# Patient Record
Sex: Male | Born: 1950 | Race: White | Hispanic: No | Marital: Married | Smoking: Never smoker
Health system: Southern US, Community
[De-identification: ages and names within clinical notes are randomized; demographics above are authoritative.]

## PROBLEM LIST (undated history)

## (undated) DIAGNOSIS — E7220 Disorder of urea cycle metabolism, unspecified: Secondary | ICD-10-CM

## (undated) DIAGNOSIS — L03116 Cellulitis of left lower limb: Secondary | ICD-10-CM

## (undated) DIAGNOSIS — I219 Acute myocardial infarction, unspecified: Secondary | ICD-10-CM

## (undated) DIAGNOSIS — E079 Disorder of thyroid, unspecified: Secondary | ICD-10-CM

## (undated) DIAGNOSIS — I209 Angina pectoris, unspecified: Secondary | ICD-10-CM

## (undated) DIAGNOSIS — K7460 Unspecified cirrhosis of liver: Secondary | ICD-10-CM

## (undated) DIAGNOSIS — G4733 Obstructive sleep apnea (adult) (pediatric): Secondary | ICD-10-CM

## (undated) DIAGNOSIS — R161 Splenomegaly, not elsewhere classified: Secondary | ICD-10-CM

## (undated) DIAGNOSIS — R351 Nocturia: Secondary | ICD-10-CM

## (undated) DIAGNOSIS — R011 Cardiac murmur, unspecified: Secondary | ICD-10-CM

## (undated) DIAGNOSIS — I2510 Atherosclerotic heart disease of native coronary artery without angina pectoris: Secondary | ICD-10-CM

## (undated) DIAGNOSIS — D696 Thrombocytopenia, unspecified: Secondary | ICD-10-CM

## (undated) DIAGNOSIS — N183 Chronic kidney disease, stage 3 unspecified: Secondary | ICD-10-CM

## (undated) DIAGNOSIS — M1990 Unspecified osteoarthritis, unspecified site: Secondary | ICD-10-CM

## (undated) DIAGNOSIS — M549 Dorsalgia, unspecified: Secondary | ICD-10-CM

## (undated) DIAGNOSIS — E871 Hypo-osmolality and hyponatremia: Secondary | ICD-10-CM

## (undated) DIAGNOSIS — I10 Essential (primary) hypertension: Secondary | ICD-10-CM

## (undated) DIAGNOSIS — E119 Type 2 diabetes mellitus without complications: Secondary | ICD-10-CM

## (undated) DIAGNOSIS — I509 Heart failure, unspecified: Secondary | ICD-10-CM

## (undated) DIAGNOSIS — E875 Hyperkalemia: Secondary | ICD-10-CM

## (undated) DIAGNOSIS — R799 Abnormal finding of blood chemistry, unspecified: Secondary | ICD-10-CM

## (undated) DIAGNOSIS — R7989 Other specified abnormal findings of blood chemistry: Secondary | ICD-10-CM

## (undated) DIAGNOSIS — K760 Fatty (change of) liver, not elsewhere classified: Secondary | ICD-10-CM

## (undated) HISTORY — DX: Unspecified cirrhosis of liver: K74.60

## (undated) HISTORY — DX: Splenomegaly, not elsewhere classified: R16.1

## (undated) HISTORY — DX: Type 2 diabetes mellitus without complications: E11.9

## (undated) HISTORY — PX: JOINT REPLACEMENT: SHX530

## (undated) HISTORY — DX: Fatty (change of) liver, not elsewhere classified: K76.0

## (undated) HISTORY — DX: Disorder of thyroid, unspecified: E07.9

## (undated) HISTORY — DX: Cellulitis of left lower limb: L03.116

## (undated) HISTORY — DX: Other specified abnormal findings of blood chemistry: R79.89

## (undated) HISTORY — DX: Obstructive sleep apnea (adult) (pediatric): G47.33

## (undated) HISTORY — DX: Nocturia: R35.1

## (undated) HISTORY — DX: Unspecified osteoarthritis, unspecified site: M19.90

## (undated) HISTORY — DX: Essential (primary) hypertension: I10

## (undated) HISTORY — DX: Disorder of urea cycle metabolism, unspecified: E72.20

## (undated) HISTORY — DX: Atherosclerotic heart disease of native coronary artery without angina pectoris: I25.10

## (undated) HISTORY — DX: Abnormal finding of blood chemistry, unspecified: R79.9

## (undated) HISTORY — DX: Angina pectoris, unspecified: I20.9

## (undated) HISTORY — DX: Thrombocytopenia, unspecified: D69.6

## (undated) HISTORY — DX: Morbid (severe) obesity due to excess calories: E66.01

## (undated) HISTORY — PX: CORONARY STENT PLACEMENT: SHX1402

## (undated) HISTORY — DX: Acute myocardial infarction, unspecified: I21.9

## (undated) HISTORY — DX: Heart failure, unspecified: I50.9

---
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
## 1999-04-07 ENCOUNTER — Ambulatory Visit (HOSPITAL_BASED_OUTPATIENT_CLINIC_OR_DEPARTMENT_OTHER): Admission: RE | Admit: 1999-04-07 | Discharge: 1999-04-07 | Payer: Self-pay | Admitting: Orthopedic Surgery

## 2003-04-10 ENCOUNTER — Encounter: Payer: Self-pay | Admitting: Pulmonary Disease

## 2003-05-15 ENCOUNTER — Encounter: Payer: Self-pay | Admitting: Pulmonary Disease

## 2005-03-15 ENCOUNTER — Ambulatory Visit (HOSPITAL_COMMUNITY): Admission: RE | Admit: 2005-03-15 | Discharge: 2005-03-15 | Payer: Self-pay | Admitting: General Practice

## 2007-11-26 ENCOUNTER — Ambulatory Visit: Payer: Self-pay | Admitting: Pulmonary Disease

## 2007-11-26 DIAGNOSIS — G4733 Obstructive sleep apnea (adult) (pediatric): Secondary | ICD-10-CM | POA: Insufficient documentation

## 2007-11-26 DIAGNOSIS — E119 Type 2 diabetes mellitus without complications: Secondary | ICD-10-CM

## 2007-11-26 DIAGNOSIS — I10 Essential (primary) hypertension: Secondary | ICD-10-CM | POA: Insufficient documentation

## 2008-11-24 ENCOUNTER — Ambulatory Visit: Payer: Self-pay | Admitting: Pulmonary Disease

## 2010-03-30 ENCOUNTER — Encounter: Admission: RE | Admit: 2010-03-30 | Discharge: 2010-03-30 | Payer: Self-pay | Admitting: Internal Medicine

## 2010-11-28 ENCOUNTER — Emergency Department (HOSPITAL_COMMUNITY)
Admission: EM | Admit: 2010-11-28 | Discharge: 2010-11-28 | Payer: Self-pay | Source: Home / Self Care | Admitting: Emergency Medicine

## 2011-02-07 LAB — GLUCOSE, CAPILLARY

## 2011-03-01 ENCOUNTER — Ambulatory Visit: Payer: Self-pay | Admitting: Dietician

## 2011-03-09 ENCOUNTER — Encounter: Payer: 59 | Attending: Internal Medicine | Admitting: Dietician

## 2011-03-09 DIAGNOSIS — E119 Type 2 diabetes mellitus without complications: Secondary | ICD-10-CM | POA: Insufficient documentation

## 2011-03-09 DIAGNOSIS — Z713 Dietary counseling and surveillance: Secondary | ICD-10-CM | POA: Insufficient documentation

## 2011-05-03 ENCOUNTER — Ambulatory Visit: Payer: 59 | Admitting: Dietician

## 2011-05-27 ENCOUNTER — Emergency Department (HOSPITAL_COMMUNITY): Payer: 59

## 2011-05-27 ENCOUNTER — Inpatient Hospital Stay (HOSPITAL_COMMUNITY): Payer: 59

## 2011-05-27 ENCOUNTER — Other Ambulatory Visit: Payer: Self-pay | Admitting: Internal Medicine

## 2011-05-27 ENCOUNTER — Inpatient Hospital Stay (HOSPITAL_COMMUNITY)
Admission: EM | Admit: 2011-05-27 | Discharge: 2011-06-01 | DRG: 682 | Disposition: A | Discharge status: Home Health | Payer: 59 | Attending: Internal Medicine | Admitting: Internal Medicine

## 2011-05-27 DIAGNOSIS — F329 Major depressive disorder, single episode, unspecified: Secondary | ICD-10-CM | POA: Diagnosis present

## 2011-05-27 DIAGNOSIS — K7290 Hepatic failure, unspecified without coma: Secondary | ICD-10-CM | POA: Diagnosis not present

## 2011-05-27 DIAGNOSIS — E119 Type 2 diabetes mellitus without complications: Secondary | ICD-10-CM | POA: Diagnosis present

## 2011-05-27 DIAGNOSIS — I10 Essential (primary) hypertension: Secondary | ICD-10-CM | POA: Diagnosis present

## 2011-05-27 DIAGNOSIS — G4733 Obstructive sleep apnea (adult) (pediatric): Secondary | ICD-10-CM | POA: Diagnosis present

## 2011-05-27 DIAGNOSIS — E039 Hypothyroidism, unspecified: Secondary | ICD-10-CM | POA: Diagnosis present

## 2011-05-27 DIAGNOSIS — B372 Candidiasis of skin and nail: Secondary | ICD-10-CM | POA: Diagnosis present

## 2011-05-27 DIAGNOSIS — I359 Nonrheumatic aortic valve disorder, unspecified: Secondary | ICD-10-CM | POA: Diagnosis present

## 2011-05-27 DIAGNOSIS — I509 Heart failure, unspecified: Secondary | ICD-10-CM | POA: Diagnosis present

## 2011-05-27 DIAGNOSIS — R0989 Other specified symptoms and signs involving the circulatory and respiratory systems: Secondary | ICD-10-CM | POA: Diagnosis not present

## 2011-05-27 DIAGNOSIS — Z6841 Body Mass Index (BMI) 40.0 and over, adult: Secondary | ICD-10-CM

## 2011-05-27 DIAGNOSIS — Z794 Long term (current) use of insulin: Secondary | ICD-10-CM

## 2011-05-27 DIAGNOSIS — N179 Acute kidney failure, unspecified: Principal | ICD-10-CM | POA: Diagnosis present

## 2011-05-27 DIAGNOSIS — D61818 Other pancytopenia: Secondary | ICD-10-CM | POA: Diagnosis present

## 2011-05-27 DIAGNOSIS — M1990 Unspecified osteoarthritis, unspecified site: Secondary | ICD-10-CM | POA: Diagnosis present

## 2011-05-27 DIAGNOSIS — R0609 Other forms of dyspnea: Secondary | ICD-10-CM | POA: Diagnosis not present

## 2011-05-27 DIAGNOSIS — K7460 Unspecified cirrhosis of liver: Secondary | ICD-10-CM | POA: Diagnosis present

## 2011-05-27 DIAGNOSIS — I5030 Unspecified diastolic (congestive) heart failure: Secondary | ICD-10-CM | POA: Diagnosis present

## 2011-05-27 DIAGNOSIS — K7689 Other specified diseases of liver: Secondary | ICD-10-CM | POA: Diagnosis present

## 2011-05-27 DIAGNOSIS — E785 Hyperlipidemia, unspecified: Secondary | ICD-10-CM | POA: Diagnosis present

## 2011-05-27 DIAGNOSIS — F3289 Other specified depressive episodes: Secondary | ICD-10-CM | POA: Diagnosis present

## 2011-05-27 DIAGNOSIS — K7682 Hepatic encephalopathy: Secondary | ICD-10-CM | POA: Diagnosis not present

## 2011-05-27 DIAGNOSIS — E875 Hyperkalemia: Secondary | ICD-10-CM | POA: Diagnosis present

## 2011-05-27 LAB — URINALYSIS, MICROSCOPIC ONLY
Hgb urine dipstick: NEGATIVE
Ketones, ur: NEGATIVE mg/dL
Leukocytes, UA: NEGATIVE
Protein, ur: NEGATIVE mg/dL
Specific Gravity, Urine: 1.011 (ref 1.005–1.030)
Urobilinogen, UA: 0.2 mg/dL (ref 0.0–1.0)
pH: 6.5 (ref 5.0–8.0)

## 2011-05-27 LAB — GLUCOSE, CAPILLARY: Glucose-Capillary: 192 mg/dL — ABNORMAL HIGH (ref 70–99)

## 2011-05-27 LAB — TYPE AND SCREEN: Antibody Screen: NEGATIVE

## 2011-05-27 LAB — CARDIAC PANEL(CRET KIN+CKTOT+MB+TROPI): Troponin I: <0.3 ng/mL (ref <0.30)

## 2011-05-27 LAB — CBC
HCT: 28.5 % — ABNORMAL LOW (ref 39.0–52.0)
Hemoglobin: 9.7 g/dL — ABNORMAL LOW (ref 13.0–17.0)
MCHC: 34 g/dL (ref 30.0–36.0)
MCV: 95 fL (ref 78.0–100.0)
WBC: 3.2 10*3/uL — ABNORMAL LOW (ref 4.0–10.5)

## 2011-05-27 LAB — COMPREHENSIVE METABOLIC PANEL
ALT: 25 U/L (ref 0–53)
AST: 44 U/L — ABNORMAL HIGH (ref 0–37)
Calcium: 10.1 mg/dL (ref 8.4–10.5)
Creatinine, Ser: 3.42 mg/dL — ABNORMAL HIGH (ref 0.50–1.35)
Glucose, Bld: 137 mg/dL — ABNORMAL HIGH (ref 70–99)
Sodium: 136 mEq/L (ref 135–145)
Total Protein: 6.7 g/dL (ref 6.0–8.3)

## 2011-05-27 LAB — DIFFERENTIAL
Basophils Relative: 1 % (ref 0–1)
Monocytes Absolute: 0.2 10*3/uL (ref 0.1–1.0)
Neutro Abs: 2.3 10*3/uL (ref 1.7–7.7)

## 2011-05-27 LAB — TROPONIN I: Troponin I: <0.3 ng/mL (ref <0.30)

## 2011-05-27 LAB — CREATININE, URINE, RANDOM: Creatinine, Urine: 45.27 mg/dL

## 2011-05-27 LAB — SODIUM, URINE, RANDOM: Sodium, Ur: 100 mEq/L

## 2011-05-28 ENCOUNTER — Inpatient Hospital Stay (HOSPITAL_COMMUNITY): Payer: 59

## 2011-05-28 DIAGNOSIS — R945 Abnormal results of liver function studies: Secondary | ICD-10-CM

## 2011-05-28 DIAGNOSIS — D696 Thrombocytopenia, unspecified: Secondary | ICD-10-CM

## 2011-05-28 DIAGNOSIS — J9600 Acute respiratory failure, unspecified whether with hypoxia or hypercapnia: Secondary | ICD-10-CM

## 2011-05-28 DIAGNOSIS — I359 Nonrheumatic aortic valve disorder, unspecified: Secondary | ICD-10-CM

## 2011-05-28 DIAGNOSIS — R4182 Altered mental status, unspecified: Secondary | ICD-10-CM

## 2011-05-28 LAB — CBC
HCT: 29.4 % — ABNORMAL LOW (ref 39.0–52.0)
HCT: 29.2 % — ABNORMAL LOW (ref 39.0–52.0)
Hemoglobin: 10.2 g/dL — ABNORMAL LOW (ref 13.0–17.0)
Hemoglobin: 10.1 g/dL — ABNORMAL LOW (ref 13.0–17.0)
MCH: 32.4 pg (ref 26.0–34.0)
MCHC: 34.6 g/dL (ref 30.0–36.0)
RBC: 3.11 MIL/uL — ABNORMAL LOW (ref 4.22–5.81)
RDW: 13.5 % (ref 11.5–15.5)
WBC: 4.3 10*3/uL (ref 4.0–10.5)

## 2011-05-28 LAB — COMPREHENSIVE METABOLIC PANEL
AST: 44 U/L — ABNORMAL HIGH (ref 0–37)
BUN: 92 mg/dL — ABNORMAL HIGH (ref 6–23)
CO2: 29 mEq/L (ref 19–32)
Calcium: 10.2 mg/dL (ref 8.4–10.5)
Chloride: 102 mEq/L (ref 96–112)
Creatinine, Ser: 2.62 mg/dL — ABNORMAL HIGH (ref 0.50–1.35)
GFR calc Af Amer: 30 mL/min — ABNORMAL LOW (ref >60)
GFR calc non Af Amer: 25 mL/min — ABNORMAL LOW (ref >60)
Total Bilirubin: 1.5 mg/dL — ABNORMAL HIGH (ref 0.3–1.2)

## 2011-05-28 LAB — BASIC METABOLIC PANEL
BUN: 102 mg/dL — ABNORMAL HIGH (ref 6–23)
CO2: 26 mEq/L (ref 19–32)
Chloride: 101 mEq/L (ref 96–112)
Creatinine, Ser: 3.36 mg/dL — ABNORMAL HIGH (ref 0.50–1.35)
GFR calc Af Amer: 23 mL/min — ABNORMAL LOW (ref >60)
Glucose, Bld: 215 mg/dL — ABNORMAL HIGH (ref 70–99)

## 2011-05-28 LAB — LACTATE DEHYDROGENASE: LDH: 407 U/L — ABNORMAL HIGH (ref 94–250)

## 2011-05-28 LAB — HEMOGLOBIN A1C: Mean Plasma Glucose: 126 mg/dL — ABNORMAL HIGH (ref <117)

## 2011-05-28 LAB — RENAL FUNCTION PANEL
BUN: 101 mg/dL — ABNORMAL HIGH (ref 6–23)
CO2: 27 mEq/L (ref 19–32)
Chloride: 100 mEq/L (ref 96–112)
GFR calc Af Amer: 26 mL/min — ABNORMAL LOW (ref >60)
Glucose, Bld: 189 mg/dL — ABNORMAL HIGH (ref 70–99)
Potassium: 5.5 mEq/L — ABNORMAL HIGH (ref 3.5–5.1)

## 2011-05-28 LAB — CARDIAC PANEL(CRET KIN+CKTOT+MB+TROPI)
CK, MB: 6.4 ng/mL (ref 0.3–4.0)
Total CK: 400 U/L — ABNORMAL HIGH (ref 7–232)
Troponin I: <0.3 ng/mL (ref <0.30)

## 2011-05-28 LAB — DIC (DISSEMINATED INTRAVASCULAR COAGULATION)PANEL
D-Dimer, Quant: 5.33 ug/mL-FEU — ABNORMAL HIGH (ref 0.00–0.48)
Fibrinogen: 238 mg/dL (ref 204–475)
Prothrombin Time: 19.1 seconds — ABNORMAL HIGH (ref 11.6–15.2)
aPTT: 43 seconds — ABNORMAL HIGH (ref 24–37)

## 2011-05-28 LAB — BLOOD GAS, ARTERIAL
Drawn by: 32131
O2 Content: 2.5 L/min
O2 Saturation: 98.5 %
pCO2 arterial: 35.6 mmHg (ref 35.0–45.0)
pO2, Arterial: 103 mmHg — ABNORMAL HIGH (ref 80.0–100.0)

## 2011-05-28 LAB — DIFFERENTIAL
Eosinophils Relative: 1 % (ref 0–5)
Lymphs Abs: 0.4 10*3/uL — ABNORMAL LOW (ref 0.7–4.0)
Monocytes Absolute: 0.4 10*3/uL (ref 0.1–1.0)
Monocytes Relative: 7 % (ref 3–12)
Neutro Abs: 5.2 10*3/uL (ref 1.7–7.7)
WBC Morphology: INCREASED

## 2011-05-28 LAB — FERRITIN: Ferritin: 80 ng/mL (ref 22–322)

## 2011-05-28 LAB — GLUCOSE, CAPILLARY
Glucose-Capillary: 160 mg/dL — ABNORMAL HIGH (ref 70–99)
Glucose-Capillary: 140 mg/dL — ABNORMAL HIGH (ref 70–99)
Glucose-Capillary: 158 mg/dL — ABNORMAL HIGH (ref 70–99)
Glucose-Capillary: 162 mg/dL — ABNORMAL HIGH (ref 70–99)

## 2011-05-28 LAB — AMMONIA: Ammonia: 83 umol/L — ABNORMAL HIGH (ref 11–60)

## 2011-05-28 LAB — C4 COMPLEMENT: Complement C4, Body Fluid: 24 mg/dL (ref 10–40)

## 2011-05-28 LAB — RETICULOCYTES: Retic Count, Absolute: 73.3 10*3/uL (ref 19.0–186.0)

## 2011-05-28 LAB — MAGNESIUM: Magnesium: 2.8 mg/dL — ABNORMAL HIGH (ref 1.5–2.5)

## 2011-05-28 LAB — IRON AND TIBC: Iron: 28 ug/dL — ABNORMAL LOW (ref 42–135)

## 2011-05-29 LAB — DIFFERENTIAL
Basophils Relative: 0 % (ref 0–1)
Eosinophils Relative: 2 % (ref 0–5)
Lymphs Abs: 0.6 10*3/uL — ABNORMAL LOW (ref 0.7–4.0)
Monocytes Absolute: 0.5 10*3/uL (ref 0.1–1.0)
Monocytes Relative: 9 % (ref 3–12)
Neutro Abs: 4 10*3/uL (ref 1.7–7.7)

## 2011-05-29 LAB — CBC
HCT: 27.7 % — ABNORMAL LOW (ref 39.0–52.0)
Hemoglobin: 9.7 g/dL — ABNORMAL LOW (ref 13.0–17.0)
MCH: 32.7 pg (ref 26.0–34.0)
MCHC: 35 g/dL (ref 30.0–36.0)
MCV: 93.3 fL (ref 78.0–100.0)
RDW: 13.7 % (ref 11.5–15.5)

## 2011-05-29 LAB — DIC (DISSEMINATED INTRAVASCULAR COAGULATION)PANEL
Fibrinogen: 377 mg/dL (ref 204–475)
Platelets: 95 10*3/uL — ABNORMAL LOW (ref 150–400)
Smear Review: NONE SEEN
aPTT: 34 seconds (ref 24–37)

## 2011-05-29 LAB — HIV ANTIBODY (ROUTINE TESTING W REFLEX): HIV: NONREACTIVE

## 2011-05-29 LAB — COMPREHENSIVE METABOLIC PANEL
Albumin: 3 g/dL — ABNORMAL LOW (ref 3.5–5.2)
Alkaline Phosphatase: 42 U/L (ref 39–117)
BUN: 81 mg/dL — ABNORMAL HIGH (ref 6–23)
CO2: 28 mEq/L (ref 19–32)
Chloride: 102 mEq/L (ref 96–112)
Creatinine, Ser: 1.95 mg/dL — ABNORMAL HIGH (ref 0.50–1.35)
GFR calc non Af Amer: 35 mL/min — ABNORMAL LOW (ref >60)
Potassium: 4 mEq/L (ref 3.5–5.1)
Total Bilirubin: 1.7 mg/dL — ABNORMAL HIGH (ref 0.3–1.2)

## 2011-05-29 LAB — HEPATITIS PANEL, ACUTE
HCV Ab: NEGATIVE
Hep A IgM: NEGATIVE

## 2011-05-29 LAB — RETICULOCYTES: RBC.: 2.97 MIL/uL — ABNORMAL LOW (ref 4.22–5.81)

## 2011-05-29 LAB — LACTATE DEHYDROGENASE: LDH: 478 U/L — ABNORMAL HIGH (ref 94–250)

## 2011-05-29 LAB — GLUCOSE, CAPILLARY: Glucose-Capillary: 176 mg/dL — ABNORMAL HIGH (ref 70–99)

## 2011-05-30 LAB — TECHNOLOGIST SMEAR REVIEW

## 2011-05-30 LAB — DIFFERENTIAL
Basophils Absolute: 0 10*3/uL (ref 0.0–0.1)
Basophils Relative: 1 % (ref 0–1)
Lymphocytes Relative: 17 % (ref 12–46)
Neutro Abs: 3.1 10*3/uL (ref 1.7–7.7)
Neutrophils Relative %: 70 % (ref 43–77)

## 2011-05-30 LAB — COMPREHENSIVE METABOLIC PANEL
ALT: 21 U/L (ref 0–53)
AST: 31 U/L (ref 0–37)
Albumin: 3 g/dL — ABNORMAL LOW (ref 3.5–5.2)
Calcium: 9.5 mg/dL (ref 8.4–10.5)
GFR calc Af Amer: 42 mL/min — ABNORMAL LOW (ref >60)
Sodium: 146 mEq/L — ABNORMAL HIGH (ref 135–145)
Total Protein: 6.7 g/dL (ref 6.0–8.3)

## 2011-05-30 LAB — RETICULOCYTES
RBC.: 3.04 MIL/uL — ABNORMAL LOW (ref 4.22–5.81)
Retic Count, Absolute: 66.9 10*3/uL (ref 19.0–186.0)
Retic Ct Pct: 2.2 % (ref 0.4–3.1)

## 2011-05-30 LAB — ANA: Anti Nuclear Antibody(ANA): NEGATIVE

## 2011-05-30 LAB — AMMONIA: Ammonia: 71 umol/L — ABNORMAL HIGH (ref 11–60)

## 2011-05-30 LAB — GLUCOSE, CAPILLARY
Glucose-Capillary: 150 mg/dL — ABNORMAL HIGH (ref 70–99)
Glucose-Capillary: 194 mg/dL — ABNORMAL HIGH (ref 70–99)

## 2011-05-30 LAB — CBC
Hemoglobin: 9.9 g/dL — ABNORMAL LOW (ref 13.0–17.0)
RBC: 3.04 MIL/uL — ABNORMAL LOW (ref 4.22–5.81)

## 2011-05-30 LAB — LACTATE DEHYDROGENASE: LDH: 339 U/L — ABNORMAL HIGH (ref 94–250)

## 2011-05-31 LAB — PROTEIN ELECTROPH W RFLX QUANT IMMUNOGLOBULINS
Alpha-2-Globulin: 9.2 % (ref 7.1–11.8)
M-Spike, %: NOT DETECTED g/dL
Total Protein ELP: 6.3 g/dL (ref 6.0–8.3)

## 2011-05-31 LAB — IMMUNOFIXATION ADD-ON

## 2011-05-31 LAB — UIFE/LIGHT CHAINS/TP QN, 24-HR UR
Beta, Urine: DETECTED — AB
Free Lambda Lt Chains,Ur: 0.1 mg/dL (ref 0.02–0.67)
Free Lt Chn Excr Rate: 122.4 mg/d
Gamma Globulin, Urine: DETECTED — AB
Time: 24 hours
Volume, Urine: 12000 mL

## 2011-05-31 LAB — RETICULOCYTES: Retic Ct Pct: 2.4 % (ref 0.4–3.1)

## 2011-05-31 LAB — GLUCOSE, CAPILLARY
Glucose-Capillary: 198 mg/dL — ABNORMAL HIGH (ref 70–99)
Glucose-Capillary: 165 mg/dL — ABNORMAL HIGH (ref 70–99)

## 2011-05-31 LAB — CBC
HCT: 33.1 % — ABNORMAL LOW (ref 39.0–52.0)
Hemoglobin: 11.2 g/dL — ABNORMAL LOW (ref 13.0–17.0)
MCH: 32 pg (ref 26.0–34.0)
MCV: 94.6 fL (ref 78.0–100.0)
RBC: 3.5 MIL/uL — ABNORMAL LOW (ref 4.22–5.81)

## 2011-05-31 LAB — COMPREHENSIVE METABOLIC PANEL
ALT: 22 U/L (ref 0–53)
AST: 33 U/L (ref 0–37)
CO2: 35 mEq/L — ABNORMAL HIGH (ref 19–32)
Calcium: 9.4 mg/dL (ref 8.4–10.5)
Chloride: 104 mEq/L (ref 96–112)
GFR calc non Af Amer: 50 mL/min — ABNORMAL LOW (ref >60)
Sodium: 146 mEq/L — ABNORMAL HIGH (ref 135–145)

## 2011-05-31 LAB — AMMONIA: Ammonia: 62 umol/L — ABNORMAL HIGH (ref 11–60)

## 2011-05-31 LAB — DIFFERENTIAL
Lymphocytes Relative: 18 % (ref 12–46)
Lymphs Abs: 1.1 10*3/uL (ref 0.7–4.0)
Monocytes Relative: 10 % (ref 3–12)
Neutro Abs: 3.9 10*3/uL (ref 1.7–7.7)
Neutrophils Relative %: 66 % (ref 43–77)

## 2011-06-01 LAB — DIFFERENTIAL
Basophils Absolute: 0 10*3/uL (ref 0.0–0.1)
Basophils Relative: 1 % (ref 0–1)
Eosinophils Absolute: 0.4 10*3/uL (ref 0.0–0.7)
Eosinophils Relative: 7 % — ABNORMAL HIGH (ref 0–5)
Monocytes Absolute: 0.6 10*3/uL (ref 0.1–1.0)

## 2011-06-01 LAB — LACTATE DEHYDROGENASE: LDH: 353 U/L — ABNORMAL HIGH (ref 94–250)

## 2011-06-01 LAB — CBC
MCHC: 34.2 g/dL (ref 30.0–36.0)
RDW: 13.3 % (ref 11.5–15.5)

## 2011-06-01 LAB — IGG, IGA, IGM: IgG (Immunoglobin G), Serum: 1650 mg/dL — ABNORMAL HIGH (ref 650–1600)

## 2011-06-01 LAB — COMPREHENSIVE METABOLIC PANEL
Albumin: 2.9 g/dL — ABNORMAL LOW (ref 3.5–5.2)
BUN: 26 mg/dL — ABNORMAL HIGH (ref 6–23)
CO2: 32 mEq/L (ref 19–32)
Calcium: 9.1 mg/dL (ref 8.4–10.5)
Chloride: 102 mEq/L (ref 96–112)
Creatinine, Ser: 1.14 mg/dL (ref 0.50–1.35)
GFR calc non Af Amer: >60 mL/min (ref >60)
Total Bilirubin: 1.3 mg/dL — ABNORMAL HIGH (ref 0.3–1.2)

## 2011-06-01 LAB — AMMONIA: Ammonia: 89 umol/L — ABNORMAL HIGH (ref 11–60)

## 2011-06-01 LAB — RETICULOCYTES: RBC.: 3.53 MIL/uL — ABNORMAL LOW (ref 4.22–5.81)

## 2011-06-03 NOTE — Discharge Summary (Signed)
[name], [name] NO.:  [id number]  MEDICAL RECORD NO.:  [id number]  LOCATION:  2013                         FACILITY:  [facility]  PHYSICIAN:  [name], M.D.DATE OF BIRTH:  March 22, 1951  DATE OF ADMISSION:  05/27/2011 DATE OF DISCHARGE:  06/01/2011                              DISCHARGE SUMMARY   PRIMARY CARE DOCTOR:  Collin Spikes, DO  DISCHARGE DIAGNOSES: 1. Acute metabolic encephalopathy probably secondary to cirrhosis. 2. 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 3. Acute kidney injury. 4. Morbid obesity. 5. Anasarca. 6. Pancytopenia. 7. Mild aortic stenosis. 8. Fungal skin infection, Candidiasis.  DISCHARGE MEDICATIONS: 1. Lactulose 30 mL q.12 h. 2. Nystatin topical t.i.d. 3. K-Dur 20 mEq p.o. b.i.d. 4. Lasix 80 mg b.i.d. 5. Levemir 25 units at bedtime. 6. Cyclobenzaprine 10 mg daily as needed. 7. ________ 10 mg daily. 8. Hydrocodone 5/325 mg 1 tablet every hour. 9. Synthroid 15 mcg daily. 10.Stop lisinopril. 11.Multivitamin one tablet daily. 12.NovoLog 12 units t.i.d. 13.Ambien 10 mg daily.  CONSULTANTS: 1. Dr. Sung [name], Pulmonary Critical Care. 2. Dr. Arline [name], hematologist. 3. Dr. Terrial [name], nephrologist.  PROCEDURES PERFORMED:  CT scan of the head that showed no acute intracranial abnormality.  CT scan of the abdomen and pelvis on May 28, 2011 showed bilateral pleural effusion, bilateral atelectasis, right greater than left, abdominal ascites, hepatic cirrhosis with splenomegaly indicating portal hypertension, distended gallbladder without bile duct dilation, no obstructive intrarenal stone.  Abdominal ultrasound is technically severed limited exam due to body habitus.  Chest x-ray showed cardiac enlargement, vascular congestion without overt edema, right-sided pleural effusion.  BRIEF ADMITTING HISTORY AND PHYSICAL:  This is a 60 year old gentleman who according his otherwise have been  progressively generalized malaise, shortness of breath over the last 3-week, the patient also was noted to have swelling of his leg which is now progressed up to the scrotum.  The patient went to see his primary care doctor, Dr. Bufford [name], did labs and found to be in acute renal failure.  The patient was asking to the emergency room for further evaluation.  The patient denies any fever or chills.  He denies any nausea or vomiting.  The patient denies any problems, have been feeling generally weak and increased short of breath.  The only other confounding was that he was started dexamethasone 4 months ago.  The patient appetite is decreased, although he was losing weight.  His wife reports she felt that he was gaining weight per taken around his mid section.  PHYSICAL EXAMINATION:  VITAL SIGNS:  Temperature 98, heart rate of 106, blood pressure 119/76, and she was satting 100% on 2 liters, breathing 20 times per minute in general. HEENT:  Normocephalic and atraumatic.  Anicteric but with mild pallor of conjunctiva.  Oropharynx is moist.  No erythema or exudate. NECK:  The patient's very sick neck cannot appreciate. LUNGS:  Diminished sounds with breath sounds throughout.  There is no crackles or rales. CARDIOVASCULAR:  Regular rate S1 and S2 with 2-3/6 systolic ejection murmur best heard in the axilla radiating to her bilateral carotids. ABDOMEN:  Soft, nontender, nondistended, normoactive bowel sounds. EXTREMITIES:  Edema 3 plus without anasarca,  enlarged scrotum, cannot see penis. LYMPHADENOPATHY:  Nonpalpable. NEURO:  Nonfocal. PSYCHIATRY:  Good insight and cognition.  Two-D echo that showed left cavity size was normal with an ejection fraction of 50-60% consistent with pseudo normal relaxation, filling pattern concomitant of normal relaxation with a grade 2 diastolic heart failure, aortic valve showed valve area 1.12 cm2 and a valve area by velocity 1.19 cm2, left atrium was  dilated, left great ventricle mildly dilated and no appreciated PA pressure.  BRIEF HOSPITAL COURSE: 1. Acute metabolic encephalopathy probably secondary to cirrhosis.     The patient initially had headache that related to me that he was     having flapping of the hands.  His ammonia level was checked which     was high.  He does have a little bit of cirrhosis.  This resolved     with lactulose. 2. Cirrhosis newly diagnosed.  GI was consulted.  They recommended a     CT scan of the abdomen and pelvis that showed splenomegaly and     cirrhosis.  Serologies for hepatitis and ANA are negative.  His BMI     was 43.  GI recommended to follow up as an outpatient.  And     probably get a biopsy there to rule out NASH. 3. Acute kidney injury probably secondary to the use of NSAIDs and     ACE.  Nephrology was consulted.  He was diuresed aggressively and     his renal function improved. 4. Morbid obesity.  Counseling was done. 5. Anasarca probably multifactorial secondary to diastolic heart     failure.  Use of indomethacin and lisinopril.  The patient diuresed      over 30 L.  His swelling came down significantly.  He will follow      up with GI as a probable cause contrin=buting to his anasarca     is liver cirrhosis.  He will also follow up with     Dr. Renato [name], followup on his diastolic heart failure. 6. Pancytopenia.  Hematology was consulted for the concern of TTP.     There was no schistocytes.  Hematology thought TTP issue was     unlikely.  He will follow up with them as an outpatient. 7. Mild aortic stenosis, currently stable.  No changes were made. 8. Candidiasis skin.  He will use nystatin powder for 7 days. 9. Diastolic heart failure probably contributing to his anasarca.  His     beta-blockers were stopped because of his heart rate of 60.  His     ACE was stopped because of his acute kidney injury.  He will follow     up with Dr. Renato [name], who was titrate and start ACE as needed.  He  will     also continue on Lasix 80 twice a day.  DISPOSITION:  The patient will follow up with Dr. Renato [name] in 2 weeks here. We will see how his blood pressures doing.  We will consider add an ACE at that time if his blood pressure can tolerate it.  But at this time his ACE has been on hold secondary to his borderline blood pressure.  We have also held his beta-blocker as his heart rate remained 60.  He also at this time will follow up on the labs pending.  The patient will also follow up with Dr. Perry Mount on August 14 to follow up on his labs and possible biopsy of the liver at that time.  Vitals on  day of discharge show temperature 98, pulse of 60, respirations 18, blood pressure 100/70, and he was satting 96% on room air.  Labs on day of discharge shows ammonia level is 89.  His LDH is 553. His sodium is 142, potassium 3.3, this was repleted, chloride of 102, bicarb of 32, glucose of 172, BUN of 26, bilirubin of 1.3, and albumin of 2.9.     [name], M.D.     AF/MEDQ  D:  06/01/2011  T:  06/01/2011  Job:  [id number]  cc:   Collin Spikes, DO Rachael Fee, MD  Electronically Signed by [name] M.D. on 06/03/2011 02:26:05 PM

## 2011-06-04 LAB — CULTURE, BLOOD (ROUTINE X 2): Culture: NO GROWTH

## 2011-06-06 LAB — MISCELLANEOUS TEST

## 2011-06-08 NOTE — Consult Note (Signed)
NAMEIZYAN, EZZELL NO.:  [id number]  MEDICAL RECORD NO.:  [id number]  LOCATION:  4734                         FACILITY:  [facility]  PHYSICIAN:  Terrial Rhodes, M.D.DATE OF BIRTH:  03/18/1951  DATE OF CONSULTATION:  05/27/2011 DATE OF DISCHARGE:                                CONSULTATION   CONSULTING PHYSICIAN:  Marcelino Duster A. Ashley Royalty, MD  REASON FOR CONSULTATION:  Hyperkalemic acute renal failure.  HISTORY OF PRESENT ILLNESS:  Mr. Romney is a 60 year old white male with multiple medical problems, most notable for poorly controlled diabetes, hypertension, obesity, obstructive sleep apnea, and degenerative joint disease who has had increasing shortness of breath, lower extremity edema, malaise, fatigue and weakness over the last month.  He has not been feeling well for full last 4 weeks, but his symptoms really started 2 months ago when he was started on indomethacin for hip pain, which was felt to be due to degenerative joint disease.  He had been taking ibuprofen or Vicoprofen (Vicodin plus ibuprofen) which has been controlling his pain, but when he switched to the indomethacin, he has been taking 50 mg twice daily without significant improvement, but he has noticed increasing lower extremity edema as well as scrotal edema as well as the other symptoms noted above.  He was seen by his primary care physician at [facility] today with these symptoms and labs were significant for a BUN of 104, creatinine of 3.13, potassium of 5.5, and a hemoglobin of 9.9.  He was then admitted to [facility] for further evaluation and management of his anasarca, acute renal failure, and anemia.  Of note, he has been taking indomethacin 50 mg twice daily for the last few months as well as lisinopril 10 mg a day.  He was recently started on Lasix due to lower extremity edema and his hemoglobin has not been checked, so he do not have baseline.  His creatinine was 0.73 on  February 15, 2011.  He denies any hematochezia, melena, or bright red blood per rectum.  No nausea, vomiting, or diarrhea; just mainly the shortness of breath, dyspnea on exertion, malaise, fatigue, and scrotal edema.  ALLERGIES:  He has allergies to CRESTOR and XENICAL.  PAST MEDICAL HISTORY: 1. Insulin-requiring diabetes mellitus for 20 years. 2. Hypertension for 10 years. 3. Morbid obesity. 4. Obstructive sleep apnea. 5. Gout. 6. Degenerate joint disease. 7. Depressive disorder. 8. Hypertension. 9. Osteoarthritis. 10.Obstructive sleep apnea on CPAP. 11.Hyperlipidemia.  Outpatient medications: 1. Zolpidem 10 mg at bedtime. 2. Benazepril 10 mg daily p.r.n. 3. Lisinopril 10 mg daily. 4. Furosemide 80 mg daily. 5. Potassium chloride 20 mEq daily. 6. Levothyroxine 15 mcg daily. 7. Levemir 34 units at night subcu. 8. Glipizide XL 10 mg each morning. 9. Indomethacin 50 mg twice daily. 10.NovoLog 12 units before meals 3 times a day. 11.Vicodin 1 every 4 hours. 12.Multivitamin 1 a day. 13.Glucosamine chondroitin 1000 mg 3 times a day. 14.Vitamin D 2000 units daily.  FAMILY HISTORY:  Noncontributory.  SOCIAL HISTORY:  Lives at home with his wife.  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  Denies tobacco, alcohol, or drug use.  REVIEW OF SYSTEMS:  GENERAL:  He has had anorexia, malaise, fatigue, weakness.  CARDIAC:  No chest pain, palpitations but has had orthopnea, PND, and dyspnea on exertion.  No tachycardia or palpitations. PULMONARY:  He has had dyspnea on exertion.  No hemoptysis, productive cough.  GI:  No nausea, vomiting, hematochezia, melena, or bright red blood per rectum.  GU:  No dysuria, pyuria, hematuria, urgency, frequency, retention but has had scrotal edema that has been increasing over the last month.  RHEUMATOLOGIC:  Has chronic hip pain and has had increasing lower extremity edema  over the last month.  DERMATOLOGIC:  No rashes, lumps or bumps.  HEMATOLOGIC:  No abnormal bleeding or bruising. All other systems negative.  PHYSICAL EXAMINATION:  GENERAL:  He is a well-developed, obese man lying in bed in no apparent distress. VITAL SIGNS:  Temperature 97.7, pulse 96, blood pressure 138/86, respiratory rate is 17, pulse ox is 94% on 2 liters. HEENT:  Head normocephalic, atraumatic.  He is pale.  No icterus. Oropharynx without lesions. NECK:  Supple.  No lymphadenopathy, but he did have a referred transmitted murmur.  No bruits. LUNGS:  Diminished breath sounds, but no dullness to percussion.  No crackles. CARDIAC:  Regular rate and rhythm with a 3/6 systolic ejection murmur heard best around the precordium radiating to his axilla as well as to his carotids bilaterally. ABDOMEN:  Normoactive bowel sounds, soft, nontender.  He is obese.  No guarding or rebound. EXTREMITIES:  He has anasarca up to his umbilicus. NEUROLOGIC:  He has asterixis.  Cranial nerves II through XII are grossly intact.  Motor was slightly diminished, but equal and symmetric.  LABORATORY DATA:  Sodium 136, potassium 5.9, chloride 98, CO2 25, BUN 104, creatinine 3.42, glucose 137, calcium was 10.1, albumin 3.2, total protein 6.7.  Normal liver function tests.  CPK was 447, MB 7.1, relative index 1.6, troponin-I was less than 0.3.  His white blood cell count was 3.2, hemoglobin 9.7, platelets 67.  Renal ultrasound is pending.  Chest x-ray showed an enlarged heart, right-sided pleural effusion, and bibasilar atelectasis.  ASSESSMENT AND PLAN: 1. Acute kidney injury.  The patient's creatinine has tripled, but is     nonoliguric.  Has increasing lower extremity edema.  It is unclear     if he has any proteinuria.  No urinalysis has been performed as of     yet.  Differential diagnosis includes papillary necrosis and     diabetic, taking large amounts of nonsteroidals, also on the      differential would be a nephrotic syndrome with ischemic ATN,     possibly from NSAID-induced membranous versus minimal changed     disease.  Also on differential for glomerulonephritis would be     obesity-related FSG or possibly ATN from decompensated congestive     heart failure in the setting of ACE inhibitors and nonsteroidals.     Given his pancytopenia also on differential would be myeloma.  We     will also check for signs of proteinuria and hematuria as they are     present, we may want to rule out other vasculitis such as     Wegener's, Goodpasture's, also endocarditis given his murmur which     may be new.  In the meantime, we will start a workup and await the     urinalysis results to further expand on laboratory tests.  Also,  awaiting his ultrasound to rule out obstruction. 2. Anasarca.  He has a right pleural effusion and murmur.  This could     possibly be congestive heart failure from aortic stenosis.  We will     order a 2-D echo, continue to rule him out for MI, agree with IV     Lasix and we will follow. 3. Hyperkalemia secondary to acute kidney injury.  We will treat with     insulin and D50, Lasix, Kayexalate and follow his potassium, and     hopefully his renal function will continue to improve and he will     not need dialysis. 4. Pancytopenia as above.  We will check an SPEP and UPEP and consider     bone marrow biopsy. 5. Hypertension.  We will stop the ACE inhibitor, nonsteroidals given     his acute kidney injury and follow. 6. Degenerative joint disease.  We will treat with narcotics and avoid     nonsteroidals and COX-2 inhibitors. 7. Hypothyroidism.  Continue with Synthroid replacement. 8. Diabetes mellitus per his primary service.  We will continue to follow.  Thank you for this consultation.          ______________________________ Terrial Rhodes, M.D.     JC/MEDQ  D:  05/27/2011  T:  05/28/2011  Job:  [id number]  Electronically Signed by  Terrial Rhodes M.D. on 06/08/2011 07:15:34 PM

## 2011-06-15 ENCOUNTER — Encounter: Payer: 59 | Attending: Internal Medicine | Admitting: Dietician

## 2011-06-15 DIAGNOSIS — E669 Obesity, unspecified: Secondary | ICD-10-CM | POA: Insufficient documentation

## 2011-06-15 DIAGNOSIS — Z713 Dietary counseling and surveillance: Secondary | ICD-10-CM | POA: Insufficient documentation

## 2011-06-15 NOTE — Patient Instructions (Signed)
-  Check blood glucose in the mornings.  If you have any of the signs and symptoms of low blood glucose, you need to get a glucose reading and document and treat.  90-100 mg: have snack of fruit and protein(meat, cheese)  80 or less, follow the plan to treat with 15 grams of glucose (1/2 cup juice or glucose tablets) Cheese 3-5 gm of fat per serving.  [employer] [place].  50% and 75% Lower fat  In the cheese.   Remember the cheese has sodium in it. - Fat:  Use the leaner meats and rarely each day go into the medium fat meats.  Use the [employer] booklet for a reference. Calories:1400-1500/day Fat at 38-42 grams per day.  When reading labels, look to keep at the 2-3 gm per serving. Meals might be at 10 gms and that would leave 12 gms for snacks. -Has One-Touch Mini meter.  Need to get MD prescription for the strips.  Provided 50 strips.  Lot [id number] and expiration of 08/18/2011. Continue to monitor sodium levels.  Try to aim for 2000 mg of Sodium per day.   B: 500 mg  Lunch:  500 mg Dinner: 500 mg.  This would leave 500 mg for snacks.  Plan to follow-up in 4 weeks.

## 2011-06-15 NOTE — Consult Note (Signed)
[name], [name] NO.:  [id number]  MEDICAL RECORD NO.:  [id number]  LOCATION:  2913                         FACILITY:  [facility]  PHYSICIAN:  Oley Balm. Sung Amabile, MD   DATE OF BIRTH:  [birth year]  DATE OF CONSULTATION:  05/28/2011 DATE OF DISCHARGE:                                CONSULTATION   REQUESTING PHYSICIAN:  Altha Harm, MD  REASON FOR CONSULTATION:  Respiratory failure.  HISTORY OF PRESENT ILLNESS:  [name] is a 60 year old gentleman admitted to the [facility] on May 27, 2011, with a 3-week history of general malaise and increasing edema.  He was found in his primary care physician's office to have markedly elevated BUN and creatinine. On the day admission, he was seen by Dr. Arrie Aran of Renal Medicine and it was felt that his renal failure was on the basis of nonsteroidal use in the setting of ACE inhibitor therapy.  In the afternoon of May 28, 2011, he was noted to have markedly altered mental status and was transferred to the intensive care unit.  Critical Care Medicine was asked to evaluate him with concern that he might require intubation due to his depressed level of consciousness.  At the time of my evaluation, he is unable to provide any meaningful history.  The rest of the history is obtained from medical records.  PAST MEDICAL HISTORY: 1. Obstructive sleep apnea. 2. Type 2 diabetes. 3. Hypothyroidism. 4. Morbid obesity with a BMI of greater than 80. 5. Hypertension. 6. Chronic back pain.  SOCIAL HISTORY:  There is no documentation of prior smoking history. Records indicate that there is no history of abuse of alcohol, drugs, or prescription medications.  FAMILY HISTORY:  Noncontributory.  REVIEW OF SYSTEMS:  Otherwise unavailable.  CURRENT MEDICATIONS:  His home medications and medications since hospitalization have been reviewed and are documented in the medical reconciliation form.  PHYSICAL  EXAMINATION:  VITAL SIGNS:  She is afebrile with normal vital signs.  Oxygen saturation is 94% on 2 L by nasal cannula. GENERAL:  He is markedly obese and quite lethargic, but arouses to sternal rub.  He is poorly oriented to person, place, and time. HEENT:  No acute abnormalities.  Cranial nerves are intact. NECK:  Supple without adenopathy or jugular venous distention noted. CHEST:  No adventitious sounds anteriorly.  He is unable to sit up for auscultation of his posterior chest. CARDIAC:  Regular rate and rhythm with a 2/6 systolic ejection murmur heard best at the left lower sternal border. ABDOMEN:  Morbidly obese, soft, nontender with no palpable masses or organomegaly. EXTREMITIES:  3-4+ pitting edema and he also has general body edema with anasarca.  Distal pulses are full and capillary refill is normal.  DATA:  Chest x-ray reveals cardiomegaly with vascular congestion and a small right pleural effusion.  CBC is notable for a platelet count in the 16,000 range.  DIC panel is notable for a D-dimer of 5.33 with normal fibrinogen level.  LDH is elevated at 407.  Chemistries are notable for a BUN of 92 and a creatinine of 2.6.  On admission, he was hyperkalemic, but this has now resolved  with therapy.  Liver function tests are essentially normal.  Ammonia level is mildly elevated at 83. Arterial blood gas after transfer to the intensive care unit and just prior to this consultation revealed a pH of 7.50, pCO2 of 35.6, pO2 of 103 on 2.5 L per nasal cannula.  IMPRESSION: 1. Hospitalization for acute renal failure with profound volume     overload - evaluation undertaken by Renal Service. 2. Morbid obesity with diabetes and obstructive sleep apnea. 3. Altered mental status of unclear etiology - his clinical picture     appears to be some form of a toxic/metabolic encephalopathy.     However, there is no definite process to explain his rather sudden     change in mental status on  the day of this consultation.  He has     undergone a full evaluation by Dr. Ashley Royalty. 4. Respiratory compromise - at the time of my initial evaluation, he     was markedly lethargic and it was reasonable to consider that he     might require intubation for airway protection.  Approximately 1     hour subsequent to my initial evaluation, his level of     consciousness had improved dramatically, although he remained     mildly confused.  At this time, I do not believe that he requires     any ventilatory support, either noninvasive or invasive.  If he     does require intubation at some time in the future, it should     certainly be undertaken with great caution.  One should anticipate     that he will have a very difficult airway.  PLAN AND RECOMMENDATIONS:  I commend his thoughtful evaluation to date. I have nothing further to add to that with regard to the evaluation of his renal failure and encephalopathy.  At the present time, he does not require intubation or other services specific to the Critical Care Medicine Service.  We will check on him again on the day following this consultation to ensure that he continues on a path of improvement.   Oley Balm Sung Amabile, MD     DBS/MEDQ  D:  05/28/2011  T:  05/29/2011  Job:  [id number]  Electronically Signed by Billy Fischer MD on 06/15/2011 03:44:51 AM

## 2011-06-19 NOTE — Consult Note (Signed)
NAMEARIK, HUSMANN NO.:  [id number]  MEDICAL RECORD NO.:  [id number]  LOCATION:  2913                         FACILITY:  [facility]  PHYSICIAN:  Samul Dada, M.D.DATE OF BIRTH:  06/26/1951  DATE OF CONSULTATION:  05/28/2011 DATE OF DISCHARGE:                                CONSULTATION   HISTORY:  [name] is a 60 year old white married male whom I am asked to see in consultation by Dr. Marthann Schiller for evaluation of the possibility of TTP.  [name] is currently in Step Down Unit 2913 after having been transferred here from the routine floor because of a deterioration in his condition.  History is obtained primarily from Dr. Ashley Royalty and an excellent consultation note by Dr. Terrial Rhodes, as well as the patient's wife since the patient is at this point somewhat confused and disoriented.  The history as we have it as this gentleman has multiple medical problems that will be defined below, but apparently has been having signs of failing health over the past 3-4 weeks.  This has been characterized by increasing edema, considerable weight gain estimated to be approximately 30-50 pounds in this obese gentleman, fatigue, weakness.  The patient was seen on the day of admission, which was May 27, 2011, by his primary care physician.  He was noted to be in renal failure with a BUN of 104, creatinine 3.13, and a potassium of 5.5.  Additionally, he was slightly anemic and thrombocytopenic. History is significant in that the patient has been on indomethacin apparently for the last couple of months.  Also lisinopril and was started on Lasix.  Apparently, the patient has not had any fever, any evidence of GI bleeding or GI upset.  As stated, we are asked to see this gentleman because of anemia, thrombocytopenia, renal failure, confusion, and most recently an elevated LDH, all of which raises the concerns about possible TTP in this gentleman.  PAST MEDICAL  HISTORY:  Notable for insulin-requiring diabetes mellitus for 20 years, hypertension for 10 years, morbid obesity, obstructive sleep apnea, gout, degenerative joint disease, depression, hypertension, osteoarthritis, and dyslipidemia.  He was allergic to CRESTOR and XENICAL.  Medicines at the time of admission were as follows: 1. Ambien 10 mg at bedtime. 2. Benazepril 10 mg p.r.n. 3. Lisinopril 10 mg daily. 4. Lasix 80 mg daily. 5. Potassium chloride 20 mEq daily. 6. Levothyroxine 15 mcg daily. 7. Levemir 34 units at night subcu. 8. Glipizide XL 10 mg in the morning. 9. Indomethacin 50 mg twice daily. 10.NovoLog insulin 10 units before meals three times a day. 11.Vicodin one every 4 hours. 12.Multivitamins one a day. 13.Glucosamine/chondroitin sulfate 1000 mg 3 times a day. 14.Vitamin D 2000 units daily.  FAMILY HISTORY:  Significant for diabetes.  SOCIAL HISTORY:  The patient lives at home with his wife of 18 years. Her name is [name].  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  Apparently, there is no history of tobacco, alcohol, or drug use.  REVIEW OF SYSTEMS:  Essentially as stated in the consultation note of Dr. Arrie Aran.  The patient has had some generalized weakness, anorexia for the  past several weeks, but there is no history of any chest pain or palpitations.  He has had some dyspnea on exertion and orthopnea.  There has been no hemoptysis or productive cough.  No apparent GI symptoms. There has been some weight gain.  No urinary symptomatology.  He has chronic hip pain.  He has had massive edema of his legs and abdominal distention, probably due to ascites.  There have been no skin lesions. No prior history of a hematology problems.  PHYSICAL EXAMINATION:  GENERAL:  [name] is in the Step-Down Unit 2913. VITAL SIGNS:  He is on oxygen by mask, currently CPAP with an O2 saturation of 98%.  Previously, he was on 2 liters per minute  with an O2 saturation of 100.  Blood pressure 134/85.  He is afebrile.  Pulse is 103 and regular, respirations 24 and unlabored.  His weight is recorded at 163.3 kg. HEENT:  There is no scleral icterus.  Pupillary and extraocular movements are normal.  Mouth and pharynx are benign.  He there is no peripheral adenopathy palpable.  The patient is morbidly obese. LUNGS:  Anteriorly had some rhonchi. CARDIAC:  Regular rhythm with systolic ejection murmur. ABDOMEN:  Massively obese.  I cannot define any organomegaly or masses. I think he has ascites, but I cannot be sure.  He does have purpura over his abdomen related to insulin injections. EXTREMITIES:  Tense edema with some stasis changes.  The patient is disoriented.  Apparently he is more alert than he had been several hours before.  He kept repeating the day when he was asked what year it was. He was able to converse with his wife.  He is moving all extremities. No obvious skin rashes other than purpura.  LABORATORY DATA:  On admission, white count was 3.2, hemoglobin 9.7, hematocrit 28.5, platelets were 67,000.  Differential was normal with 72% neutrophils, 18% lymphs, 7% monocytes.  ANC was 2.3.  Red cell indices were normal.  His pro-BNP was 5616 with normal being 0-125.  His total creatine kinase was 447.  CK-MB was 7.1 and the relative index was 1.6.  Chemistries were notable for a sodium of 136, potassium of 5.9, BUN of 104, creatinine 3.42, glucose 137 for an estimated GFR of 18 mL per minute.  Bilirubin is 1.0, SGOT is slightly elevated at 44, total protein 6.7, albumin 3.2 for globulins of 3.5.  Calcium was 10.1.  That was on May 27, 2011.  Urinalysis was negative for protein, blood, ketones, and bilirubin.  Urine protein-to-creatinine ratio was 0.18, which is pretty close to normal.  C3 and C4 were normal.  Repeat CBC just after midnight at 12:30, white count was 4.3, hemoglobin 10.2, hematocrit 29.4, and platelets were  66,000.  Iron studies were most consistent with anemia of chronic disease.  The ferritin was 80.  Retic count was 2.2% with an absolute of 73.3 which is in the normal range. Ammonia level was increased at 83 with normal being 11-60.  Repeat chemistries on May 28, 2011, day of consultation, BUN 92, creatinine 2.62, representing some improvement, albumin was 1.5, SGOT again slightly elevated at 44, albumin 3.3, calcium 10.2.  LDH came back 407. DIC panel yielded platelet count of 80,000 at 14:17 hours.  Pro-time was 19.1 with an INR of 1.57 and a PTT was 43.  Fibrinogen is 238 at the low limits of normal with normal being 204-475.  D-dimer was 5.33, which is increased.  Most importantly, no schistocytes were seen on  my inspection of the peripheral smear as well as the lab tech's inspection. Haptoglobin was 62 which is in the normal range of 16-200.  CBC at basically 5:00 p.m. today, white count was 6.1, hemoglobin 10.1, hematocrit 29.2, and platelets are 83,000.  Neutrophils are 85%.  We have a portable chest x-ray from of the 29th, which shows cardiac enlargement, pulmonary vascular congestion without overt pulmonary edema.  There is a right-sided pleural effusion, bibasilar atelectasis. A CT of the head without contrast and ultrasound of the abdomen are pending.  IMPRESSION AND PLAN:  This patient has an acute illness, primarily characterized by renal failure, hepatic dysfunction as well as anemia, thrombocytopenia.  The question being raised is whether this patient could have but thrombotic thrombocytopenic purpura on the basis of the available data and the patient's clinical presentation, I think that thrombotic thrombocytopenic purpura at this time is unlikely, primarily because of the absence of schistocytes, evidence of hemolysis by peripheral smear, and the fact that the both retic count and haptoglobin are in the normal range.  I think that the patient's abnormalities certainly  could be explained by other aspects of his illness, which do not actually fitted with a diagnosis thrombotic thrombocytopenic purpura.  Specifically, he has got massive fluid retention and marked anasarca involving his abdominal wall, which I neglected to mention on the physical exam.  This patient may have 30-50 pounds of extra fluid. It remains to be seen whether he has ascites as well.  Possible etiologies for his renal and hepatic dysfunction could be secondary to drug toxicities in combination with his other medical problems.  The patient also has a mild coagulopathy at this time, which could be due to liver dysfunction or possibly some low-grade disseminated intravascular coagulation.  Again, we did not any schistocytes on today's smear.  Clearly, this patient needs to continued be monitored for any changes that might give [place] a different clinical impression.  We can send off the ADAMTS13 enzyme which probably will not be available for several days again looking for further prove of thrombotic thrombocytopenic purpura. I would suggest an abdominal ultrasound ANA, HIV.  The patient I believe is going to have a CT of the brain without IV contrast.  At this time, it would appear most likely that he has a metabolic encephalopathy. Unfortunately, the patient will not fit in an MRI.  Protein studies on the serum and urine to look for any evidence of multiple myeloma are also pending at this time.     Samul Dada, M.D.     DSM/MEDQ  D:  05/28/2011  T:  05/29/2011  Job:  [id number]  cc:   Altha Harm, MD Samul Dada, M.D. Terrial Rhodes, M.D.  Electronically Signed by Kimberlee Nearing M.D. on 06/19/2011 10:09:05 PM

## 2011-06-21 ENCOUNTER — Encounter: Payer: Self-pay | Admitting: Dietician

## 2011-06-21 NOTE — Progress Notes (Signed)
Medical Nutrition Therapy:  Appt start time: 1500 end time:  1330.  MEDICATIONS Review of medications reveals multiple changes due to CHF, kidney and liver failure issues of the last 4-6 weeks.  Assessment:  Primary concerns today: Blood sugar control,fluid issues, congestive heart failure and edema along with protein needs and liver/ammonia issues.  24-hr dietary recall: B (6:30 AM)- 1 cup strawberries, 2 plums and an English muffin or 2 eggs, grits with cheese and a [place] sausage patty or Kashi cereal and milk; ; L (11:30-12:00 PM)-chicken salad, or pimento cheese, celery, sun chips, or a frozen entree that is lower in sodium or leftovers from the previous evening meal; D (5:30-6>00 PM)- shrimp creole or [place] spaghetti with salad or a frozen entree; Snk (9:00 PM)-Sun chips or fruit.   Generally describes nutrient intake as better.  This is due to an intake of less salt and smaller portions. Blood glucose Monitoring: Monitoring fasting.  Ranges 130's -140's fasting.  Recent physical activity: Continues to be limited.  Using a cane.  Progress Towards Goal(s):  Some progress.   Nutritional Diagnosis:  Gould-2.1 Inpaired nutrition utilization As related to glucose and sodium.  As evidenced by increased fasting blood glucose levels, recent episode of CHF.    Intervention:  Nutrition: Review of ways to limit carbohydrate intake, brief review of food labels.  Incorporated counseling regarding sodium sources and methods to limit sodium and salt in the diet.  Monitoring/Evaluation:  Dietary intake, exercise, blood glucose levels, and body weight and edema levels Follow-up in 4 weeks.  Will need to continue to support him in decreasing carbs and sodium in his diet.

## 2011-06-23 NOTE — H&P (Signed)
NAMESAIFAN, [name] NO.:  [id number]  MEDICAL RECORD NO.:  [id number]  LOCATION:  2913                         FACILITY:  [facility]  PHYSICIAN:  [name], MDDATE OF BIRTH:  12/03/1950  DATE OF ADMISSION:  05/27/2011 DATE OF DISCHARGE:                             HISTORY & PHYSICAL   CHIEF COMPLAINT:  Three weeks of progressive general malaise, shortness of breath, and general swelling of legs and scrotum.  HISTORY OF PRESENT ILLNESS:  [name] is a 60 year old gentleman, who according to him and his wife has been having progressive generalized malaise and shortness of breath over the last 3 weeks.  The patient also was noted to have swelling in his legs, which is now progressed up to his scrotum.  The patient went to see his primary care doctor today, Dr. Bufford [name], who did laboratory studies and found him to be in acute renal failure.  The patient was asked to come to the emergency room for further evaluation and management.  The patient denies any fevers or chills.  He denies any nausea, vomiting, or diarrhea.  The patient states that the only problem that he has been having is that he has been feeling generally weak and increased short of breath.  The only other confounding factors that the patient was started on indomethacin 4 months ago and stated that he noticed that he was having a little bit of usual feeling since 4 months ago.  The patient states that his appetite has decreased and he thought he was losing weight.  However, his wife reports that she felt that he was gaining weight, particularly around his mid section.  PAST MEDICAL HISTORY:  Significant for obstructive sleep apnea on CPAP 10 years, diabetes type 2, hypothyroidism, morbid obesity with a BMI greater than 80, hypertension, diabetes type 2, chronic back pain.  FAMILY HISTORY:  Significant for diabetes in parents.  SOCIAL HISTORY:  The patient lives with his wife, Ms [name], can be reached at [phone].  He denies any tobacco, alcohol, or drug use.  CURRENT MEDICATIONS:  Include the following, 1. Synthroid 50 mcg p.o. daily. 2. Glipizide XL 10 mg p.o. daily. 3. Potassium chloride 20 mEq p.o. daily. 4. Indomethacin 50 mg p.o. b.i.d. 5. Hydrocodone/APAP 7.5/325 mg p.o. q.6 h p.r.n. 6. Multivitamin 1 tablet p.o. daily. 7. Lasix 80 mg p.o. daily, which was recently decreased from 120 mg     p.o. daily. 8. NovoLog 12 units subcu t.i.d. with meals. 9. Levemir 34 units subcu at bedtime. 10.Flexeril 10 mg p.o. daily p.r.n. 11.Ambien 10 mg p.o. p.r.n. 12.Lisinopril 10 mg p.o. daily.  ALLERGIES:  No known drug allergies.  PRIMARY CARE PHYSICIAN:  Dr. Bufford [name].  LABORATORY DATA:  Studies in the emergency room showed the following. Chest x-ray shows vascular congestion without overt pulmonary edema and also shows cardiac enlargement.  There is a right-sided pleural effusion and bibasilar atelectasis.  Laboratory studies show the following. White blood cell count of 3.2, hemoglobin of 9.7, hematocrit of 28.5, platelet count of 67,000.  Sodium 136, potassium 5.9, chloride 98, bicarb 29, BUN 104, creatinine 3.42.  Please note, the patient's  baseline creatinine is 0.7 in December 2010.  Cardiac enzymes are negative for the first set.  REVIEW OF SYSTEMS:  All other systems negative except as noted in the HPI.  PHYSICAL EXAMINATION:  GENERAL:  The patient is sitting up in the bed eating dinner and applesauce.  He is verbose; however, he does appear to have increased effort for moving as he was in the bed. VITAL SIGNS:  His temperature is 98.2, heart rate 106, blood pressure 119/76, respiratory rate 20, O2 sats are 100% on 2 L. HEENT:  He is normocephalic, atraumatic.  Pupils are equally round and reactive to light.  Fundi benign.  Extraocular movements are intact. There is no icterus.  There is mild conjunctival pallor.  Oropharynx is moist.  No  exudate, erythema, or lesions noted. NECK:  The patient has a very thick neck, it is supple.  I cannot appreciate any lymphadenopathy.  I cannot appreciate any bruit.  There appears to be a murmur transmitted. LUNGS:  The patient has diminished breath sounds with air entry throughout.  There are no crackles.  No rales.  No wheezing noticed. There is no dullness to percussion and there is no wheeze, vocal fremitus. CARDIOVASCULAR:  He has got a normal S1 and S2.  There is a 2-3/6 systolic ejection murmur best heard at the base and radiating to the axilla as well as the carotids bilaterally. ABDOMEN:  The patient's abdomen is markedly obese.  He has normoactive bowel sounds.  It is soft, nontender.  No masses.  Cannot appreciate any hepatosplenomegaly.  The patient has no guarding or rebound. EXTREMITIES:  The patient has anasarca up to the umbilicus with markedly enlarged scrotum. LYMPHADENOPATHY:  I cannot appreciate any cervical, axillary, inguinal lymphadenopathy, although limited in the examination due to the patient's obesity. NEUROLOGICAL:  The patient appears to have no focal neurological deficits, but appears to have generalized weakness.  DTRs are 2+ in the bilateral upper and lower extremities. PSYCHIATRIC:  He is alert and oriented x3.  Good insight and cognition, good recent and remote recall.  ASSESSMENT/PLAN:  This is a gentleman who presents with, 1. Acute renal failure.  It is unclear as to the cause of this.  The     patient does have multiple reasons, which could be occurring     including NSAID use, possibly congestive heart failure, may have an     acute tubular necrosis, or there may be some underlying process     like multiple myeloma occurring.  I will go ahead and ask     Nephrology to see the patient.  The patient just have mildly     elevated potassium at about 5.9.  The patient has been given     Kayexalate in the emergency room.  We will repeat the  potassium     after he has had a bowel movement.  The EKG shows no changes,     associated with an elevated potassium, but does show some mild     lateral leads, ST depression. 2. Anasarca.  The patient has significant anasarca.  I suspect this is     multifactorial.  I am unsure as to cause of this.  The patient does     not have any evidence of liver dysfunction based on his LFTs to     suggest that he may be having problems with generating protein.  I     will get an UA on the patient to see if there  is any spillage of     protein to see the patient has a nephrotic syndrome.  We will go     ahead and start the patient on aggressive diuresis with Lasix 80 mg     IV b.i.d. and defer to the nephrologist on the need for any further     escalation of his Lasix. 3. Systolic ejection murmur.  This is somewhat concerning for the     patient.  I do not know whether or not this is a new murmur or old     murmur.  I will try to clarify that with his primary care     physician.  However, we are going to go ahead and get a 2-D     echocardiogram on the patient.  The patient shows no signs of     infection that was just endocarditis, however, if the patient     starts having any signs of fever or sepsis, we will go ahead and     get blood cultures and cover the patient empirically with this     thought in mind. 4. Diabetes type 2.  The patient will be taken off any long-acting     insulin and covered with sliding scale. 5. Hypertension.  We will go ahead and watch the patient's blood     pressure.  I will hold the ACE inhibitor at this time and treat the     patient with beta-blocker if necessary.  Currently, the patient is     being admitted to a telemetry bed.  We will go ahead and cycle his     enzymes to make sure he did not have a cardiac event leading to     this condition.  However, his chest x-ray does not presents     findings consistent with this. 6. In terms of hypothyroidism, I will  continue Synthroid replacement.     I will use Lovenox for deep venous thrombosis prophylaxis on this     patient and at this point yet no reason for GI prophylaxis.     Collin Harm, MD     MAM/MEDQ  D:  05/28/2011  T:  05/28/2011  Job:  [id number]  cc:   Collin Spikes, DO  Electronically Signed by Marthann Schiller MD on 06/23/2011 10:34:12 AM

## 2011-07-12 ENCOUNTER — Encounter: Payer: Self-pay | Admitting: Gastroenterology

## 2011-07-12 ENCOUNTER — Ambulatory Visit (INDEPENDENT_AMBULATORY_CARE_PROVIDER_SITE_OTHER): Payer: 59 | Admitting: Gastroenterology

## 2011-07-12 ENCOUNTER — Other Ambulatory Visit (INDEPENDENT_AMBULATORY_CARE_PROVIDER_SITE_OTHER): Payer: 59

## 2011-07-12 VITALS — BP 132/60 | HR 74 | Ht 69.0 in | Wt 300.0 lb

## 2011-07-12 DIAGNOSIS — K7460 Unspecified cirrhosis of liver: Secondary | ICD-10-CM

## 2011-07-12 DIAGNOSIS — Z1211 Encounter for screening for malignant neoplasm of colon: Secondary | ICD-10-CM

## 2011-07-12 LAB — COMPREHENSIVE METABOLIC PANEL
AST: 47 U/L — ABNORMAL HIGH (ref 0–37)
Albumin: 3.6 g/dL (ref 3.5–5.2)
BUN: 61 mg/dL — ABNORMAL HIGH (ref 6–23)
CO2: 30 mEq/L (ref 19–32)
Calcium: 9.7 mg/dL (ref 8.4–10.5)
Chloride: 94 mEq/L — ABNORMAL LOW (ref 96–112)
GFR: 48.34 mL/min — ABNORMAL LOW (ref >60.00)
Glucose, Bld: 237 mg/dL — ABNORMAL HIGH (ref 70–99)
Potassium: 4.1 mEq/L (ref 3.5–5.1)

## 2011-07-12 LAB — CBC WITH DIFFERENTIAL/PLATELET
Basophils Absolute: 0 K/uL (ref 0.0–0.1)
Basophils Relative: 1.4 % (ref 0.0–3.0)
Eosinophils Absolute: 0.1 K/uL (ref 0.0–0.7)
Eosinophils Relative: 2.1 % (ref 0.0–5.0)
HCT: 32.8 % — ABNORMAL LOW (ref 39.0–52.0)
Hemoglobin: 11.7 g/dL — ABNORMAL LOW (ref 13.0–17.0)
Lymphocytes Relative: 27.1 % (ref 12.0–46.0)
Lymphs Abs: 0.8 K/uL (ref 0.7–4.0)
MCHC: 35.6 g/dL (ref 30.0–36.0)
MCV: 91.8 fl (ref 78.0–100.0)
Monocytes Absolute: 0.3 K/uL (ref 0.1–1.0)
Monocytes Relative: 8.9 % (ref 3.0–12.0)
Neutro Abs: 1.9 K/uL (ref 1.4–7.7)
Neutrophils Relative %: 60.5 % (ref 43.0–77.0)
Platelets: 74 K/uL — ABNORMAL LOW (ref 150.0–400.0)
RBC: 3.58 Mil/uL — ABNORMAL LOW (ref 4.22–5.81)
RDW: 14.3 % (ref 11.5–14.6)
WBC: 3.1 K/uL — ABNORMAL LOW (ref 4.5–10.5)

## 2011-07-12 MED ORDER — PEG-KCL-NACL-NASULF-NA ASC-C 100 G PO SOLR: 1.0000 | ORAL | Status: DC

## 2011-07-12 NOTE — Patient Instructions (Signed)
You will have labs checked today in the basement lab.  Please head down after you check out with the front desk  (cbc, inr, cmet, ceruloplasm, AMA, Hepatitis A total Ig, Hepatitis B Surface Antibody, AFT). I may change your diuretic doses depending on lab results. You will be set up for an upper endoscopy for screening varices You will be set up for a colonoscopy for routine screening. At [facility] with propofol. It is important that you have a relatively low salt diet.  High salt diet can cause fluid to accumulate in your legs, abdomen and even around your lungs. You should try to avoid NSAID type over the counter pain medicines as best as possible. Tylenol is safe to take for 'routine' aches and pains, but never take more than 1/2 the dose suggested on the package instructions (never more than 2 grams per day). Avoid alcohol. ROV with Dr. Christella Hartigan in 5-6 weeks. A copy of this information will be made available to Dr. Bufford Spikes.

## 2011-07-12 NOTE — Progress Notes (Signed)
  Review of pertinent gastrointestinal problems: 1. Cirrhosis:  Likely from fatty liver, perhaps cardiac related Labs July 2012: ANA negative, HIV negative, iron studies normal, hepatitis B. surface antigen negative, hepatitis B core antibody negative, hepatitis C antibody negative, hepatitis A IgM negative Most recent imaging: CT scan June 2012 showed pleural effusion, ascites, cirrhosis and splenomegaly (non-IV contrast due to renal insufficiency)  HPI: Pleasant 60 yo man who is here with his wife this AM.  I last saw him at time of admission about 1 month ago.   Feels great except for right hip pains.  This is a chronic problem for him.  He asked about pain meds for it.  Was never jaundiced, never had hepatitis, never a big etoh drinker.   No liver problems in his family.  Has been on lasix for a long time, dose doubled 3-4 months ago.  He was started on spironalactone 6 weeks ago.    Has not followed up with renal MDs.    He had echo while in hosp, I cannot find that report.  He has lost 85 pounds in 7-8 months.  Most is water weight, some due to diet control.     Review of systems: Pertinent positive and negative review of systems were noted in the above HPI section.  All other review of systems was otherwise negative.   Past Medical History  Diagnosis Date  . Hypertension   . Obstructive sleep apnea   . Type II or unspecified type diabetes mellitus without mention of complication, not stated as uncontrolled   . Thyroid disease   . Cirrhosis, non-alcoholic   . Morbid obesity   . Pancytopenia   . Splenomegaly     Past Surgical History  Procedure Date  . Appendectomy      reports that he has never smoked. He has never used smokeless tobacco. He reports that he does not drink alcohol or use illicit drugs.  family history is not on file.    Current Medications, Allergies were all reviewed with the patient via [facility] electronic medical record  system.    Physical Exam: BP 132/60  Pulse 74  Ht 5\' 9"  (1.753 m)  Wt 300 lb (136.079 kg)  BMI 44.30 kg/m2 Constitutional: Morbidly obese, walks with a cane Psychiatric: alert and oriented x3 Eyes: extraocular movements intact Mouth: oral pharynx moist, no lesions Neck: supple no lymphadenopathy Cardiovascular: heart regular rate and rhythm Lungs: clear to auscultation bilaterally Abdomen: soft, nontender, nondistended, no obvious ascites, no peritoneal signs, normal bowel sounds Extremities: 1+ lower extremity edema bilaterally Skin: no lesions on visible extremities    Assessment and plan: 60 y.o. male with recently diagnosed cirrhosis  I suspect his cirrhosis is from underlying fatty liver disease however I'll send further blood tests to rule out other causes. He needs colonoscopy for routine screening, upper endoscopy to screen for varices. I may adjust his diuretics based on his lab tests since it looks like he still has some edema in his legs. Overall he has made some real lifestyle changes to help him lose weight and I commended him on that. He'll return to see me in 5-6 weeks, sooner if needed. We will have to immunize him for hepatitis A and B. in the near future as well.

## 2011-07-13 LAB — CERULOPLASMIN: Ceruloplasmin: 28 mg/dL (ref 20–60)

## 2011-07-14 LAB — MITOCHONDRIAL ANTIBODIES: Mitochondrial M2 Ab, IgG: 0.35 (ref <0.91)

## 2011-07-18 ENCOUNTER — Other Ambulatory Visit: Payer: Self-pay

## 2011-07-18 ENCOUNTER — Ambulatory Visit: Payer: 59 | Admitting: Pulmonary Disease

## 2011-07-18 DIAGNOSIS — K7460 Unspecified cirrhosis of liver: Secondary | ICD-10-CM

## 2011-07-21 ENCOUNTER — Telehealth: Payer: Self-pay | Admitting: Gastroenterology

## 2011-07-21 NOTE — Telephone Encounter (Signed)
Pt is aware that the labs need to be drawn by our lab,  He agreed and will be here later today

## 2011-07-25 ENCOUNTER — Other Ambulatory Visit (INDEPENDENT_AMBULATORY_CARE_PROVIDER_SITE_OTHER): Payer: 59

## 2011-07-25 DIAGNOSIS — K7460 Unspecified cirrhosis of liver: Secondary | ICD-10-CM

## 2011-07-25 LAB — BASIC METABOLIC PANEL WITH GFR
BUN: 49 mg/dL — ABNORMAL HIGH (ref 6–23)
CO2: 32 meq/L (ref 19–32)
Calcium: 9.5 mg/dL (ref 8.4–10.5)
Chloride: 95 meq/L — ABNORMAL LOW (ref 96–112)
Creatinine, Ser: 1.4 mg/dL (ref 0.4–1.5)
GFR: 54.76 mL/min — ABNORMAL LOW (ref >60.00)
Glucose, Bld: 156 mg/dL — ABNORMAL HIGH (ref 70–99)
Potassium: 5 meq/L (ref 3.5–5.1)
Sodium: 134 meq/L — ABNORMAL LOW (ref 135–145)

## 2011-08-08 ENCOUNTER — Other Ambulatory Visit (HOSPITAL_COMMUNITY): Payer: 59

## 2011-08-10 ENCOUNTER — Telehealth: Payer: Self-pay | Admitting: Gastroenterology

## 2011-08-10 NOTE — Telephone Encounter (Signed)
Pt was confused about the diet for the day of the procedure. He was advised to be NPO after midnight except the prep solution.  Pt agreed and thanked me for my help

## 2011-08-11 ENCOUNTER — Ambulatory Visit (HOSPITAL_COMMUNITY)
Admission: RE | Admit: 2011-08-11 | Discharge: 2011-08-11 | Disposition: A | Discharge status: Home / Self Care | Payer: 59 | Source: Ambulatory Visit | Attending: Gastroenterology | Admitting: Gastroenterology

## 2011-08-11 ENCOUNTER — Encounter: Payer: 59 | Admitting: Gastroenterology

## 2011-08-11 DIAGNOSIS — K2970 Gastritis, unspecified, without bleeding: Secondary | ICD-10-CM | POA: Insufficient documentation

## 2011-08-11 DIAGNOSIS — K7460 Unspecified cirrhosis of liver: Secondary | ICD-10-CM | POA: Insufficient documentation

## 2011-08-11 DIAGNOSIS — Z1211 Encounter for screening for malignant neoplasm of colon: Secondary | ICD-10-CM

## 2011-08-11 DIAGNOSIS — K2990 Gastroduodenitis, unspecified, without bleeding: Secondary | ICD-10-CM

## 2011-08-11 LAB — GLUCOSE, CAPILLARY: Glucose-Capillary: 156 mg/dL — ABNORMAL HIGH (ref 70–99)

## 2011-08-22 ENCOUNTER — Encounter: Payer: Self-pay | Admitting: Pulmonary Disease

## 2011-08-22 ENCOUNTER — Other Ambulatory Visit (HOSPITAL_COMMUNITY): Payer: Self-pay | Admitting: Internal Medicine

## 2011-08-22 ENCOUNTER — Encounter: Payer: Self-pay | Admitting: Gastroenterology

## 2011-08-22 ENCOUNTER — Ambulatory Visit (INDEPENDENT_AMBULATORY_CARE_PROVIDER_SITE_OTHER): Payer: 59 | Admitting: Gastroenterology

## 2011-08-22 VITALS — BP 118/78 | HR 90 | Ht 69.0 in | Wt 302.0 lb

## 2011-08-22 DIAGNOSIS — Z01810 Encounter for preprocedural cardiovascular examination: Secondary | ICD-10-CM

## 2011-08-22 DIAGNOSIS — K7460 Unspecified cirrhosis of liver: Secondary | ICD-10-CM | POA: Insufficient documentation

## 2011-08-22 DIAGNOSIS — Z23 Encounter for immunization: Secondary | ICD-10-CM

## 2011-08-22 MED ORDER — SPIRONOLACTONE 50 MG PO TABS: 50.0000 mg | ORAL_TABLET | Freq: Two times a day (BID) | ORAL | Status: DC

## 2011-08-22 NOTE — Progress Notes (Signed)
Review of pertinent gastrointestinal problems:  1. Cirrhosis: Likely from fatty liver, perhaps cardiac related  Labs July 2012: ANA negative, HIV negative, iron studies normal, hepatitis B. surface antigen negative, hepatitis B core antibody negative, hepatitis C antibody negative, hepatitis A IgM negative. Anti-mitochondrial antibody negative, hepatitis B surface antibody negative, hepatitis a total antibody negative, ceruloplasmin normal.  Most recent imaging: CT scan June 2012 showed pleural effusion, ascites, cirrhosis and splenomegaly (non-IV contrast due to renal insufficiency). .  Most recent EGD: September 2012 found mild nonspecific gastritis but no varices  Most recet AFP aug 2012 normal  2. Routine risk for colon cancer, colonoscopy September 2012 was normal. Next colonoscopy at 10 year interval      HPI: This is a   very pleasant 59 year old man whom I last saw at the time upper and lower endoscopy last week. See those results summarized above.  Planning on having a stress test in near future as workup for a hip replacement surgery with Dr. Magnus Ivan.  He is having increasing difficulty with edema in his legs. He is on Lasix 80 twice daily but relatively low dose Aldactone 25 mg twice daily.    Past Medical History:   Hypertension                                                 Obstructive sleep apnea                                      Type II or unspecified type diabetes mellitus *              Thyroid disease                                              Cirrhosis, non-alcoholic                                     Morbid obesity                                               Pancytopenia                                                 Splenomegaly                                                 Fatty liver                                                 Past Surgical  History:   APPENDECTOMY                                                 reports that he has never  smoked. He has never used smokeless tobacco. He reports that he does not drink alcohol or use illicit drugs.  family history includes Diabetes in his father and mother.  There is no history of Colon cancer.    Current medicines and allergies were reviewed in [facility]    Physical Exam: Ht 5\' 9"  (1.753 m) Constitutional: Morbidly obese Psychiatric: alert and oriented x3 Abdomen: soft, nontender, nondistended, no obvious ascites, no peritoneal signs, normal bowel sounds 1-2+ pitting edema in ankles    Assessment and plan: 60 y.o. male with cirrhosis  He is having increasing difficulty with edema and we will increase his Aldactone to 50 mg twice daily. He has borderline renal function to begin with and we will have to monitor this closely. He is trying to have a hip replacement surgery for severe hip pain. From a liver perspective he is also borderline acceptable for a surgery such as this, his platelets are low, his coags are a bit off, he does have edema as well. I did explain to him that his liver may decompensate perioperatively in which case we would be able to get involved and help out. He will return to see me in at least 2 months and sooner if needed, in the meantime we'll try to titrate his diuretics appropriately.

## 2011-08-22 NOTE — Patient Instructions (Addendum)
It is important that you have a relatively low salt diet.  High salt diet can cause fluid to accumulate in your legs, abdomen and even around your lungs. You should try to avoid NSAID type over the counter pain medicines as best as possible. Tylenol is safe to take for 'routine' aches and pains, but never take more than 1/2 the dose suggested on the package instructions (never more than 2 grams per day). Avoid alcohol. Hepatitis A and B immunization series to start today. Return to see Dr. Christella Hartigan in 2 months (cbc, cmet, inr the day prior)  Lbs on 10/24/11  Appointment after that. A copy of this information will be made available to Dr. Bufford Spikes. Will increase the dose of your aldactone to 50 (take one pill twice daily). BMET in 7-10 days.  Result needs to be sent to Dr. Christella Hartigan.

## 2011-08-23 ENCOUNTER — Ambulatory Visit (INDEPENDENT_AMBULATORY_CARE_PROVIDER_SITE_OTHER): Payer: 59 | Admitting: Pulmonary Disease

## 2011-08-23 ENCOUNTER — Encounter: Payer: Self-pay | Admitting: Pulmonary Disease

## 2011-08-23 VITALS — BP 132/72 | HR 100 | Temp 98.4°F | Ht 69.0 in | Wt 305.8 lb

## 2011-08-23 DIAGNOSIS — G4733 Obstructive sleep apnea (adult) (pediatric): Secondary | ICD-10-CM

## 2011-08-23 NOTE — Assessment & Plan Note (Signed)
The pt has been doing well with cpap, but is way overdue for a new machine and mask.  He has also had an episode of CHF, and continues to struggle with edema.  Would like to get him a new machine, and take the opportunity to re-optimize his pressure.  Will also recheck his ONO on optimal cpap, to make sure his current level of supplemental oxygen is adequate.  I have encouraged him to work aggressively on weight loss.

## 2011-08-23 NOTE — Progress Notes (Signed)
  Subjective:    Patient ID: [name], male    DOB: [birth year], 60 y.o.   MRN: [id number]  HPI The patient comes in today for followup of his severe obstructive sleep apnea.  He has not been seen since 2009, and his way over due for a new CPAP machine and mask.  He is been wearing CPAP compliantly, but feels the device is not working properly at this time.  He has recently had admission to the hospital for a bout of congestive heart failure, and continues to struggle with lower extremity edema.  He does not feel that he is sleeping as well as in the past, and certainly does not feel as rested.   Review of Systems  Constitutional: Negative for fever and unexpected weight change.  HENT: Negative for ear pain, nosebleeds, congestion, sore throat, rhinorrhea, sneezing, trouble swallowing, dental problem, postnasal drip and sinus pressure.   Eyes: Negative for redness and itching.  Respiratory: Negative for cough, chest tightness, shortness of breath and wheezing.   Cardiovascular: Negative for palpitations and leg swelling.  Gastrointestinal: Negative for nausea and vomiting.  Genitourinary: Negative for dysuria.  Musculoskeletal: Positive for joint swelling.  Skin: Negative for rash.  Neurological: Negative for headaches.  Hematological: Does not bruise/bleed easily.  Psychiatric/Behavioral: Negative for dysphoric mood. The patient is not nervous/anxious.        Objective:   Physical Exam Morbidly obese male in nad No skin breakdown or pressure necrosis from cpap mask.  No purulence or discharge noted. LE with 2+ edema, no cyanosis Alert, does not appear sleepy, moves all 4        Assessment & Plan:

## 2011-08-23 NOTE — Patient Instructions (Signed)
Will get you a new machine, and also re-optimize your pressure.  Once this is done, will check oxygen level overnight to make sure is adequate. Work on weight loss. followup with me in one year, but will call you with results of above.

## 2011-08-24 ENCOUNTER — Ambulatory Visit (HOSPITAL_BASED_OUTPATIENT_CLINIC_OR_DEPARTMENT_OTHER): Payer: 59 | Admitting: Radiology

## 2011-08-24 VITALS — Ht 69.0 in | Wt 300.0 lb

## 2011-08-24 DIAGNOSIS — Z01810 Encounter for preprocedural cardiovascular examination: Secondary | ICD-10-CM

## 2011-08-24 DIAGNOSIS — E109 Type 1 diabetes mellitus without complications: Secondary | ICD-10-CM

## 2011-08-24 DIAGNOSIS — R9431 Abnormal electrocardiogram [ECG] [EKG]: Secondary | ICD-10-CM

## 2011-08-24 MED ORDER — TECHNETIUM TC 99M TETROFOSMIN IV KIT
33.0000 | PACK | Freq: Once | INTRAVENOUS | Status: AC | PRN
  Administered 2011-08-24: 33 via INTRAVENOUS

## 2011-08-24 MED ORDER — REGADENOSON 0.4 MG/5ML IV SOLN
0.4000 mg | Freq: Once | INTRAVENOUS | Status: AC
  Administered 2011-08-24: 0.4 mg via INTRAVENOUS

## 2011-08-24 NOTE — Progress Notes (Signed)
[facility] SITE 3 NUCLEAR MED [address] [place] [address] [phone]  Cardiology Nuclear Med Study  [name] is a 60 y.o. male [id number] 08-15-1951   Nuclear Med Background Indication for Stress Test:  Evaluation for Ischemia and Surgical Clearance Pending (R) THR by Dr. Allie Bossier History: 05/28/11 Echo:EF=55-60%, Mild AS, MR and TR, Trivial PE Cardiac Risk Factors: Hypertension, IDDM Type 1, Lipids and Obesity  Symptoms:  Fatigue   Nuclear Pre-Procedure Caffeine/Decaff Intake:  None NPO After: 7:00am   Lungs:  Clear.  O2 SAT 98% on RA IV 0.9% NS with Angio Cath:  22g  IV Site: R Antecubital  IV Started by:  Bonnita Levan, RN  Chest Size (in):  52 Cup Size: n/a  Height: 5\' 9"  (1.753 m)  Weight:  300 lb (136.079 kg)  BMI:  Body mass index is 44.30 kg/(m^2). Tech Comments:  No medications taken today.  BS @ 530 AM-152    Nuclear Med Study 1 or 2 day study: 2 day  Stress Test Type:  Lexiscan  Reading MD: Cassell Clement, MD  Order Authorizing Provider:  Bufford Spikes, DO  Resting Radionuclide: Technetium 69m Tetrofosmin  Resting Radionuclide Dose: 33.0 mCi   Stress Radionuclide:  Technetium 18m Tetrofosmin  Stress Radionuclide Dose: 33.0 mCi           Stress Protocol Rest HR: 108 Stress HR: 114  Rest BP: 99/67 Stress BP: 106/57  Exercise Time (min): n/a METS: n/a   Predicted Max HR: 159 bpm % Max HR: 71.7 bpm Rate Pressure Product: 21308   Dose of Adenosine (mg):  n/a Dose of Lexiscan: 0.4 mg  Dose of Atropine (mg): n/a Dose of Dobutamine: n/a mcg/kg/min (at max HR)  Stress Test Technologist: Smiley Houseman, CMA-N  Nuclear Technologist:  Domenic Polite, CNMT     Rest Procedure:  Myocardial perfusion imaging was performed at rest 45 minutes following the intravenous administration of Technetium 74m Tetrofosmin.  Rest ECG: Nonspecific ST-T wave changes.  Stress Procedure:  The patient received IV Lexiscan 0.4 mg over 15-seconds.   Technetium 64m Tetrofosmin injected at 30-seconds.  There were no significant changes with Lexiscan, rare PVC's.  Quantitative spect images were obtained after a 45 minute delay.  Stress ECG: No significant change from baseline ECG  QPS Raw Data Images:  Normal; no motion artifact; normal heart/lung ratio. Stress Images:  There is decreased uptake in the apex and in the basilar inferior wall Rest Images:  Improved uptake in apex and  Normal uptake in inferior wall Subtraction (SDS):  These findings are consistent with possible ischemia. Transient Ischemic Dilatation (Normal <1.22):  1.22 Lung/Heart Ratio (Normal <0.45):  0.43  Quantitative Gated Spect Images QGS EDV:  188 ml QGS ESV:  119 ml QGS cine images:  Depressed LV systolic function with apical hypokinesis. QGS EF: 37%  Impression Exercise Capacity:  Lexiscan with no exercise. BP Response:  Normal blood pressure response. Clinical Symptoms:  No chest pain. ECG Impression:  No significant ST segment change suggestive of ischemia. Comparison with Prior Nuclear Study: No previous nuclear study performed  Overall Impression:  Abnormal stress nuclear study. Left ventricular function is depressed with apical wall motion abnormality.  Study is suggestive of multivessel disease with reversible ischemia in apex and in mid- and basilar inferior wall.    Cassell Clement

## 2011-08-25 ENCOUNTER — Ambulatory Visit (HOSPITAL_COMMUNITY): Payer: 59 | Attending: Internal Medicine | Admitting: Radiology

## 2011-08-25 DIAGNOSIS — R0989 Other specified symptoms and signs involving the circulatory and respiratory systems: Secondary | ICD-10-CM

## 2011-08-25 MED ORDER — TECHNETIUM TC 99M TETROFOSMIN IV KIT
33.0000 | PACK | Freq: Once | INTRAVENOUS | Status: AC | PRN
  Administered 2011-08-25: 33 via INTRAVENOUS

## 2011-08-26 ENCOUNTER — Telehealth (HOSPITAL_COMMUNITY): Payer: Self-pay | Admitting: Radiology

## 2011-08-28 ENCOUNTER — Emergency Department (HOSPITAL_COMMUNITY): Payer: 59

## 2011-08-28 ENCOUNTER — Inpatient Hospital Stay (HOSPITAL_COMMUNITY)
Admission: EM | Admit: 2011-08-28 | Discharge: 2011-08-31 | DRG: 947 | Disposition: A | Discharge status: Home / Self Care | Payer: 59 | Attending: Cardiovascular Disease | Admitting: Cardiovascular Disease

## 2011-08-28 DIAGNOSIS — I359 Nonrheumatic aortic valve disorder, unspecified: Secondary | ICD-10-CM | POA: Diagnosis present

## 2011-08-28 DIAGNOSIS — Z6841 Body Mass Index (BMI) 40.0 and over, adult: Secondary | ICD-10-CM

## 2011-08-28 DIAGNOSIS — E1129 Type 2 diabetes mellitus with other diabetic kidney complication: Secondary | ICD-10-CM | POA: Diagnosis present

## 2011-08-28 DIAGNOSIS — I214 Non-ST elevation (NSTEMI) myocardial infarction: Secondary | ICD-10-CM | POA: Diagnosis present

## 2011-08-28 DIAGNOSIS — I509 Heart failure, unspecified: Secondary | ICD-10-CM | POA: Diagnosis present

## 2011-08-28 DIAGNOSIS — M169 Osteoarthritis of hip, unspecified: Secondary | ICD-10-CM | POA: Diagnosis present

## 2011-08-28 DIAGNOSIS — Z794 Long term (current) use of insulin: Secondary | ICD-10-CM

## 2011-08-28 DIAGNOSIS — I5032 Chronic diastolic (congestive) heart failure: Secondary | ICD-10-CM | POA: Diagnosis present

## 2011-08-28 DIAGNOSIS — D61818 Other pancytopenia: Secondary | ICD-10-CM | POA: Diagnosis present

## 2011-08-28 DIAGNOSIS — T424X5A Adverse effect of benzodiazepines, initial encounter: Secondary | ICD-10-CM | POA: Diagnosis present

## 2011-08-28 DIAGNOSIS — I129 Hypertensive chronic kidney disease with stage 1 through stage 4 chronic kidney disease, or unspecified chronic kidney disease: Secondary | ICD-10-CM | POA: Diagnosis present

## 2011-08-28 DIAGNOSIS — E039 Hypothyroidism, unspecified: Secondary | ICD-10-CM | POA: Diagnosis present

## 2011-08-28 DIAGNOSIS — K7460 Unspecified cirrhosis of liver: Secondary | ICD-10-CM | POA: Diagnosis present

## 2011-08-28 DIAGNOSIS — N182 Chronic kidney disease, stage 2 (mild): Secondary | ICD-10-CM | POA: Diagnosis present

## 2011-08-28 DIAGNOSIS — T40605A Adverse effect of unspecified narcotics, initial encounter: Secondary | ICD-10-CM | POA: Diagnosis present

## 2011-08-28 DIAGNOSIS — M1610 Unilateral primary osteoarthritis, unspecified hip: Secondary | ICD-10-CM | POA: Diagnosis present

## 2011-08-28 DIAGNOSIS — G4733 Obstructive sleep apnea (adult) (pediatric): Secondary | ICD-10-CM | POA: Diagnosis present

## 2011-08-28 DIAGNOSIS — R4182 Altered mental status, unspecified: Principal | ICD-10-CM | POA: Diagnosis present

## 2011-08-28 DIAGNOSIS — E785 Hyperlipidemia, unspecified: Secondary | ICD-10-CM | POA: Diagnosis present

## 2011-08-28 DIAGNOSIS — I2510 Atherosclerotic heart disease of native coronary artery without angina pectoris: Secondary | ICD-10-CM | POA: Diagnosis present

## 2011-08-28 DIAGNOSIS — Z79899 Other long term (current) drug therapy: Secondary | ICD-10-CM

## 2011-08-28 LAB — RAPID URINE DRUG SCREEN, HOSP PERFORMED
Amphetamines: NOT DETECTED
Benzodiazepines: NOT DETECTED
Opiates: NOT DETECTED
Tetrahydrocannabinol: NOT DETECTED

## 2011-08-28 LAB — BLOOD GAS, ARTERIAL
Acid-Base Excess: 3.2 mmol/L — ABNORMAL HIGH (ref 0.0–2.0)
Drawn by: 22251
O2 Content: 2 L/min
O2 Saturation: 97.4 %
pCO2 arterial: 38.2 mmHg (ref 35.0–45.0)

## 2011-08-28 LAB — TROPONIN I: Troponin I: <0.3 ng/mL (ref <0.30)

## 2011-08-28 LAB — COMPREHENSIVE METABOLIC PANEL
ALT: 24 U/L (ref 0–53)
AST: 42 U/L — ABNORMAL HIGH (ref 0–37)
CO2: 27 mEq/L (ref 19–32)
Chloride: 97 mEq/L (ref 96–112)
GFR calc non Af Amer: 41 mL/min — ABNORMAL LOW (ref >60)
Sodium: 136 mEq/L (ref 135–145)
Total Bilirubin: 2.6 mg/dL — ABNORMAL HIGH (ref 0.3–1.2)

## 2011-08-28 LAB — LIPID PANEL
Cholesterol: 169 mg/dL (ref 0–200)
Total CHOL/HDL Ratio: 4.1 RATIO
Triglycerides: 103 mg/dL (ref <150)
VLDL: 21 mg/dL (ref 0–40)

## 2011-08-28 LAB — CARDIAC PANEL(CRET KIN+CKTOT+MB+TROPI)
CK, MB: 26.4 ng/mL (ref 0.3–4.0)
Relative Index: 3.1 — ABNORMAL HIGH (ref 0.0–2.5)
Total CK: 853 U/L — ABNORMAL HIGH (ref 7–232)
Troponin I: 5.48 ng/mL (ref <0.30)

## 2011-08-28 LAB — DIFFERENTIAL
Basophils Relative: 1 % (ref 0–1)
Eosinophils Absolute: 0 10*3/uL (ref 0.0–0.7)
Eosinophils Relative: 1 % (ref 0–5)
Lymphs Abs: 0.8 10*3/uL (ref 0.7–4.0)
Monocytes Absolute: 0.5 10*3/uL (ref 0.1–1.0)
Neutrophils Relative %: 62 % (ref 43–77)

## 2011-08-28 LAB — APTT: aPTT: 39 seconds — ABNORMAL HIGH (ref 24–37)

## 2011-08-28 LAB — CBC
HCT: 31.2 % — ABNORMAL LOW (ref 39.0–52.0)
MCHC: 37.2 g/dL — ABNORMAL HIGH (ref 30.0–36.0)
MCV: 86.9 fL (ref 78.0–100.0)
Platelets: 83 10*3/uL — ABNORMAL LOW (ref 150–400)
RDW: 14 % (ref 11.5–15.5)
WBC: 3.5 10*3/uL — ABNORMAL LOW (ref 4.0–10.5)

## 2011-08-28 LAB — HEPATIC FUNCTION PANEL
AST: 45 U/L — ABNORMAL HIGH (ref 0–37)
Albumin: 3.3 g/dL — ABNORMAL LOW (ref 3.5–5.2)
Alkaline Phosphatase: 70 U/L (ref 39–117)
Total Protein: 7.2 g/dL (ref 6.0–8.3)

## 2011-08-28 LAB — URINALYSIS, ROUTINE W REFLEX MICROSCOPIC
Bilirubin Urine: NEGATIVE
Glucose, UA: NEGATIVE mg/dL
Ketones, ur: NEGATIVE mg/dL
Protein, ur: NEGATIVE mg/dL

## 2011-08-28 LAB — CK TOTAL AND CKMB (NOT AT ARMC)
CK, MB: 8.8 ng/mL (ref 0.3–4.0)
Total CK: 703 U/L — ABNORMAL HIGH (ref 7–232)

## 2011-08-28 LAB — AMMONIA: Ammonia: 36 umol/L (ref 11–60)

## 2011-08-28 LAB — URINE MICROSCOPIC-ADD ON

## 2011-08-29 ENCOUNTER — Telehealth: Payer: Self-pay | Admitting: Gastroenterology

## 2011-08-29 DIAGNOSIS — I214 Non-ST elevation (NSTEMI) myocardial infarction: Secondary | ICD-10-CM

## 2011-08-29 DIAGNOSIS — I059 Rheumatic mitral valve disease, unspecified: Secondary | ICD-10-CM

## 2011-08-29 LAB — COMPREHENSIVE METABOLIC PANEL
ALT: 23 U/L (ref 0–53)
Calcium: 8.9 mg/dL (ref 8.4–10.5)
GFR calc Af Amer: 84 mL/min — ABNORMAL LOW (ref >90)
Glucose, Bld: 152 mg/dL — ABNORMAL HIGH (ref 70–99)
Sodium: 136 mEq/L (ref 135–145)
Total Protein: 6.1 g/dL (ref 6.0–8.3)

## 2011-08-29 LAB — CARDIAC PANEL(CRET KIN+CKTOT+MB+TROPI): CK, MB: 24.1 ng/mL (ref 0.3–4.0)

## 2011-08-29 LAB — CBC
HCT: 29.3 % — ABNORMAL LOW (ref 39.0–52.0)
Hemoglobin: 10.7 g/dL — ABNORMAL LOW (ref 13.0–17.0)
MCH: 32 pg (ref 26.0–34.0)
MCHC: 36.5 g/dL — ABNORMAL HIGH (ref 30.0–36.0)
MCV: 87.7 fL (ref 78.0–100.0)

## 2011-08-29 LAB — GLUCOSE, CAPILLARY
Glucose-Capillary: 126 mg/dL — ABNORMAL HIGH (ref 70–99)
Glucose-Capillary: 127 mg/dL — ABNORMAL HIGH (ref 70–99)
Glucose-Capillary: 113 mg/dL — ABNORMAL HIGH (ref 70–99)
Glucose-Capillary: 187 mg/dL — ABNORMAL HIGH (ref 70–99)
Glucose-Capillary: 252 mg/dL — ABNORMAL HIGH (ref 70–99)

## 2011-08-29 LAB — URINE CULTURE: Culture: NO GROWTH

## 2011-08-29 LAB — VITAMIN D 25 HYDROXY (VIT D DEFICIENCY, FRACTURES): Vit D, 25-Hydroxy: 28 ng/mL — ABNORMAL LOW (ref 30–89)

## 2011-08-29 LAB — TSH: TSH: 1.956 u[IU]/mL (ref 0.350–4.500)

## 2011-08-29 NOTE — Telephone Encounter (Signed)
Pt was advised to discuss with the attending physician.

## 2011-08-30 ENCOUNTER — Encounter (HOSPITAL_COMMUNITY): Payer: 59

## 2011-08-30 DIAGNOSIS — I2510 Atherosclerotic heart disease of native coronary artery without angina pectoris: Secondary | ICD-10-CM

## 2011-08-30 LAB — CBC
MCH: 32.1 pg (ref 26.0–34.0)
MCHC: 36.2 g/dL — ABNORMAL HIGH (ref 30.0–36.0)
MCV: 88.8 fL (ref 78.0–100.0)
Platelets: 73 10*3/uL — ABNORMAL LOW (ref 150–400)
RBC: 3.3 MIL/uL — ABNORMAL LOW (ref 4.22–5.81)
RDW: 14 % (ref 11.5–15.5)

## 2011-08-30 LAB — BASIC METABOLIC PANEL
BUN: 22 mg/dL (ref 6–23)
CO2: 27 mEq/L (ref 19–32)
Chloride: 102 mEq/L (ref 96–112)
GFR calc non Af Amer: 87 mL/min — ABNORMAL LOW (ref >90)
Glucose, Bld: 124 mg/dL — ABNORMAL HIGH (ref 70–99)
Potassium: 3.5 mEq/L (ref 3.5–5.1)
Sodium: 137 mEq/L (ref 135–145)

## 2011-08-30 LAB — GLUCOSE, CAPILLARY
Glucose-Capillary: 128 mg/dL — ABNORMAL HIGH (ref 70–99)
Glucose-Capillary: 130 mg/dL — ABNORMAL HIGH (ref 70–99)
Glucose-Capillary: 210 mg/dL — ABNORMAL HIGH (ref 70–99)
Glucose-Capillary: 276 mg/dL — ABNORMAL HIGH (ref 70–99)

## 2011-08-31 LAB — CBC
Hemoglobin: 11 g/dL — ABNORMAL LOW (ref 13.0–17.0)
MCHC: 36.4 g/dL — ABNORMAL HIGH (ref 30.0–36.0)
RBC: 3.38 MIL/uL — ABNORMAL LOW (ref 4.22–5.81)
RDW: 14.4 % (ref 11.5–15.5)

## 2011-08-31 LAB — BASIC METABOLIC PANEL
BUN: 17 mg/dL (ref 6–23)
Chloride: 105 mEq/L (ref 96–112)
Creatinine, Ser: 0.97 mg/dL (ref 0.50–1.35)
GFR calc Af Amer: >90 mL/min (ref >90)

## 2011-08-31 NOTE — Cardiovascular Report (Signed)
NAMEMAMIE, DIIORIO NO.:  [id number]  MEDICAL RECORD NO.:  [id number]  LOCATION:  2505                         FACILITY:  [facility]  PHYSICIAN:  Veverly Fells. Excell Seltzer, MD  DATE OF BIRTH:  1951-05-22  DATE OF PROCEDURE:  08/30/2011 DATE OF DISCHARGE:                           CARDIAC CATHETERIZATION   PROCEDURES: 1. Left heart catheterization. 2. Selective coronary angiography. 3. Left ventricular angiography.  SURGEON:  Veverly Fells. Excell Seltzer, MD  Hilarie Fredrickson INDICATIONS:  Mr. Doggett is a 60 year old diabetic gentleman who presented with altered mental status.  He ruled in for a non-ST- elevation infarction with significant increase in his cardiac markers. He had just had a high-risk Myoview scan in our office the week before and was waiting for his palpation consultation.  We elected to proceed with cardiac cath in this patient in the setting of longstanding diabetes, a high-risk nuclear study, and non-ST-elevation infarction. Of note, the patient has had no chest pain.  PROCEDURE TECHNIQUE:  Risks and indications of procedure were reviewed with the patient and informed consent was obtained.  The right wrist was prepped, draped, and anesthetized with 1% lidocaine.  Using modified Seldinger technique, a 5-French sheath was placed in the right radial artery, 5000 units of fractionated heparin was administered intravenously, and 3 mg of verapamil was given through the radial sheath.  Standard Judkins catheters were used for coronary angiography. I had difficulty imaging the LAD.  The circumflex was selectively cannulated with standard catheters.  A multipurpose catheter was used. Ultimately, I had to use a EBU 3.5 cm guide catheter.  This demonstrated the LAD anatomy well.  The multipurpose catheter was used for left ventriculography.  A pullback across the aortic valve was done.  There were no immediate complications.  PROCEDURAL FINDINGS:  The aortic pressure is 89/64  with a mean of 76. Left ventricular pressure is 111/22.  There is a 22-mm peak-to-peak gradient across the aortic valve.  Left ventriculography shows normal LV systolic function.  The left ventricular ejection fraction is estimated at 55%.  Coronary angiography:  The left main is calcified.  The left main is very short and it appears widely patent.  LAD:  The LAD is calcified.  There is a proximal 80-90% stenosis leading into an aneurysmal segment involving the origin of the first diagonal branch.  The first diagonal is a moderate caliber vessel with no significant stenosis.  There is heavy calcification throughout the entire proximal LAD.  The mid and distal LAD have no high-grade stenoses visualized.  Left circumflex:  There is an intermediate branch that divides into twin vessels.  There is no significant stenosis of the intermediate seen. The left circumflex is also heavily calcified.  It has 50-70% mid vessel stenosis.  There is a small first OM and a large left posterolateral branch without significant stenosis.  Right coronary artery:  The right coronary artery origin has heavy calcification.  The vessel has a 90% proximal stenosis followed by an 80% mid stenosis just after the first RV marginal.  There is a large acute marginal present.  The vessel gives off a PDA branch that has no significant stenosis.  FINAL ASSESSMENT:  1. Severe stenoses of the proximal left anterior descending and     proximal and mid right coronary artery. 2. Moderate stenosis of the left circumflex. 3. Normal left ventricular systolic function. 4. Mild aortic stenosis.  RECOMMENDATIONS:  This is a complex situation in an obese gentleman with longstanding diabetes, hepatic cirrhosis secondary to nonalcoholic steatohepatitis, and thrombocytopenia with platelet count of 73,000.  He does have multivessel coronary disease with a recent nuclear study demonstrating multi-territory ischemia as well as  positive cardiac enzymes.  Recommend a Cardiac Surgery consult for consideration of coronary bypass surgery.  The patient is obviously not an ideal candidate for any revascularization procedures because of his thrombocytopenia and multiple comorbidities.  Further plan is pending cardiac surgery consultation.     Veverly Fells. Excell Seltzer, MD     MDC/MEDQ  D:  08/30/2011  T:  08/30/2011  Job:  [id number]  Electronically Signed by Tonny Bollman MD on 08/31/2011 10:35:20 PM

## 2011-08-31 NOTE — Discharge Summary (Addendum)
  [name], [name] NO.:  [id number]  MEDICAL RECORD NO.:  [id number]  LOCATION:  2505                         FACILITY:  [facility]  PHYSICIAN:  Veverly Fells. Excell Seltzer, MD  DATE OF BIRTH:  Apr 05, 1951  DATE OF ADMISSION:  08/28/2011 DATE OF DISCHARGE:  08/31/2011                              DISCHARGE SUMMARY   Please note after discussion the patient's discharge medications have changed.  See below. 1. Aspirin 81 mg daily. 2. Plavix 75 mg daily. 3. Lasix 80 mg daily. 4. Oxycodone 5 mg q.6 hours p.r.n. moderate to severe pain. 5. Cyclobenzaprine 10 mg daily as needed. 6. Levemir 25 units at bedtime with instructions to only take half     dose the night before his catheterization. 7. Levothyroxine 50 mcg daily. 8. Lidoderm 5 % patch, 1 patch transdermally every 12 hours. 9. Lactulose 30 mL by mouth t.i.d. p.r.n. 10.Multivitamin 1 tablet daily. 11.Nystatin topical 3 times daily. 12.Spironolactone 50 mg b.i.d. 13.Zolpidem 10 mg at bedtime p.r.n.  Initially the patient was going to be started on metoprolol but this will be held in light of continuing his other medications per Dr. Excell Seltzer.     Ronie Spies, P.A.C.   ______________________________ Veverly Fells. Excell Seltzer, MD    DD/MEDQ  D:  08/31/2011  T:  08/31/2011  Job:  [id number]  cc:   Veverly Fells. Excell Seltzer, MD  Electronically Signed by Tonny Bollman MD on 08/31/2011 10:35:39 PM Electronically Signed by Ronie Spies  on 09/06/2011 03:12:01 PM

## 2011-08-31 NOTE — Discharge Summary (Addendum)
NAMEFRAZIER, BALFOUR NO.:  [id number]  MEDICAL RECORD NO.:  [id number]  LOCATION:  2505                         FACILITY:  [facility]  PHYSICIAN:  Veverly Fells. Excell Seltzer, MD  DATE OF BIRTH:  03-28-1951  DATE OF ADMISSION:  08/28/2011 DATE OF DISCHARGE:  08/31/2011                              DISCHARGE SUMMARY   DISCHARGE DIAGNOSES: 1. Coronary artery disease with Non-ST-segment elevation myocardial     infarction this admission, multivessel coronary artery disease by     catheterization, August 30, 2011 with planned PCI next week to the     right coronary artery and left anterior descending with a very     complex left anterior descending lesion.     a.     Initiated on aspirin and Plavix. 2. 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 3. Sleep apnea, on CPAP. 4. Hypothyroidism. 5. Morbid obesity with BMI of 42. 6. Osteoarthritis of the right hip with severe hip pain. 7. Dyslipidemia. 8. Chronic renal insufficiency. 9. Hypertension. 10.Pancytopenia. 11.Type 2 diabetic mellitus.  HOSPITAL COURSE:  Mr. Steil is a 60 year old gentleman with history of type 2 diabetes, hypothyroidism, chronic advanced cirrhosis with history of encephalopathy, who presented to the ER with complaints of altered mental status and weakness.  He had general malaise.  Currently, he had a nuclear stress test on the office which was reportedly abnormal and he was set up to see [place] cardiologist in the office.  However, he presented to the ER with altered mental status.  In the meantime, which was felt likely combination of polypharmacy due to narcotics and benzodiazepines that he uses at home and that he also received to calm his agitation down.  CT of the head showed no acute intracranial abnormalities.  Cardiac enzymes were cycled, which demonstrated the patient ruled in for an NSTEMI and was seen by Cardiology.  At  that point in time, he was felt back to baseline.  2-D echocardiogram was obtained demonstrating EF of 50%-55% with no wall motion abnormalities. Dr. Excell Seltzer recommended cardiac catheterization in light of his abnormal nuclear study and NSTEMI.  This was done August 30, 2011, showing severe LAD and RCA stenosis with moderate left circumflex stenosis.  He also had mildly AS and normal LV function.  Dr. Excell Seltzer recommended CT Surgery consultation, who recommended that CABG be held off given his multiple comorbidities.  They recommended stented PCI.  Dr. Excell Seltzer has reviewed the films and plans to proceed with this.  He has complex situation given his multiple comorbidities.  He has not felt to be a CABG candidate and Dr. Excell Seltzer discussed this with him personally.  The patient felt to need hip replacement and this is probably likely is very poor.  Dr. Excell Seltzer like the patient to return next week for two-vessel PCI with an LAD lesion that is quite complex.  Dr. Excell Seltzer started Plavix here in the hospital with recommendations try to minimize to 30 days post PCI in the setting of thrombocytopenia.  The patient and his wife are both aware of the increased risk associated with PCI due to lesion complexity,  bleeding risk, and sources of thrombocytopenia.  Dr. Excell Seltzer seen and examined the patient today and feels he is stable for discharge.  DISCHARGE LABS:  Sodium 137, potassium 3.9, chloride 105, CO2 28, glucose 134, BUN 16, creatinine 0.97.  STUDIES: 1. Cardiac catheterization, August 30, 2011, see full report for     details. 2. CT of the head, August 28, 2011, negative. 3. Chest x-ray, August 28, 2011, showed stable low lung volumes,     cardiomegaly, and central pulmonary vascular congestion without     acute findings. 4. 2-D echocardiogram, please see full report for details.  DISCHARGE MEDICATIONS: 1. Aspirin 81 mg daily. 2. Metoprolol tartrate 25 mg half tablet b.i.d. 3. Plavix 75  mg daily. 4. NovoLog 12 units this is on hold for now.  He is instructed to     follow up with his PCP as his blood sugars were variable here in     the hospital. 5. Oxycodone 5 mg q.6 hours p.r.n., moderate-to-severe pain.  Per     discussion with Dr. Excell Seltzer, we will substitute oxycodone for     Percocet in light of the liver disease and Tylenol interaction. 6. Cyclobenzaprine 10 mg daily as needed. 7. Levemir 25 units daily at bedtime with instructions to only take     half dose the night before his catheterization. 8. Levothyroxine 50 mcg daily. 9. Lidoderm 5% one patch transdermally every 12 hours. 10.Multivitamin one tablet daily. 11.Nystatin topical one application topically t.i.d. 12.Zolpidem 10 mg at bedtime p.r.n.  DISPOSITION:  Mr. Pry will be discharged in stable condition to home. He is not to lift anything over 5 pounds for one week.  He is to follow low-sodium heart-healthy diabetic diet and keep the cath site clean and dry, and call us for any problems.  He will follow up with short stay at 6:30 a.m. on Wednesday, September 07, 2011 for repeat PCI.  DURATION OF DISCHARGE ENCOUNTER:  Greater than 30 minutes including physician and PA time.     Ronie Spies, P.A.C.   ______________________________ Veverly Fells. Excell Seltzer, MD    DD/MEDQ  D:  08/31/2011  T:  08/31/2011  Job:  [id number]  Electronically Signed by Tonny Bollman MD on 08/31/2011 10:35:27 PM Electronically Signed by Ronie Spies  on 09/06/2011 03:11:57 PM

## 2011-09-01 NOTE — Consult Note (Signed)
NAMESHAI, MCKENZIE NO.:  [id number]  MEDICAL RECORD NO.:  [id number]  LOCATION:  2505                         FACILITY:  [facility]  PHYSICIAN:  Kerin Perna, M.D.  DATE OF BIRTH:  [birth year]  DATE OF CONSULTATION:  08/30/2011 DATE OF DISCHARGE:                                CONSULTATION   PHYSICIAN REQUESTING CONSULTATION:  Veverly Fells. Excell Seltzer, MD  PRIMARY GASTROENTEROLOGIST:  Rachael Fee, MD  REASON FOR CONSULTATION:  Severe multivessel coronary artery disease with abnormal stress test.  CHIEF COMPLAINT:  Disorientation.  HISTORY OF PRESENT ILLNESS:  I was asked to evaluate this 60 year old obese, Caucasian, diabetic, nonsmoker for possible multivessel coronary artery bypass grafting after being recently diagnosed by cardiac cath a day, is having significant multivessel coronary artery disease.  The patient was scheduled to undergo right hip surgery and a Cardiology clearance was requested by Dr. Magnus Ivan.  The stress test showed abnormality in the inferior and apical regions.  He was scheduled for a 2-D echo, which showed normal LV function with mild aortic sclerosis. The patient was schedule to undergo cardiac cath, but he was admitted to the hospital with altered mental status on August 28, 2011.  It was felt that he was encephalopathic from his chronic advanced age liver failure, diabetes, and possible polypharmacy for pain control of his hip.  On admission, his cardiac enzymes were positive with a troponin of 6 and a CPK-MB of 24.  His mental status cleared and he underwent cardiac catheterization today by Dr. Excell Seltzer.  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  The left main has no significant disease.  The circumflex had mild disease.  A ventriculogram was not performed, but the  left ventricular end-diastolic pressure was measured at 23 mmHg.  Because of his coronary artery disease, positive stress test and diabetes, a thoracic surgical evaluation was requested for possible bypass grafting.  PAST MEDICAL HISTORY: 1. 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  The patient has been hospitalized twice now with     encephalopathy since last summer.  He has had documented ammonia     levels greater than 80 nanogram/mL.  He is on maintenance     lactulose.  His albumin is low at 2.2 and his bilirubin baseline is     also 2.4.  His baseline INR is 1.7, and a CT scan of the abdomen     performed last summer and was hospitalized for cirrhosis and     encephalopathy demonstrated a contracted nodular liver.  A liver     biopsy was considered apparently, but not performed.  His spleen is     also significantly large, almost as large as liver.  There was no     documented history of varices, but the patient probably has portal     hypertension.  His liver enzymes are only mildly elevated.  His     platelet count, however, is low at 70,000 and his  DIC panel shows     positive D-dimer chronically. 2. Sleep apnea, on CPAP. 3. Hypothyroidism. 4. Morbid obesity with a BMI of 42. 5. Osteoarthritis of the right hip with severe hip pain. 6. Dyslipidemia. 7. Chronic kidney insufficiency and the patient developed acute renal     failure last summer when he was taking nonsteroidals and his     creatinine went up to 3 and his BUN to 110, which is now improved. 8. Hypertension. 9. Pancytopenia.  HOME MEDICATIONS: 1. NovoLog insulin. 2. Multivitamin. 3. Levemir. 4. Insulin 25 units nightly. 5. Flexeril. 6. Lactulose. 7. Spironolactone 50 b.i.d. 8. Lasix 80 mg b.i.d. 9. Synthroid 50 mcg daily. 10.Oxycodone p.r.n. pain.  SOCIAL HISTORY:  The patient is involved with music ministry.  He does not drink or smoke.  He lives with his wife.  FAMILY  HISTORY:  Positive for diabetes and negative for premature coronary artery disease, MI, stroke, or coronary artery surgery.  REVIEW OF SYSTEMS:  CONSTITUTIONAL:  Significant for very low performance level due to his obesity and hip replacement and multiple medical problems.  He denies any recent fever or weight loss.  He denies any previous major surgical procedures.  He is right-hand dominant.  He has had no thoracic trauma or lower extremity trauma.  He has had no history of stroke and the head CT scan for his altered mental status showed no significant abnormality.  PHYSICAL EXAMINATION:  VITAL SIGNS:  Temperature 97.9, blood pressure 97/60, pulse 88 sinus, saturation on CPAP 95% on room air.  He is 135 kg and 6 feet tall. GENERAL APPEARANCE:  That of a obese Caucasian male in his hospital telemetry room following cardiac cath, wearing a CPAP and in no acute distress.  He is responsive and breathing comfortably, lying supine 30- degree head of the bed elevation. HEENT:  Normocephalic.  Pupils are equal.  Dentition is good. NECK:  Without JVD, mass, or bruit. LYMPHATICS:  Show no palpable adenopathy of the neck or supraclavicular fossa.  Thorax is without deformity.  Breath sounds are somewhat remote, but without wheeze. CARDIAC:  Regular rhythm without S3 gallop but he does have a 2/6 systolic ejection murmur consistent with his diagnosis of aortic sclerosis by echo. ABDOMEN:  Obese, probable ascites.  No pulsatile mass appreciated.  No focal tenderness. EXTREMITIES:  Reveal edema up to the midcalf with extensive stasis changes and dermatitis. NEUROLOGIC:  Nonfocal.  LABORATORY DATA:  I reviewed his coronary arteriograms from cath, his 2- D echo, his previous CT scan abdomen, and his multiple previous blood tests.  IMPRESSION AND RECOMMENDATIONS:  The patient is a 60 year old diabetic with significant two-vessel disease, would be probably best treated with surgery; however,  with his severe comorbid problems including his end- stage liver disease with thrombocytopenia and his obesity and sleep apnea and the venous stasis changes of his lower extremities.  I doubt that he would benefit from surgery or survive surgery.  I will speak with his cardiologist, Dr. Excell Seltzer regarding possible percutaneous intervention of the right coronary artery and possibly the left anterior descending artery.  This was discussed directly with the patient and his wife and they understand the situation in the fact that a surgical procedure lasting 5-6 hours with probable use of cardiopulmonary bypass would significantly worsened his liver status, his coagulopathy, and he would be at high risk for major morbidity, mortality.     Kerin Perna, M.D.     PV/MEDQ  D:  08/30/2011  T:  08/31/2011  Job:  [id number]  cc:   Veverly Fells. Excell Seltzer, MD Vanita Panda. Magnus Ivan, M.D. Rachael Fee, MD  Electronically Signed by Kerin Perna M.D. on 09/01/2011 09:31:22 AM

## 2011-09-02 ENCOUNTER — Encounter: Payer: Self-pay | Admitting: *Deleted

## 2011-09-03 LAB — CULTURE, BLOOD (ROUTINE X 2): Culture  Setup Time: 201209302132

## 2011-09-05 ENCOUNTER — Telehealth: Payer: Self-pay | Admitting: Cardiovascular Disease

## 2011-09-05 NOTE — Telephone Encounter (Signed)
I called the main cath lab and pt is scheduled for PCI on 09/07/11 at 8:30am Pt is aware of appt and time to be at short stay 6-6:30 per cath lab. His labs were last drawn on 08/31/11 in the hospital. He will take 1/2 Levemir dose the night before.  He understands npo after midnight and will take am meds except pt may hold his 80mg  am lasix prior to procedure. Mylo Red RN

## 2011-09-05 NOTE — Telephone Encounter (Signed)
Pt was calling because he was to get 2stents placed and he has not heard anything about the time he needs to be there so he was calling to find out what time he needed to be there and please give patient a call today to let him know where he stands with this so he can make plans if needed

## 2011-09-07 ENCOUNTER — Ambulatory Visit (HOSPITAL_COMMUNITY)
Admission: RE | Admit: 2011-09-07 | Discharge: 2011-09-08 | Disposition: A | Discharge status: Home / Self Care | Payer: 59 | Source: Ambulatory Visit | Attending: Cardiovascular Disease | Admitting: Cardiovascular Disease

## 2011-09-07 DIAGNOSIS — I2510 Atherosclerotic heart disease of native coronary artery without angina pectoris: Secondary | ICD-10-CM | POA: Insufficient documentation

## 2011-09-07 DIAGNOSIS — G4733 Obstructive sleep apnea (adult) (pediatric): Secondary | ICD-10-CM | POA: Insufficient documentation

## 2011-09-07 DIAGNOSIS — N183 Chronic kidney disease, stage 3 unspecified: Secondary | ICD-10-CM | POA: Insufficient documentation

## 2011-09-07 DIAGNOSIS — I214 Non-ST elevation (NSTEMI) myocardial infarction: Secondary | ICD-10-CM | POA: Insufficient documentation

## 2011-09-07 DIAGNOSIS — I129 Hypertensive chronic kidney disease with stage 1 through stage 4 chronic kidney disease, or unspecified chronic kidney disease: Secondary | ICD-10-CM | POA: Insufficient documentation

## 2011-09-07 DIAGNOSIS — E119 Type 2 diabetes mellitus without complications: Secondary | ICD-10-CM | POA: Insufficient documentation

## 2011-09-07 DIAGNOSIS — D696 Thrombocytopenia, unspecified: Secondary | ICD-10-CM | POA: Insufficient documentation

## 2011-09-07 DIAGNOSIS — K7460 Unspecified cirrhosis of liver: Secondary | ICD-10-CM | POA: Insufficient documentation

## 2011-09-07 LAB — BASIC METABOLIC PANEL
BUN: 48 mg/dL — ABNORMAL HIGH (ref 6–23)
CO2: 28 mEq/L (ref 19–32)
Calcium: 9.6 mg/dL (ref 8.4–10.5)
Creatinine, Ser: 1.37 mg/dL — ABNORMAL HIGH (ref 0.50–1.35)
Glucose, Bld: 149 mg/dL — ABNORMAL HIGH (ref 70–99)

## 2011-09-07 LAB — GLUCOSE, CAPILLARY
Glucose-Capillary: 128 mg/dL — ABNORMAL HIGH (ref 70–99)
Glucose-Capillary: 290 mg/dL — ABNORMAL HIGH (ref 70–99)

## 2011-09-07 LAB — CBC
MCH: 32.8 pg (ref 26.0–34.0)
MCV: 88.8 fL (ref 78.0–100.0)
Platelets: 111 10*3/uL — ABNORMAL LOW (ref 150–400)
RBC: 3.66 MIL/uL — ABNORMAL LOW (ref 4.22–5.81)

## 2011-09-07 LAB — POCT ACTIVATED CLOTTING TIME: Activated Clotting Time: 496 seconds

## 2011-09-08 ENCOUNTER — Telehealth: Payer: Self-pay | Admitting: Gastroenterology

## 2011-09-08 HISTORY — PX: CARDIAC CATHETERIZATION: SHX172

## 2011-09-08 LAB — GLUCOSE, CAPILLARY: Glucose-Capillary: 129 mg/dL — ABNORMAL HIGH (ref 70–99)

## 2011-09-08 LAB — BASIC METABOLIC PANEL
BUN: 40 mg/dL — ABNORMAL HIGH (ref 6–23)
Chloride: 99 mEq/L (ref 96–112)
GFR calc Af Amer: 75 mL/min — ABNORMAL LOW (ref >90)
Potassium: 4.2 mEq/L (ref 3.5–5.1)
Sodium: 136 mEq/L (ref 135–145)

## 2011-09-08 LAB — CBC
HCT: 26.4 % — ABNORMAL LOW (ref 39.0–52.0)
Hemoglobin: 9.4 g/dL — ABNORMAL LOW (ref 13.0–17.0)
RDW: 14.6 % (ref 11.5–15.5)
WBC: 4.5 10*3/uL (ref 4.0–10.5)

## 2011-09-08 NOTE — Telephone Encounter (Signed)
Appt made and letter mailed with date and time

## 2011-09-08 NOTE — H&P (Signed)
NAMEJIMI, [name] NO.:  [id number]  MEDICAL RECORD NO.:  [id number]  LOCATION:  4728                         FACILITY:  [facility]  PHYSICIAN:  [name], MDDATE OF BIRTH:  December 28, 1950  DATE OF ADMISSION:  08/28/2011 DATE OF DISCHARGE:                             HISTORY & PHYSICAL   PRIMARY CARE PHYSICIAN:  Collin Spikes, DO  CHIEF COMPLAINT:  Altered mental status and right hip pain.  HISTORY OF PRESENT ILLNESS:  [name] is a 60 year old gentle who was brought to the emergency department by his wife secondary to altered mental status.  The patient's family reports that 1 day prior to admission he was feeling weak, generalized malaise and mildly depressed after knowing that he his hip replacement is going to be delayed secondary to further workup on his heart before surgery.  On the day of admission in the morning, the patient's wife noted that the patient was not acting as his usual at the moment that he lives to work and when she returned from the patient to be obtunded and really unable to communicate properly.  The patient was brought into the emergency department.  In the emergency department, he was found to be oriented intermittently just to person.  Initial workup did not demonstrate any acute abnormalities and [facility] was called to admit the patient for further evaluation and treatment.  Of note, the patient has had stress tests done approximately a week prior to admission which was reportedly abnormal, he is planning to go to see [facility] for further evaluation/recommendations.  The patient also had a history of known alcoholic cirrhosis followed by [name] and according to family members has been compliant with his medications.  It is important to mention that the patient was in charge of taking his medications and that he had been prescribed narcotics to try to help controlling the pain of his right hip that has  been bothering him a lot.  ALLERGIES:  The patient is allergic to CRESTOR and also ORLISTAT.  PAST MEDICAL HISTORY:  Significant for diabetes type 2, currently insulin dependent, obstructive sleep apnea on CPAP, hypothyroidism, morbidly obesity, right hip pain secondary to osteoarthritis, grade 2 diastolic dysfunction, dyslipidemia, chronic kidney disease stage II, and hypertension.  MEDICATIONS:  Family is no 100% sure the patient is currently obtunded to provide full information about the medication that he is on open records. 1. He is supposed to be using Lidoderm 5% transdermally one patch     every 12 hours. 2. NovoLog sliding scale with meals. 3. Multivitamins 1 tablet by mouth daily. 4. Levemir 25 units daily at bedtime. 5. Flexeril 10 mg 1 tablet daily as needed. 6. Lactulose 30 mL 3 times a day as needed. 7. Spirolactone 50 mg 1 tablet twice daily. 8. Lasix 80 mg 1 tablet twice daily. 9. Zolpidem 10 mg 1 tablet daily at bedtime. 10.Levothyroxine 50 mcg 1 tablet daily. 11.Oxycodone 5 mg 1 capsule twice daily as needed for pain.  SOCIAL HISTORY:  The patient lives with his wife.  No tobacco, no alcohol, no drugs.  FAMILY HISTORY:  Significant for diabetes and also obesity.  REVIEW  OF SYSTEMS:  Unable to be performed since the patient was currently sleeping, lethargic, unable to participate in the medical interview, but per emergency department records is negative except as otherwise noted in the HPI.  PHYSICAL EXAMINATION:  VITAL SIGNS:  Demonstrated a temperature of 98.4, heart rate 113, respiratory rate 18, blood pressure 114/74, and oxygen saturation 99% on 2 liters. GENERAL:  The patient was lying in bed, currently in no acute distress. There are history per nurses in the floor when the patient was transferred from the ED that the patient was combative and agitated. HEENT:  Normocephalic.  No trauma.  Eyes, PERRLA.  No ectasia.  No nystagmus.  Moist mucous  membranes.  No erythema or exudates inside the mouth. NECK:  Supple.  No thyromegaly. RESPIRATORY SYSTEM:  Good air movement bilaterally.  No wheezing.  No crackles. HEART:  Positive systolic murmur, S1 and S2 mild tachycardia. ABDOMEN:  No tenderness.  Positive bowel sounds.  Soft, obese. EXTREMITIES:  1+ edema bilaterally up to mid calf and also with extensive dermatitis. SKIN:  No rash.  No petechiae. NEUROLOGIC:  The patient was obtunded, but able to answer yes or no to couple of questions.  There was no particular neurologic focal deficit.  LABORATORY DATA:  Demonstrated CK of 703, CK-MB 8.8.  Troponin less than 0.30.  Liver function with a bilirubin of 2.4, alkaline phosphatase 70, AST 45, ALT 21, albumin 3.3, procalcitonin 0.10.  ProBNP 405.2.  UDS negative urine urinalysis with no acute abnormalities.  Lipase was 17, alcohol less than 11.  BMET with a sodium of 136, potassium 4.0, chloride 97, bicarb 27, glucose 179, BUN 42, creatinine 1.7, lactic acid 2.8.  CBC with a white blood cells of 3.5, hemoglobin 11.6, platelets 83.  Ammonia level was 36, PTT 39.  PT 18.1 with an INR of 1.47.  A CT of the head demonstrated no acute intracranial abnormalities and a chest x-ray demonstrated stable low lung volumes, cardiomegaly, and central pulmonary vascular congestion without any acute findings.  ASSESSMENT AND PLAN: 1. Altered mental status in a patient with known alcoholic cirrhosis,     most likely a combination of polypharmacy due to the narcotics and     benzodiazepines that he used home and also received in the     hospital, in the emergency department in order to come his     agitation down.  Another consideration will be metabolic     encephalopathy due to his cirrhosis.  We are going to admit the     patient to telemetry bed.  We are going to check a TSH, we will go     ahead and check a vitamin B12 level.  We will check a RPR.  If the     patient failed to improve after  observe again over the next 24-36     hours.  We will get an MRI of the brain.  We are going to start the     patient on rifaximin IV and we will follow neuro checks q.6 h. 2. If the patient's abnormal stress test as an outpatient, we will     check cardiac enzymes.  The patient admitted on telemetry.  Based     on findings, we will get Cardiology to see him while he is in the     hospital.. 3. Diabetes.  We are going to have CBGs q.4 h. while he is n.p.o. and     use a sliding scale sensitive.  4. Plan is to balance diet, back to a modified carb and adjust his     insulin requirements.  We will check a hemoglobin A1c. 5. Dyslipidemia.  We will check a fasting lipid profile.  Depending     results we will start the patient on treatment if needed. 6. The patient's cirrhosis right now obtunded to take any of his     medications by mouth.  We will hold on spironolactone and Lasix.     We will start the patient on IV rifaximin once the patient is able     to take p.o.'s.  We are going to restart his medications. 7. Chronic kidney disease stage II, most likely secondary to     hypertension and diabetes, stable.  We are going to follow his     serum creatinine. 8. Hypothyroidism.  We will check a TSH and will continue Synthroid     initially through his veins while n.p.o. with further adjustment by     mouth as needed.  With further adjustment of his dose by mouth as     needed. 9. Agitation.  At this point, the patient is resting comfortable, but     if needed we are going to use Haldol and also restraints.  Family     member has been updated of the situation and they are in agreement     with plan.  The patient is a full code.  TIME SPENT ON THIS ADMISSION:  50 minutes.     Collin Randy, MD     CEM/MEDQ  D:  08/28/2011  T:  08/28/2011  Job:  [id number]  cc:   Collin Spikes, DO  Electronically Signed by Vassie Loll MD on 09/08/2011 08:05:13 AM

## 2011-09-08 NOTE — Telephone Encounter (Signed)
[name], he needs rov with me in next 3-4 weeks to get him ready for hip surgery, make sure cirrhosis meds are optimal.  thanks

## 2011-09-15 ENCOUNTER — Other Ambulatory Visit: Payer: Self-pay | Admitting: Orthopaedic Surgery

## 2011-09-15 ENCOUNTER — Telehealth: Payer: Self-pay | Admitting: Cardiovascular Disease

## 2011-09-15 DIAGNOSIS — M25551 Pain in right hip: Secondary | ICD-10-CM

## 2011-09-15 NOTE — Telephone Encounter (Signed)
[name] orthopedic physician, (Dr Magnus Ivan),  is requesting that [name] have a steroid injection at [facility].  [facility] is requesting that [name] stop his Plavix for 5 days prior to the injection.  [name] states that Dr Magnus Ivan didn't think stopping the Plavix was necessary but [facility] is insisting.  [name] is requesting Dr Earmon Phoenix opinion.  The injection has not been scheduled yet.   [name] would like Dr Excell Seltzer to call him regarding this matter.

## 2011-09-15 NOTE — Telephone Encounter (Signed)
Pt calling re going off med before injection

## 2011-09-16 ENCOUNTER — Ambulatory Visit
Admission: RE | Admit: 2011-09-16 | Discharge: 2011-09-16 | Disposition: A | Discharge status: Home / Self Care | Payer: 59 | Source: Ambulatory Visit | Attending: Orthopaedic Surgery | Admitting: Orthopaedic Surgery

## 2011-09-16 DIAGNOSIS — M25551 Pain in right hip: Secondary | ICD-10-CM

## 2011-09-16 MED ORDER — METHYLPREDNISOLONE ACETATE 40 MG/ML INJ SUSP (RADIOLOG
120.0000 mg | Freq: Once | INTRAMUSCULAR | Status: AC
  Administered 2011-09-16: 120 mg via INTRA_ARTICULAR

## 2011-09-16 MED ORDER — IOHEXOL 180 MG/ML  SOLN
1.0000 mL | Freq: Once | INTRAMUSCULAR | Status: AC | PRN
  Administered 2011-09-16: 1 mL via INTRA_ARTICULAR

## 2011-09-16 NOTE — Telephone Encounter (Signed)
He CANNOT stop plavix for 30 days from his procedure. The earliest he can stop plavix is 10/08/2011.

## 2011-09-16 NOTE — Consult Note (Signed)
NAMELEONARDO, MAKRIS NO.:  [id number]  MEDICAL RECORD NO.:  [id number]  LOCATION:  4728                         FACILITY:  [facility]  PHYSICIAN:  Armanda Magic, M.D.     DATE OF BIRTH:  09/12/1951  DATE OF CONSULTATION:  08/28/2011 DATE OF DISCHARGE:                                CONSULTATION   REFERRING PHYSICIAN:  Bufford Spikes, DO, [facility].  PRIMARY CARDIOLOGIST:  [place].  REASON FOR CONSULTATION:  Abnormal cardiac enzymes and abnormal exercise treadmill and abnormal nuclear stress test.  HISTORY OF PRESENT ILLNESS:  This is a 60 year old male with a history of type 2 diabetes mellitus, obstructive sleep apnea on CPAP, hypothyroidism, marked obesity, chronic right hip pain secondary to arthritis and diastolic dysfunction as well as chronic kidney disease stage II, who presented to the emergency room with complaints of altered mental status.  He apparently had been weak with generalized malaise days in the past couple of days and in the ER was obtunded and unable to communicate.  Apparently, his nuclear stress test was done at [facility] and was reportedly abnormal and he was set up to see a  cardiologist this week prior to his surgery for preoperative clearance for hip surgery.  The patient is somewhat obtunded, so it is difficult to get an accurate history from him.  His wife is present.  PAST MEDICAL HISTORY: 1. Type 2 diabetes mellitus. 2. Obstructive sleep apnea, on CPAP. 3. Hypothyroidism. 4. Morbid obesity. 5. Right hip pain secondary to osteoarthritis. 6. Grade 2 diastolic dysfunction. 7. Dyslipidemia. 8. Chronic kidney disease, stage II. 9. Hypertension. 10.Recent admit for acute metabolic encephalopathy secondary to NASH     with cirrhosis and cirrhosis secondary to NASH. 11.Pancytopenia.  ALLERGIES:  CRESTOR and XENICAL.  MEDICATIONS: 1. NovoLog insulin sliding scale. 2. Multivitamin. 3. Levemir insulin 25 units  nightly. 4. Flexeril 10 mg p.r.n. 5. Lactulose 30 mL t.i.d. p.r.n. 6. Spironolactone 50 mg b.i.d. 7. Lasix 80 mg b.i.d. 8. Zolpidem 10 mg nightly. 9. Levothyroxine 50 mcg daily. 10.Oxycodone 5 mg b.i.d. p.r.n.  SOCIAL HISTORY:  He lives with his wife.  He denies any tobacco or alcohol use.  No IV drug use.  FAMILY HISTORY:  Significant for diabetes.  REVIEW OF SYSTEMS:  Really unable to obtain because of the patient's mental status changes.  PHYSICAL EXAMINATION:  VITAL SIGNS:  Blood pressure is 114/74.  Heart rate 113.  He is afebrile.  O2 saturations 99% on 2 liters. GENERAL:  A well-developed, obese white male, somewhat obtunded. HEENT:  Benign. NECK:  Supple without lymphadenopathy.  Carotid upstrokes are +2 bilaterally.  No bruits. LUNGS:  Clear to auscultation anteriorly. HEART:  Regular rate and rhythm with 2/6 systolic murmur at the left lower sternal border. ABDOMEN:  Soft, nontender, and nondistended. EXTREMITIES:  +2 edema bilaterally.  LABS:  Sodium 136, potassium 4, chloride 99, BUN 42, creatinine 1.72, glucose 179, and CO2 of 27.  White cell count 3.5, hemoglobin 11.6, hematocrit 31.2, and platelet count 83.  Sed rate 73.  Total bili 2.6, alkaline phosphatase 73, AST 42, ALT 24, albumin 3.5, calcium 9.6, phosphorus 3, magnesium 2.1.  Procalcitonin 0.1.  BNP 405.  INR 1.47. Ammonia 35, lactate 73.  CPKs are 703 and 837, MB 8.8 and 23.2, relative index 1.3 and 2.8.  Troponin is less than 0.3 and 2.81.  ABG on room air showed pH 7.46, pCO2 of 38, O2 of 81, and bicarb 28.  Head CT was negative for bleed.  Chest x-ray showed low lung volumes with cardiomegaly and central pulmonary vascular congestion.  Two-D echocardiogram done in June 2012 showed normal LV function, EF 55-60% with mild LVH, grade 2 diastolic dysfunction, mild AS, mild MR, reduced right ventricular function, and trivial PE.  EKG shows sinus tachycardia with nonspecific ST changes.  ASSESSMENT: 1.  Mental status change with obtundation.  He had a similar     presentation recently, felt secondary to metabolic encephalopathy     from his cirrhosis for nonalcoholic steatohepatitis and renal     failure. 2. Elevated CPK-MB with normal relative index and 1/2 troponins     elevated questionable significance, possibly rhabdomyolysis in the     setting of underlying renal insufficiency.  He has been lying     around in bed per his wife for the past couple of days.  He did     recently though have an abnormal nuclear stress test done and     workup through the [facility] cardiology is pending for preoperative     clearance. 3. Elevated sed rate, questionable significance. 4. Cirrhosis secondary to nonalcoholic steatohepatitis. 5. Thrombocytopenia.  PLAN:  Recommend neuro consult if mental status does not improve. Recheck 2-D echocardiogram to assess for regional wall motion abnormalities.  No IV heparin at this time given the patient's low platelet count.  He has not had any chest pain or shortness of breath per his wife and there are no acute ischemic changes on his EKG. Further workup of abnormal stress test and abnormal enzymes per [facility].     Armanda Magic, M.D.     TT/MEDQ  D:  08/29/2011  T:  08/29/2011  Job:  [id number]  cc:   [facility]  Electronically Signed by Armanda Magic M.D. on 09/16/2011 08:22:51 PM

## 2011-09-16 NOTE — Telephone Encounter (Signed)
I spoke with the pt and made him aware of this information.  I will fax this phone note to [phone] [facility].

## 2011-09-18 NOTE — Cardiovascular Report (Signed)
[name], [name] NO.:  [id number]  MEDICAL RECORD NO.:  [id number]  LOCATION:  2504                         FACILITY:  [facility]  PHYSICIAN:  Veverly Fells. Excell Seltzer, MD  DATE OF BIRTH:  October 16, 1951  DATE OF PROCEDURE:  09/07/2011 DATE OF DISCHARGE:                           CARDIAC CATHETERIZATION   PROCEDURE: 1. PTCA and stenting of the right coronary artery. 2. PTCA and stenting of the LAD. 3. Perclose of the right femoral artery.  PROCEDURAL INDICATION:  [name] is a 60 year old gentleman with extensive coronary disease.  He presented last week with non-ST elevation infarction.  He has multivessel disease and was turned down for cardiac surgery because of severe comorbidities.  The patient has advanced hepatic cirrhosis, thrombocytopenia, marked limitation in activity, and longstanding diabetes.  After review of his films and discussion about potential plans for hip surgery, we elected to proceed with 2 vessel percutaneous intervention using bare metal stent platforms.  The patient has debilitating right hip pain and needs to have hip replacement surgery which has been planned and now delayed because of his cardiac problems.  Risks and indications of the procedure were reviewed with the patient in detail.  Informed consent was obtained.  The right groin was prepped and draped and anesthetized with 1% lidocaine.  Using modified Seldinger technique, a 6-French sheath was placed in the right femoral artery via a front wall puncture.  A JR-4 guide catheter was used for the right coronary artery.  The patient had been adequately preloaded on aspirin and Plavix.  Angiomax was used for anticoagulation.  A JR4 guide catheter was inserted.  Once a therapeutic ACT was achieved, a Cougar guidewire was advanced into the distal RCA.  The vessel was pre-dilated with a 2.25 x 15 mm Emerge balloon to 10 atmospheres maximum pressure. Multiple views were obtained of the right  coronary artery.  There was segmental disease, but the critical lesion was in the proximal vessel. This was stented with a 2.75 x 18 mm Multi-Link Vision stent which was taken to 12 atmospheres and appeared well expanded.  The stent was then post dilated with a 3.0 x 15 mm Ballplay Quantum apex balloon.  This was dilated to 16 atmospheres.  There was an excellent angiographic result with no significant residual stenosis.  Attention was then turned to the LAD.  Initially, an XBLAD 3.5 guide was utilized.  This was selective in the circumflex, and it was difficult to image the LAD.  I tried an XBLAD 4.0, but this was even more selective in the circumflex.  I went back to an XBLAD 3.5 cm guide catheter, and I was able to wire the lesion in the proximal LAD with the same Cougar guidewire.  There was an aneurysmal segment just beyond the area of severe stenosis, and then there was marked tortuosity.  This was moderately difficult to wire, but ultimately was successful, the lesion site was pre-dilated with a 2.25 x 15 mm Emerge balloon to 12atmospheres.  The balloon was used to assess lesion length.  The lesion was very focal, and I ultimately treated it with a 3.0 x 12 mm Multi- Link Vision bare  metal stent.  The stent was deployed at 14 atmospheres and appeared fairly well expanded.  The stent balloon ruptured during stent deployment, but this was without clinical consequence.  The stent was then post dilated with a 3.25 x 8 mm Ketchum Quantum apex which was taken to 16 atmospheres on 2 inflations.  There was an excellent angiographic result.  The stent appeared well expanded with 0% residual stenosis. There were no immediate complications, and the patient tolerated the procedure well.  The guide catheter was removed and a Perclose device was used for femoral hemostasis.  The device appeared to deploy successfully but there continued to be pulsatile flow and manual pressure is now being used for  hemostasis.  There is no current evidence of hematoma, and the patient is hemodynamically stable.  ASSESSMENT:  Successful two-vessel percutaneous coronary intervention using a bare metal stent platforms in the right coronary artery and the left anterior descending artery.  RECOMMENDATIONS:  The patient should receive aspirin and Plavix for a minimum of 30 days.  He would then be allowed a period of Plavix washout and hopefully can proceed with hip surgery following that time in approximately 5 to 6 weeks.     Veverly Fells. Excell Seltzer, MD     MDC/MEDQ  D:  09/07/2011  T:  09/07/2011  Job:  [id number]  cc:   Vanita Panda. Magnus Ivan, M.D. Kerin Perna, M.D. Bufford Spikes, DO Rachael Fee, MD  Electronically Signed by Tonny Bollman MD on 09/18/2011 01:15:33 AM

## 2011-09-18 NOTE — Discharge Summary (Signed)
NAMEDEMAREA, LOREY NO.:  [id number]  MEDICAL RECORD NO.:  [id number]  LOCATION:  2504                         FACILITY:  [facility]  PHYSICIAN:  Veverly Fells. Excell Seltzer, MD  DATE OF BIRTH:  [birth year]  DATE OF ADMISSION:  09/07/2011 DATE OF DISCHARGE:                              DISCHARGE SUMMARY   PROCEDURES: 1. Cardiac catheterization. 2. Percutaneous transluminal coronary angioplasty and 3.0 x 12 mm bare-     metal vision stent to the left anterior descending. 3. 2.75 x 18 mm Vision bare-metal stent to the right coronary artery.  FINAL DISCHARGE DIAGNOSIS:  Coronary artery disease with recent non ST- segment elevation myocardial infarction.  SECONDARY DIAGNOSES: 1. Diabetes. 2. 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 3. Obstructive sleep apnea on CPAP. 4. Hypothyroidism. 5. Morbid obesity. 6. Osteoarthritis. 7. Dyslipidemia. 8. Chronic kidney disease, stage III. 9. Hypertension. 10.History of pancytopenia, hemoglobin 9.4, hematocrit 26.4, WBC 4.5,     and platelets 84 at discharge. 11.Allergy or intolerance to Crestor and Orlistat. 12.Preserved left ventricular function with an ejection fraction of     50% to 55% and mild mitral regurgitation as well as aortic     sclerosis without stenosis by echocardiogram on August 29, 2011. 13.hypothyroidism.  TIME AT DISCHARGE:  36 minutes.  HOSPITAL COURSE:  Mr. Bochicchio is a 60 year old male with a recent diagnosis of coronary artery disease.  He was hospitalized with a non ST-segment elevation MI from August 27, 2009, to August 31, 2011.  Staged intervention was felt the best option and he came to the hospital for this on September 07, 2011.  Mr. Pardini had the bare-metal stents described above to the RCA and LAD reducing each stenosis to 0.  He tolerated the procedure well.  He was continued on his home CPAP overnight.  On September 08, 2011, Dr. Excell Seltzer  evaluated Mr. Gervasi.  He was ambulating without chest pain or shortness of breath.  His groin is without ecchymosis or hematoma.  Discussion was had with the patient regarding his need for an upcoming hip replacement.  Dr. Excell Seltzer recommends continuing aspirin and Plavix for 30 days.  After that, he is cleared to hold the Plavix for 5 days prior to surgery, but is to continue on aspirin at 81 mg a day if at all possible.  He is to follow up with Dr. Magnus Ivan and schedule the surgery at Dr. Eliberto Ivory convenient.  Dr. Christella Hartigan was contacted and made aware of the plan.  If his BMET is without significant changes, Mr. Suess is considered stable for discharge in improved condition.  DISCHARGE INSTRUCTIONS:  His activity level is to be increased gradually.  Heart-healthy diabetic diet.  He is to call our office for problems with the cath site.  He is to follow up with Dr. Excell Seltzer on September 26, 2011, at 10 a.m.  He is to follow up with Dr. Renato Gails as needed.  He is to see Dr. Magnus Ivan in 2-3 weeks.  He is to see Dr. Christella Hartigan as needed.  DISCHARGE MEDICATIONS: 1. Oxycodone 5 mg q.6 h. p.r.n. 2. Nystatin topical cream t.i.d. 3. Lidocaine  patch q.12 h. 4. Ambien 10 mg at bedtime p.r.n. 5. Flexeril 10 mg daily p.r.n. 6. Levemir insulin 25 units at bedtime. 7. Lasix 80 mg a day. 8. Spironolactone 50 mg b.i.d. 9. Multivitamin daily. 10.Aspirin 81 mg a day. 11.Plavix 75 mg a day. 12.Levothyroxine 50 mcg daily. 13.Sublingual nitroglycerin p.r.n.     Theodore Demark, PA-C   ______________________________ Veverly Fells. Excell Seltzer, MD    RB/MEDQ  D:  09/08/2011  T:  09/08/2011  Job:  [id number]  cc:   Bufford Spikes, DO Rachael Fee, MD Abigail Miyamoto, M.D.  Electronically Signed by Theodore Demark PA-C on 09/15/2011 06:26:18 AM Electronically Signed by Tonny Bollman MD on 09/18/2011 01:15:36 AM

## 2011-09-26 ENCOUNTER — Ambulatory Visit (INDEPENDENT_AMBULATORY_CARE_PROVIDER_SITE_OTHER): Payer: 59 | Admitting: Cardiovascular Disease

## 2011-09-26 ENCOUNTER — Encounter: Payer: Self-pay | Admitting: Cardiovascular Disease

## 2011-09-26 VITALS — BP 110/62 | HR 79 | Resp 20 | Ht 69.0 in | Wt 290.1 lb

## 2011-09-26 DIAGNOSIS — I10 Essential (primary) hypertension: Secondary | ICD-10-CM

## 2011-09-26 DIAGNOSIS — E785 Hyperlipidemia, unspecified: Secondary | ICD-10-CM

## 2011-09-26 DIAGNOSIS — I2510 Atherosclerotic heart disease of native coronary artery without angina pectoris: Secondary | ICD-10-CM | POA: Insufficient documentation

## 2011-09-26 LAB — CBC WITH DIFFERENTIAL/PLATELET
Basophils Relative: 0.3 % (ref 0.0–3.0)
Eosinophils Absolute: 0.1 10*3/uL (ref 0.0–0.7)
Eosinophils Relative: 1.7 % (ref 0.0–5.0)
HCT: 33.2 % — ABNORMAL LOW (ref 39.0–52.0)
Hemoglobin: 11.4 g/dL — ABNORMAL LOW (ref 13.0–17.0)
Lymphs Abs: 0.8 10*3/uL (ref 0.7–4.0)
MCHC: 34.4 g/dL (ref 30.0–36.0)
MCV: 97.3 fl (ref 78.0–100.0)
Monocytes Absolute: 0.5 10*3/uL (ref 0.1–1.0)
Neutro Abs: 3.6 10*3/uL (ref 1.4–7.7)
Neutrophils Relative %: 71.5 % (ref 43.0–77.0)
RBC: 3.42 Mil/uL — ABNORMAL LOW (ref 4.22–5.81)
WBC: 5.1 10*3/uL (ref 4.5–10.5)

## 2011-09-26 LAB — BASIC METABOLIC PANEL
CO2: 30 mEq/L (ref 19–32)
Chloride: 96 mEq/L (ref 96–112)
Creatinine, Ser: 1.2 mg/dL (ref 0.4–1.5)
Potassium: 4.4 mEq/L (ref 3.5–5.1)

## 2011-09-26 NOTE — Assessment & Plan Note (Signed)
Well-controlled. The patient is tolerating a low dose beta blocker.

## 2011-09-26 NOTE — Progress Notes (Signed)
HPI:  [name] presents for followup evaluation. The patient is a 60 year old gentleman with hepatic cirrhosis, chronic edema, morbid obesity, chronic hip pain, and recent non-ST elevation myocardial infarction. The patient was hospitalized for altered mental status thought to be related to analgesic medications in the setting of cirrhosis. He had a significant non-ST elevation infarction associated with this. He did not have any chest pain or tightness. However, the preceding week he had undergone a Myoview stress scan that showed high risk features of multi-territory ischemia. He ultimately underwent cardiac catheterization demonstrating severe 2 vessel coronary artery disease of the LAD and right coronary artery. His left ventricular ejection fraction was preserved. A surgical consultation was done for consideration of coronary bypass surgery but he was considered to be risk prohibitive. After much discussion, we elected to bring him in for 2 vessel percutaneous coronary intervention. Successful PCI of the right coronary artery and LAD were performed utilizing bare-metal stent platforms. Bare metal stents were chosen so that the patient can have right hip surgery in the near future. He has severe disability related to his hip pain.  The patient has had a single episode of chest discomfort since discharge from hospital. He took a sublingual nitroglycerin with relief. He denies any other cardiac related problems. The episode lasted a matter of minutes. He denies orthopnea, PND, or exertional dyspnea. He has had lower extremity edema which is been a chronic problem. He had a great deal of relief from a steroid injection in the right hip but his pain has now returned and continues to be severe.  Outpatient Encounter Prescriptions as of 09/26/2011  Medication Sig Dispense Refill  . cyclobenzaprine (FLEXERIL) 10 MG tablet Take 10 mg by mouth 3 (three) times daily as needed.        . furosemide (LASIX) 80 MG  tablet One by mouth twice a day       . HYDROcodone-acetaminophen (NORCO) 7.5-325 MG per tablet Prn at bedtime       . insulin aspart (NOVOLOG) 100 UNIT/ML injection Inject 12 Units into the skin 3 (three) times daily before meals.        . insulin detemir (LEVEMIR) 100 UNIT/ML injection Inject 25 Units into the skin at bedtime.        Marland Kitchen lactulose (CHRONULAC) 10 GM/15ML solution Take 20 g by mouth 2 (two) times daily. Dosage unknown and takes as prescribed for ammonia levels.      Marland Kitchen levothyroxine (SYNTHROID, LEVOTHROID) 50 MCG tablet Take 50 mcg by mouth daily.        Marland Kitchen spironolactone (ALDACTONE) 50 MG tablet Take 50 mg by mouth 2 (two) times daily.        Marland Kitchen zolpidem (AMBIEN CR) 6.25 MG CR tablet Take 6.25 mg by mouth at bedtime as needed.          Allergies  Allergen Reactions  . Statins     Past Medical History  Diagnosis Date  . Hypertension   . Obstructive sleep apnea   . Type II or unspecified type diabetes mellitus without mention of complication, not stated as uncontrolled   . Thyroid disease   . Cirrhosis, non-alcoholic   . Morbid obesity   . Pancytopenia   . Splenomegaly   . Fatty liver     ROS: Negative except as per HPI  There were no vitals taken for this visit.  PHYSICAL EXAM: Pt is alert and oriented, morbidly obese male in NAD HEENT: normal Neck: JVP - normal, carotids 2+=  without bruits Lungs: CTA bilaterally CV: RRR with a grade 2/6 harsh systolic murmur at the right upper sternal border Abd: soft, NT, Positive BS Ext: 1+ pretibial and pedal edema bilaterally, distal pulses intact and equal Skin: warm/dry no rash  EKG:  Normal sinus rhythm, possible inferior infarct age undetermined, anteroseptal infarct age undetermined, heart rate 79 beats per minute, no significant ST or T wave changes.  ASSESSMENT AND PLAN:

## 2011-09-26 NOTE — Patient Instructions (Signed)
Your physician recommends that you have lab work today: BMP and CBC  Your physician recommends that you schedule a follow-up appointment in: 3 MONTHS  Your physician recommends that you continue on your current medications as directed. Please refer to the Current Medication list given to you today.  Your last dose of Plavix will be October 07, 2011.

## 2011-09-26 NOTE — Assessment & Plan Note (Signed)
The patient has had recent non-ST elevation infarction. He was treated with multivessel stenting utilizing bare-metal stents. He remains on aspirin and Plavix. His stop date for Plavix is November 9 (this will complete a 30 day course). He could have surgery after that. The patient is at moderate risk of surgery in the setting of his obesity and coronary artery disease on a background of liver cirrhosis. However, his risk is been mitigated as much as possible with treatment of his coronary artery disease. He will see Dr. Christella Hartigan before surgery to optimize his medications for cirrhosis. He remains on both furosemide and Aldactone. I will check blood work today to include a CBC and metabolic panel.

## 2011-09-26 NOTE — Assessment & Plan Note (Signed)
Last LDL was 107. He is not on a statin drug because of liver disease.

## 2011-09-27 ENCOUNTER — Telehealth: Payer: Self-pay | Admitting: Cardiovascular Disease

## 2011-09-27 NOTE — Telephone Encounter (Signed)
Pt is calling about test results please call

## 2011-09-27 NOTE — Telephone Encounter (Signed)
I attempted to reach the pt two times but his phone line is busy.

## 2011-09-28 ENCOUNTER — Telehealth: Payer: Self-pay | Admitting: *Deleted

## 2011-09-28 NOTE — Telephone Encounter (Signed)
Advised of lab results 

## 2011-09-28 NOTE — Telephone Encounter (Signed)
Message copied by Burnell Blanks on Wed Sep 28, 2011 10:52 AM ------      Message from: Tonny Bollman      Created: Mon Sep 26, 2011  5:28 PM       Labs are all improved from baseline. No med changes recommended

## 2011-09-28 NOTE — Progress Notes (Signed)
Advised of lab results 

## 2011-09-28 NOTE — Telephone Encounter (Signed)
Called patient and advised of labs.

## 2011-09-28 NOTE — Telephone Encounter (Signed)
Pt called back about test results and he said his phone line is messed up please call at this number

## 2011-10-04 ENCOUNTER — Ambulatory Visit: Payer: 59 | Admitting: Gastroenterology

## 2011-10-05 ENCOUNTER — Other Ambulatory Visit (HOSPITAL_COMMUNITY): Payer: Self-pay | Admitting: Orthopaedic Surgery

## 2011-10-05 ENCOUNTER — Encounter (HOSPITAL_COMMUNITY): Payer: Self-pay | Admitting: Pharmacy Technician

## 2011-10-06 ENCOUNTER — Other Ambulatory Visit (HOSPITAL_COMMUNITY): Payer: Self-pay | Admitting: Orthopaedic Surgery

## 2011-10-07 ENCOUNTER — Ambulatory Visit (HOSPITAL_COMMUNITY)
Admission: RE | Admit: 2011-10-07 | Discharge: 2011-10-07 | Disposition: A | Discharge status: Home / Self Care | Payer: 59 | Source: Ambulatory Visit | Attending: Orthopaedic Surgery | Admitting: Orthopaedic Surgery

## 2011-10-07 ENCOUNTER — Encounter (HOSPITAL_COMMUNITY): Payer: 59

## 2011-10-07 ENCOUNTER — Encounter (HOSPITAL_COMMUNITY): Payer: Self-pay

## 2011-10-07 DIAGNOSIS — Z01812 Encounter for preprocedural laboratory examination: Secondary | ICD-10-CM | POA: Insufficient documentation

## 2011-10-07 DIAGNOSIS — Z01818 Encounter for other preprocedural examination: Secondary | ICD-10-CM | POA: Insufficient documentation

## 2011-10-07 LAB — COMPREHENSIVE METABOLIC PANEL
BUN: 38 mg/dL — ABNORMAL HIGH (ref 6–23)
CO2: 33 mEq/L — ABNORMAL HIGH (ref 19–32)
Calcium: 10.2 mg/dL (ref 8.4–10.5)
Creatinine, Ser: 1.37 mg/dL — ABNORMAL HIGH (ref 0.50–1.35)
GFR calc Af Amer: 63 mL/min — ABNORMAL LOW (ref >90)
Glucose, Bld: 199 mg/dL — ABNORMAL HIGH (ref 70–99)
Sodium: 133 mEq/L — ABNORMAL LOW (ref 135–145)
Total Protein: 7.4 g/dL (ref 6.0–8.3)

## 2011-10-07 LAB — APTT: aPTT: 40 seconds — ABNORMAL HIGH (ref 24–37)

## 2011-10-07 LAB — URINALYSIS, ROUTINE W REFLEX MICROSCOPIC
Bilirubin Urine: NEGATIVE
Hgb urine dipstick: NEGATIVE
Protein, ur: NEGATIVE mg/dL
Urobilinogen, UA: 0.2 mg/dL (ref 0.0–1.0)

## 2011-10-07 LAB — CBC
MCH: 32.6 pg (ref 26.0–34.0)
Platelets: 99 10*3/uL — ABNORMAL LOW (ref 150–400)
RBC: 3.71 MIL/uL — ABNORMAL LOW (ref 4.22–5.81)
WBC: 4.9 10*3/uL (ref 4.0–10.5)

## 2011-10-07 LAB — PROTIME-INR
INR: 1.37 (ref 0.00–1.49)
Prothrombin Time: 17.1 seconds — ABNORMAL HIGH (ref 11.6–15.2)

## 2011-10-07 LAB — SURGICAL PCR SCREEN: MRSA, PCR: NEGATIVE

## 2011-10-07 NOTE — Patient Instructions (Signed)
19 [name]  10/07/2011   Your procedure is scheduled on:10/14/11  Report to [facility] at 8:30 AM.  Call this number if you have problems the morning of surgery: [phone]   Remember:   Do not eat food:After Midnight.  Do not drink clear liquids: After Midnight.  Take these medicines the morning of surgery with A SIP OF WATER: SYNTHROID / TOPROL XL / OXYCODONE - TAKE 1/2 DOSE OF INSULIN THE NIGHT BEFORE SURGERY   Do not wear jewelry, make-up or nail polish.  Do not wear lotions, powders, or perfumes. You may wear deodorant.  Do not shave 48 hours prior to surgery.  Do not bring valuables to the hospital.  Contacts, dentures or bridgework may not be worn into surgery.  Leave suitcase in the car. After surgery it may be brought to your room.  For patients admitted to the hospital, checkout time is 11:00 AM the day of discharge.   Patients discharged the day of surgery will not be allowed to drive home.  Name and phone number of your driver  Special Instructions: CHG Shower Use Special Wash: 1/2 bottle night before surgery and 1/2 bottle morning of surgery.   Please read over the following fact sheets that you were given: MRSA Information

## 2011-10-12 ENCOUNTER — Ambulatory Visit (INDEPENDENT_AMBULATORY_CARE_PROVIDER_SITE_OTHER): Payer: 59 | Admitting: Gastroenterology

## 2011-10-12 ENCOUNTER — Encounter: Payer: Self-pay | Admitting: Gastroenterology

## 2011-10-12 DIAGNOSIS — K7460 Unspecified cirrhosis of liver: Secondary | ICD-10-CM

## 2011-10-12 MED ORDER — SPIRONOLACTONE 50 MG PO TABS: 100.0000 mg | ORAL_TABLET | Freq: Two times a day (BID) | ORAL | Status: DC

## 2011-10-12 NOTE — Progress Notes (Signed)
Review of pertinent gastrointestinal problems:  1. Cirrhosis: Likely from fatty liver, perhaps cardiac related  Labs July 2012: ANA negative, HIV negative, iron studies normal, hepatitis B. surface antigen negative, hepatitis B core antibody negative, hepatitis C antibody negative, hepatitis A IgM negative. Anti-mitochondrial antibody negative, hepatitis B surface antibody negative, hepatitis a total antibody negative, ceruloplasmin normal.  Most recent imaging: CT scan June 2012 showed pleural effusion, ascites, cirrhosis and splenomegaly (non-IV contrast due to renal insufficiency). .  Most recent EGD: September 2012 found mild nonspecific gastritis but no varices  Most recet AFP aug 2012 normal 2. Routine risk for colon cancer, colonoscopy September 2012 was normal. Next colonoscopy at 10 year interval   HPI: This is a  very pleasant [name] whom I last saw about 2 months ago. He is here with his daughter today. In the interim he had an acute myocardial infarction and underwent stenting with non-drug-eluting stents.  Planning for have Hip surgery in 3 days.  He stopped his plavix 3-4 days ago in anticipation. The past few days have been very painful from hip perspective. He's been breathing well.    For the past week he's been on lasix 80 bid and aldactone 50 bid.  Past Medical History  Diagnosis Date  . Hypertension   . Type II or unspecified type diabetes mellitus without mention of complication, not stated as uncontrolled   . Thyroid disease   . Cirrhosis, non-alcoholic   . Morbid obesity   . Pancytopenia   . Fatty liver   . Coronary artery disease   . Myocardial infarction     "HEART INCIDENT " OCT 2012  . Angina     RARE USE OF NTG  . CHF (congestive heart failure)     JUNE 2012  . Arthritis   . Gout 35 YRS AGO  . Cellulitis of left leg   . Cirrhosis of liver     NONALCOHOLIC  . Nocturia   . Elevated BUN   . Elevated serum creatinine   . Serum ammonia increased    . Splenomegaly   . Platelets decreased   . Thyroid disorder     PT DOES NOT KNOW IF LOW OR HIGH  . Obstructive sleep apnea     USES O2 WITH C - PAP SETTING FOR C-PA IS 16    Past Surgical History  Procedure Date  . Appendectomy   . Coronary stent placement     2 CARDIAC STENTS OCT 08/2011    Current Outpatient Prescriptions  Medication Sig Dispense Refill  . aspirin 81 MG tablet Take 81 mg by mouth every morning.       . furosemide (LASIX) 80 MG tablet Take 80 mg by mouth 2 (two) times daily.       . insulin detemir (LEVEMIR) 100 UNIT/ML injection Inject 25 Units into the skin at bedtime.       Marland Kitchen lactulose (CHRONULAC) 10 GM/15ML solution Take 20 g by mouth 3 (three) times daily as needed. CONSTIPATION      . levothyroxine (SYNTHROID, LEVOTHROID) 50 MCG tablet Take 50 mcg by mouth every morning.       . lidocaine (LIDODERM) 5 % Place 1 patch onto the skin daily as needed. Remove & Discard patch within 12 hours or as directed by MD..PAIN      . metoprolol succinate (TOPROL-XL) 25 MG 24 hr tablet 12.5 mg every morning.       . Multiple Vitamin (MULTIVITAMIN) tablet Take 1 tablet by  mouth daily.        . nitroGLYCERIN (NITROSTAT) 0.4 MG SL tablet Place 0.4 mg under the tongue every 5 (five) minutes as needed. CHEST PAIN      . nystatin (MYCOSTATIN) powder Apply topically daily as needed. ITCH       . oxycodone (OXY-IR) 5 MG capsule Take 5 mg by mouth every 4 (four) hours as needed. PAIN       . spironolactone (ALDACTONE) 50 MG tablet Take 50 mg by mouth 2 (two) times daily.         Allergies as of 10/12/2011 - Review Complete 10/12/2011  Allergen Reaction Noted  . Statins Other (See Comments) 07/12/2011    Family History  Problem Relation Age of Onset  . Colon cancer Neg Hx   . Diabetes Mother   . Diabetes Father     History   Social History  . Marital Status: Married    Spouse Name: N/A    Number of Children: 1  . Years of Education: N/A   Occupational History  .  Not on file.   Social History Main Topics  . Smoking status: Never Smoker   . Smokeless tobacco: Never Used  . Alcohol Use: No  . Drug Use: No  . Sexually Active: Not on file   Other Topics Concern  . Not on file   Social History Narrative  . No narrative on file      Physical Exam: BP 98/58  Pulse 82  Ht 5\' 9"  (1.753 m) Constitutional: morbidly obese Psychiatric: alert and oriented x3 Abdomen: soft, nontender, nondistended, no obvious ascites, no peritoneal signs, normal bowel sounds 1-2+ pitting edema in his ankles    Assessment and plan: 60 y.o. male with cirrhosis  He has a bit more than usual fluid in his ankles and so I am going to increase his spironolactone to 100 mg twice daily. I have made that change in his medication list and I have given him a new prescription as well. He is going to undergo hip surgery in 3 days and I think there is a good chance that he will have issues from his liver around the time of his surgery.

## 2011-10-12 NOTE — Patient Instructions (Signed)
Increase your aldactone to 100mg  twice daily Keep the lasix at 80mg  twice daily. Return to see Dr. Christella Hartigan a few weeks after your surgery, please call to make that appointment. If you have trouble with your liver around the time of your hip surgery your orthopedic team should call [facility] for help.

## 2011-10-13 MED ORDER — CEFAZOLIN SODIUM-DEXTROSE 2-3 GM-% IV SOLR
2.0000 g | INTRAVENOUS | Status: AC
  Administered 2011-10-14: 2 g via INTRAVENOUS
  Filled 2011-10-13: qty 50

## 2011-10-13 NOTE — H&P (Signed)
[name] is an 60 y.o. male.   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  He has failed rest, walking with assistive devices and steroid injections.  He has multiple medical problems and severe cardiac issues.  He has been cleared for surgery by Dr. Tonny Bollman his Cardiologist and understands fully the significant risks involved.  However his right hip pain is so severe and his quality of life so poor, he wishes to proceed with surgery inspight of the risks.  Past Medical History  Diagnosis Date  . Hypertension   . Type II or unspecified type diabetes mellitus without mention of complication, not stated as uncontrolled   . Thyroid disease   . Cirrhosis, non-alcoholic   . Morbid obesity   . Pancytopenia   . Fatty liver   . Coronary artery disease   . Myocardial infarction     "HEART INCIDENT " OCT 2012  . Angina     RARE USE OF NTG  . CHF (congestive heart failure)     JUNE 2012  . Arthritis   . Gout 35 YRS AGO  . Cellulitis of left leg   . Cirrhosis of liver     NONALCOHOLIC  . Nocturia   . Elevated BUN   . Elevated serum creatinine   . Serum ammonia increased   . Splenomegaly   . Platelets decreased   . Thyroid disorder     PT DOES NOT KNOW IF LOW OR HIGH  . Obstructive sleep apnea     USES O2 WITH C - PAP SETTING FOR C-PA IS 16    Past Surgical History  Procedure Date  . Appendectomy   . Coronary stent placement     2 CARDIAC STENTS OCT 08/2011    Family History  Problem Relation Age of Onset  . Colon cancer Neg Hx   . Diabetes Mother   . Diabetes Father    Social History:  reports that he has never smoked. He has never used smokeless tobacco. He reports that he does not drink alcohol or use illicit drugs.  Allergies:  Allergies  Allergen Reactions  . Statins Other (See Comments)    PT DOES NOT REMEMBER    Medications Prior to Admission  Medication Dose Route Frequency Provider Last Rate  Last Dose  . ceFAZolin (ANCEF) IVPB 2 g/50 mL premix  2 g Intravenous On Call to OR Kathryne Hitch       Medications Prior to Admission  Medication Sig Dispense Refill  . aspirin 81 MG tablet Take 81 mg by mouth every morning.       . furosemide (LASIX) 80 MG tablet Take 80 mg by mouth 2 (two) times daily.       . insulin detemir (LEVEMIR) 100 UNIT/ML injection Inject 25 Units into the skin at bedtime.       Marland Kitchen lactulose (CHRONULAC) 10 GM/15ML solution Take 20 g by mouth 3 (three) times daily as needed. CONSTIPATION      . levothyroxine (SYNTHROID, LEVOTHROID) 50 MCG tablet Take 50 mcg by mouth every morning.       . lidocaine (LIDODERM) 5 % Place 1 patch onto the skin daily as needed. Remove & Discard patch within 12 hours or as directed by MD..PAIN      . metoprolol succinate (TOPROL-XL) 25 MG 24 hr tablet 12.5 mg every morning.       . Multiple Vitamin (MULTIVITAMIN) tablet  Take 1 tablet by mouth daily.        . nitroGLYCERIN (NITROSTAT) 0.4 MG SL tablet Place 0.4 mg under the tongue every 5 (five) minutes as needed. CHEST PAIN      . nystatin (MYCOSTATIN) powder Apply topically daily as needed. ITCH       . oxycodone (OXY-IR) 5 MG capsule Take 5 mg by mouth every 4 (four) hours as needed. PAIN         No results found for this or any previous visit (from the past 48 hour(s)). No results found.  Review of Systems  Constitutional: Negative.   HENT: Negative.   Eyes: Negative.   Respiratory: Negative.   Cardiovascular: Negative.   Gastrointestinal: Negative.   Genitourinary: Negative.   Musculoskeletal: Positive for joint pain.  Skin: Negative.   Endo/Heme/Allergies: Negative.   Psychiatric/Behavioral: Negative.     There were no vitals taken for this visit. Physical Exam  Constitutional: He is oriented to person, place, and time. He appears well-developed and well-nourished.  HENT:  Head: Normocephalic and atraumatic.  Eyes: Pupils are equal, round, and reactive to  light.  Neck: Normal range of motion. Neck supple.  Cardiovascular: Normal rate, regular rhythm, normal heart sounds and intact distal pulses.   GI: Soft. Bowel sounds are normal.  Musculoskeletal:       Right hip: He exhibits decreased range of motion, tenderness and crepitus.  Neurological: He is alert and oriented to person, place, and time.  Skin: Skin is warm.  Psychiatric: He has a normal mood and affect.     Assessment/Plan End-stage arthritis of the right hip 1) to the OR for a right direct anterior total hip replacement.  BLACKMAN,CHRISTOPHER Y 10/13/2011, 8:03 PM

## 2011-10-14 ENCOUNTER — Inpatient Hospital Stay (HOSPITAL_COMMUNITY): Payer: 59

## 2011-10-14 ENCOUNTER — Inpatient Hospital Stay (HOSPITAL_COMMUNITY)
Admission: RE | Admit: 2011-10-14 | Discharge: 2011-10-21 | DRG: 470 | Disposition: A | Discharge status: Skilled Nursing Facility | Payer: 59 | Source: Ambulatory Visit | Attending: Orthopaedic Surgery | Admitting: Orthopaedic Surgery

## 2011-10-14 ENCOUNTER — Inpatient Hospital Stay (HOSPITAL_COMMUNITY): Payer: 59 | Admitting: Anesthesiology

## 2011-10-14 ENCOUNTER — Encounter (HOSPITAL_COMMUNITY): Payer: Self-pay | Admitting: Anesthesiology

## 2011-10-14 ENCOUNTER — Encounter (HOSPITAL_COMMUNITY)
Admission: RE | Disposition: A | Discharge status: Skilled Nursing Facility | Payer: Self-pay | Source: Ambulatory Visit | Attending: Orthopaedic Surgery

## 2011-10-14 ENCOUNTER — Encounter (HOSPITAL_COMMUNITY): Payer: Self-pay | Admitting: *Deleted

## 2011-10-14 DIAGNOSIS — M1610 Unilateral primary osteoarthritis, unspecified hip: Principal | ICD-10-CM | POA: Diagnosis present

## 2011-10-14 DIAGNOSIS — Z9861 Coronary angioplasty status: Secondary | ICD-10-CM

## 2011-10-14 DIAGNOSIS — N289 Disorder of kidney and ureter, unspecified: Secondary | ICD-10-CM | POA: Diagnosis present

## 2011-10-14 DIAGNOSIS — I129 Hypertensive chronic kidney disease with stage 1 through stage 4 chronic kidney disease, or unspecified chronic kidney disease: Secondary | ICD-10-CM | POA: Diagnosis present

## 2011-10-14 DIAGNOSIS — N189 Chronic kidney disease, unspecified: Secondary | ICD-10-CM | POA: Diagnosis present

## 2011-10-14 DIAGNOSIS — I10 Essential (primary) hypertension: Secondary | ICD-10-CM | POA: Diagnosis present

## 2011-10-14 DIAGNOSIS — K7460 Unspecified cirrhosis of liver: Secondary | ICD-10-CM | POA: Diagnosis present

## 2011-10-14 DIAGNOSIS — M169 Osteoarthritis of hip, unspecified: Principal | ICD-10-CM | POA: Diagnosis present

## 2011-10-14 DIAGNOSIS — D649 Anemia, unspecified: Secondary | ICD-10-CM | POA: Diagnosis present

## 2011-10-14 DIAGNOSIS — G4733 Obstructive sleep apnea (adult) (pediatric): Secondary | ICD-10-CM | POA: Diagnosis present

## 2011-10-14 DIAGNOSIS — E119 Type 2 diabetes mellitus without complications: Secondary | ICD-10-CM | POA: Diagnosis present

## 2011-10-14 DIAGNOSIS — I2510 Atherosclerotic heart disease of native coronary artery without angina pectoris: Secondary | ICD-10-CM | POA: Diagnosis present

## 2011-10-14 DIAGNOSIS — K760 Fatty (change of) liver, not elsewhere classified: Secondary | ICD-10-CM | POA: Diagnosis present

## 2011-10-14 HISTORY — PX: TOTAL HIP ARTHROPLASTY: SHX124

## 2011-10-14 LAB — GLUCOSE, CAPILLARY: Glucose-Capillary: 109 mg/dL — ABNORMAL HIGH (ref 70–99)

## 2011-10-14 LAB — PREPARE RBC (CROSSMATCH)

## 2011-10-14 LAB — POCT I-STAT 7, (LYTES, BLD GAS, ICA,H+H)
Acid-Base Excess: 7 mmol/L — ABNORMAL HIGH (ref 0.0–2.0)
Bicarbonate: 30.2 mEq/L — ABNORMAL HIGH (ref 20.0–24.0)
HCT: 27 % — ABNORMAL LOW (ref 39.0–52.0)
O2 Saturation: 100 %
Patient temperature: 36.5
pCO2 arterial: 35.3 mmHg (ref 35.0–45.0)
pO2, Arterial: 319 mmHg — ABNORMAL HIGH (ref 80.0–100.0)

## 2011-10-14 LAB — ABO/RH: ABO/RH(D): A POS

## 2011-10-14 SURGERY — ARTHROPLASTY, HIP, TOTAL, ANTERIOR APPROACH
Anesthesia: General | Site: Hip | Laterality: Right | Wound class: Clean

## 2011-10-14 SURGERY — ARTHROPLASTY, HIP, TOTAL,POSTERIOR APPROACH
Anesthesia: General | Laterality: Right

## 2011-10-14 MED ORDER — METOCLOPRAMIDE HCL 5 MG/ML IJ SOLN: 5.0000 mg | Freq: Three times a day (TID) | INTRAMUSCULAR | Status: DC | PRN

## 2011-10-14 MED ORDER — BISACODYL 5 MG PO TBEC
10.0000 mg | DELAYED_RELEASE_TABLET | Freq: Every day | ORAL | Status: DC | PRN
  Filled 2011-10-14: qty 1

## 2011-10-14 MED ORDER — ACETAMINOPHEN 650 MG RE SUPP: 650.0000 mg | Freq: Four times a day (QID) | RECTAL | Status: DC | PRN

## 2011-10-14 MED ORDER — METHOCARBAMOL 500 MG PO TABS
500.0000 mg | ORAL_TABLET | Freq: Four times a day (QID) | ORAL | Status: DC | PRN
  Administered 2011-10-16 – 2011-10-21 (×13): 500 mg via ORAL
  Filled 2011-10-14 (×13): qty 1

## 2011-10-14 MED ORDER — MORPHINE SULFATE 2 MG/ML IJ SOLN
1.0000 mg | INTRAMUSCULAR | Status: DC | PRN
Start: 2011-10-14 — End: 2011-10-21
  Administered 2011-10-16 – 2011-10-18 (×8): 1 mg via INTRAVENOUS
  Filled 2011-10-14 (×8): qty 1

## 2011-10-14 MED ORDER — DIPHENHYDRAMINE HCL 12.5 MG/5ML PO ELIX: 12.5000 mg | ORAL_SOLUTION | ORAL | Status: DC | PRN

## 2011-10-14 MED ORDER — SPIRONOLACTONE 100 MG PO TABS
100.0000 mg | ORAL_TABLET | Freq: Two times a day (BID) | ORAL | Status: DC
  Administered 2011-10-14 – 2011-10-16 (×4): 100 mg via ORAL
  Filled 2011-10-14 (×7): qty 1

## 2011-10-14 MED ORDER — MAGNESIUM HYDROXIDE 400 MG/5ML PO SUSP: 30.0000 mL | Freq: Two times a day (BID) | ORAL | Status: DC | PRN

## 2011-10-14 MED ORDER — HYDROCODONE-ACETAMINOPHEN 5-325 MG PO TABS
1.0000 | ORAL_TABLET | ORAL | Status: DC | PRN
  Administered 2011-10-15: 1 via ORAL
  Filled 2011-10-14 (×2): qty 1

## 2011-10-14 MED ORDER — MORPHINE SULFATE (PF) 1 MG/ML IV SOLN
INTRAVENOUS | Status: DC
  Administered 2011-10-14: 14:00:00 via INTRAVENOUS
  Administered 2011-10-14: 14 mg via INTRAVENOUS
  Administered 2011-10-15: 5 mg via INTRAVENOUS
  Administered 2011-10-15: 7 mg via INTRAVENOUS
  Administered 2011-10-15: 5.91 mg via INTRAVENOUS
  Filled 2011-10-14 (×2): qty 25

## 2011-10-14 MED ORDER — PROPOFOL 10 MG/ML IV EMUL
INTRAVENOUS | Status: DC | PRN
  Administered 2011-10-14: 100 mg via INTRAVENOUS

## 2011-10-14 MED ORDER — ONDANSETRON HCL 4 MG PO TABS: 4.0000 mg | ORAL_TABLET | Freq: Four times a day (QID) | ORAL | Status: DC | PRN

## 2011-10-14 MED ORDER — ONE-DAILY MULTI VITAMINS PO TABS
1.0000 | ORAL_TABLET | Freq: Every day | ORAL | Status: DC
  Filled 2011-10-14 (×2): qty 1

## 2011-10-14 MED ORDER — DOCUSATE SODIUM 100 MG PO CAPS
100.0000 mg | ORAL_CAPSULE | Freq: Two times a day (BID) | ORAL | Status: DC
  Administered 2011-10-14 – 2011-10-15 (×2): 100 mg via ORAL
  Filled 2011-10-14 (×5): qty 1

## 2011-10-14 MED ORDER — METOPROLOL SUCCINATE 12.5 MG HALF TABLET: 12.5000 mg | ORAL_TABLET | Freq: Every day | ORAL | Status: DC

## 2011-10-14 MED ORDER — SUCCINYLCHOLINE CHLORIDE 20 MG/ML IJ SOLN
INTRAMUSCULAR | Status: DC | PRN
  Administered 2011-10-14: 160 mg via INTRAVENOUS

## 2011-10-14 MED ORDER — CHLORHEXIDINE GLUCONATE 4 % EX LIQD: 60.0000 mL | Freq: Once | CUTANEOUS | Status: DC

## 2011-10-14 MED ORDER — ONDANSETRON HCL 4 MG/2ML IJ SOLN: 4.0000 mg | Freq: Four times a day (QID) | INTRAMUSCULAR | Status: DC | PRN

## 2011-10-14 MED ORDER — ASPIRIN 81 MG PO CHEW
81.0000 mg | CHEWABLE_TABLET | Freq: Every day | ORAL | Status: DC
  Administered 2011-10-15 – 2011-10-21 (×7): 81 mg via ORAL
  Filled 2011-10-14 (×7): qty 1

## 2011-10-14 MED ORDER — FUROSEMIDE 10 MG/ML IJ SOLN
80.0000 mg | Freq: Two times a day (BID) | INTRAMUSCULAR | Status: DC
  Administered 2011-10-14 – 2011-10-15 (×2): 80 mg via INTRAVENOUS
  Filled 2011-10-14 (×3): qty 8

## 2011-10-14 MED ORDER — ALUM & MAG HYDROXIDE-SIMETH 200-200-20 MG/5ML PO SUSP: 30.0000 mL | ORAL | Status: DC | PRN

## 2011-10-14 MED ORDER — MIDAZOLAM HCL 5 MG/5ML IJ SOLN
INTRAMUSCULAR | Status: DC | PRN
  Administered 2011-10-14: 2 mg via INTRAVENOUS

## 2011-10-14 MED ORDER — METOPROLOL SUCCINATE 12.5 MG HALF TABLET
12.5000 mg | ORAL_TABLET | Freq: Every day | ORAL | Status: DC
  Administered 2011-10-15 – 2011-10-21 (×7): 12.5 mg via ORAL
  Filled 2011-10-14 (×7): qty 1

## 2011-10-14 MED ORDER — HYDROMORPHONE HCL PF 1 MG/ML IJ SOLN
INTRAMUSCULAR | Status: DC | PRN
  Administered 2011-10-14 (×2): 0.5 mg via INTRAVENOUS
  Administered 2011-10-14: 1 mg via INTRAVENOUS

## 2011-10-14 MED ORDER — FENTANYL CITRATE 0.05 MG/ML IJ SOLN
INTRAMUSCULAR | Status: DC | PRN
  Administered 2011-10-14 (×5): 50 ug via INTRAVENOUS

## 2011-10-14 MED ORDER — LACTATED RINGERS IV SOLN: INTRAVENOUS | Status: DC

## 2011-10-14 MED ORDER — ONDANSETRON HCL 4 MG/2ML IJ SOLN
INTRAMUSCULAR | Status: DC | PRN
  Administered 2011-10-14: 4 mg via INTRAVENOUS

## 2011-10-14 MED ORDER — PHENYLEPHRINE HCL 10 MG/ML IJ SOLN
10.0000 mg | INTRAVENOUS | Status: DC | PRN
  Administered 2011-10-14: 40 ug/min via INTRAVENOUS

## 2011-10-14 MED ORDER — FUROSEMIDE 80 MG PO TABS
80.0000 mg | ORAL_TABLET | Freq: Two times a day (BID) | ORAL | Status: DC
  Filled 2011-10-14: qty 1

## 2011-10-14 MED ORDER — FUROSEMIDE 10 MG/ML IJ SOLN
INTRAMUSCULAR | Status: DC | PRN
  Administered 2011-10-14 (×2): 20 mg via INTRAMUSCULAR

## 2011-10-14 MED ORDER — BISACODYL 10 MG RE SUPP: 10.0000 mg | Freq: Every day | RECTAL | Status: DC | PRN

## 2011-10-14 MED ORDER — TEMAZEPAM 15 MG PO CAPS
15.0000 mg | ORAL_CAPSULE | Freq: Every evening | ORAL | Status: DC | PRN
  Administered 2011-10-15: 15 mg via ORAL
  Filled 2011-10-14: qty 1

## 2011-10-14 MED ORDER — ACETAMINOPHEN 325 MG PO TABS: 650.0000 mg | ORAL_TABLET | Freq: Four times a day (QID) | ORAL | Status: DC | PRN

## 2011-10-14 MED ORDER — RIVAROXABAN 10 MG PO TABS: 10.0000 mg | ORAL_TABLET | ORAL | Status: DC

## 2011-10-14 MED ORDER — SODIUM CHLORIDE 0.9 % IR SOLN
Status: DC | PRN
  Administered 2011-10-14: 1000 mL

## 2011-10-14 MED ORDER — FENTANYL CITRATE 0.05 MG/ML IJ SOLN
50.0000 ug | INTRAMUSCULAR | Status: DC | PRN
  Administered 2011-10-14: 100 ug via INTRAVENOUS

## 2011-10-14 MED ORDER — FERROUS SULFATE 325 (65 FE) MG PO TABS
325.0000 mg | ORAL_TABLET | Freq: Three times a day (TID) | ORAL | Status: DC
  Administered 2011-10-14 – 2011-10-21 (×21): 325 mg via ORAL
  Filled 2011-10-14 (×22): qty 1

## 2011-10-14 MED ORDER — STERILE WATER FOR IRRIGATION IR SOLN
Status: DC | PRN
  Administered 2011-10-14: 1500 mL

## 2011-10-14 MED ORDER — DIPHENHYDRAMINE HCL 50 MG/ML IJ SOLN: 12.5000 mg | Freq: Four times a day (QID) | INTRAMUSCULAR | Status: DC | PRN

## 2011-10-14 MED ORDER — FENTANYL CITRATE 0.05 MG/ML IJ SOLN
25.0000 ug | INTRAMUSCULAR | Status: DC | PRN
  Administered 2011-10-14 (×3): 25 ug via INTRAVENOUS

## 2011-10-14 MED ORDER — METHOCARBAMOL 100 MG/ML IJ SOLN
500.0000 mg | Freq: Four times a day (QID) | INTRAVENOUS | Status: DC | PRN
  Administered 2011-10-14 – 2011-10-17 (×7): 500 mg via INTRAVENOUS
  Filled 2011-10-14 (×7): qty 5

## 2011-10-14 MED ORDER — POLYETHYLENE GLYCOL 3350 17 G PO PACK
17.0000 g | PACK | Freq: Every day | ORAL | Status: DC | PRN
  Filled 2011-10-14: qty 1

## 2011-10-14 MED ORDER — MENTHOL 3 MG MT LOZG: 1.0000 | LOZENGE | OROMUCOSAL | Status: DC | PRN

## 2011-10-14 MED ORDER — SODIUM CHLORIDE 0.9 % IJ SOLN: 9.0000 mL | INTRAMUSCULAR | Status: DC | PRN

## 2011-10-14 MED ORDER — FLEET ENEMA 7-19 GM/118ML RE ENEM: 1.0000 | ENEMA | Freq: Every day | RECTAL | Status: DC | PRN

## 2011-10-14 MED ORDER — PHENOL 1.4 % MT LIQD: 1.0000 | OROMUCOSAL | Status: DC | PRN

## 2011-10-14 MED ORDER — LEVOTHYROXINE SODIUM 50 MCG PO TABS
50.0000 ug | ORAL_TABLET | ORAL | Status: DC
  Administered 2011-10-15 – 2011-10-21 (×7): 50 ug via ORAL
  Filled 2011-10-14 (×8): qty 1

## 2011-10-14 MED ORDER — CEFAZOLIN SODIUM-DEXTROSE 2-3 GM-% IV SOLR
2.0000 g | Freq: Four times a day (QID) | INTRAVENOUS | Status: AC
  Administered 2011-10-14 – 2011-10-15 (×3): 2 g via INTRAVENOUS
  Filled 2011-10-14 (×4): qty 50

## 2011-10-14 MED ORDER — RIVAROXABAN 10 MG PO TABS
10.0000 mg | ORAL_TABLET | ORAL | Status: DC
Start: 2011-10-15 — End: 2011-10-16
  Administered 2011-10-16: 10 mg via ORAL
  Filled 2011-10-14 (×3): qty 1

## 2011-10-14 MED ORDER — OXYCODONE HCL 5 MG PO TABS
5.0000 mg | ORAL_TABLET | ORAL | Status: DC | PRN
  Administered 2011-10-16: 10 mg via ORAL
  Filled 2011-10-14: qty 2

## 2011-10-14 MED ORDER — ETOMIDATE 2 MG/ML IV SOLN
INTRAVENOUS | Status: DC | PRN
  Administered 2011-10-14: 10 mg via INTRAVENOUS

## 2011-10-14 MED ORDER — DIPHENHYDRAMINE HCL 12.5 MG/5ML PO ELIX
12.5000 mg | ORAL_SOLUTION | Freq: Four times a day (QID) | ORAL | Status: DC | PRN
  Filled 2011-10-14: qty 5

## 2011-10-14 MED ORDER — ROCURONIUM BROMIDE 100 MG/10ML IV SOLN
INTRAVENOUS | Status: DC | PRN
  Administered 2011-10-14: 40 mg via INTRAVENOUS
  Administered 2011-10-14: 10 mg via INTRAVENOUS

## 2011-10-14 MED ORDER — INSULIN DETEMIR 100 UNIT/ML ~~LOC~~ SOLN
25.0000 [IU] | Freq: Every day | SUBCUTANEOUS | Status: DC
  Administered 2011-10-14 – 2011-10-20 (×7): 25 [IU] via SUBCUTANEOUS
  Filled 2011-10-14: qty 3

## 2011-10-14 MED ORDER — LIDOCAINE HCL (CARDIAC) 20 MG/ML IV SOLN
INTRAVENOUS | Status: DC | PRN
  Administered 2011-10-14: 30 mg via INTRAVENOUS

## 2011-10-14 MED ORDER — SODIUM CHLORIDE 0.9 % IV SOLN
INTRAVENOUS | Status: DC
  Administered 2011-10-15: 08:00:00 via INTRAVENOUS

## 2011-10-14 MED ORDER — NALOXONE HCL 0.4 MG/ML IJ SOLN: 0.4000 mg | INTRAMUSCULAR | Status: DC | PRN

## 2011-10-14 MED ORDER — LACTATED RINGERS IV SOLN
INTRAVENOUS | Status: DC
  Administered 2011-10-14: 12:00:00 via INTRAVENOUS
  Administered 2011-10-14: 1000 mL via INTRAVENOUS

## 2011-10-14 MED ORDER — PROMETHAZINE HCL 25 MG/ML IJ SOLN: 6.2500 mg | INTRAMUSCULAR | Status: DC | PRN

## 2011-10-14 MED ORDER — METOCLOPRAMIDE HCL 10 MG PO TABS: 5.0000 mg | ORAL_TABLET | Freq: Three times a day (TID) | ORAL | Status: DC | PRN

## 2011-10-14 MED ORDER — THERA M PLUS PO TABS
1.0000 | ORAL_TABLET | Freq: Every day | ORAL | Status: DC
  Administered 2011-10-14 – 2011-10-17 (×4): 1 via ORAL
  Administered 2011-10-18 – 2011-10-19 (×2): via ORAL
  Administered 2011-10-20: 1 via ORAL
  Administered 2011-10-21: 11:00:00 via ORAL
  Filled 2011-10-14 (×8): qty 1

## 2011-10-14 SURGICAL SUPPLY — 35 items
BAG ZIPLOCK 12X15 (MISCELLANEOUS) ×4 IMPLANT
BLADE SAW SGTL 18X1.27X75 (BLADE) ×2 IMPLANT
CELLS DAT CNTRL 66122 CELL SVR (MISCELLANEOUS) ×1 IMPLANT
CLOTH BEACON ORANGE TIMEOUT ST (SAFETY) ×2 IMPLANT
DRAPE C-ARM 42X72 X-RAY (DRAPES) ×2 IMPLANT
DRAPE ORTHO SPLIT 77X108 STRL (DRAPES) ×1
DRAPE STERI IOBAN 125X83 (DRAPES) ×2 IMPLANT
DRAPE SURG ORHT 6 SPLT 77X108 (DRAPES) ×1 IMPLANT
DRAPE U-SHAPE 47X51 STRL (DRAPES) ×6 IMPLANT
DRSG MEPILEX BORDER 4X8 (GAUZE/BANDAGES/DRESSINGS) ×2 IMPLANT
ELECT BLADE TIP CTD 4 INCH (ELECTRODE) ×2 IMPLANT
ELECT REM PT RETURN 9FT ADLT (ELECTROSURGICAL) ×2
ELECTRODE REM PT RTRN 9FT ADLT (ELECTROSURGICAL) ×1 IMPLANT
EVACUATOR 1/8 PVC DRAIN (DRAIN) IMPLANT
FACESHIELD LNG OPTICON STERILE (SAFETY) ×8 IMPLANT
GAUZE XEROFORM 1X8 LF (GAUZE/BANDAGES/DRESSINGS) ×2 IMPLANT
GLOVE BIO SURGEON STRL SZ7 (GLOVE) ×2 IMPLANT
GLOVE BIO SURGEON STRL SZ7.5 (GLOVE) ×2 IMPLANT
GLOVE BIOGEL PI IND STRL 7.5 (GLOVE) IMPLANT
GLOVE BIOGEL PI IND STRL 8 (GLOVE) ×1 IMPLANT
GLOVE BIOGEL PI INDICATOR 7.5 (GLOVE)
GLOVE BIOGEL PI INDICATOR 8 (GLOVE) ×1
GOWN STRL REIN XL XLG (GOWN DISPOSABLE) ×2 IMPLANT
KIT BASIN OR (CUSTOM PROCEDURE TRAY) ×2 IMPLANT
PACK TOTAL JOINT (CUSTOM PROCEDURE TRAY) ×2 IMPLANT
PADDING CAST COTTON 6X4 STRL (CAST SUPPLIES) ×2 IMPLANT
RTRCTR WOUND ALEXIS 18CM MED (MISCELLANEOUS) ×2
STAPLER SKIN PROX WIDE 3.9 (STAPLE) IMPLANT
SUT ETHIBOND NAB CT1 #1 30IN (SUTURE) ×4 IMPLANT
SUT MNCRL AB 4-0 PS2 18 (SUTURE) ×2 IMPLANT
SUT VIC AB 1 CT1 36 (SUTURE) ×4 IMPLANT
SUT VIC AB 2-0 CT1 27 (SUTURE) ×3
SUT VIC AB 2-0 CT1 TAPERPNT 27 (SUTURE) ×3 IMPLANT
TOWEL OR 17X26 10 PK STRL BLUE (TOWEL DISPOSABLE) ×4 IMPLANT
TRAY FOLEY CATH 14FRSI W/METER (CATHETERS) ×2 IMPLANT

## 2011-10-14 NOTE — Preoperative (Signed)
Beta Blockers   Reason not to administer Beta Blockers:pt took metoprolol 10/14/11

## 2011-10-14 NOTE — Anesthesia Postprocedure Evaluation (Signed)
Anesthesia Post Note  Patient: [name]  Procedure(s) Performed:  TOTAL HIP ARTHROPLASTY ANTERIOR APPROACH  Anesthesia type: General  Patient location: PACU  Post pain: Pain level controlled  Post assessment: Post-op Vital signs reviewed  Last Vitals:  Filed Vitals:   10/14/11 1400  BP: 115/63  Pulse:   Temp: 36.2 C  Resp:     Post vital signs: Reviewed  Level of consciousness: sedated  Complications: No apparent anesthesia complications

## 2011-10-14 NOTE — Transfer of Care (Signed)
Immediate Anesthesia Transfer of Care Note  Patient: [name]  Procedure(s) Performed:  TOTAL HIP ARTHROPLASTY ANTERIOR APPROACH  Patient Location: PACU  Anesthesia Type: General  Level of Consciousness: awake, alert  and patient cooperative  Airway & Oxygen Therapy: Patient Spontanous Breathing and Patient connected to face mask oxygen  Post-op Assessment: Report given to PACU RN  Post vital signs: Reviewed and stable  Complications: No apparent anesthesia complications

## 2011-10-14 NOTE — Anesthesia Preprocedure Evaluation (Signed)
Anesthesia Evaluation  Patient identified by MRN, date of birth, ID band Patient awake    Reviewed: Allergy & Precautions, H&P , NPO status , Patient's Chart, lab work & pertinent test results, reviewed documented beta blocker date and time   History of Anesthesia Complications Negative for: history of anesthetic complications  Airway Mallampati: III TM Distance: >3 FB     Dental  (+) Teeth Intact and Dental Advisory Given   Pulmonary shortness of breath, with exertion and Long-Term Oxygen Therapy, sleep apnea, Continuous Positive Airway Pressure Ventilation and Oxygen sleep apnea ,  CPAP 16 clear to auscultation        Cardiovascular Exercise Tolerance: Poor hypertension, Pt. on medications and Pt. on home beta blockers + angina + CAD, + Past MI, +CHF and + DOE Regular Normal+ Systolic murmurs Bare metal stent X2 on 08/08/11; denies further episodes of chest pain, pressure following stents. Plavix discontinued 10/07/11. Grade 3/6 Systolic murmur.   Neuro/Psych Negative Neurological ROS  Negative Psych ROS   GI/Hepatic (+) Cirrhosis -    substance abuse   , Nonalcoholic cirrhosis of liver   Endo/Other  Diabetes mellitus-, Well Controlled, Type 2, Insulin DependentHypothyroidism Morbid obesity  Renal/GU   Genitourinary negative   Musculoskeletal negative musculoskeletal ROS (+)   Abdominal Normal abdominal exam  (+) obese,   Peds negative pediatric ROS (+)  Hematology negative hematology ROS (+)   Anesthesia Other Findings   Reproductive/Obstetrics negative OB ROS                           Anesthesia Physical Anesthesia Plan  ASA: III  Anesthesia Plan: General   Post-op Pain Management:    Induction: Intravenous  Airway Management Planned: Oral ETT  Additional Equipment: Arterial line  Intra-op Plan:   Post-operative Plan: Extubation in OR  Informed Consent: I have reviewed  the patients History and Physical, chart, labs and discussed the procedure including the risks, benefits and alternatives for the proposed anesthesia with the patient or authorized representative who has indicated his/her understanding and acceptance.   Dental advisory given  Plan Discussed with: CRNA  Anesthesia Plan Comments:         Anesthesia Quick Evaluation

## 2011-10-14 NOTE — Anesthesia Procedure Notes (Addendum)
Date/Time: 10/14/2011 10:43 AM Performed by: WRINKLE, DANA Pre-anesthesia Checklist: Patient identified and Patient being monitored Patient Re-evaluated:Patient Re-evaluated prior to inductionOxygen Delivery Method: Circle System Utilized Preoxygenation: Pre-oxygenation with 100% oxygen Intubation Type: IV induction Ventilation: Mask ventilation without difficulty Grade View: Grade I Tube type: Oral Number of attempts: 1 Airway Equipment and Method: video-laryngoscopy Placement Confirmation: positive ETCO2,  ETT inserted through vocal cords under direct vision and breath sounds checked- equal and bilateral Secured at: 22 cm Dental Injury: Teeth and Oropharynx as per pre-operative assessment

## 2011-10-14 NOTE — H&P (Signed)
  Patient is here today for a right total hip replacement.  He has severe medical problems and understands that this elective surgery can cause death.  He excepts this risk due to his severe pain and gives consent to proceed.  He knows that, if surgery is successful, we will put him in the ICU following surgery.  There has been no change in his H&P other than worsening peripheral edema.  See other notes in dictated H&P.

## 2011-10-14 NOTE — Brief Op Note (Signed)
10/14/2011  1:16 PM  PATIENT:  [name]  60 y.o. male  PRE-OPERATIVE DIAGNOSIS:  Right Hip Severe Osteoarthritis  POST-OPERATIVE DIAGNOSIS:  Right Hip Severe Osteoarthritis  PROCEDURE:  Procedure(s): TOTAL HIP ARTHROPLASTY ANTERIOR APPROACH  SURGEON:  Surgeon(s): Kathryne Hitch  PHYSICIAN ASSISTANT:   ASSISTANTS: none   ANESTHESIA:   general  EBL:  Total I/O In: 1350 [I.V.:1000; Blood:350] Out: 1575 [Urine:575; Blood:1000]  BLOOD ADMINISTERED:500 CC PRBC  DRAINS: none   LOCAL MEDICATIONS USED:  NONE  SPECIMEN:  No Specimen  DISPOSITION OF SPECIMEN:  N/A  COUNTS:  YES  TOURNIQUET:  * No tourniquets in log *  DICTATION: .Other Dictation: Dictation Number [id number]  PLAN OF CARE: Admit to inpatient   PATIENT DISPOSITION:  PACU - guarded condition.   Delay start of Pharmacological VTE agent (>24hrs) due to surgical blood loss or risk of bleeding:  {YES/NO/NOT APPLICABLE:20182

## 2011-10-14 NOTE — Op Note (Signed)
NAMECEDERICK, BROADNAX NO.:  [id number]  MEDICAL RECORD NO.:  [id number]  LOCATION:  [facility]                         FACILITY:  [facility]  PHYSICIAN:  Vanita Panda. Magnus Ivan, M.D.DATE OF BIRTH:  Apr 24, 1951  DATE OF PROCEDURE:  10/14/2011 DATE OF DISCHARGE:                              OPERATIVE REPORT   PREOPERATIVE DIAGNOSES: 1. Severe end-stage arthritis. 2. Degenerative joint disease, right hip.  POSTOPERATIVE DIAGNOSES: 1. Severe end-stage arthritis. 2. Degenerative joint disease, right hip.  PROCEDURE:  Right total hip arthroplasty through direct anterior approach.  IMPLANTS:  DePuy P[place] Sector acetabular component size 52, size 12 Corail femoral stem with standard offset, size 36+ 1.5 metal hip ball. Also, neutral +4, 36 polyethylene liner.  SURGEON:  Vanita Panda. Magnus Ivan, M.D.  ANESTHESIA:  General.  ANTIBIOTICS:  2 g IV Ancef.  BLOOD LOSS:  1000 mL.  FLUIDS:  2 units packed red blood cells.  COMPLICATIONS:  None.  DISPOSITION:  To PACU in stable, but guarded condition.  INDICATIONS:  [name] is a 60 year old gentleman with multiple medical problems including severe heart disease and diabetes.  He is in such a fragile state that his quality of life is so poor.  He wishes to proceed with a right total hip arthroplasty due to debilitating arthritis.  We have talked with him in length about this, that the main risk of the surgery will be death for someone in such a fragile state.  He has been seen by Dr. Tonny Bollman, his cardiologist, who has tried his best to optimize Mr. Silliman health with even providing stents.  He is followed about from this and has stopped his Plavix, but there is not much else that we can do other than not do surgery, which he is definitely opposed to due to severe pain.  Again, he understands and his wife understands in detail the risk of this including acute blood loss, anemia, and death.  He also understands that  we will be sending him to the intensive care unit following surgery for a slow recovery.  PROCEDURE DESCRIPTION:  After informed consent was obtained, the appropriate right foot was marked.  He was brought into the operating room, and General anesthesia was obtained.  A Foley catheter was then placed and then traction boots were placed on both his feet.  He was placed on the Hana fracture table.  A perineal post was placed as well and both legs were placed in in-line skeletal traction with no traction applied.  His right hip was then prepped and draped with DuraPrep and sterile drapes.  A time-out was called to identify the correct patient, correct right hip.  We then made an incision 1 cm distal and 3 cm posterior to the anterior superior iliac spine.  The dissection was difficult given his morbid obesity and we did preoperatively tape his belly out of the way before making our incision.  I dissected down to the tensor fascia lata.  This was divided longitudinally.  I then proceeded with a direct anterior approach to the hip with retractors placed over the lateral neck and medial neck.  I cauterized the vessels that were bleeding, the  lateral femoral circumflex vessels did ooze quite a bit.  I made a femoral neck cut proximal to the lesser trochanter and then used a Corkscrew guide and removed the femoral head in its entirety.  I then cleaned the acetabulum of debris.  I placed a Bent Hohmann anteromedially and a Cobra retractor laterally.  I then began reaming from size 45 up to 51 with the last reamers placed under direct fluoroscopic guidance.  I then placed a real size 52 acetabular component with Gription .  We knocked this into place under direct fluoroscopy and visualization and I was pleased with my anteversion as well as the inclination.  We then placed a hole eliminator guide followed by the real neutral +4, 36 polyethylene liner.  Next, attention was turned to the femur.  We  placed a temporary hook under the vastus lateralis as resting position.  We externally rotated the hip to 90 degrees, extended and abducted the hip.  I released the lateral capsule and as much as piriformis as I could.  We used the box cutting guide and then began broaching from a size 8 broach up to a size 12.  I did experience some lateral break in the calcar, but I did not feel this was significant and did not propagate.  I then placed a real size 12 femoral component and a real 36 +1.5 metal head after trialing.  We reduced this in the acetabulum and it was stable with rotation and minimal shuck.  It did not appear to be loose at all.  We then copiously irrigated the tissues and closed the joint capsule with #1 Ethibond suture followed by running #1 Vicryl  in the tensor fascia lata.  The subcutaneous tissue was closed with interrupted 2-0 Vicryl followed by interrupted staples on the skin.  The patient was then awakened, extubated, taken off the Hana table into the recovery room in stable condition.  We still consider him somewhat guarded since we had sent him to the ICU.     Vanita Panda. Magnus Ivan, M.D.     CYB/MEDQ  D:  10/14/2011  T:  10/14/2011  Job:  [id number]  cc:   Rachael Fee, MD [address] [place], [place] [address]  Veverly Fells. Excell Seltzer, MD [address] [place], [place] [address]  [facility]

## 2011-10-14 NOTE — Consult Note (Signed)
[facility] Consult Note  [name] ZOX:[id number]:[id number]   Patient's outpatient PCP is REED,TIFFANY L., DO Consult requested in the Hospital by Kathryne Hitch, On 10/14/2011  Reason for consult:  Evaluation and Management recommendations for medical comorbidities in the immediate postop setting.    Past Medical History  Diagnosis Date  . Hypertension   . Type II or unspecified type diabetes mellitus without mention of complication, not stated as uncontrolled   . Thyroid disease   . Cirrhosis, non-alcoholic   . Morbid obesity   . Pancytopenia   . Fatty liver   . Coronary artery disease   . Myocardial infarction     "HEART INCIDENT " OCT 2012  . Angina     RARE USE OF NTG  . CHF (congestive heart failure)     JUNE 2012  . Arthritis   . Gout 35 YRS AGO  . Cellulitis of left leg   . Cirrhosis of liver     NONALCOHOLIC  . Nocturia   . Elevated BUN   . Elevated serum creatinine   . Serum ammonia increased   . Splenomegaly   . Platelets decreased   . Thyroid disorder     PT DOES NOT KNOW IF LOW OR HIGH  . Obstructive sleep apnea     USES O2 WITH C - PAP SETTING FOR C-PA IS 16     Past Surgical History  Procedure Date  . Appendectomy   . Coronary stent placement     2 CARDIAC STENTS OCT 08/2011  . Total knee arthroplasty 10/14/2011    Past Surgical History  Procedure Date  . Appendectomy   . Coronary stent placement     2 CARDIAC STENTS OCT 08/2011  . Total knee arthroplasty - Ant. Approach - Right 10/14/2011    HPI:-  [name] [id number]:[id number] is a 60 y.o. male, with a PMH as listed above most notably significant for HTN, Type 2 DM and CAD s/p stenting (with 2 active stents in place) who was admitted for scheduled right hip replacement (ant. Approach) on 10/14/2011.  The patient had been suffering from severe end-stage degenerative joint pain involving the right hip.  He had been cleared by his cardiologist Dr. Excell Seltzer for  surgery and the patient elected to proceed with surgery despite the significant risks involved.  The patient tolerated the surgery very well.   He is now post op in the ICU for close monitoring given his co-morbidities but he has been awake, alert and his pain is controlled.  He has a history of CHF and NAFLD with cirrhosis.  A medical consultation was requested for further evaluation and monitoring of the patient while he is in his immediate postop state.    Review of Systems   A comprehensive review of systems was negative except for: Respiratory: positive for cough Cardiovascular: positive for lower extremity edema Musculoskeletal: positive for pain from incision area Endocrine: positive for hyperglycemia   Social History History  Substance Use Topics  . Smoking status: Never Smoker   . Smokeless tobacco: Never Used  . Alcohol Use: No     Family History Family History  Problem Relation Age of Onset  . Colon cancer Neg Hx   . Diabetes Mother   . Diabetes Father      Prior to Admission medications   Medication Sig Start Date End Date Taking? Authorizing Provider  aspirin 81 MG tablet Take 81 mg by mouth every morning.    Yes Historical Provider,  MD  furosemide (LASIX) 80 MG tablet Take 80 mg by mouth 2 (two) times daily.  06/02/11  Yes Historical Provider, MD  insulin detemir (LEVEMIR) 100 UNIT/ML injection Inject 25 Units into the skin at bedtime.    Yes Historical Provider, MD  lactulose (CHRONULAC) 10 GM/15ML solution Take 20 g by mouth 3 (three) times daily as needed. CONSTIPATION   Yes Historical Provider, MD  levothyroxine (SYNTHROID, LEVOTHROID) 50 MCG tablet Take 50 mcg by mouth every morning.    Yes Historical Provider, MD  lidocaine (LIDODERM) 5 % Place 1 patch onto the skin daily as needed. Remove & Discard patch within 12 hours or as directed by MD..PAIN   Yes Historical Provider, MD  metoprolol succinate (TOPROL-XL) 25 MG 24 hr tablet 12.5 mg every morning.    Yes  Historical Provider, MD  Multiple Vitamin (MULTIVITAMIN) tablet Take 1 tablet by mouth daily.     Yes Historical Provider, MD  nitroGLYCERIN (NITROSTAT) 0.4 MG SL tablet Place 0.4 mg under the tongue every 5 (five) minutes as needed. CHEST PAIN   Yes Historical Provider, MD  nystatin (MYCOSTATIN) powder Apply topically daily as needed. ITCH    Yes Historical Provider, MD  oxycodone (OXY-IR) 5 MG capsule Take 5 mg by mouth every 4 (four) hours as needed. PAIN    Yes Historical Provider, MD  spironolactone (ALDACTONE) 50 MG tablet Take 2 tablets (100 mg total) by mouth 2 (two) times daily. 10/12/11  Yes Rob Bunting, MD    Allergies  Allergen Reactions  . Statins Other (See Comments)    PT DOES NOT REMEMBER    CURRENT MEDS   aspirin 81 mg Daily  ceFAZolin (ANCEF) IV 2 g On Call to OR  ceFAZolin (ANCEF) IV 2 g Q6H  docusate sodium 100 mg BID  ferrous sulfate 325 mg TID PC  furosemide 80 mg BID WC  insulin detemir 25 Units QHS  levothyroxine 50 mcg QAM  metoprolol succinate 12.5 mg Daily  morphine  Q4H  multivitamin 1 tablet Daily  rivaroxaban 10 mg Q24H  spironolactone 100 mg BID  DISCONTD: chlorhexidine 60 mL Once  DISCONTD: chlorhexidine 60 mL Once  DISCONTD: metoprolol succinate 12.5 mg Daily  DISCONTD: rivaroxaban 10 mg Q24H      Physical Exam  Intake/Output Summary (Last 24 hours) at 10/14/11 1619 Last data filed at 10/14/11 1430  Gross per 24 hour  Intake   1600 ml  Output   1800 ml  Net   -200 ml   Blood pressure 115/63, pulse 88, temperature 97.1 F (36.2 C), temperature source Tympanic, resp. rate 14, SpO2 100.00%.  General appearance: alert, cooperative, appears stated age, no distress and pale Head: Normocephalic, without obvious abnormality, atraumatic Eyes: negative, conjunctivae/corneas clear. PERRL, EOM's intact. Fundi benign. Nose: Nares normal. Septum midline. Mucosa normal. No drainage or sinus tenderness., no discharge Throat: lips, mucosa, and  tongue normal; teeth and gums normal and dry tongue Neck: no adenopathy, no carotid bruit, no JVD, supple, symmetrical, trachea midline and thyroid not enlarged, symmetric, no tenderness/mass/nodules Lungs: BBS with rare ant crackles heard, no wheezes heard, Full BS Chest wall: no tenderness Heart: regular rate and rhythm, S1, S2 normal, no murmur, click, rub or gallop Abdomen: obese, soft, hypoactive BS, nd, no masses palpated, no HSM Extremities: edema 2+ edema BLEs pitting Skin: pale and clammy Neurologic: Alert and oriented X 3, normal strength and tone. Normal symmetric reflexes. Normal coordination Data Review CBC w Diff: Lab Results  Component Value Date  WBC 4.9 10/07/2011   HGB 9.2* 10/14/2011   HCT 27.0* 10/14/2011   PLT 99* 10/07/2011   LYMPHOPCT 16.7 09/26/2011   MONOPCT 9.8 09/26/2011   EOSPCT 1.7 09/26/2011   BASOPCT 0.3 09/26/2011    CMP: Lab Results  Component Value Date   NA 136 10/14/2011   K 3.7 10/14/2011   CL 92* 10/07/2011   CO2 33* 10/07/2011   BUN 38* 10/07/2011   CREATININE 1.37* 10/07/2011   PROT 7.4 10/07/2011   ALBUMIN 3.4* 10/07/2011   BILITOT 1.8* 10/07/2011   ALKPHOS 80 10/07/2011   AST 36 10/07/2011   ALT 25 10/07/2011    Coagulation:  Lab Results  Component Value Date   INR 1.49 10/14/2011    Cardiac markers: Lab Results  Component Value Date   CKMB 24.1* 08/29/2011   TROPONINI 6.85* 08/29/2011   My personal review of EKG: Rhythm NSR, no Acute ST changes   Impression /Comments and Recommendations  1. Post Op Day #0 s/p right hip replacement - post op management per orthopedics 2. Hypertension 3. CAD s/p 2 stents 4. CHF with 2+ edema bilateral lower extremities 5. Type 2 Diabetes Mellitus 6. NAFLD with cirrhosis 7. OSA 8. Hypothyroidism 9. History of Gout 10. CRI 11. Anemia - multifactorial   The hospitalist service will continue to follow the patient closely for medical management of his complex medical problems.  We are going to  monitor his blood glucose closely and monitor Ins and Outs closely for assessment of his fluid balance.   Will change his lasix to IV and diurese pt. Check a portable CXR in AM. Check A1c.  Resume home meds for hypothyroidism, and hypertension.  Will monitor serial CBC tests.  Pt should be allowed to use a CPAP tonight as set up by Resp Therapy.  Will repeat electrolytes in the morning.  Please see orders.  We are monitoring blood pressure closely.     Thank you for the consult, we will follow the patient with you in the Hospital.   Cleora Fleet, MD, CDE, FAAFP [facility] [place], [place]

## 2011-10-15 ENCOUNTER — Inpatient Hospital Stay (HOSPITAL_COMMUNITY): Payer: 59

## 2011-10-15 LAB — CBC
Hemoglobin: 10.2 g/dL — ABNORMAL LOW (ref 13.0–17.0)
MCV: 89.4 fL (ref 78.0–100.0)
Platelets: 155 10*3/uL (ref 150–400)
RBC: 3.19 MIL/uL — ABNORMAL LOW (ref 4.22–5.81)
RBC: 2.93 MIL/uL — ABNORMAL LOW (ref 4.22–5.81)
WBC: 6.7 10*3/uL (ref 4.0–10.5)

## 2011-10-15 LAB — GLUCOSE, CAPILLARY
Glucose-Capillary: 210 mg/dL — ABNORMAL HIGH (ref 70–99)
Glucose-Capillary: 299 mg/dL — ABNORMAL HIGH (ref 70–99)

## 2011-10-15 LAB — COMPREHENSIVE METABOLIC PANEL
ALT: 16 U/L (ref 0–53)
Albumin: 2.2 g/dL — ABNORMAL LOW (ref 3.5–5.2)
Alkaline Phosphatase: 58 U/L (ref 39–117)
BUN: 40 mg/dL — ABNORMAL HIGH (ref 6–23)
CO2: 28 mEq/L (ref 19–32)
Chloride: 92 mEq/L — ABNORMAL LOW (ref 96–112)
Creatinine, Ser: 1.27 mg/dL (ref 0.50–1.35)
GFR calc Af Amer: 74 mL/min — ABNORMAL LOW (ref >90)
GFR calc Af Amer: 69 mL/min — ABNORMAL LOW (ref >90)
GFR calc non Af Amer: 64 mL/min — ABNORMAL LOW (ref >90)
GFR calc non Af Amer: 59 mL/min — ABNORMAL LOW (ref >90)
Glucose, Bld: 245 mg/dL — ABNORMAL HIGH (ref 70–99)
Potassium: 4.7 mEq/L (ref 3.5–5.1)
Sodium: 131 mEq/L — ABNORMAL LOW (ref 135–145)
Total Bilirubin: 1.7 mg/dL — ABNORMAL HIGH (ref 0.3–1.2)

## 2011-10-15 LAB — PROTIME-INR
INR: 1.76 — ABNORMAL HIGH (ref 0.00–1.49)
Prothrombin Time: 20.8 seconds — ABNORMAL HIGH (ref 11.6–15.2)

## 2011-10-15 MED ORDER — CEFAZOLIN SODIUM-DEXTROSE 2-3 GM-% IV SOLR: 2.0000 g | Freq: Once | INTRAVENOUS | Status: DC

## 2011-10-15 MED ORDER — FUROSEMIDE 80 MG PO TABS
80.0000 mg | ORAL_TABLET | Freq: Two times a day (BID) | ORAL | Status: DC
  Administered 2011-10-15 – 2011-10-16 (×2): 80 mg via ORAL
  Filled 2011-10-15 (×5): qty 1

## 2011-10-15 MED ORDER — CEFAZOLIN SODIUM-DEXTROSE 2-3 GM-% IV SOLR
2.0000 g | Freq: Once | INTRAVENOUS | Status: AC
  Administered 2011-10-15: 2 g via INTRAVENOUS
  Filled 2011-10-15: qty 50

## 2011-10-15 MED ORDER — INSULIN ASPART 100 UNIT/ML ~~LOC~~ SOLN
0.0000 [IU] | Freq: Three times a day (TID) | SUBCUTANEOUS | Status: DC
  Administered 2011-10-15: 5 [IU] via SUBCUTANEOUS
  Administered 2011-10-15: 7 [IU] via SUBCUTANEOUS
  Administered 2011-10-16 – 2011-10-17 (×6): 3 [IU] via SUBCUTANEOUS
  Administered 2011-10-18: 2 [IU] via SUBCUTANEOUS
  Administered 2011-10-18 (×2): 3 [IU] via SUBCUTANEOUS
  Administered 2011-10-19 (×2): 7 [IU] via SUBCUTANEOUS
  Administered 2011-10-19 – 2011-10-20 (×2): 3 [IU] via SUBCUTANEOUS
  Administered 2011-10-20: 7 [IU] via SUBCUTANEOUS
  Administered 2011-10-20: 2 [IU] via SUBCUTANEOUS
  Administered 2011-10-21: 5 [IU] via SUBCUTANEOUS
  Administered 2011-10-21: 3 [IU] via SUBCUTANEOUS
  Filled 2011-10-15: qty 3

## 2011-10-15 NOTE — Progress Notes (Addendum)
Physical Therapy Evaluation Patient Details Name: [name] MRN: [id number] DOB: 1951/10/02 Today's Date: 10/15/2011 Time: 4098-1191 Charge: Vanita Ingles, TA  Problem List:  Patient Active Problem List  Diagnoses  . AODM  . OBSTRUCTIVE SLEEP APNEA  . Essential hypertension, benign  . Cirrhosis  . Coronary atherosclerosis of native coronary artery  . Hyperlipidemia  . Morbid obesity  . Arthritis pain of hip  . CRI (chronic renal insufficiency)  . NAFLD (nonalcoholic fatty liver disease)  . Anemia    Past Medical History:  Past Medical History  Diagnosis Date  . Hypertension   . Type II or unspecified type diabetes mellitus without mention of complication, not stated as uncontrolled   . Thyroid disease   . Cirrhosis, non-alcoholic   . Morbid obesity   . Pancytopenia   . Fatty liver   . Coronary artery disease   . Myocardial infarction     "HEART INCIDENT " OCT 2012  . Angina     RARE USE OF NTG  . CHF (congestive heart failure)     JUNE 2012  . Arthritis   . Gout 35 YRS AGO  . Cellulitis of left leg   . Cirrhosis of liver     NONALCOHOLIC  . Nocturia   . Elevated BUN   . Elevated serum creatinine   . Serum ammonia increased   . Splenomegaly   . Platelets decreased   . Thyroid disorder     PT DOES NOT KNOW IF LOW OR HIGH  . Obstructive sleep apnea     USES O2 WITH C - PAP SETTING FOR C-PA IS 16   Past Surgical History:  Past Surgical History  Procedure Date  . Appendectomy   . Coronary stent placement     2 CARDIAC STENTS OCT 08/2011  . Total knee arthroplasty 10/14/2011    PT Assessment/Plan/Recommendation PT Assessment Clinical Impression Statement: Pt s/p R THR with anterior approach.  Pt would benefit from acute PT services in order to improve LE strength and ROM in order to increase independence with transfers and ambulation to prepare for D/C to SNF. PT Recommendation/Assessment: Patient will need skilled PT in the acute care venue PT Problem List:  Decreased strength;Decreased range of motion;Decreased mobility;Obesity;Decreased activity tolerance;Decreased safety awareness;Decreased knowledge of use of DME;Pain PT Therapy Diagnosis : Difficulty walking;Acute pain PT Plan PT Frequency: 7X/week PT Treatment/Interventions: DME instruction;Gait training;Functional mobility training;Therapeutic exercise;Patient/family education PT Recommendation Follow Up Recommendations: Skilled nursing facility Equipment Recommended: Defer to next venue PT Goals  Acute Rehab PT Goals PT Goal Formulation: With patient Time For Goal Achievement: 7 days Pt will go Supine/Side to Sit: with supervision PT Goal: Supine/Side to Sit - Progress: Progressing toward goal Pt will Transfer Sit to Stand/Stand to Sit: with supervision PT Transfer Goal: Sit to Stand/Stand to Sit - Progress: Progressing toward goal Pt will Ambulate: 51 - 150 feet;with supervision;with rolling walker PT Goal: Ambulate - Progress: Other (comment) Pt will Perform Home Exercise Program: with supervision, verbal cues required/provided PT Goal: Perform Home Exercise Program - Progress: Other (comment)  PT Evaluation Precautions/Restrictions  Precautions Precaution Comments: No hip precautions, arterial line Restrictions RLE Weight Bearing: Weight bearing as tolerated Prior Functioning  Home Living Lives With: Spouse Additional Comments: Pt plans for D/C to SNF. Prior Function Level of Independence: Requires assistive device for independence Comments: Pt depends on rollator for ambulation and ADLs. Cognition Cognition Arousal/Alertness: Lethargic Overall Cognitive Status: Appears within functional limits for tasks assessed Orientation Level: Oriented X4 Cognition -  Other Comments: Pt seems lethargic and keeps eyes closes with verbal cues to keep open with mobility.  Possibly due to pain meds/PCA pump Sensation/Coordination   Extremity Assessment RLE Assessment RLE Assessment:  Exceptions to [facility] Main RLE Strength RLE Overall Strength Comments: grossly 2+/5 throughout with functional observation LLE Assessment LLE Assessment: Exceptions to [facility] LLE Strength LLE Overall Strength Comments: grossly at least 3/5 throughout with functional observation Mobility (including Balance) Bed Mobility Bed Mobility: Yes Supine to Sit: 1: +2 Total assist;Patient percentage (comment);With rails;HOB elevated (Comment degrees) Supine to Sit Details (indicate cue type and reason): HOB elevated 55*, assist required for bilateral LEs and trunk, pt=40% Sitting - Scoot to Edge of Bed: 2: Max assist Sitting - Scoot to [place] of Bed Details (indicate cue type and reason): max assist to bring hips to EOB with bed pad Transfers Transfers: Yes Sit to Stand: 1: +2 Total assist;From bed;From elevated surface Sit to Stand Details (indicate cue type and reason): bed elevated, pt=85%, pt requested and adamant about using rollator today, keeps hands on rollator with sit-stand Stand to Sit: To chair/3-in-1;1: +2 Total assist;With armrests Stand to Sit Details: pt=85%, assist for controlling descent, verbal cues to reach back for armrests Stand Pivot Transfers: 1: +2 Total assist Stand Pivot Transfer Details (indicate cue type and reason): pt=90%, increased time required, used rollator, pt has trouble lifting bilateral LEs and instead would slide feet to perform transfers, increased verbal cues for technique, discussed importance of using RW instead of rollator for safety and ability to use more UE WBing with RW  Balance Balance Assessed: Yes Static Standing Balance Static Standing - Balance Support: Bilateral upper extremity supported Static Standing - Level of Assistance: 4: Min assist Static Standing - Comment/# of Minutes: min/guard standing for 3 minutes prior to transferring to chair and testing weight on R LE (+2 for safety) Exercise    End of Session PT - End of Session Equipment Utilized During  Treatment: Gait belt Activity Tolerance: Patient tolerated treatment well Patient left: in chair;with call bell in reach Nurse Communication: Mobility status for transfers  [facility] E 10/15/2011, 2:07 PM Pager: [phone]

## 2011-10-15 NOTE — Progress Notes (Signed)
Physical Therapy Treatment Patient Details Name: [name] MRN: [id number] DOB: 10-30-1951 Today's Date: 10/15/2011 Time: 4098-1191 Charge: TE PT Assessment/Plan  PT - Assessment/Plan Comments on Treatment Session: Pt performed exercises in recliner.  Spouse came end of session and aware of plan of care and D/C plans. PT Plan: Discharge plan remains appropriate;Frequency remains appropriate PT Frequency: 7X/week Follow Up Recommendations: Skilled nursing facility Equipment Recommended: Defer to next venue PT Goals  Acute Rehab PT Goals PT Goal Formulation: With patient Time For Goal Achievement: 7 days Pt will go Supine/Side to Sit: with supervision PT Goal: Supine/Side to Sit - Progress: Progressing toward goal Pt will Transfer Sit to Stand/Stand to Sit: with supervision PT Transfer Goal: Sit to Stand/Stand to Sit - Progress: Progressing toward goal Pt will Ambulate: 51 - 150 feet;with supervision;with rolling walker PT Goal: Ambulate - Progress: Other (comment) Pt will Perform Home Exercise Program: with supervision, verbal cues required/provided PT Goal: Perform Home Exercise Program - Progress: Progressing toward goal  PT Treatment Precautions/Restrictions  Precautions Precaution Comments: no hip precautions (arterial line removed prior to 2nd visit) Restrictions Weight Bearing Restrictions: Yes RLE Weight Bearing: Weight bearing as tolerated  Exercise  Total Joint Exercises Ankle Circles/Pumps: AROM;Both;20 reps;Seated Quad Sets: AROM;Strengthening;Both;20 reps;Seated (with legs reclined) Gluteal Sets: AROM;Strengthening;Both;Seated (20 reps) Short Arc Quad: AROM;Strengthening;Right;Seated (20 reps, with legs reclined) Heel Slides: AAROM;Strengthening;20 reps;Seated;Right (with legs reclined) Hip ABduction/ADduction: AAROM;Strengthening;Right;20 reps;Seated (with legs reclined) End of Session PT - End of Session Equipment Utilized During Treatment: Gait  belt Activity Tolerance: Patient tolerated treatment well Patient left: in chair;with call bell in reach;with family/visitor present Nurse Communication: Mobility status for transfers General Behavior During Session: [facility] for tasks performed Cognition: [facility] for tasks performed  Dulse Rutan,KATHrine E 10/15/2011, 4:29 PM Pager: [phone]

## 2011-10-15 NOTE — Progress Notes (Signed)
Interval history- 60 y.o. male, with a PMH as listed above most notably significant for HTN, Type 2 DM and CAD s/p stenting (with 2 active stents in place October 2012, was on Plavix for short period of time and then changed to aspirin monotherapy one week prior to surgery per Dr. Excell Seltzer) who was admitted for scheduled right hip replacement (ant. Approach) on 10/14/2011 . The patient had been suffering from severe end-stage degenerative joint pain involving the right hip. He had been cleared by his cardiologist Dr. Excell Seltzer for surgery and the patient elected to proceed with surgery despite the significant risks involved. The patient tolerated the surgery very well. He is now post op in the ICU for close monitoring given his co-morbidities but he has been awake, alert and his pain is controlled.  He has a history of CHF-was admitted in July 2012 for decompensated CHF and Has NAFLD with cirrhosis of the liver followed by Dr. Rob Bunting of the lumbar gastroenterology. Dr. Christella Hartigan in fact saw the patient 2 days ago and recommended that the patient be diuresed aggressively prior to surgery    [facility] STEP-DOWN PROGRESS NOTE  S/p 2 units PRBC's yesterday-has had a lot of somatic complaints overnight from nursing report.  Wanted his foley catheter left in, has also had some issues with pain and has been using his PCA liberally. States that he has not had a bowel movement and states that he usually takes OxyContin at home. States that he is also very happy with how he is being treated at present time. States Dr. Lajoyce Corners to as well as Dr. Magnus Ivan did come to see him this morning.  Denies specific chest pain shortness of breath nausea vomiting no other issues at present time   Patient Details:    [name] is an 60 y.o. male.  Lines, Airways, Drains: Arterial Line 10/14/11 Left Radial (Active)  Site Assessment Clean;Dry;Intact 10/14/2011  8:00 PM  Line Status Pulsatile blood flow 10/14/2011   8:00 PM  Art Line Waveform Appropriate 10/14/2011  8:00 PM  Art Line Interventions Zeroed and calibrated;Connections checked and tightened;Flushed per protocol 10/14/2011  8:00 PM  Color/Movement/Sensation Capillary refill less than 3 sec 10/14/2011  8:00 PM  Dressing Type Transparent;Occlusive 10/14/2011  8:00 PM  Dressing Status Clean;Dry;Intact 10/14/2011  8:00 PM     Urethral Catheter Latex 14 Fr. (Active)  Site Assessment Clean;Intact 10/14/2011  8:00 PM  Collection Container Standard drainage bag 10/14/2011  8:00 PM  Securement Method Securing device (Describe) 10/14/2011  8:00 PM  Urinary Catheter Interventions Unclamped 10/14/2011  4:00 PM  Indication for Insertion or Continuance of Catheter Urinary output monitoring 10/14/2011  8:00 PM  Output (mL) 200 mL 10/15/2011  6:00 AM    Anti-infectives:  Anti-infectives     Start     Dose/Rate Route Frequency Ordered Stop   10/16/11 1100   ceFAZolin (ANCEF) IVPB 2 g/50 mL premix  Status:  Discontinued        2 g 100 mL/hr over 30 Minutes Intravenous  Once 10/15/11 0044 10/15/11 0422   10/15/11 1100   ceFAZolin (ANCEF) IVPB 2 g/50 mL premix        2 g 100 mL/hr over 30 Minutes Intravenous  Once 10/15/11 0424     10/15/11 0430   ceFAZolin (ANCEF) IVPB 2 g/50 mL premix  Status:  Discontinued        2 g 100 mL/hr over 30 Minutes Intravenous  Once 10/15/11 0422 10/15/11 0424  10/14/11 1700   ceFAZolin (ANCEF) IVPB 2 g/50 mL premix        2 g 100 mL/hr over 30 Minutes Intravenous Every 6 hours 10/14/11 1544 10/15/11 0439   10/14/11 0600   ceFAZolin (ANCEF) IVPB 2 g/50 mL premix        2 g 100 mL/hr over 30 Minutes Intravenous On call to O.R. 10/13/11 1933 10/14/11 1050          Microbiology: Results for orders placed in visit on 10/07/11  SURGICAL PCR SCREEN     Status: Normal   Collection Time   10/07/11  3:20 PM      Component Value Range Status Comment   MRSA, PCR NEGATIVE  NEGATIVE  Final    Staphylococcus aureus  NEGATIVE  NEGATIVE  Final     ABG    Component Value Date/Time   PHART 7.539* 10/14/2011 1216   PCO2ART 35.3 10/14/2011 1216   PO2ART 319.0* 10/14/2011 1216   HCO3 30.2* 10/14/2011 1216   TCO2 31 10/14/2011 1216   O2SAT 100.0 10/14/2011 1216     Best Practice/Protocols:  VTE Prophylaxis: Direct Thrombin Inhibitor Xigris  Events:   Studies: Dg Chest 2 View  10/07/2011  *RADIOLOGY REPORT*  Clinical Data: Preoperative evaluation.  CHEST - 2 VIEW  Comparison: 08/28/2011.  Findings: There is moderate enlargement cardiac silhouette.  Lungs are free of infiltrates.  No pleural effusions are seen.  Bones appear average for age.  There is minimal degenerative spondylosis.  IMPRESSION: No acute or active cardiopulmonary or pleural abnormalities are seen.  Original Report Authenticated By: Crawford Givens, M.D.   Dg Hip Complete Right  10/14/2011  *RADIOLOGY REPORT*  Clinical Data: Severe osteoarthritis of the right hip.  RIGHT HIP - COMPLETE 2+ VIEW  Comparison: Radiographs dated 11/28/2010  Findings: AP C-arm images demonstrate the patient has undergone a right total hip prosthesis insertion.  The prosthesis appears in excellent position in the AP projection.  No fractures.  IMPRESSION: Satisfactory appearance of the right hip after right total hip prosthesis insertion.  Original Report Authenticated By: Gwynn Burly, M.D.   Dg Pelvis Portable  10/14/2011  *RADIOLOGY REPORT*  Clinical Data: Severe osteoarthritis of the right hip.  Right total hip replacement.  PORTABLE PELVIS  Comparison: CT scan dated 05/28/2011  Findings: Right hip prosthesis appears in good position in the AP projection.  No fracture.  Normal appearing left hip.  IMPRESSION: Satisfactory appearance of the right hip after total hip replacement.  Original Report Authenticated By: Gwynn Burly, M.D.   Dg Chest Port 1 View  10/15/2011  *RADIOLOGY REPORT*  Clinical Data: Congestive heart failure.  Coronary artery disease.  Postoperative for hip replacement.  PORTABLE CHEST - 1 VIEW  Comparison: 10/07/2011  Findings: Low lung volumes are present, causing crowding of the pulmonary vasculature.  Cardiac and mediastinal contours appear unremarkable.  The lungs appear clear.  No pleural effusion is observed.  IMPRESSION:  1.  No specific radiographic abnormality is observed.  Original Report Authenticated By: Dellia Cloud, M.D.   Dg Hip Portable 1 View Right  10/14/2011  *RADIOLOGY REPORT*  Clinical Data: Osteoarthritis of the right hip.  PORTABLE RIGHT HIP - 1 VIEW  Comparison: Radiographs dated 10/14/2011  Findings: Lateral portable view of the right hip demonstrates the acetabular and femoral components of the total hip prosthesis to be in excellent position.  IMPRESSION: Satisfactory appearance in the lateral view of the right hip after total hip replacement.  Original Report  Authenticated By: Gwynn Burly, M.D.   Dg Fluoro Guide Ndl Plc/bx  09/16/2011  *RADIOLOGY REPORT*  Clinical Data: Severe right hip osteoarthritis and right hip pain. Recent cardiac catheterization.  The patient is unable to tolerate discontinuation of Plavix for the procedure.  HIP INJECTION UNDER FLUOROSCOPY  Procedure: After a thorough discussion of risks and benefits of the procedure including bleeding, infection, injuries to adjacent structures and extra-articular injection, written and oral informed consent was obtained.  Verbal consent was obtained by Dr. Carlota Raspberry. Time out form completed (when appropriate). We discussed the increased risk of bleeding and hemarthrosis associated with therapeutic levels of Plavix at the time of injection.  The patient and his wife accepted the risk and wanted to proceed.  We have all agreed to risk is low and the probability of a therapeutic outcome was high.  The patient was placed supine on the fluoroscopy table. Preliminary localization of the right hip was performed.  The skin was prepped and draped in  the usual sterile fashion.  Local anesthesia was provided with 1% Lidocaine without Epinephrine. Under fluoroscopic guidance, a 22 gauge 3 1/2 inch spinal needle was advanced into the hip joint.  Subsequently injection of 3 mL omnipaque 180 contrast agent confirmed intra-articular placement. No vascular uptake. The image was captured showing intra-articular contrast and a solution of 120 mg Depo-Medrol and 1% Lidocaine without Epinephrine for a volume of 3 ml along with 2 ml of 0.50% Sensorcaine was injected into the hip.  The needles were removed and a sterile dressing applied.  The patient tolerated a procedure well and was discharged.  Fluoroscopy Time: 31 seconds  IMPRESSION:  Technically successful right hip injection of steroid and anesthetic.  Original Report Authenticated By: Andreas Newport, M.D.   Dg C-arm 61-120 Min-no Report  10/14/2011  CLINICAL DATA: severe osteoarthritis right hip   C-ARM 61-120 MINUTES  Fluoroscopy was utilized by the requesting physician.  No radiographic  interpretation.      Consults: Treatment Team:  Cleora Fleet, MD Wl2 Dimas Alexandria, MD   Subjective:    Overnight Issues: Doing well no further issues states that he is in moderate pain but has no other real complaints. Details me long and extensive medical history and his involvement with both cardiology and gastroenterology. No stool as yet. Has not ambulated for therapy yet  Objective:  Vital signs for last 24 hours: Temp:  [97.1 F (36.2 C)-98.9 F (37.2 C)] 98.6 F (37 C) (11/17 0400) Pulse Rate:  [80-101] 97  (11/17 0600) Resp:  [10-21] 21  (11/17 0600) BP: (94-130)/(30-76) 124/60 mmHg (11/16 2145) SpO2:  [93 %-100 %] 96 % (11/17 0600) Arterial Line BP: (130)/(62) 130/62 mmHg (11/16 1315) Weight:  [128.9 kg (284 lb 2.8 oz)-138 kg (304 lb 3.8 oz)] 284 lb 2.8 oz (128.9 kg) (11/17 0400)  Hemodynamic parameters for last 24 hours:    Intake/Output from previous day: 11/16 0701 -  11/17 0700 In: 3662.9 [P.O.:720; I.V.:2107.9; Blood:625; IV Piggyback:210] Out: 3625 [Urine:2625; Blood:1000]  Intake/Output this shift:    Vent settings for last 24 hours:    Physical Exam:  General: alert and no respiratory distress Neuro: alert, oriented and nonfocal exam Resp: clear to auscultation bilaterally and normal percussion bilaterally CVS: regular rate and rhythm, S1, S2 normal, no murmur, click, rub or gallop and SLIGHTLY TACHYCARDIC GI: soft, nontender, BS WNL, no r/g Extremities: no edema, no erythema, pulses WNL and edema 2+  Assessment/Plan:   NEURO  No acute  issues mentating well.   Plan:   PULM  Atelectasis/collapse (absent)   Plan: At risk for possible ARDS versus post operative issues with regards to transfusion. Will encourage BiPAP use as patient does use chronic  BiPAP at home  and has significant obstructive sleep apnea. Dr. Corky Sing is his home a neurologist-Will monitor his respiratory status closely one more day in the ICU. Would recommend also that the patient have a chest x-ray in the morning to determine volume status given the fact that he received 2 units of blood. I will arrange the same.   CARDIO  Sinus Tachycardia   Plan: think this is likely secondary to respiratory versus pain issues. He could also some decompensation from acute blood loss anemia and has been transfused for the same. We'll monitor hematocrit tomorrow and we will keep him on laboratory and step down unit today. Further disposition as per orthopedics   RENAL  Metabolic Alkalosis (moderate) Actue Renal Failure (due to nephrotoxic agents likleluy multi factorial given acute blood loss anemia and also possibly given Lasix.)   Plan: Will give IV Lasix to 40 mg by mouth twice a day-still has some element of grade 2 pitting edema but this is likely secondary to multiple causes inclusive of nutritional and may be secondary to his alcoholic liver cirrhosis as well. He will need his  spironolactone in addition to his Lasix to help offload him the ascites.   GI  Hepatic Dysfunction (chronic)   Plan: Patient has non-alcoholic fatty liver disease with a meld score of 16. He is followed by Dr. Rob Bunting of gastroenterology. Agree with possible use of Zaroxolyn his case however there is a propensity for him all toe anticoagulating versus bleeding. Would monitor him closely with daily INRs and would also get a possible direct thrombin inhibitor level if possible. We will have pharmacy closely monitor his liver status and we will get coagulation studies as well as complete metabolic panel every day while he is here. At present time he is doing well--would continue empiric beta blocker for the time being to prevent variceal bleeding   He is currently on Dilantin 100 by mouth twice a day and Lasix IV IV twice a day which will be converted to Lasix 40 twice a day. Given the fact that he has renal insufficiency with a BUN of 40   ID  Patient is to use his incentive spirometry religiously every 2 hours. Prevent atelectasis from   Plan:   HEME  Coagulopathy (liver failure)   Plan: See above discussion under GI   ENDO Diabetes Mellitus (Type 2)   Plan: Blood sugars have been elevated to the 120-2-10 range. He is not on sliding scale and is only getting currently that her insulin 25 units we'll place him on moderate sliding scale and allow him to eat her orders of orthopedics.   Global Issues   Patient looks very stable from hemodynamic standpoint although slightly tachycardic at present time. Patient will require one more day and step down unit likely from a medical standpoint and I will defer orthopedic decisions and pain management to Dr. Magnus Ivan and Dr. Lajoyce Corners    LOS: 1 day   Additional comments:I reviewed the patient's new clinical lab test results.  and I reviewed the patient's other test results.   Critical Care Total Time*: 45 Minutes  Ashly Yepez,JAI 10/15/2011  *Care during  the described time interval was provided by me and/or other providers on the critical care team.  I  have reviewed this patient's available data, including medical history, events of note, physical examination and test results as part of my evaluation.

## 2011-10-15 NOTE — Progress Notes (Signed)
Subjective: 1 Day Post-Op Procedure(s) (LRB): TOTAL HIP ARTHROPLASTY ANTERIOR APPROACH (Right) Patient is alert this am, wants to move.    Objective: Vital signs in last 24 hours: Temp:  [97.1 F (36.2 C)-98.9 F (37.2 C)] 98.6 F (37 C) (11/17 0400) Pulse Rate:  [80-101] 97  (11/17 0600) Resp:  [10-21] 21  (11/17 0600) BP: (94-130)/(30-76) 124/60 mmHg (11/16 2145) SpO2:  [93 %-100 %] 96 % (11/17 0600) Arterial Line BP: (130)/(62) 130/62 mmHg (11/16 1315) Weight:  [128.9 kg (284 lb 2.8 oz)-138 kg (304 lb 3.8 oz)] 284 lb 2.8 oz (128.9 kg) (11/17 0400)  Intake/Output from previous day: 11/16 0701 - 11/17 0700 In: 3662.9 [P.O.:720; I.V.:2107.9; Blood:625; IV Piggyback:210] Out: 3625 [Urine:2625; Blood:1000] Intake/Output this shift:     Basename 10/15/11 0400 10/14/11 1216  HGB 10.2* 9.2*    Basename 10/15/11 0400 10/14/11 1216  WBC 5.7 --  RBC 3.19* --  HCT 28.5* 27.0*  PLT 133* --    Basename 10/15/11 0400 10/14/11 1216  NA 131* 136  K 4.7 3.7  CL 95* --  CO2 28 --  BUN 40* --  CREATININE 1.20 --  GLUCOSE 245* --  CALCIUM 8.8 --    Basename 10/14/11 0900  LABPT --  INR 1.49    Neurologically intact  Assessment/Plan: 1 Day Post-Op Procedure(s) (LRB): TOTAL HIP ARTHROPLASTY ANTERIOR APPROACH (Right) Up with therapy Patient wants to leave foley in Hx of urinary retention Transfer to floor when stable medically  Riaz Onorato V 10/15/2011, 7:01 AM

## 2011-10-16 ENCOUNTER — Inpatient Hospital Stay (HOSPITAL_COMMUNITY): Payer: 59

## 2011-10-16 LAB — CBC
HCT: 24.9 % — ABNORMAL LOW (ref 39.0–52.0)
Hemoglobin: 9.1 g/dL — ABNORMAL LOW (ref 13.0–17.0)
Hemoglobin: 9.2 g/dL — ABNORMAL LOW (ref 13.0–17.0)
MCH: 32.6 pg (ref 26.0–34.0)
MCHC: 36.5 g/dL — ABNORMAL HIGH (ref 30.0–36.0)
MCHC: 36.7 g/dL — ABNORMAL HIGH (ref 30.0–36.0)
Platelets: 143 10*3/uL — ABNORMAL LOW (ref 150–400)
RBC: 2.81 MIL/uL — ABNORMAL LOW (ref 4.22–5.81)

## 2011-10-16 LAB — GLUCOSE, CAPILLARY
Glucose-Capillary: 236 mg/dL — ABNORMAL HIGH (ref 70–99)
Glucose-Capillary: 224 mg/dL — ABNORMAL HIGH (ref 70–99)
Glucose-Capillary: 300 mg/dL — ABNORMAL HIGH (ref 70–99)

## 2011-10-16 MED ORDER — CLOPIDOGREL BISULFATE 75 MG PO TABS
150.0000 mg | ORAL_TABLET | Freq: Two times a day (BID) | ORAL | Status: DC
  Filled 2011-10-16: qty 2

## 2011-10-16 MED ORDER — SPIRONOLACTONE 100 MG PO TABS
300.0000 mg | ORAL_TABLET | Freq: Once | ORAL | Status: AC
  Administered 2011-10-16: 300 mg via ORAL
  Filled 2011-10-16: qty 3

## 2011-10-16 MED ORDER — SPIRONOLACTONE 100 MG PO TABS
400.0000 mg | ORAL_TABLET | Freq: Every day | ORAL | Status: DC
  Administered 2011-10-17 – 2011-10-21 (×5): 400 mg via ORAL
  Filled 2011-10-16 (×5): qty 4

## 2011-10-16 MED ORDER — SPIRONOLACTONE 100 MG PO TABS
200.0000 mg | ORAL_TABLET | Freq: Two times a day (BID) | ORAL | Status: DC
  Filled 2011-10-16: qty 2

## 2011-10-16 MED ORDER — OXYCODONE HCL 5 MG PO TABS
10.0000 mg | ORAL_TABLET | ORAL | Status: DC | PRN
  Administered 2011-10-16 – 2011-10-21 (×21): 10 mg via ORAL
  Filled 2011-10-16 (×12): qty 2
  Filled 2011-10-16: qty 1
  Filled 2011-10-16 (×9): qty 2

## 2011-10-16 MED ORDER — FUROSEMIDE 80 MG PO TABS
80.0000 mg | ORAL_TABLET | Freq: Three times a day (TID) | ORAL | Status: DC
  Administered 2011-10-16 – 2011-10-21 (×15): 80 mg via ORAL
  Filled 2011-10-16 (×17): qty 1

## 2011-10-16 MED ORDER — NYSTATIN 100000 UNIT/GM EX CREA
TOPICAL_CREAM | Freq: Two times a day (BID) | CUTANEOUS | Status: DC
  Administered 2011-10-16 – 2011-10-18 (×3): via TOPICAL
  Filled 2011-10-16: qty 15

## 2011-10-16 MED ORDER — LACTULOSE 10 GM/15ML PO SOLN
20.0000 g | Freq: Two times a day (BID) | ORAL | Status: DC
  Administered 2011-10-16 (×2): 20 g via ORAL
  Filled 2011-10-16 (×4): qty 30

## 2011-10-16 MED ORDER — DABIGATRAN ETEXILATE MESYLATE 150 MG PO CAPS
150.0000 mg | ORAL_CAPSULE | Freq: Two times a day (BID) | ORAL | Status: DC
  Administered 2011-10-17 – 2011-10-21 (×9): 150 mg via ORAL
  Filled 2011-10-16 (×11): qty 1

## 2011-10-16 NOTE — Progress Notes (Signed)
Subjective: 2 Days Post-Op Procedure(s) (LRB): TOTAL HIP ARTHROPLASTY ANTERIOR APPROACH (Right) No complaints, comfortable    Objective: Vital signs in last 24 hours: Temp:  [97 F (36.1 C)-98.4 F (36.9 C)] 97 F (36.1 C) (11/18 0400) Pulse Rate:  [78-102] 94  (11/18 0810) Resp:  [12-20] 14  (11/18 0810) BP: (94-126)/(49-73) 108/49 mmHg (11/18 0810) SpO2:  [95 %-100 %] 100 % (11/18 0810) Weight:  [128.9 kg (284 lb 2.8 oz)] 284 lb 2.8 oz (128.9 kg) (11/18 0400)  Intake/Output from previous day: 11/17 0701 - 11/18 0700 In: 2195 [P.O.:200; I.V.:975; IV Piggyback:50] Out: 1460 [Urine:1460] Intake/Output this shift: Total I/O In: 20 [I.V.:20] Out: 325 [Urine:325]   Basename 10/16/11 0330 10/15/11 1255 10/15/11 0400 10/14/11 1216  HGB 9.1* 9.5* 10.2* 9.2*    Basename 10/16/11 0330 10/15/11 1255  WBC 6.1 6.7  RBC 2.79* 2.93*  HCT 24.9* 26.2*  PLT 100* 155    Basename 10/15/11 1255 10/15/11 0400  NA 128* 131*  K 5.1 4.7  CL 92* 95*  CO2 26 28  BUN 40* 40*  CREATININE 1.27 1.20  GLUCOSE 303* 245*  CALCIUM 8.6 8.8    Basename 10/15/11 1255 10/14/11 0900  LABPT -- --  INR 1.76* 1.49    Neurologically intact  Assessment/Plan: 2 Days Post-Op Procedure(s) (LRB): TOTAL HIP ARTHROPLASTY ANTERIOR APPROACH (Right) Up with therapy D/c to floor in AM  DUDA,MARCUS V 10/16/2011, 11:30 AM

## 2011-10-16 NOTE — Progress Notes (Signed)
Physical Therapy Treatment Patient Details Name: [name] MRN: [id number] DOB: November 21, 1951 Today's Date: 10/16/2011 [id number] 1Te  PT Assessment/Plan  PT - Assessment/Plan Comments on Treatment Session: Patient just back to bed therefore refused all but in bed exercises.   PT Plan: Discharge plan remains appropriate PT Frequency: 7X/week Follow Up Recommendations: Skilled nursing facility Equipment Recommended: Defer to next venue PT Goals  Acute Rehab PT Goals PT Goal: Perform Home Exercise Program - Progress: Progressing toward goal  PT Treatment Precautions/Restrictions  Precautions Precaution Comments: no hip precautions (arterial line removed prior to 2nd visit) Restrictions Weight Bearing Restrictions: Yes RLE Weight Bearing: Weight bearing as tolerated LLE Weight Bearing: Partial weight bearing Mobility (including Balance)   Exercise  Total Joint Exercises Ankle Circles/Pumps: AROM;Both;10 reps;Supine Quad Sets: AROM;Right;10 reps;Supine Towel Squeeze: AROM;Both;10 reps;Supine Short Arc Quad: AROM;Right;10 reps;Supine Heel Slides: AAROM;Right;10 reps;Supine Hip ABduction/ADduction: AAROM;Right;10 reps;Supine Straight Leg Raises: AAROM;Right;5 reps;Supine End of Session PT - End of Session Equipment Utilized During Treatment: Gait belt Activity Tolerance: Patient tolerated treatment well Patient left: in bed General Behavior During Session: [facility] for tasks performed Cognition: [facility] for tasks performed  [facility] 10/16/2011, 4:19 PM

## 2011-10-16 NOTE — Consult Note (Signed)
Reason for Consult: Fluid management in the setting of cirrhosis Referring Physician: [facility].  Primary GI - Wendall Papa, M.D.  [name] HPI: This is a 60 year old gentleman with multiple medication problems who was initially diagnosed with NASH cirrhosis approximately one month ago.  Since that time he was placed on diuretic and he reports a significant diuresis.  He reports losing 50 lbs during his hospitalization recently, but I am not able to confirm this assertion.  He was admitted to the hospital for an elective hip replacement.  Two days before his surgery Dr. Christella Hartigan evaluted the patient and agreed with aggressive diuresis.  He cannot recall his home regimen, but currently he is on lasix 80 mg TID and spironolactone 400 mg QD.  There is still a significant amount of fluid retention, but he is having good urine output.  His creatinine is hold steady at this time, but his BUN is mildly elevated.  At this time, I GI consultation is requested to assist in the fluid management.  Past Medical History  Diagnosis Date  . Hypertension   . Type II or unspecified type diabetes mellitus without mention of complication, not stated as uncontrolled   . Thyroid disease   . Cirrhosis, non-alcoholic   . Morbid obesity   . Pancytopenia   . Fatty liver   . Coronary artery disease   . Myocardial infarction     "HEART INCIDENT " OCT 2012  . Angina     RARE USE OF NTG  . CHF (congestive heart failure)     JUNE 2012  . Arthritis   . Gout 35 YRS AGO  . Cellulitis of left leg   . Cirrhosis of liver     NONALCOHOLIC  . Nocturia   . Elevated BUN   . Elevated serum creatinine   . Serum ammonia increased   . Splenomegaly   . Platelets decreased   . Thyroid disorder     PT DOES NOT KNOW IF LOW OR HIGH  . Obstructive sleep apnea     USES O2 WITH C - PAP SETTING FOR C-PA IS 16    Past Surgical History  Procedure Date  . Appendectomy   . Coronary stent placement     2 CARDIAC STENTS  OCT 08/2011  . Total knee arthroplasty 10/14/2011    Family History  Problem Relation Age of Onset  . Colon cancer Neg Hx   . Diabetes Mother   . Diabetes Father     Social History:  reports that he has never smoked. He has never used smokeless tobacco. He reports that he does not drink alcohol or use illicit drugs.  Allergies:  Allergies  Allergen Reactions  . Statins Other (See Comments)    PT DOES NOT REMEMBER    Medications:  Scheduled:   . aspirin  81 mg Oral Daily  . clopidogrel  150 mg Oral BID  . ferrous sulfate  325 mg Oral TID PC  . furosemide  80 mg Oral TID  . insulin aspart  0-9 Units Subcutaneous TID WC  . insulin detemir  25 Units Subcutaneous QHS  . lactulose  20 g Oral BID  . levothyroxine  50 mcg Oral QAM  . metoprolol succinate  12.5 mg Oral Daily  . multivitamins ther. w/minerals  1 tablet Oral Daily  . nystatin cream   Topical BID  . spironolactone  300 mg Oral Once  . spironolactone  400 mg Oral Daily  . DISCONTD:  docusate sodium  100 mg Oral BID  . DISCONTD: furosemide  80 mg Oral BID  . DISCONTD: rivaroxaban  10 mg Oral Q24H  . DISCONTD: spironolactone  100 mg Oral BID  . DISCONTD: spironolactone  200 mg Oral BID   Continuous:   Results for orders placed during the hospital encounter of 10/14/11 (from the past 24 hour(s))  GLUCOSE, CAPILLARY     Status: Abnormal   Collection Time   10/15/11  4:08 PM      Component Value Range   Glucose-Capillary 333 (*) 70 - 99 (mg/dL)   Comment 1 Documented in Chart     Comment 2 Notify RN    GLUCOSE, CAPILLARY     Status: Abnormal   Collection Time   10/15/11 10:09 PM      Component Value Range   Glucose-Capillary 207 (*) 70 - 99 (mg/dL)   Comment 1 Documented in Chart     Comment 2 Notify RN    CBC     Status: Abnormal   Collection Time   10/16/11  3:30 AM      Component Value Range   WBC 6.1  4.0 - 10.5 (K/uL)   RBC 2.79 (*) 4.22 - 5.81 (MIL/uL)   Hemoglobin 9.1 (*) 13.0 - 17.0 (g/dL)   HCT  16.1 (*) 09.6 - 52.0 (%)   MCV 89.2  78.0 - 100.0 (fL)   MCH 32.6  26.0 - 34.0 (pg)   MCHC 36.5 (*) 30.0 - 36.0 (g/dL)   RDW 04.5  40.9 - 81.1 (%)   Platelets 100 (*) 150 - 400 (K/uL)  GLUCOSE, CAPILLARY     Status: Abnormal   Collection Time   10/16/11  7:50 AM      Component Value Range   Glucose-Capillary 204 (*) 70 - 99 (mg/dL)   Comment 1 Documented in Chart     Comment 2 Notify RN    CBC     Status: Abnormal   Collection Time   10/16/11 12:08 PM      Component Value Range   WBC 8.6  4.0 - 10.5 (K/uL)   RBC 2.81 (*) 4.22 - 5.81 (MIL/uL)   Hemoglobin 9.2 (*) 13.0 - 17.0 (g/dL)   HCT 91.4 (*) 78.2 - 52.0 (%)   MCV 89.3  78.0 - 100.0 (fL)   MCH 32.7  26.0 - 34.0 (pg)   MCHC 36.7 (*) 30.0 - 36.0 (g/dL)   RDW 95.6  21.3 - 08.6 (%)   Platelets 143 (*) 150 - 400 (K/uL)  GLUCOSE, CAPILLARY     Status: Abnormal   Collection Time   10/16/11 12:12 PM      Component Value Range   Glucose-Capillary 236 (*) 70 - 99 (mg/dL)   Comment 1 Documented in Chart     Comment 2 Notify RN       Dg Chest 2 View  10/16/2011  *RADIOLOGY REPORT*  Clinical Data: Shortness of breath post hip surgery  CHEST - 2 VIEW  Comparison: 10/15/2011  Findings: Heart size upper limits normal.  Lungs are clear.  No effusion.  Regional bones unremarkable.  IMPRESSION:  Borderline cardiomegaly.  No acute disease.  Original Report Authenticated By: Osa Craver, M.D.   Dg Chest Port 1 View  10/15/2011  *RADIOLOGY REPORT*  Clinical Data: Congestive heart failure.  Coronary artery disease. Postoperative for hip replacement.  PORTABLE CHEST - 1 VIEW  Comparison: 10/07/2011  Findings: Low lung volumes are present, causing crowding of the  pulmonary vasculature.  Cardiac and mediastinal contours appear unremarkable.  The lungs appear clear.  No pleural effusion is observed.  IMPRESSION:  1.  No specific radiographic abnormality is observed.  Original Report Authenticated By: Dellia Cloud, M.D.    ROS:   As stated above in the HPI otherwise negative.  Blood pressure 108/63, pulse 101, temperature 99.3 F (37.4 C), temperature source Oral, resp. rate 15, height 5\' 9"  (1.753 m), weight 128.9 kg (284 lb 2.8 oz), SpO2 97.00%.    PE: Gen: NAD, Alert and Oriented HEENT:  [place]/AT, EOMI Neck: Supple, no LAD Lungs: CTA Bilaterally CV: RRR without M/G/R ABM: Soft, NTND, +BS, obese Ext: 4+ pitting edema  Assessment/Plan: 1) NASH cirrhosis. 2) S/p hip replacement. 3) Fluid overload.   I agree with the current regimen of diuretics.  He is also on a low sodium diet, however, his I/O reveal that he is positive with his fluid balance.  He is getting a significant amount via IV.  Plan: 1) Continue with the current diuretic management. 2) Minimize the IV fluids. 3) Follow creatinine and electrolytes.  Margaretann Abate D 10/16/2011, 3:32 PM

## 2011-10-16 NOTE — Progress Notes (Signed)
Physical Therapy Treatment Patient Details Name: [name] MRN: [id number] DOB: Jul 31, 1951 Today's Date: 10/16/2011 [id number] 1Te, 2Ta PT Assessment/Plan  PT - Assessment/Plan Comments on Treatment Session: Patient slow to progress with continued limited activity tolerance, decreased repitoire of mobilitiy methods and will benefit from SNF at d/c. PT Plan: Discharge plan remains appropriate PT Frequency: 7X/week Follow Up Recommendations: Skilled nursing facility Equipment Recommended: Defer to next venue PT Goals  Acute Rehab PT Goals PT Goal: Supine/Side to Sit - Progress: Progressing toward goal PT Transfer Goal: Sit to Stand/Stand to Sit - Progress: Progressing toward goal PT Goal: Ambulate - Progress: Progressing toward goal PT Goal: Perform Home Exercise Program - Progress: Progressing toward goal  PT Treatment Precautions/Restrictions  Precautions Precaution Comments: no hip precautions (arterial line removed prior to 2nd visit) Restrictions Weight Bearing Restrictions: Yes RLE Weight Bearing: Weight bearing as tolerated LLE Weight Bearing: Partial weight bearing Mobility (including Balance) Bed Mobility Supine to Sit: 1: +2 Total assist;HOB elevated (Comment degrees);With rails Supine to Sit Details (indicate cue type and reason): pt= 60%, assist for upper body mainly Sitting - Scoot to Edge of Bed: 5: Supervision Sitting - Scoot to Edge of Bed Details (indicate cue type and reason): complained of increased pain when attempting to help with pad under pt.   Transfers Sit to Stand: 1: +2 Total assist;From bed;From elevated surface;With upper extremity assist Sit to Stand Details (indicate cue type and reason): pt=80%, pulls up on rolling walker and asked for help to stabilize walker Stand to Sit: 1: +2 Total assist;To chair/3-in-1;With upper extremity assist Stand to Sit Details: pt= 90%, cues and assist for right leg out, increased time and cues for scooting back in  chair Stand Pivot Transfers: 1: +2 Total assist Stand Pivot Transfer Details (indicate cue type and reason): with regular rolling walker and assist to progress right LE, increased time and pain with right weight shift for left LE progression with pt= 75-80% Ambulation/Gait Ambulation/Gait:  (stand and turn with walker only due to pt c/o lightheaded)    Exercise  Total Joint Exercises Ankle Circles/Pumps: AROM;Both;10 reps;Supine Quad Sets: AROM;Right;10 reps;Supine Short Arc Quad: AROM;Right;10 reps;Supine Heel Slides: AAROM;Right;10 reps;Supine Hip ABduction/ADduction: AAROM;Right;10 reps;Supine End of Session PT - End of Session Equipment Utilized During Treatment: Gait belt Activity Tolerance: Patient limited by pain;Patient limited by fatigue Patient left: in chair;with call bell in reach (lift pad under patient) General Behavior During Session: [facility] for tasks performed Cognition: [facility] for tasks performed  [facility] 10/16/2011, 3:38 PM

## 2011-10-16 NOTE — Progress Notes (Addendum)
Interval history- 60 y.o. male, with a PMH as listed above most notably significant for HTN, Type 2 DM and CAD s/p stenting (with 2 active stents in place October 2012, was on Plavix for short period of time and then changed to aspirin monotherapy one week prior to surgery per Dr. Excell Seltzer) who was admitted for scheduled right hip replacement (ant. Approach) on 10/14/2011  . The patient had been suffering from severe end-stage degenerative joint pain involving the right hip. He had been cleared by his cardiologist Dr. Excell Seltzer for surgery and the patient elected to proceed with surgery despite the significant risks involved. The patient tolerated the surgery very well. He is now post op in the ICU for close monitoring given his co-morbidities but he has been awake, alert and his pain is controlled.  He has a history of CHF-was admitted in July 2012 for decompensated CHF and Has NAFLD with cirrhosis of the liver followed by Dr. Rob Bunting of the lumbar gastroenterology. Dr. Christella Hartigan in fact saw the patient 2 days ago and recommended that the patient be diuresed aggressively prior to surgery Patient has passed uneventful SDU course, is in mild pain, and had PCA morphine d/c'd-he has many good questions about his fluid status, and I explained to him and his wife in great detail that aggressive diureses probably is in order, but we would want to be careful with Electrolyte balance and other issues, as he does have some AKI 2/2 to the diuretic regimen he is on.      Assessment/Plan:    NEURO  No acute issues mentating well.    Plan:   PULM  Atelectasis/collapse (absent)    Plan: At risk for possible ARDS versus post operative issues with regards to transfusion. Will encourage BiPAP use as patient does use chronic BiPAP at home and has significant obstructive sleep apnea. Dr. Corky Sing is his home a Pulm doc-Will monitor his respiratory status closely one more day in the ICU.  Would recommend also that the  patient have a chest x-ray in the morning to determine volume status given the fact that he received 2 units of blood. He is to use his Spirometer as much as possible and will continue his nightly BI-pap.  CARDIO  Sinus Tachycardia    Plan: think this is likely secondary to respiratory versus pain issues. He could also some decompensation from acute blood loss anemia and has been transfused for the same. We'll monitor hematocrit tomorrow and we will keep him on step down unit today. Further disposition as per orthopedics.  He will likely benefit from a SNF and Dr. Magnus Ivan has seen him briefly this am Has a history of being on Plavix, which was discontinued by Dr. Excell Seltzer in the recent past-Pateitn requests Cardiology to see him and we will oblige   RENAL  Metabolic Alkalosis (moderate)  Actue Renal Failure (due to nephrotoxic agents likleluy multi factorial given acute blood loss anemia and also possibly given Lasix.)    Plan: Will give IV Lasix to 40 mg by mouth twice a day-still has some element of grade 2 pitting edema but this is likely secondary to multiple causes inclusive of nutritional and may be secondary to his alcoholic liver cirrhosis as well. He will need his spironolactone in addition to his Lasix to help offload him the ascites.   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 He is followed by Dr. Rob Bunting of gastroenterology . Agree with possible use of Zaroxolyn his case however there is a propensity foranticoagulating versus  bleeding.  Would monitor him closely with daily INRs and would also get a possible direct thrombin inhibitor level if possible.  We will have pharmacy closely monitor his liver status and we will get coagulation studies as well as complete metabolic panel every day while he is here-I have formally asked Pharamcy to help determine which anticoagulant is best short term to prevent DVT, as Xarelto appears Renally/Heaptically cleared, and his INR yesterday was 1.7    At present time he is doing well--would continue empiric beta blocker for the time being to prevent variceal bleeding  He is currently on Aldactone 100 by mouth twice a day and Lasix IV IV twice a day which will be converted to Lasix 40 twice a day. Given the fact that he has renal insufficiency with a BUN of 40  ID  Patient is to use his incentive spirometry religiously every 2 hours. Prevent atelectasis from    Plan:   HEME  Coagulopathy (liver failure)    Plan: See above discussion under GI-His hemoglobin rangesd between 9-10 Will review labs am  ENDO  Diabetes Mellitus (Type 2)    Plan: Blood sugars have been elevated to the 120-2-10 range. He is not on sliding scale and is only getting currently that her insulin 25 units we'll place him on moderate sliding scale and allow him to eat per orders of orthopedics.   Global Issues  Patient looks very stable from hemodynamic standpoint although slightly tachycardic at present time.  This is likely 2/2 to pain and I think he would be a great candidate for an Orthopedic floor to be reviewed upon        [facility] STEP-DOWN PROGRESS NOTE   Patient Details:    [name] is an 60 y.o. male.  Lines, Airways, Drains: Arterial Line 10/14/11 Left Radial (Active)  Site Assessment Clean;Intact;Dry 10/15/2011  4:00 PM  Line Status Pulsatile blood flow 10/15/2011  4:00 PM  Art Line Waveform Appropriate 10/15/2011  4:00 PM  Art Line Interventions Leveled;Zeroed and calibrated  10/15/2011  8:00 AM  Color/Movement/Sensation Capillary refill less than 3 sec 10/15/2011  4:00 PM  Dressing Type Transparent;Occlusive 10/15/2011  4:00 PM  Dressing Status Clean;Dry;Intact 10/15/2011  4:00 PM  Interventions Removed 10/15/2011  4:00 PM     Urethral Catheter Latex 14 Fr. (Active)  Site Assessment Clean;Intact 10/15/2011  8:00 PM  Collection Container Standard drainage bag 10/15/2011  8:00 PM  Securement Method Securing device (Describe) 10/15/2011  8:00 PM  Urinary Catheter Interventions Unclamped 10/15/2011  8:00 PM  Indication for Insertion or Continuance of Catheter Urinary output monitoring 10/15/2011  8:00 PM  Input (mL) 160 mL 10/15/2011  6:00 PM  Output (mL) 200 mL 10/15/2011 12:00 PM    Anti-infectives:  Anti-infectives     Start     Dose/Rate Route Frequency Ordered Stop   10/16/11 1100   ceFAZolin (ANCEF) IVPB 2 g/50 mL premix  Status:  Discontinued  2 g 100 mL/hr over 30 Minutes Intravenous  Once 10/15/11 0044 10/15/11 0422   10/15/11 1100   ceFAZolin (ANCEF) IVPB 2 g/50 mL premix        2 g 100 mL/hr over 30 Minutes Intravenous  Once 10/15/11 0424 10/15/11 1104   10/15/11 0430   ceFAZolin (ANCEF) IVPB 2 g/50 mL premix  Status:  Discontinued        2 g 100 mL/hr over 30 Minutes Intravenous  Once 10/15/11 0422 10/15/11 0424   10/14/11 1700   ceFAZolin (ANCEF) IVPB 2 g/50 mL premix        2 g 100 mL/hr over 30 Minutes Intravenous Every 6 hours 10/14/11 1544 10/15/11 0439   10/14/11 0600   ceFAZolin (ANCEF) IVPB 2 g/50 mL premix        2 g 100 mL/hr over 30 Minutes Intravenous On call to O.R. 10/13/11 1933 10/14/11 1050          Microbiology: Results for orders placed in visit on 10/07/11  SURGICAL PCR SCREEN     Status: Normal   Collection Time   10/07/11  3:20 PM      Component Value Range Status Comment   MRSA, PCR NEGATIVE  NEGATIVE  Final    Staphylococcus aureus NEGATIVE  NEGATIVE  Final     ABG    Component Value Date/Time    PHART 7.539* 10/14/2011 1216   PCO2ART 35.3 10/14/2011 1216   PO2ART 319.0* 10/14/2011 1216   HCO3 30.2* 10/14/2011 1216   TCO2 31 10/14/2011 1216   O2SAT 100.0 10/14/2011 1216     Best Practice/Protocols:  VTE Prophylaxis: Direct Thrombin Inhibitor ARDS  Events:   Studies: Dg Chest 2 View  10/07/2011  *RADIOLOGY REPORT*  Clinical Data: Preoperative evaluation.  CHEST - 2 VIEW  Comparison: 08/28/2011.  Findings: There is moderate enlargement cardiac silhouette.  Lungs are free of infiltrates.  No pleural effusions are seen.  Bones appear average for age.  There is minimal degenerative spondylosis.  IMPRESSION: No acute or active cardiopulmonary or pleural abnormalities are seen.  Original Report Authenticated By: Crawford Givens, M.D.   Dg Hip Complete Right  10/14/2011  *RADIOLOGY REPORT*  Clinical Data: Severe osteoarthritis of the right hip.  RIGHT HIP - COMPLETE 2+ VIEW  Comparison: Radiographs dated 11/28/2010  Findings: AP C-arm images demonstrate the patient has undergone a right total hip prosthesis insertion.  The prosthesis appears in excellent position in the AP projection.  No fractures.  IMPRESSION: Satisfactory appearance of the right hip after right total hip prosthesis insertion.  Original Report Authenticated By: Gwynn Burly, M.D.   Dg Pelvis Portable  10/14/2011  *RADIOLOGY REPORT*  Clinical Data: Severe osteoarthritis of the right hip.  Right total hip replacement.  PORTABLE PELVIS  Comparison: CT scan dated 05/28/2011  Findings: Right hip prosthesis appears in good position in the AP projection.  No fracture.  Normal appearing left hip.  IMPRESSION: Satisfactory appearance of the right hip after total hip replacement.  Original Report Authenticated By: Gwynn Burly, M.D.   Dg Chest Port 1 View  10/15/2011  *RADIOLOGY REPORT*  Clinical Data: Congestive heart failure.  Coronary artery disease. Postoperative for hip replacement.  PORTABLE CHEST - 1 VIEW  Comparison:  10/07/2011  Findings: Low lung volumes are present, causing crowding of the pulmonary vasculature.  Cardiac and mediastinal contours appear unremarkable.  The lungs appear clear.  No pleural effusion is observed.  IMPRESSION:  1.  No specific radiographic abnormality  is observed.  Original Report Authenticated By: Dellia Cloud, M.D.   Dg Hip Portable 1 View Right  10/14/2011  *RADIOLOGY REPORT*  Clinical Data: Osteoarthritis of the right hip.  PORTABLE RIGHT HIP - 1 VIEW  Comparison: Radiographs dated 10/14/2011  Findings: Lateral portable view of the right hip demonstrates the acetabular and femoral components of the total hip prosthesis to be in excellent position.  IMPRESSION: Satisfactory appearance in the lateral view of the right hip after total hip replacement.  Original Report Authenticated By: Gwynn Burly, M.D.   Dg Fluoro Guide Ndl Plc/bx  09/16/2011  *RADIOLOGY REPORT*  Clinical Data: Severe right hip osteoarthritis and right hip pain. Recent cardiac catheterization.  The patient is unable to tolerate discontinuation of Plavix for the procedure.  HIP INJECTION UNDER FLUOROSCOPY  Procedure: After a thorough discussion of risks and benefits of the procedure including bleeding, infection, injuries to adjacent structures and extra-articular injection, written and oral informed consent was obtained.  Verbal consent was obtained by Dr. Carlota Raspberry. Time out form completed (when appropriate). We discussed the increased risk of bleeding and hemarthrosis associated with therapeutic levels of Plavix at the time of injection.  The patient and his wife accepted the risk and wanted to proceed.  We have all agreed to risk is low and the probability of a therapeutic outcome was high.  The patient was placed supine on the fluoroscopy table. Preliminary localization of the right hip was performed.  The skin was prepped and draped in the usual sterile fashion.  Local anesthesia was provided with 1% Lidocaine  without Epinephrine. Under fluoroscopic guidance, a 22 gauge 3 1/2 inch spinal needle was advanced into the hip joint.  Subsequently injection of 3 mL omnipaque 180 contrast agent confirmed intra-articular placement. No vascular uptake. The image was captured showing intra-articular contrast and a solution of 120 mg Depo-Medrol and 1% Lidocaine without Epinephrine for a volume of 3 ml along with 2 ml of 0.50% Sensorcaine was injected into the hip.  The needles were removed and a sterile dressing applied.  The patient tolerated a procedure well and was discharged.  Fluoroscopy Time: 31 seconds  IMPRESSION:  Technically successful right hip injection of steroid and anesthetic.  Original Report Authenticated By: Andreas Newport, M.D.   Dg C-arm 61-120 Min-no Report  10/14/2011  CLINICAL DATA: severe osteoarthritis right hip   C-ARM 61-120 MINUTES  Fluoroscopy was utilized by the requesting physician.  No radiographic  interpretation.      Consults: Treatment Team:  Cleora Fleet, MD Wl2 Dimas Alexandria, MD   Subjective:    Overnight Issues:   Objective:  Vital signs for last 24 hours: Temp:  [97 F (36.1 C)-98.4 F (36.9 C)] 97 F (36.1 C) (11/18 0400) Pulse Rate:  [78-107] 94  (11/18 0810) Resp:  [12-20] 14  (11/18 0810) BP: (94-126)/(49-73) 108/49 mmHg (11/18 0810) SpO2:  [95 %-100 %] 100 % (11/18 0810) Weight:  [128.9 kg (284 lb 2.8 oz)] 284 lb 2.8 oz (128.9 kg) (11/18 0400)  Hemodynamic parameters for last 24 hours:    Intake/Output from previous day: 11/17 0701 - 11/18 0700 In: 2185 [P.O.:200; I.V.:965; IV Piggyback:50] Out: 1460 [Urine:1460]  Intake/Output this shift:        Physical Exam:  General: alert and no respiratory distress  Neuro: alert, oriented and nonfocal exam  Resp: clear to auscultation bilaterally and normal percussion bilaterally  CVS: regular rate and rhythm, S1, S2 normal, no murmur, click, rub or gallop  and SLIGHTLY TACHYCARDIC-Tele is  non-suggestive GI: soft, nontender, BS WNL, no r/g  Extremities: no edema, no erythema, pulses WNL and edema 3    LOS: 2 days   Additional comments:I have discussed and reviewed with family members patient's -wife [name] and I discussed the patient's Orthopedic surgeon with Dr. Cheri Kearns of Ortho.  Critical Care Total Time*: 45 Minutes  Seren Chaloux,JAI 10/16/2011  *Care during the described time interval was provided by me and/or other providers on the critical care team.  I have reviewed this patient's available data, including medical history, events of note, physical examination and test results as part of my evaluation.  I HAVE ALSO DISCUSSED THIS PLAN OF CARE EXHAUSTIVELY WITH PATIENT AND SURROGATE DECISION MAKERS   Unclear if he needs Ancef still?  Would defer to Orthopedic surgeon Stable to transfer to tele from my perspective-Ortho to make final call as attending

## 2011-10-16 NOTE — Progress Notes (Signed)
Pharmacy consult recommendations:  1. VTE prophylaxis s/p THA. - concern for hepatic impairment with Xarelto - recommended: Pradaxa 150mg  PO BID, Fondaparinux 2.5mg  SQ once daily, or Enoxaparin 30mg  SQ Q12 hrs - these options are renally cleared, but SCr of 1.3 is ok for renal dosing. - Pradaxa is the only oral option.  It is not FDA approved, but is recommended by the 2012 ACCP guidelines.  It is renally cleared and will need to be adjusted if the CrCl <30   2. Diuresis - spironolactone /furosemide ratio of 100mg /40mg  is the starting dose, with max of 400mg /160mg  - once daily dosing of spironolactone is best and was changed per physician order  Lynann Beaver PharmD  Pager [phone] 10/16/2011 10:55 AM

## 2011-10-17 ENCOUNTER — Encounter (HOSPITAL_COMMUNITY): Payer: Self-pay | Admitting: Orthopaedic Surgery

## 2011-10-17 DIAGNOSIS — K7689 Other specified diseases of liver: Secondary | ICD-10-CM

## 2011-10-17 DIAGNOSIS — R188 Other ascites: Secondary | ICD-10-CM

## 2011-10-17 DIAGNOSIS — K7460 Unspecified cirrhosis of liver: Secondary | ICD-10-CM

## 2011-10-17 LAB — COMPREHENSIVE METABOLIC PANEL
Alkaline Phosphatase: 51 U/L (ref 39–117)
BUN: 49 mg/dL — ABNORMAL HIGH (ref 6–23)
CO2: 30 mEq/L (ref 19–32)
GFR calc Af Amer: 73 mL/min — ABNORMAL LOW (ref >90)
GFR calc non Af Amer: 63 mL/min — ABNORMAL LOW (ref >90)
Glucose, Bld: 251 mg/dL — ABNORMAL HIGH (ref 70–99)
Potassium: 4.5 mEq/L (ref 3.5–5.1)
Total Protein: 4.9 g/dL — ABNORMAL LOW (ref 6.0–8.3)

## 2011-10-17 LAB — GLUCOSE, CAPILLARY
Glucose-Capillary: 211 mg/dL — ABNORMAL HIGH (ref 70–99)
Glucose-Capillary: 316 mg/dL — ABNORMAL HIGH (ref 70–99)

## 2011-10-17 MED ORDER — LACTULOSE 10 GM/15ML PO SOLN
30.0000 g | Freq: Three times a day (TID) | ORAL | Status: DC
  Administered 2011-10-17 (×3): 30 g via ORAL
  Filled 2011-10-17 (×8): qty 45

## 2011-10-17 NOTE — Progress Notes (Signed)
CM consult done. See CM notes in shadow chart.  Mellody Masri Wyche RN BSN CCM [phone] 10/17/2011    

## 2011-10-17 NOTE — Progress Notes (Signed)
[facility] progress note  Interval history- 60 y.o. [name], with a PMH as listed above most notably significant for HTN, Type 2 DM and CAD s/p stenting (with 2 active stents in place October 2012, was on Plavix for short period of time and then changed to aspirin monotherapy one week prior to surgery per Dr. Excell Seltzer) who was admitted for scheduled right hip replacement (ant. Approach) on 10/14/2011  . The patient had been suffering from severe end-stage degenerative joint pain involving the right hip. He had been cleared by his cardiologist Dr. Excell Seltzer for surgery and the patient elected to proceed with surgery despite the significant risks involved-his plavix was d/c'd by Dr. Daisy Floro is now on ASa and Pradaxa (not Xarelto)   The patient tolerated the surgery very well. He is now post op in the ICU for close monitoring given his co-morbidities but he has been awake, alert and his pain is controlled.  He has a history of CHF-was admitted in July 2012 for decompensated CHF and.  Has NAFLD with cirrhosis of the liver followed by Dr. Rob Bunting of the lumbar gastroenterology. Dr. Christella Hartigan in fact saw the patient 2 days prior to surgery and recommended that the patient be diuresed aggressively prior to surgery.  Patient has passed uneventful SDU course, is in mild pain, and had PCA morphine d/c'd  He is currently Hemodynamically stable and Dr. Larae Grooms and Dr. Excell Seltzer are aware,at his request, of his presence here   Assessment/Plan:    NEURO  No acute issues mentating well.    Plan:   PULM  Atelectasis/collapse (absent)    Plan: continue incentive spirometer-Dr. Corky Sing is his home a Pulm doc- chest x-ray this morning=no pulm congestion He is to use his Spirometer as much as possible and will continue his nightly BI-pap.   CARDIO  Sinus Tachycardia    Plan:  pain issues.volume depletion Has CAD-Patient requests Cardiology to see him and we will oblige-changed Xarelto yesterday to  Pradaxa bid-will need this for DVT proph per Orthopedic guidelines for DVt prevention (3-6 weeks?)  RENAL  Metabolic Alkalosis (moderate)  Actue Renal Failure (due to nephrotoxic agents likleluy multi factorial given acute blood loss anemia and also possibly given Lasix.)    Plan: Appreciate Dr. Haywood Pao assistance managing his fluid balance-CMEt from this am pending He has lost 7 liters of fluid over the past 3 days, and his electrolytes reflect AKI, with some hyponatremia and the beginnings of Hyperkalemia   GI  Hepatic Dysfunction (chronic)    Plan: Patient has non-alcoholic fatty liver disease with a MELD score of 16. He is followed by Dr. Rob Bunting of gastroenterology-he will continue Spironolactone 400 q am, with lasix 80 tid Electrolytes in am Constipated-increase Lactulose to TID, add Sennakot  ID  Patient is to use his incentive spirometry religiously every 2 hours. Prevent atelectasis from      HEME  Coagulopathy (liver failure)    Plan: See above discussion under GI-His hemoglobin rangesd between 9-10   ENDO  Diabetes Mellitus (Type 2)    Plan: Blood sugars have been elevated to the 120-2-10 range. He is not on sliding scale and is only getting currently that her insulin 25 units we'll place him on moderate sliding scale and allow him to eat per orders of orthopedics.  A1c 6.1  Global Issues  Patient looks very stable from hemodynamic standpoint although slightly tachycardic at present time.    This is likely 2/2 to pain and I think he would  be a great candidate for an Orthopedic floor to be reviewed upon     Subjective: Well-no stool since surgery.  No specific c/o.  Seems happy with the amount of urine he has put out.  No cp/n/v/sob/diarrhoea/blurred vision/double vision.Still has some element of pain in the LLE on ambulation-was OOB with PT yesterday.  Pain seems moderately well ctrll'd  Treatment Team:  Cleora Fleet, MD Wl2 Claris Pong Elroy, MD Theda Belfast Objective: Vital signs in last 24 hours: Temp:  [97.6 F (36.4 C)-100.1 F (37.8 C)] 98.9 F (37.2 C) (11/19 0800) Pulse Rate:  [80-104] 80  (11/19 0357) Resp:  [14-16] 16  (11/19 0357) BP: (108-131)/(54-65) 112/57 mmHg (11/19 0357) SpO2:  [97 %-100 %] 97 % (11/19 0357) Weight:  [128.8 kg (283 lb 15.2 oz)] 283 lb 15.2 oz (128.8 kg) (11/19 0000) Weight change: -0.1 kg (-3.5 oz)  Intake/Output Summary (Last 24 hours) at 10/17/11 0819 Last data filed at 10/17/11 0500  Gross per 24 hour  Intake    800 ml  Output   3275 ml  Net  -2475 ml   Physical Exam:  General: alert and no respiratory distress  Neuro: alert, oriented and nonfocal exam  Resp: clear to auscultation bilaterally and normal percussion bilaterally  CVS: regular rate and rhythm, S1, S2 normal, no murmur, click, rub or gallop and mildy tachycardic GI: soft, nontender, BS WNL, no r/g  Extremities: no edema, no erythema, pulses WNL and edema 3 Wound appears clean and well healed.  Lab Results:  Basename 10/15/11 1255 10/15/11 0400  NA 128* 131*  K 5.1 4.7  CL 92* 95*  CO2 26 28  GLUCOSE 303* 245*  BUN 40* 40*  CREATININE 1.27 1.20  CALCIUM 8.6 8.8  MG -- 1.8  PHOS -- --    Basename 10/15/11 1255 10/15/11 0400  AST 54* 30  ALT 16 16  ALKPHOS 46 58  BILITOT 1.7* 2.5*  PROT 5.5* 5.5*  ALBUMIN 2.2* 2.4*   No results found for this basename: LIPASE:2,AMYLASE:2 in the last 72 hours  Basename 10/16/11 1208 10/16/11 0330  WBC 8.6 6.1  NEUTROABS -- --  HGB 9.2* 9.1*  HCT 25.1* 24.9*  MCV 89.3 89.2  PLT 143* 100*   No results found for this basename: CKTOTAL:3,CKMB:3,CKMBINDEX:3,TROPONINI:3 in the last 72 hours No results found for this basename: POCBNP:3 in the last 72 hours No results found for this basename: DDIMER:2 in the last 72 hours  Basename 10/15/11 0400  HGBA1C 6.1*   No results found for this basename: CHOL:2,HDL:2,LDLCALC:2,TRIG:2,CHOLHDL:2,LDLDIRECT:2 in the last 72 hours No  results found for this basename: TSH,T4TOTAL,FREET3,T3FREE,THYROIDAB in the last 72 hours No results found for this basename: VITAMINB12:2,FOLATE:2,FERRITIN:2,TIBC:2,IRON:2,RETICCTPCT:2 in the last 72 hours Micro Results: Recent Results (from the past 240 hour(s))  SURGICAL PCR SCREEN     Status: Normal   Collection Time   10/07/11  3:20 PM      Component Value Range Status Comment   MRSA, PCR NEGATIVE  NEGATIVE  Final    Staphylococcus aureus NEGATIVE  NEGATIVE  Final     All of the Patients imaging studies have been reviewed-Please refer to prior notes  Medications: I have reviewed the patient's current medications. Scheduled Meds:   . aspirin  81 mg Oral Daily  . dabigatran  150 mg Oral Q12H  . ferrous sulfate  325 mg Oral TID PC  . furosemide  80 mg Oral TID  . insulin aspart  0-9 Units Subcutaneous TID  WC  . insulin detemir  25 Units Subcutaneous QHS  . lactulose  20 g Oral BID  . levothyroxine  50 mcg Oral QAM  . metoprolol succinate  12.5 mg Oral Daily  . multivitamins ther. w/minerals  1 tablet Oral Daily  . nystatin cream   Topical BID  . spironolactone  300 mg Oral Once  . spironolactone  400 mg Oral Daily  . DISCONTD: clopidogrel  150 mg Oral BID  . DISCONTD: docusate sodium  100 mg Oral BID  . DISCONTD: furosemide  80 mg Oral BID  . DISCONTD: rivaroxaban  10 mg Oral Q24H  . DISCONTD: spironolactone  100 mg Oral BID  . DISCONTD: spironolactone  200 mg Oral BID   Continuous Infusions:  PRN Meds:.alum & mag hydroxide-simeth, bisacodyl, bisacodyl, diphenhydrAMINE, magnesium hydroxide, menthol-cetylpyridinium, methocarbamol(ROBAXIN) IV, methocarbamol, metoCLOPramide, morphine, ondansetron (ZOFRAN) IV, ondansetron, oxyCODONE, phenol, temazepam, DISCONTD: HYDROcodone-acetaminophen, DISCONTD: oxyCODONE, DISCONTD: polyethylene glycol, DISCONTD: sodium phosphate Assessment/Plan: Patient Active Problem List  Diagnoses  . AODM  . OBSTRUCTIVE SLEEP APNEA  . Essential  hypertension, benign  . Cirrhosis  . Coronary atherosclerosis of native coronary artery  . Hyperlipidemia  . Morbid obesity  . Arthritis pain of hip  . CRI (chronic renal insufficiency)  . NAFLD (nonalcoholic fatty liver disease)  . Anemia   PLAN-TRANSFER TO TELE asked Dr. Larae Grooms to kindly manage his other chronic Hepatic issues-at patient's request asked Dr. Excell Seltzer to manage his cardiac issues-at patient's request  If he stabilizes over the next 24-48 hours, he likely will be medically clear to go to SNF-I am available if you have any specific medical questions today, and will review his briefly tomorrow if needed, but think I can sign off today, as he will be seen by 1- 2 other internal medicine physicians today as well.  Thank you very much for the consult and please call me if needed.  Pleas Koch, MD [facility] [phone]    LOS: 3 days   [facility] 10/17/2011, 8:19 AM

## 2011-10-17 NOTE — Progress Notes (Signed)
Physical Therapy Treatment Patient Details Name: [name] MRN: [id number] DOB: July 12, 1951 Today's Date: 10/17/2011 1355-1415 1Gt  PT Assessment/Plan  PT - Assessment/Plan Comments on Treatment Session: Patient progressing with tolerance to movement, still with increased time needed for all mobility. PT Plan: Discharge plan remains appropriate PT Frequency: 7X/week Follow Up Recommendations: Skilled nursing facility Equipment Recommended: Defer to next venue PT Goals  Acute Rehab PT Goals PT Goal: Supine/Side to Sit - Progress: Progressing toward goal PT Transfer Goal: Sit to Stand/Stand to Sit - Progress: Progressing toward goal PT Goal: Ambulate - Progress: Progressing toward goal  PT Treatment Precautions/Restrictions  Precautions Precaution Comments: none Restrictions Weight Bearing Restrictions: Yes RLE Weight Bearing: Weight bearing as tolerated LLE Weight Bearing: Partial weight bearing Mobility (including Balance) Bed Mobility Sit to Supine - Right: 3: Mod assist Sit to Supine - Right Details (indicate cue type and reason): assist for both LE's, and for positioining in bed Transfers Stand Pivot Transfers: With armrests Stand Pivot Transfer Details (indicate cue type and reason): from bariatric 3:1 Ambulation/Gait Ambulation/Gait Assistance: 4: Min assist Ambulation/Gait Assistance Details (indicate cue type and reason): to side step to [facility] and back up to bed Ambulation Distance (Feet): 8 Feet Assistive device: Rolling walker  Static Standing Balance Static Standing - Balance Support: Bilateral upper extremity supported Static Standing - Level of Assistance: 5: Stand by assistance Static Standing - Comment/# of Minutes: 1 minute while PT positioning bed in room Exercise    End of Session PT - End of Session Equipment Utilized During Treatment: Gait belt Activity Tolerance: Patient tolerated treatment well Patient left: in bed General Behavior During  Session: [facility] for tasks performed Cognition: [facility] for tasks performed  [facility] 10/17/2011, 3:54 PM

## 2011-10-17 NOTE — Progress Notes (Signed)
Inpatient Diabetes Program Recommendations  AACE/ADA: New Consensus Statement on Inpatient Glycemic Control (2009)  Target Ranges:  Prepandial:   less than 140 mg/dL      Peak postprandial:   less than 180 mg/dL (1-2 hours)      Critically ill patients:  140 - 180 mg/dL   Reason for Visit:  CBGs 11/18: 204/ 236/ 234/ 300 mg/dl PO intake 72% of meals Fasting sugars and postprandial sugars elevated since admission.  Inpatient Diabetes Program Recommendations Insulin - Basal: Please increase Levemir to 30 units QHS. Correction (SSI): Please add HS scale coverage to current SSI regimen. Insulin - Meal Coverage: Please add Novolog 4 units tid with meals. Diet: Please change diet to Carbohydrate Modified Medium to restrict carbohydrates.  Note: Good control at home as evidenced by A1C of 6.1%

## 2011-10-17 NOTE — Progress Notes (Signed)
I have personally reviewed the above note and agree with the plan of care.Although  His weight has not changed, he has positive outputs .Renal function unchanged. Consider adding Zaroxalyn 2.5 mg po qd to potentiate effect of Lasix. He has inspiratory rales on my exam. No asterixis.  Will be doing to ? [facility]. after discharge. Lina Sar, MD

## 2011-10-17 NOTE — Progress Notes (Signed)
Occupational Therapy Evaluation Patient Details Name: [name] MRN: [id number] DOB: Jun 18, 1951 Today's Date: 10/17/2011 0945 1028 EV2 [place]  Problem List:  Patient Active Problem List  Diagnoses  . AODM  . OBSTRUCTIVE SLEEP APNEA  . Essential hypertension, benign  . Cirrhosis  . Coronary atherosclerosis of native coronary artery  . Hyperlipidemia  . Morbid obesity  . Arthritis pain of hip  . CRI (chronic renal insufficiency)  . NAFLD (nonalcoholic fatty liver disease)  . Anemia    Past Medical History:  Past Medical History  Diagnosis Date  . Hypertension   . Type II or unspecified type diabetes mellitus without mention of complication, not stated as uncontrolled   . Thyroid disease   . Cirrhosis, non-alcoholic   . Morbid obesity   . Pancytopenia   . Fatty liver   . Coronary artery disease   . Myocardial infarction     "HEART INCIDENT " OCT 2012  . Angina     RARE USE OF NTG  . CHF (congestive heart failure)     JUNE 2012  . Arthritis   . Gout 35 YRS AGO  . Cellulitis of left leg   . Cirrhosis of liver     NONALCOHOLIC  . Nocturia   . Elevated BUN   . Elevated serum creatinine   . Serum ammonia increased   . Splenomegaly   . Platelets decreased   . Thyroid disorder     PT DOES NOT KNOW IF LOW OR HIGH  . Obstructive sleep apnea     USES O2 WITH C - PAP SETTING FOR C-PA IS 16   Past Surgical History:  Past Surgical History  Procedure Date  . Appendectomy   . Coronary stent placement     2 CARDIAC STENTS OCT 08/2011  . Total knee arthroplasty 10/14/2011    OT Assessment/Plan/Recommendation OT Assessment Clinical Impression Statement: pt would benefit from skilled OT secondary to deficits listed below with supervision to min A goals OT Recommendation/Assessment: Patient will need skilled OT in the acute care venue OT Problem List: Decreased strength;Decreased activity tolerance;Cardiopulmonary status limiting activity OT Therapy Diagnosis :  Generalized weakness OT Plan OT Frequency: Min 1X/week OT Treatment/Interventions: Self-care/ADL training;Therapeutic exercise;Patient/family education;Therapeutic activities OT Recommendation Follow Up Recommendations: Skilled nursing facility Equipment Recommended: Defer to next venue Individuals Consulted Consulted and Agree with Results and Recommendations: Patient OT Goals Acute Rehab OT Goals OT Goal Formulation: With patient Time For Goal Achievement: 7 days ADL Goals Pt Will Perform Grooming: with supervision;Standing at sink ADL Goal: Grooming - Progress: Progressing toward goals Pt Will Transfer to Toilet: with min assist;Ambulation;Extra wide 3-in-1 ADL Goal: Toilet Transfer - Progress: Progressing toward goals Pt Will Perform Toileting - Clothing Manipulation: with supervision;Standing ADL Goal: Toileting - Clothing Manipulation - Progress: Progressing toward goals Pt Will Perform Toileting - Hygiene: with supervision;Standing at 3-in-1/toilet  OT Evaluation Precautions/Restrictions  Precautions Precaution Comments: none Restrictions Weight Bearing Restrictions: Yes RLE Weight Bearing: Weight bearing as tolerated  Prior Functioning Home Living Home Adaptive Equipment: Bedside commode/3-in-1 Prior Function Level of Independence: Independent with basic ADLs;Other (comment) (with ae) ADL ADL Grooming: Simulated;Set up Where Assessed - Grooming: Sitting, chair;Supported Upper Body Bathing: Simulated;Set up Where Assessed - Upper Body Bathing: Sitting, chair;Supported Lower Body Bathing: Simulated;+2 Total assistance;Other (comment);Comment for patient % (75% bathing and 85% for sit to stand) Where Assessed - Lower Body Bathing: Sit to stand from chair Upper Body Dressing: Simulated;Set up Where Assessed - Upper Body Dressing: Sitting, chair;Supported Lower  Body Dressing: Simulated;+2 Total assistance;Comment for patient % (50% dressing with AE; 85% sit to  stand) Where Assessed - Lower Body Dressing: Sit to stand from chair Toilet Transfer: Simulated;+2 Total assistance;Comment for patient % (85%) Toilet Transfer Method: Stand pivot Toilet Transfer Equipment: Other (comment) (recliner) Toileting - Clothing Manipulation: Simulated;Comment for patient %;+2 Total assistance (75% and 85% sit to stand) Where Assessed - Toileting Clothing Manipulation: Sit to stand from 3-in-1 or toilet Toileting - Hygiene: +2 Total assistance;Comment for patient % (85%) Equipment Used: Rolling walker ADL Comments: cotx with PT.  Pt has all AE and has been using it.  He has weakness in RLE and needs assist with it right now.  Fatiques easily Vision/Perception  Vision - History Baseline Vision: No visual deficits Patient Visual Report: No change from baseline Cognition Cognition Overall Cognitive Status: Appears within functional limits for tasks assessed Sensation/Coordination   Extremity Assessment RUE Assessment RUE Assessment: Within Functional Limits LUE Assessment LUE Assessment: Within Functional Limits Mobility  Bed Mobility Supine to Sit: 1: +2 Total assist;Other (comment) (pt 80) Supine to Sit Details (indicate cue type and reason): pt = 8- %, assist for upper body  Sitting - Scoot to Edge of Bed: 3: Mod assist Sitting - Scoot to Edge of Bed Details (indicate cue type and reason): using pad under patient Transfers Sit to Stand: 1: +2 Total assist;Patient percentage (comment) (85) Sit to Stand Details (indicate cue type and reason): pt=85%, increased height of bed and cues for pushing from bed (right hand at footboard) Stand to Sit: 3: Mod assist;With upper extremity assist;To chair/3-in-1 Stand to Sit Details: increased time and cues for technique Exercises Total Joint Exercises Ankle Circles/Pumps: AROM;Both;10 reps;Supine End of Session OT - End of Session Equipment Utilized During Treatment: Gait belt Activity Tolerance: Patient limited  by fatigue Patient left: with call bell in reach;in chair General Behavior During Session: [facility] for tasks performed Cognition: [facility] for tasks performed   Melodie Ashworth  [phone]  10/17/2011, 12:13 PM

## 2011-10-17 NOTE — Progress Notes (Signed)
  [facility] Progress Note  Subjective:  Patient is #3 status post right hip replacement, he is doing well. His output has been good the past 2 days, weight is stable since admit. His diuretics were increased a couple of days prior to admission and he has been being maintained at those dosages with Lasix 80 mg 3 times daily and spironolactone 400 mg daily. He is very concerned about continuing the diuresis while he is here in the hospital. Objective: Vital signs in last 24 hours: Temp:  [97.6 F (36.4 C)-100.1 F (37.8 C)] 98.9 F (37.2 C) (11/19 0800) Pulse Rate:  [80-104] 95  (11/19 0945) Resp:  [14-21] 21  (11/19 0800) BP: (100-131)/(57-65) 100/57 mmHg (11/19 0945) SpO2:  [97 %-100 %] 98 % (11/19 0945) Weight:  [128.8 kg (283 lb 15.2 oz)] 283 lb 15.2 oz (128.8 kg) (11/19 0000)   General:   Alert,  Well-developed,  white male obese up in a chair. Heart:  Regular rate and rhythm; no murmurs Abdomen:  Soft, obese, no pitting edema in the flanks. No appreciable fluid wave Normal bowel sounds, without guarding, and without rebound.   Extremities:  2+ edema to the knees laterally, he does not have any pitting edema in the upper extremities. Neurologic:  Alert and  oriented x4;  grossly normal neurologically. No asterixis Psych:  Alert and cooperative. Normal mood and affect.  Intake/Output from previous day: He is -2430 cc past 24 hours 11/18 0701 - 11/19 0700 In: 1170 [P.O.:960; I.V.:210] Out: 3600 [Urine:3600] Intake/Output this shift: Total I/O In: 360 [P.O.:360] Out: -   Lab Results:  Basename 10/16/11 1208 10/16/11 0330 10/15/11 1255  WBC 8.6 6.1 6.7  HGB 9.2* 9.1* 9.5*  HCT 25.1* 24.9* 26.2*  PLT 143* 100* 155   BMET  Basename 10/17/11 0940 10/15/11 1255 10/15/11 0400  NA 128* 128* 131*  K 4.5 5.1 4.7  CL 92* 92* 95*  CO2 30 26 28   GLUCOSE 251* 303* 245*  BUN 49* 40* 40*  CREATININE 1.21 1.27 1.20  CALCIUM 8.2* 8.6 8.8   LFT  Basename 10/17/11  0940  PROT 4.9*  ALBUMIN 1.9*  AST 33  ALT 13  ALKPHOS 51  BILITOT 1.1  BILIDIR --  IBILI --   PT/INR  Basename 10/15/11 1255  LABPROT 20.8*  INR 1.76*        Studies/Results: Dg Chest 2 View  10/16/2011  *RADIOLOGY REPORT*  Clinical Data: Shortness of breath post hip surgery  CHEST - 2 VIEW  Comparison: 10/15/2011  Findings: Heart size upper limits normal.  Lungs are clear.  No effusion.  Regional bones unremarkable.  IMPRESSION:  Borderline cardiomegaly.  No acute disease.  Original Report Authenticated By: Osa Craver, M.D.     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 Will continue Lasix 80 mg 3 times daily and spironolactone 400 mg by mouth daily, and monitor I/O and daily weights. At 2 g sodium restriction to current diet. No obvious encephalopathy continue  on Chronulac. Active Problems:  AODM  OBSTRUCTIVE SLEEP APNEA  Essential hypertension, benign  Cirrhosis  Coronary atherosclerosis of native coronary artery  Morbid obesity  Arthritis pain of hip  CRI (chronic renal insufficiency)  NAFLD (nonalcoholic fatty liver disease)  Anemia     LOS: 3 days   [name]  10/17/2011, 11:52 AM

## 2011-10-17 NOTE — Progress Notes (Signed)
Subjective: 3 Days Post-Op Procedure(s) (LRB): TOTAL HIP ARTHROPLASTY ANTERIOR APPROACH (Right) Patient reports pain as moderate.    Objective: Vital signs in last 24 hours: Temp:  [97.6 F (36.4 C)-100.1 F (37.8 C)] 97.6 F (36.4 C) (11/19 0400) Pulse Rate:  [80-104] 80  (11/19 0357) Resp:  [14-16] 16  (11/19 0357) BP: (108-131)/(49-65) 112/57 mmHg (11/19 0357) SpO2:  [97 %-100 %] 97 % (11/19 0357) Weight:  [128.8 kg (283 lb 15.2 oz)] 283 lb 15.2 oz (128.8 kg) (11/19 0000)  Intake/Output from previous day: 11/18 0701 - 11/19 0700 In: 1130 [P.O.:960; I.V.:170] Out: 2900 [Urine:2900] Intake/Output this shift: Total I/O In: 60 [I.V.:60] Out: 1000 [Urine:1000]   Basename 10/16/11 1208 10/16/11 0330 10/15/11 1255 10/15/11 0400 10/14/11 1216  HGB 9.2* 9.1* 9.5* 10.2* 9.2*    Basename 10/16/11 1208 10/16/11 0330  WBC 8.6 6.1  RBC 2.81* 2.79*  HCT 25.1* 24.9*  PLT 143* 100*    Basename 10/15/11 1255 10/15/11 0400  NA 128* 131*  K 5.1 4.7  CL 92* 95*  CO2 26 28  BUN 40* 40*  CREATININE 1.27 1.20  GLUCOSE 303* 245*  CALCIUM 8.6 8.8    Basename 10/15/11 1255 10/14/11 0900  LABPT -- --  INR 1.76* 1.49    Neurovascular intact Incision: scant drainage No cellulitis present Compartment soft  Assessment/Plan: 3 Days Post-Op Procedure(s) (LRB): TOTAL HIP ARTHROPLASTY ANTERIOR APPROACH (Right) Discharge to SNF eventually.  Will let hospitalist assist with when to transfer to ortho floor.  Appreciate their assistance with this medically complex patient.  Will need social work consult for eventual SNF placement.  Abdullahi Vallone Y 10/17/2011, 6:44 AM

## 2011-10-17 NOTE — Progress Notes (Signed)
Physical Therapy Treatment Patient Details Name: [name] MRN: [id number] DOB: [birth year] Today's Date: 10/17/2011 4098-1191 1Gt, 1Ta  PT Assessment/Plan  PT - Assessment/Plan Comments on Treatment Session: Patient seen for co-treatment with OT.  Requires increased time for all mobility, but performing mostly with +1 assistance.  Patient still somewhat dizzy during treatment, however able to work through and continue with ambulation. PT Plan: Discharge plan remains appropriate PT Frequency: 7X/week Follow Up Recommendations: Skilled nursing facility Equipment Recommended: Defer to next venue PT Goals  Acute Rehab PT Goals PT Goal: Supine/Side to Sit - Progress: Progressing toward goal PT Transfer Goal: Sit to Stand/Stand to Sit - Progress: Progressing toward goal PT Goal: Ambulate - Progress: Progressing toward goal PT Goal: Perform Home Exercise Program - Progress: Progressing toward goal  PT Treatment Precautions/Restrictions  Precautions Precaution Comments: none Restrictions Weight Bearing Restrictions: Yes RLE Weight Bearing: Weight bearing as tolerated LLE Weight Bearing: Partial weight bearing Mobility (including Balance) Bed Mobility Supine to Sit: 1: +2 Total assist Supine to Sit Details (indicate cue type and reason): pt = 8- %, assist for upper body  Sitting - Scoot to Edge of Bed: 3: Mod assist Sitting - Scoot to Edge of Bed Details (indicate cue type and reason): using pad under patient Transfers Sit to Stand: 1: +2 Total assist;From bed;With upper extremity assist Sit to Stand Details (indicate cue type and reason): pt=85%, increased height of bed and cues for pushing from bed (right hand at footboard) Stand to Sit: 3: Mod assist;With upper extremity assist;To chair/3-in-1 Stand to Sit Details: increased time and cues for technique Ambulation/Gait Ambulation/Gait Assistance: 3: Mod assist Ambulation/Gait Assistance Details (indicate cue type and reason):  assist to progress right LE 50% of the time and cues for sequence, technique, left step length; increased time for all mobility Ambulation Distance (Feet): 12 Feet Assistive device: Rolling walker (bariatric walker) Gait Pattern: Shuffle;Decreased step length - left    Exercise  Total Joint Exercises Ankle Circles/Pumps: AROM;Both;10 reps;Supine End of Session PT - End of Session Equipment Utilized During Treatment: Gait belt Activity Tolerance: Patient tolerated treatment well Patient left: in chair;with call bell in reach Nurse Communication: Mobility status for transfers General Behavior During Session: [facility] for tasks performed Cognition: [facility] for tasks performed  [facility] 10/17/2011, 11:45 AM

## 2011-10-18 DIAGNOSIS — R188 Other ascites: Secondary | ICD-10-CM

## 2011-10-18 DIAGNOSIS — K7460 Unspecified cirrhosis of liver: Secondary | ICD-10-CM

## 2011-10-18 DIAGNOSIS — K7689 Other specified diseases of liver: Secondary | ICD-10-CM

## 2011-10-18 LAB — TYPE AND SCREEN
ABO/RH(D): A POS
Antibody Screen: NEGATIVE
Unit division: 0

## 2011-10-18 LAB — GLUCOSE, CAPILLARY: Glucose-Capillary: 268 mg/dL — ABNORMAL HIGH (ref 70–99)

## 2011-10-18 MED ORDER — LACTULOSE 10 GM/15ML PO SOLN
20.0000 g | Freq: Three times a day (TID) | ORAL | Status: DC
  Administered 2011-10-18 – 2011-10-21 (×8): 20 g via ORAL
  Filled 2011-10-18 (×12): qty 30

## 2011-10-18 MED ORDER — NYSTATIN 100000 UNIT/GM EX POWD
Freq: Two times a day (BID) | CUTANEOUS | Status: DC
  Administered 2011-10-18 – 2011-10-20 (×5): via TOPICAL
  Filled 2011-10-18: qty 15

## 2011-10-18 MED ORDER — CYCLOBENZAPRINE HCL 10 MG PO TABS
10.0000 mg | ORAL_TABLET | Freq: Three times a day (TID) | ORAL | Status: DC | PRN
  Administered 2011-10-18 – 2011-10-20 (×3): 10 mg via ORAL
  Filled 2011-10-18 (×3): qty 1

## 2011-10-18 NOTE — Progress Notes (Signed)
CSW spoke with pt and completed psychosocial assessment (pls see shadow chart). Pt is agreeable to SNF for short term rehab. Pt would like to go to [facility]. Pt has support from his wife Eunice Blase. CSW will do an FL- 2 and place in the pts chart today. CSW will continue to offer support.  Patrice Paradise, LCSWA 10/18/2011 8:59 AM [phone]

## 2011-10-18 NOTE — Progress Notes (Signed)
Physical Therapy Treatment Patient Details Name: [name] MRN: [id number] DOB: 12/09/1950 Today's Date: 10/18/2011 15:40 - 16:20 1gt  1 te  1ta PT Assessment/Plan  PT - Assessment/Plan Comments on Treatment Session: pt states"it get easier" and he is eager to D/C to [facility] PT Plan: Discharge plan remains appropriate PT Frequency: 7X/week Follow Up Recommendations: Skilled nursing facility Equipment Recommended: Defer to next venue PT Goals  Acute Rehab PT Goals PT Goal Formulation: With patient Pt will go Supine/Side to Sit: with supervision PT Goal: Supine/Side to Sit - Progress: Progressing toward goal Pt will Transfer Sit to Stand/Stand to Sit: with supervision PT Transfer Goal: Sit to Stand/Stand to Sit - Progress: Progressing toward goal Pt will Ambulate: 51 - 150 feet;with supervision;with least restrictive assistive device PT Goal: Ambulate - Progress: Progressing toward goal Pt will Perform Home Exercise Program: with supervision, verbal cues required/provided PT Goal: Perform Home Exercise Program - Progress: Progressing toward goal  PT Treatment Precautions/Restrictions  Precautions Precautions: Anterior Hip Precaution Booklet Issued: No Precaution Comments: none Required Braces or Orthoses: No Restrictions Weight Bearing Restrictions: No RLE Weight Bearing: Weight bearing as tolerated LLE Weight Bearing: Weight bearing as tolerated Mobility (including Balance) Bed Mobility Bed Mobility: Yes Supine to Sit: 1: +2 Total assist (total assist + 2 pt 50% increased time and HOB increased 55 ) Sitting - Scoot to Edge of Bed: 1: +2 Total assist (difficulty scooting and increased time) Sit to Supine - Right: 1: +2 Total assist Sit to Supine - Right Details (indicate cue type and reason): total assist + 2 increased time and pt 50%.  Max assist to support B LE up into bed Transfers Transfers: Yes Sit to Stand: 1: +2 Total assist;From chair/3-in-1 Sit to Stand  Details (indicate cue type and reason): total assist + 2 pt 75% elevated chair 25% VC's on hand placement Stand to Sit: 1: +2 Total assist;To bed Stand to Sit Details: + 2 assist for safety and increased to to complete task Ambulation/Gait Ambulation/Gait: Yes Ambulation/Gait Assistance: 1: +2 Total assist Ambulation/Gait Assistance Details (indicate cue type and reason): Total assist + 2 pt 75% with increased time to weight shift and advance eith LE...Marland KitchenMarland Kitchen59% VC's on turn completeion and direction Ambulation Distance (Feet): 3 Feet Assistive device: Rolling walker Gait Pattern: Step-to pattern Gait velocity: pt tends to keep R LE ext rotated throughout all act Stairs: No Wheelchair Mobility Wheelchair Mobility: No    Exercise  Total Joint Exercises Ankle Circles/Pumps: AROM;Both;10 reps Quad Sets: AROM;Both;10 reps Gluteal Sets: AROM;Both;10 reps Short Arc Quad: AROM;Right;10 reps Heel Slides: AAROM;Right;5 reps Straight Leg Raises: AAROM;Right;5 reps End of Session PT - End of Session Equipment Utilized During Treatment: Gait belt Activity Tolerance: Patient tolerated treatment well Patient left: in bed;with call bell in reach General Behavior During Session: [facility] for tasks performed Cognition: [facility] for tasks performed  Felecia Shelling PTA [facility]  Acute  Rehab Pager     [phone]

## 2011-10-18 NOTE — Progress Notes (Signed)
Inpatient Diabetes Program Recommendations  AACE/ADA: New Consensus Statement on Inpatient Glycemic Control (2009)  Target Ranges:  Prepandial:   less than 140 mg/dL      Peak postprandial:   less than 180 mg/dL (1-2 hours)      Critically ill patients:  140 - 180 mg/dL   Reason for Visit:  CBGs 11/19: 211/ 256/ 223/ 316 mg/dl  Inpatient Diabetes Program Recommendations Insulin - Basal: Please increase [name] to 30 units QHS. Correction (SSI): Please add HS scale coverage to current SSI regimen. Insulin - Meal Coverage: Please add Novolog 4 units tid with meals. Diet: Please change diet to Carbohydrate Modified Medium to restrict carbohydrates.  Note:

## 2011-10-18 NOTE — Progress Notes (Signed)
Subjective: 4 Days Post-Op Procedure(s) (LRB): TOTAL HIP ARTHROPLASTY ANTERIOR APPROACH (Right) No acute changes overnight.  Transferred to floor from ICU. Very slow mobility with PT.   Objective: Vital signs in last 24 hours: Temp:  [97.4 F (36.3 C)-98.9 F (37.2 C)] 97.4 F (36.3 C) (11/20 0545) Pulse Rate:  [81-99] 83  (11/20 0545) Resp:  [15-21] 20  (11/20 0545) BP: (96-116)/(54-65) 116/65 mmHg (11/20 0545) SpO2:  [94 %-99 %] 94 % (11/20 0545) Weight:  [121.02 kg (266 lb 12.8 oz)] 266 lb 12.8 oz (121.02 kg) (11/20 0545)  Intake/Output from previous day: 11/19 0701 - 11/20 0700 In: 840 [P.O.:840] Out: 4850 [Urine:4850] Intake/Output this shift: Total I/O In: 120 [P.O.:120] Out: 2800 [Urine:2800]   Basename 10/16/11 1208 10/16/11 0330 10/15/11 1255  HGB 9.2* 9.1* 9.5*    Basename 10/16/11 1208 10/16/11 0330  WBC 8.6 6.1  RBC 2.81* 2.79*  HCT 25.1* 24.9*  PLT 143* 100*    Basename 10/17/11 0940 10/15/11 1255  NA 128* 128*  K 4.5 5.1  CL 92* 92*  CO2 30 26  BUN 49* 40*  CREATININE 1.21 1.27  GLUCOSE 251* 303*  CALCIUM 8.2* 8.6    Basename 10/15/11 1255  LABPT --  INR 1.76*    Exam: Incision clean/dry/intact. Leg lengths equal.  Assessment/Plan: 4 Days Post-Op Procedure(s) (LRB): TOTAL HIP ARTHROPLASTY ANTERIOR APPROACH (Right) Discharge to SNF this week as medical status continues to improve.  BLACKMAN,CHRISTOPHER Y 10/18/2011, 7:00 AM

## 2011-10-18 NOTE — Progress Notes (Signed)
Physical Therapy Treatment Patient Details Name: TOR TSUDA MRN: [id number] DOB: 1951/03/16 Today's Date: 10/18/2011 13;45 - 14;30 2 gt 1 ta PT Assessment/Plan  Pt awaiting placement at SNF for Rehab PT Goals  Acute Rehab PT Goals PT Goal Formulation: With patient Pt will go Supine/Side to Sit: with supervision PT Goal: Supine/Side to Sit - Progress: Progressing toward goal Pt will Transfer Sit to Stand/Stand to Sit: with supervision PT Transfer Goal: Sit to Stand/Stand to Sit - Progress: Progressing toward goal Pt will Ambulate: 51 - 150 feet;with least restrictive assistive device;with supervision PT Goal: Ambulate - Progress: Progressing toward goal Pt will Perform Home Exercise Program: with supervision, verbal cues required/provided PT Goal: Perform Home Exercise Program - Progress: Progressing toward goal  PT Treatment Precautions/Restrictions  Precautions Precautions: Anterior Hip Precaution Booklet Issued: No Precaution Comments: none Required Braces or Orthoses: No Restrictions Weight Bearing Restrictions: No RLE Weight Bearing: Weight bearing as tolerated Mobility (including Balance) Bed Mobility Bed Mobility: Yes Supine to Sit: 1: +2 Total assist (total assist + 2 pt 50% increased time and HOB increased 55 ) Sitting - Scoot to Edge of Bed: 1: +2 Total assist (difficulty scooting and increased time) Transfers Transfers: Yes Sit to Stand: 1: +2 Total assist (total assit +2 elevated bed pt 50%) Stand to Sit: 1: +2 Total assist (total assist + 2 50% VC's hand placement and increased time) Ambulation/Gait Ambulation/Gait: Yes Ambulation/Gait Assistance: 1: +2 Total assist (total assist + @ pt 75% increased time X 2 plus 50% VC's to ) Ambulation/Gait Assistance Details (indicate cue type and reason): 50% VC's to increase R knee flexion and heel strike Ambulation Distance (Feet): 55 Feet Assistive device: Rolling walker Gait Pattern: Step-to pattern Stairs:  No Wheelchair Mobility Wheelchair Mobility: No    Exercise    End of Session PT - End of Session Equipment Utilized During Treatment: Gait belt Activity Tolerance: Patient tolerated treatment well (required increased time and freq rest breaks) Patient left: in chair;with call bell in reach General Behavior During Session: [facility] for tasks performed Cognition: [facility] for tasks performed  Felecia Shelling PTA [facility]  Acute  Rehab Pager     [phone]

## 2011-10-18 NOTE — Progress Notes (Signed)
Subjective No complaint, 1episode of diarrhea last night, pt transferred to med/surg floor  Objective: Vital signs in last 24 hours: Temp:  [97.8 F (36.6 C)-98.9 F (37.2 C)] 98.9 F (37.2 C) (11/19 2000) Pulse Rate:  [81-99] 81  (11/19 2130) Resp:  [15-21] 15  (11/19 2130) BP: (96-105)/(54-58) 102/58 mmHg (11/19 2000) SpO2:  [96 %-99 %] 98 % (11/19 2130) Last BM Date: 10/17/11 General:   Alert,  Well-developed, well-nourished, pleasant and cooperative in NAD Head:  Normocephalic and atraumatic. Eyes:  Sclera clear, no icterus.   Conjunctiva pink. Mouth:  No deformity or lesions, dentition normal. Neck:  Supple; no masses or thyromegaly. Heart:  Regular rate and rhythm; no murmurs, clicks, rubs,  or gallops. Abdomen: soft, non tender, positive bowl sounds, no fluid wave,  Msk:  Symmetrical without gross deformities. Normal posture. Pulses:  Normal pulses noted. Extremities:  2+ pitting edema. Neurologic:  Alert and  oriented x4;  grossly normal neurologically., no asterixis Skin:  Intact without significant lesions or rashes. Cervical Nodes:  No significant cervical adenopathy. Psych:  Alert and cooperative. Normal mood and affect.  Intake/Output from previous day: 11/19 0701 - 11/20 0700 In: 840 [P.O.:840] Out: 3850 [Urine:3850] Intake/Output this shift: Total I/O In: 120 [P.O.:120] Out: 1800 [Urine:1800]  Lab Results:  [facility] 10/16/11 1208 10/16/11 0330 10/15/11 1255  WBC 8.6 6.1 6.7  HGB 9.2* 9.1* 9.5*  HCT 25.1* 24.9* 26.2*  PLT 143* 100* 155   BMET  Basename 10/17/11 0940 10/15/11 1255  NA 128* 128*  K 4.5 5.1  CL 92* 92*  CO2 30 26  GLUCOSE 251* 303*  BUN 49* 40*  CREATININE 1.21 1.27  CALCIUM 8.2* 8.6   LFT  Basename 10/17/11 0940  PROT 4.9*  ALBUMIN 1.9*  AST 33  ALT 13  ALKPHOS 51  BILITOT 1.1  BILIDIR --  IBILI --   PT/INR  Basename 10/15/11 1255  LABPROT 20.8*  INR 1.76*   Hepatitis Panel No results found for this basename:  HEPBSAG,HCVAB,HEPAIGM,HEPBIGM in the last 72 hours    Studies/Results: Dg Chest 2 View  10/16/2011  *RADIOLOGY REPORT*  Clinical Data: Shortness of breath post hip surgery  CHEST - 2 VIEW  Comparison: 10/15/2011  Findings: Heart size upper limits normal.  Lungs are clear.  No effusion.  Regional bones unremarkable.  IMPRESSION:  Borderline cardiomegaly.  No acute disease.  Original Report Authenticated By: Osa Craver, M.D.    Assessment: Active Problems:  AODM  OBSTRUCTIVE SLEEP APNEA  Essential hypertension, benign  Cirrhosis  Coronary atherosclerosis of native coronary artery  Morbid obesity  Arthritis pain of hip  CRI (chronic renal insufficiency)  NAFLD (nonalcoholic fatty liver disease)  Anemia   Effective diuresis , positive output 3000cc's, but  Remains to have dependent edema. Awaiting B-met this am,   Plan: Pt requests reducing his lactulose dose to 30cc po tid Continue salt restricted ,carb modified diet Cont Aldactone, Lasix,  Bmet in am   LOS: 4 days   Lina Sar  10/18/2011, 6:21 AM

## 2011-10-19 LAB — GLUCOSE, CAPILLARY
Glucose-Capillary: 311 mg/dL — ABNORMAL HIGH (ref 70–99)
Glucose-Capillary: 203 mg/dL — ABNORMAL HIGH (ref 70–99)
Glucose-Capillary: 261 mg/dL — ABNORMAL HIGH (ref 70–99)

## 2011-10-19 MED ORDER — METHOCARBAMOL 500 MG PO TABS: 500.0000 mg | ORAL_TABLET | Freq: Four times a day (QID) | ORAL | Status: AC | PRN

## 2011-10-19 NOTE — Progress Notes (Signed)
Subjective: 5 Days Post-Op Procedure(s) (LRB): TOTAL HIP ARTHROPLASTY ANTERIOR APPROACH (Right) No acute changes.  Doing well overall. Working with therapy.  Objective: Vital signs in last 24 hours: Temp:  [97.9 F (36.6 C)-99 F (37.2 C)] 98.2 F (36.8 C) (11/21 0600) Pulse Rate:  [81-98] 84  (11/21 0600) Resp:  [18-20] 18  (11/21 0600) BP: (100-107)/(62-72) 107/72 mmHg (11/21 0600) SpO2:  [93 %-98 %] 95 % (11/21 0600)  Intake/Output from previous day: 11/20 0701 - 11/21 0700 In: 720 [P.O.:720] Out: 2750 [Urine:2750] Intake/Output this shift: Total I/O In: 480 [P.O.:480] Out: 1250 [Urine:1250]   Basename 10/16/11 1208  HGB 9.2*    Basename 10/16/11 1208  WBC 8.6  RBC 2.81*  HCT 25.1*  PLT 143*    Basename 10/17/11 0940  NA 128*  K 4.5  CL 92*  CO2 30  BUN 49*  CREATININE 1.21  GLUCOSE 251*  CALCIUM 8.2*   No results found for this basename: LABPT:2,INR:2 in the last 72 hours  Neurovascular intact Sensation intact distally Intact pulses distally Incision: scant drainage  Assessment/Plan: 5 Days Post-Op Procedure(s) (LRB): TOTAL HIP ARTHROPLASTY ANTERIOR APPROACH (Right) Discharge to SNF when bed available.  [name] 10/19/2011, 6:50 AM

## 2011-10-19 NOTE — Progress Notes (Signed)
Subjective Feeling fine, not enough to eat, wants to  Be transferred to rehab today  Objective: Vital signs in last 24 hours: Temp:  [97.9 F (36.6 C)-99 F (37.2 C)] 98.2 F (36.8 C) (11/21 0600) Pulse Rate:  [81-98] 84  (11/21 0600) Resp:  [18-20] 18  (11/21 0600) BP: (100-107)/(62-72) 107/72 mmHg (11/21 0600) SpO2:  [93 %-98 %] 95 % (11/21 0600) Last BM Date: 10/18/11 General:   Alert,  Well-developed, well-nourished, pleasant and cooperative in NAD Head:  Normocephalic and atraumatic. Eyes:  Sclera clear, no icterus.   Conjunctiva pink. Mouth:  No deformity or lesions, dentition normal. Neck:  Supple; no masses or thyromegaly. Lungs: few exp. wheezes Heart:  Regular rate and rhythm; no murmurs, clicks, rubs,  or gallops. Abdomen:  soft, nontender, nl B.S's, decreased distention  Msk:  Symmetrical without gross deformities. Normal posture. Pulses:  Normal pulses noted. Extremities: 2+  Edema.bilaterally, improved Neurologic:  Alert and  oriented x4;  grossly normal neurologically. Skin:  Intact without significant lesions or rashes. Cervical Nodes:  No significant cervical adenopathy. Psych:  Alert and cooperative. Normal mood and affect.  Intake/Output from previous day: 11/20 0701 - 11/21 0700 In: 720 [P.O.:720] Out: 2750 [Urine:2750] Intake/Output this shift: Total I/O In: 480 [P.O.:480] Out: 1250 [Urine:1250]  Lab Results:  [facility] 10/16/11 1208  WBC 8.6  HGB 9.2*  HCT 25.1*  PLT 143*   BMET  Basename 10/17/11 0940  NA 128*  K 4.5  CL 92*  CO2 30  GLUCOSE 251*  BUN 49*  CREATININE 1.21  CALCIUM 8.2*   LFT  Basename 10/17/11 0940  PROT 4.9*  ALBUMIN 1.9*  AST 33  ALT 13  ALKPHOS 51  BILITOT 1.1  BILIDIR --  IBILI --   PT/INR No results found for this basename: LABPROT:2,INR:2 in the last 72 hours Hepatitis Panel No results found for this basename: HEPBSAG,HCVAB,HEPAIGM,HEPBIGM in the last 72 hours   Studies/Results: No results  found.  Assessment: Active Problems:  AODM  OBSTRUCTIVE SLEEP APNEA  Essential hypertension, benign  Cirrhosis  Coronary atherosclerosis of native coronary artery  Morbid obesity  Arthritis pain of hip  CRI (chronic renal insufficiency)  NAFLD (nonalcoholic fatty liver disease)  Anemia   Weight down from 304lbs to 266 lbs yesterday, NASH/cirrhosis, stable  Plan: OK to transfer to Rehab today, Continue the same diuretic regimen B-met  At Rehab, otherwise , if at home, on Monday 10/25/2011 Monitor weights   LOS: 5 days   Lina Sar  10/19/2011, 6:44 AM

## 2011-10-19 NOTE — Discharge Summary (Signed)
Physician Discharge Summary  Patient ID: [name] MRN: [id number] DOB/AGE: [birth year] 60 y.o.  Admit date: 10/14/2011 Discharge date: 10/19/2011  Admission Diagnoses: Right hip osteoarthritis  Discharge Diagnoses:  Active Problems:  AODM  OBSTRUCTIVE SLEEP APNEA  Essential hypertension, benign  Cirrhosis  Coronary atherosclerosis of native coronary artery  Morbid obesity  Arthritis pain of hip  CRI (chronic renal insufficiency)  NAFLD (nonalcoholic fatty liver disease)  Anemia   Past Medical History  Diagnosis Date  . Hypertension   . Type II or unspecified type diabetes mellitus without mention of complication, not stated as uncontrolled   . Thyroid disease   . Cirrhosis, non-alcoholic   . Morbid obesity   . Pancytopenia   . Fatty liver   . Coronary artery disease   . Myocardial infarction     "HEART INCIDENT " OCT 2012  . Angina     RARE USE OF NTG  . CHF (congestive heart failure)     JUNE 2012  . Arthritis   . Gout 35 YRS AGO  . Cellulitis of left leg   . Cirrhosis of liver     NONALCOHOLIC  . Nocturia   . Elevated BUN   . Elevated serum creatinine   . Serum ammonia increased   . Splenomegaly   . Platelets decreased   . Thyroid disorder     PT DOES NOT KNOW IF LOW OR HIGH  . Obstructive sleep apnea     USES O2 WITH C - PAP SETTING FOR C-PA IS 16    Surgeries: Procedure(s): TOTAL HIP ARTHROPLASTY ANTERIOR APPROACH on 10/14/2011   Consultants (if any): Treatment Team:  Hart Carwin, MD  Discharged Condition: Improved  Hospital Course: [name] is an 60 y.o. male who was admitted 10/14/2011 with a diagnosis of right hip OA/DJD and went to the operating room on 10/14/2011 and underwent the above named procedures.  Marland Kitchen  He was given sequential compression devices, early ambulation, and chemoprophylaxis for DVT prophylaxis.  They benefited maximally from their hospital stay and there were no complications.    Recent vital signs:  Filed  Vitals:   10/19/11 0600  BP: 107/72  Pulse: 84  Temp: 98.2 F (36.8 C)  Resp: 18    Recent laboratory studies:  Lab Results  Component Value Date   HGB 9.2* 10/16/2011   HGB 9.1* 10/16/2011   HGB 9.5* 10/15/2011   Lab Results  Component Value Date   WBC 8.6 10/16/2011   PLT 143* 10/16/2011   Lab Results  Component Value Date   INR 1.76* 10/15/2011   Lab Results  Component Value Date   NA 128* 10/17/2011   K 4.5 10/17/2011   CL 92* 10/17/2011   CO2 30 10/17/2011   BUN 49* 10/17/2011   CREATININE 1.21 10/17/2011   GLUCOSE 251* 10/17/2011    Discharge Medications:  Will continue all same home meds as befor Current Discharge Medication List    START taking these medications   Details  methocarbamol (ROBAXIN) 500 MG tablet Take 1 tablet (500 mg total) by mouth every 6 (six) hours as needed. Qty: 30 tablet, Refills: 0      CONTINUE these medications which have NOT CHANGED   Details  aspirin 81 MG tablet Take 81 mg by mouth every morning.     furosemide (LASIX) 80 MG tablet Take 80 mg by mouth 2 (two) times daily.     insulin detemir (LEVEMIR) 100 UNIT/ML injection Inject 25 Units into the  skin at bedtime.     lactulose (CHRONULAC) 10 GM/15ML solution Take 20 g by mouth 3 (three) times daily as needed. CONSTIPATION    levothyroxine (SYNTHROID, LEVOTHROID) 50 MCG tablet Take 50 mcg by mouth every morning.     lidocaine (LIDODERM) 5 % Place 1 patch onto the skin daily as needed. Remove & Discard patch within 12 hours or as directed by MD..PAIN    metoprolol succinate (TOPROL-XL) 25 MG 24 hr tablet 12.5 mg every morning.     Multiple Vitamin (MULTIVITAMIN) tablet Take 1 tablet by mouth daily.      nitroGLYCERIN (NITROSTAT) 0.4 MG SL tablet Place 0.4 mg under the tongue every 5 (five) minutes as needed. CHEST PAIN    nystatin (MYCOSTATIN) powder Apply topically daily as needed. ITCH     oxycodone (OXY-IR) 5 MG capsule Take 5 mg by mouth every 4 (four) hours as  needed. PAIN       STOP taking these medications     spironolactone (ALDACTONE) 50 MG tablet            Disposition: D/C to SNF  Discharge Orders    Future Appointments: Provider: Department: Dept Phone: Center:   12/27/2011 9:00 AM Micheline Chapman, MD [facility] [phone] [facility]   08/22/2012 1:30 PM Barbaraann Share, MD [facility] [phone] None     Instructions: At SNF, will get up with therapy with weight-bearing as tolerated on his right hip.  He can get his incision wet in the shower.  A dry dressing should be applied daily.  He will need a BMET next Monday.  A follow-up will be at  [facility] in 2 weeks for his hip.    SignedKathryne Hitch 10/19/2011, 6:56 AM

## 2011-10-19 NOTE — Progress Notes (Signed)
Physical Therapy Treatment Patient Details Name: [name] MRN: [id number] DOB: 1951/10/31 Today's Date: 10/19/2011 15:45 - 16:40 2 ta  2 gt PT Assessment/Plan  PT - Assessment/Plan Comments on Treatment Session: pt awaiting SNF placement PT Plan: Discharge plan remains appropriate PT Frequency: 7X/week Follow Up Recommendations: Skilled nursing facility Equipment Recommended: Defer to next venue PT Goals  Acute Rehab PT Goals PT Goal Formulation: With patient Pt will go Supine/Side to Sit: with supervision PT Goal: Supine/Side to Sit - Progress: Progressing toward goal Pt will Transfer Sit to Stand/Stand to Sit: with supervision PT Transfer Goal: Sit to Stand/Stand to Sit - Progress: Progressing toward goal Pt will Ambulate: 51 - 150 feet;with supervision;with least restrictive assistive device PT Goal: Ambulate - Progress: Progressing toward goal Pt will Perform Home Exercise Program: with supervision, verbal cues required/provided PT Goal: Perform Home Exercise Program - Progress: Progressing toward goal  PT Treatment Precautions/Restrictions  Precautions Precautions: Other (comment) (Direct Anterior...Marland KitchenMarland KitchenNO PERCAUTIONS) Precaution Booklet Issued: No Precaution Comments: none Required Braces or Orthoses: No Restrictions Weight Bearing Restrictions: No RLE Weight Bearing: Weight bearing as tolerated LLE Weight Bearing: Weight bearing as tolerated Mobility (including Balance) Bed Mobility Bed Mobility: Yes Supine to Sit: 1: +1 Total assist (total assist pt 50%) Supine to Sit Details (indicate cue type and reason): HOB increased 50' and increased time with use of rails and trapeze Sitting - Scoot to Edge of Bed: 1: +1 Total assist (Total assist pt 50%) Sitting - Scoot to Edge of Bed Details (indicate cue type and reason): increased time Sit to Supine - Right: 1: +1 Total assist;1: +2 Total assist Sit to Supine - Right Details (indicate cue type and reason): Total assist  + 2 pt 30% with total assist to support B LE up into bed Transfers Transfers: Yes Sit to Stand: 2: Max assist;From bed Sit to Stand Details (indicate cue type and reason): off very elevated bed and increased time Stand to Sit: 2: Max assist;To bed Stand to Sit Details: 25% VC's on hand placement and increased time Ambulation/Gait Ambulation/Gait: Yes Ambulation/Gait Assistance: 4: Min assist Ambulation/Gait Assistance Details (indicate cue type and reason): increased time approx 33 min to amb 55 feet Ambulation Distance (Feet): 55 Feet Assistive device: Rolling walker (Beriatric RW) Gait Pattern: Step-to pattern Stairs: No Wheelchair Mobility Wheelchair Mobility: No    Exercise    End of Session PT - End of Session Equipment Utilized During Treatment: Gait belt Activity Tolerance: Patient tolerated treatment well Patient left: in bed;with call bell in reach Nurse Communication: Mobility status for transfers General Behavior During Session: [facility] for tasks performed Cognition: [facility] for tasks performed  Felecia Shelling PTA [facility]  Acute  Rehab Pager     [phone]

## 2011-10-19 NOTE — Progress Notes (Signed)
Physical Therapy Treatment Patient Details Name: [name] MRN: [id number] DOB: Feb 12, 1951 Today's Date: 10/19/2011 10:20 - 11:30 2 gt  2ta  1te PT Assessment/Plan  PT - Assessment/Plan Comments on Treatment Session: "It's getting better" Pt awaiting SNF placement @ [facility] PT Plan: Discharge plan remains appropriate PT Frequency: 7X/week Follow Up Recommendations: Skilled nursing facility Equipment Recommended: Defer to next venue PT Goals  Acute Rehab PT Goals PT Goal Formulation: With patient Pt will go Supine/Side to Sit: with supervision PT Goal: Supine/Side to Sit - Progress: Progressing toward goal Pt will Transfer Sit to Stand/Stand to Sit: with supervision PT Transfer Goal: Sit to Stand/Stand to Sit - Progress: Progressing toward goal Pt will Ambulate: 51 - 150 feet;with supervision;with least restrictive assistive device PT Goal: Ambulate - Progress: Progressing toward goal Pt will Perform Home Exercise Program: with supervision, verbal cues required/provided PT Goal: Perform Home Exercise Program - Progress: Progressing toward goal  PT Treatment Precautions/Restrictions  Precautions Precautions: Anterior Hip Precaution Booklet Issued: No Precaution Comments: none Required Braces or Orthoses: No Restrictions Weight Bearing Restrictions: No RLE Weight Bearing: Weight bearing as tolerated LLE Weight Bearing: Weight bearing as tolerated Mobility (including Balance) Bed Mobility Bed Mobility: Yes Supine to Sit: 1: +1 Total assist Supine to Sit Details (indicate cue type and reason): total Assist + 1 pt 50% with HOB increased 50' and increased time X3 Sitting - Scoot to [place] of Bed: 2: Max assist Sitting - Scoot to [place] of Bed Details (indicate cue type and reason): increased time to scoot to EOB Transfers Transfers: Yes Sit to Stand: 2: Max assist Sit to Stand Details (indicate cue type and reason): off very elevated bed Stand to Sit: 2: Max assist Stand  to Sit Details: 25% VC's on hand placement and increased time Ambulation/Gait Ambulation/Gait: Yes Ambulation/Gait Assistance: 3: Mod assist Ambulation/Gait Assistance Details (indicate cue type and reason): increased time X 3 (approx 18 min to amb 48') with 25% VC's to increase heel strike and R knee flex Ambulation Distance (Feet): 48 Feet Assistive device: Rolling walker Gait Pattern: Step-to pattern Stairs: No Wheelchair Mobility Wheelchair Mobility: No    Exercise  Total Joint Exercises Ankle Circles/Pumps: AROM;Both;10 reps Quad Sets: AROM;Both;10 reps Gluteal Sets: AROM;Both;10 reps Short Arc QuadBarbaraann Boys;Right;10 reps Heel Slides: AAROM;Right;10 reps Hip ABduction/ADduction: AAROM;Right;10 reps Straight Leg Raises: AAROM;Right;10 reps End of Session PT - End of Session Equipment Utilized During Treatment: Gait belt Activity Tolerance: Patient tolerated treatment well Patient left: in bed;with call bell in reach;with family/visitor present Nurse Communication: Mobility status for transfers General Behavior During Session: [facility] for tasks performed Cognition: [facility] for tasks performed  Felecia Shelling PTA [facility]  Acute  Rehab Pager     [phone]

## 2011-10-20 LAB — GLUCOSE, CAPILLARY: Glucose-Capillary: 163 mg/dL — ABNORMAL HIGH (ref 70–99)

## 2011-10-20 NOTE — Progress Notes (Signed)
Subjective: 6 Days Post-Op Procedure(s) (LRB): TOTAL HIP ARTHROPLASTY ANTERIOR APPROACH (Right) Patient is comfortable.    Objective: Vital signs in last 24 hours: Temp:  [98.5 F (36.9 C)-99 F (37.2 C)] 98.5 F (36.9 C) (11/22 0610) Pulse Rate:  [80-87] 80  (11/22 0610) Resp:  [12-18] 12  (11/22 0610) BP: (92-101)/(64-67) 99/66 mmHg (11/22 0610) SpO2:  [96 %-97 %] 96 % (11/22 0610)  Intake/Output from previous day: 11/21 0701 - 11/22 0700 In: 1320 [P.O.:1320] Out: 3150 [Urine:3150] Intake/Output this shift: Total I/O In: 480 [P.O.:480] Out: -   No results found for this basename: HGB:5 in the last 72 hours No results found for this basename: WBC:2,RBC:2,HCT:2,PLT:2 in the last 72 hours No results found for this basename: NA:2,K:2,CL:2,CO2:2,BUN:2,CREATININE:2,GLUCOSE:2,CALCIUM:2 in the last 72 hours No results found for this basename: LABPT:2,INR:2 in the last 72 hours  Neurologically intact  Assessment/Plan: 6 Days Post-Op Procedure(s) (LRB): TOTAL HIP ARTHROPLASTY ANTERIOR APPROACH (Right) Discharge to SNF Plan for SNF tomorrow  DUDA,MARCUS V 10/20/2011, 10:12 AM

## 2011-10-20 NOTE — Progress Notes (Signed)
Physical Therapy Treatment Patient Details Name: [name] MRN: [id number] DOB: 1951/01/25 Today's Date: 10/20/2011 10:10 - 10:50 2 gt 1 ta PT Assessment/Plan  PT - Assessment/Plan Comments on Treatment Session: "I can move better" pt plans to D/C to SNF for Rehab PT Plan: Discharge plan remains appropriate PT Frequency: 7X/week Follow Up Recommendations: Skilled nursing facility Equipment Recommended: Defer to next venue PT Goals  Acute Rehab PT Goals PT Goal Formulation: With patient Pt will go Supine/Side to Sit: with supervision PT Goal: Supine/Side to Sit - Progress: Progressing toward goal Pt will Transfer Sit to Stand/Stand to Sit: with supervision PT Transfer Goal: Sit to Stand/Stand to Sit - Progress: Progressing toward goal Pt will Ambulate: 51 - 150 feet;with supervision;with least restrictive assistive device PT Goal: Ambulate - Progress: Progressing toward goal Pt will Perform Home Exercise Program: with supervision, verbal cues required/provided PT Goal: Perform Home Exercise Program - Progress: Progressing toward goal  PT Treatment Precautions/Restrictions  Precautions Precautions: Other (comment) (Direct Anterior...Marland KitchenMarland KitchenNO PERCAUTIONS) Precaution Booklet Issued: No Precaution Comments: none Required Braces or Orthoses: No Restrictions Weight Bearing Restrictions: No RLE Weight Bearing: Weight bearing as tolerated LLE Weight Bearing: Weight bearing as tolerated Mobility (including Balance) Bed Mobility Bed Mobility: Yes Supine to Sit: 1: +1 Total assist Supine to Sit Details (indicate cue type and reason): HOB increased 50' and increased time with Max Assist to support R LE off the bed Sitting - Scoot to Edge of Bed: 1: +1 Total assist Sitting - Scoot to Edge of Bed Details (indicate cue type and reason): increased time to scoot Sit to Supine - Right: 1: +2 Total assist Sit to Supine - Right Details (indicate cue type and reason): Total Assist + 2 pt 50%  with max assist to support B LE up in the bed Transfers Transfers: Yes Sit to Stand: 2: Max assist;From elevated surface;From bed Sit to Stand Details (indicate cue type and reason): increased time and one VC on hand placement Stand to Sit: 2: Max assist;To bed Stand to Sit Details: one VC to reach back to control decend Ambulation/Gait Ambulation/Gait: Yes Ambulation/Gait Assistance: 4: Min assist Ambulation/Gait Assistance Details (indicate cue type and reason): increased time but slightly faster gait with increased ability to weight shift to R Ambulation Distance (Feet): 60 Feet Assistive device: Rolling walker Gait Pattern: Step-to pattern Gait velocity: approx 24 min to amb 60' Stairs: No Wheelchair Mobility Wheelchair Mobility: No    Exercise    End of Session PT - End of Session Equipment Utilized During Treatment: Gait belt Activity Tolerance: Patient tolerated treatment well Patient left: in bed;with call bell in reach;with family/visitor present General Behavior During Session: [facility] for tasks performed Cognition: [facility] for tasks performed  Felecia Shelling PTA [facility]  Acute  Rehab Pager     [phone]

## 2011-10-21 LAB — GLUCOSE, CAPILLARY
Glucose-Capillary: 246 mg/dL — ABNORMAL HIGH (ref 70–99)
Glucose-Capillary: 265 mg/dL — ABNORMAL HIGH (ref 70–99)

## 2011-10-21 NOTE — Progress Notes (Signed)
Physical Therapy Treatment Patient Details Name: [name] MRN: [id number] DOB: January 25, 1951 Today's Date: 10/21/2011 9:55 - 10:05 1 te PT Assessment/Plan  PT - Assessment/Plan Comments on Treatment Session: Pt plans to D/C to SNF [facility] today PT Plan: Discharge plan remains appropriate PT Frequency: 7X/week Follow Up Recommendations: Skilled nursing facility Equipment Recommended: Defer to next venue PT Goals  Acute Rehab PT Goals PT Goal Formulation: With patient Pt will go Supine/Side to Sit: with supervision PT Goal: Supine/Side to Sit - Progress: Progressing toward goal Pt will Transfer Sit to Stand/Stand to Sit: with supervision PT Transfer Goal: Sit to Stand/Stand to Sit - Progress: Progressing toward goal Pt will Ambulate: 51 - 150 feet;with least restrictive assistive device;with supervision PT Goal: Ambulate - Progress: Progressing toward goal Pt will Perform Home Exercise Program: with supervision, verbal cues required/provided PT Goal: Perform Home Exercise Program - Progress: Progressing toward goal  PT Treatment Precautions/Restrictions  Precautions Precautions:  (No percautions.........Marland KitchenDirect Anterior) Precaution Booklet Issued: No Precaution Comments: none Required Braces or Orthoses: No Restrictions Weight Bearing Restrictions: No RLE Weight Bearing: Weight bearing as tolerated LLE Weight Bearing: Weight bearing as tolerated Mobility (including Balance)      Exercise  Total Joint Exercises Ankle Circles/Pumps: AROM;Both;15 reps Quad Sets: AROM;Both;10 reps Gluteal Sets: AROM;Both;10 reps Short Arc Quad: AROM;Right;10 reps Heel Slides: AAROM;5 reps (Unable to do more 2nd pain level) End of Session PT - End of Session Activity Tolerance: Patient limited by pain Patient left: in bed General Behavior During Session: [facility] for tasks performed Cognition: [facility] for tasks performed Felecia Shelling PTA [facility]  Acute  Rehab Pager     [phone]

## 2011-10-21 NOTE — Discharge Summary (Signed)
  ADDENDUM: [name] stayed two extra days due to bed availability for SNF placement and due to the Thanksgiving holiday.  His hospital status and discharge status remain unchanged.  He is doing well on the day of discharge and can be be discharged to his SNF today.  See previous discharge summary.

## 2011-10-23 ENCOUNTER — Telehealth: Payer: Self-pay | Admitting: Internal Medicine

## 2011-10-23 NOTE — Telephone Encounter (Signed)
Pt's wife called tonight to report subtle, early changes in her husband that are concerning to her of hepatic encephalopathy. Pt recently d/c'ed from inpt hospital stay to [facility] rehab after hip replacement (POD approx 9).  She notes mild tremor, mild confusion and increased sleepiness.  Pt has been constipated, but had large BM this am, prior to that about 3 days ago.  He is on increased doses of narc pain meds after hip replacement.  Was on lactulose TID in hospital and at home, but only BID in SNF.  This was increased back to TID today.  She is concerned and called to see if we had any additional recs for Dr. Rene Paci, the SNF MD.  I called SNF and spoke to patient's RN, who says Dr. Rene Paci plans c met, CBC, and ammonia level in the am. I rec consideration of lactulose every 3 hours until his next BM, then decreasing the dose and titrating to 3 soft stools/day. RN will communicate this to MD and I asked that he or she call me back if their are questions tonight. Also, will need to exclude other causes of liver decomp manifesting as increased PSE such as infection, bleeding, etc.  Number at SNF [phone] Pt in room 306-B RN I spoke to name: Star

## 2011-10-24 ENCOUNTER — Emergency Department (HOSPITAL_COMMUNITY): Payer: 59

## 2011-10-24 ENCOUNTER — Encounter (HOSPITAL_COMMUNITY): Payer: Self-pay | Admitting: Emergency Medicine

## 2011-10-24 ENCOUNTER — Inpatient Hospital Stay (HOSPITAL_COMMUNITY)
Admission: EM | Admit: 2011-10-24 | Discharge: 2011-10-31 | DRG: 442 | Disposition: A | Discharge status: Home Health | Payer: 59 | Attending: Internal Medicine | Admitting: Internal Medicine

## 2011-10-24 DIAGNOSIS — B952 Enterococcus as the cause of diseases classified elsewhere: Secondary | ICD-10-CM | POA: Diagnosis present

## 2011-10-24 DIAGNOSIS — IMO0001 Reserved for inherently not codable concepts without codable children: Secondary | ICD-10-CM

## 2011-10-24 DIAGNOSIS — I252 Old myocardial infarction: Secondary | ICD-10-CM

## 2011-10-24 DIAGNOSIS — E039 Hypothyroidism, unspecified: Secondary | ICD-10-CM | POA: Diagnosis present

## 2011-10-24 DIAGNOSIS — E860 Dehydration: Secondary | ICD-10-CM

## 2011-10-24 DIAGNOSIS — K7460 Unspecified cirrhosis of liver: Secondary | ICD-10-CM | POA: Diagnosis present

## 2011-10-24 DIAGNOSIS — N189 Chronic kidney disease, unspecified: Secondary | ICD-10-CM

## 2011-10-24 DIAGNOSIS — G4733 Obstructive sleep apnea (adult) (pediatric): Secondary | ICD-10-CM

## 2011-10-24 DIAGNOSIS — M25559 Pain in unspecified hip: Secondary | ICD-10-CM | POA: Diagnosis present

## 2011-10-24 DIAGNOSIS — I10 Essential (primary) hypertension: Secondary | ICD-10-CM

## 2011-10-24 DIAGNOSIS — I2510 Atherosclerotic heart disease of native coronary artery without angina pectoris: Secondary | ICD-10-CM | POA: Diagnosis present

## 2011-10-24 DIAGNOSIS — N179 Acute kidney failure, unspecified: Secondary | ICD-10-CM | POA: Diagnosis present

## 2011-10-24 DIAGNOSIS — I129 Hypertensive chronic kidney disease with stage 1 through stage 4 chronic kidney disease, or unspecified chronic kidney disease: Secondary | ICD-10-CM | POA: Diagnosis present

## 2011-10-24 DIAGNOSIS — G2581 Restless legs syndrome: Secondary | ICD-10-CM | POA: Diagnosis present

## 2011-10-24 DIAGNOSIS — Z96649 Presence of unspecified artificial hip joint: Secondary | ICD-10-CM

## 2011-10-24 DIAGNOSIS — Z794 Long term (current) use of insulin: Secondary | ICD-10-CM

## 2011-10-24 DIAGNOSIS — K7682 Hepatic encephalopathy: Principal | ICD-10-CM | POA: Diagnosis present

## 2011-10-24 DIAGNOSIS — M1610 Unilateral primary osteoarthritis, unspecified hip: Secondary | ICD-10-CM

## 2011-10-24 DIAGNOSIS — K219 Gastro-esophageal reflux disease without esophagitis: Secondary | ICD-10-CM | POA: Diagnosis present

## 2011-10-24 DIAGNOSIS — K7290 Hepatic failure, unspecified without coma: Principal | ICD-10-CM | POA: Diagnosis present

## 2011-10-24 DIAGNOSIS — E871 Hypo-osmolality and hyponatremia: Secondary | ICD-10-CM | POA: Diagnosis present

## 2011-10-24 DIAGNOSIS — K760 Fatty (change of) liver, not elsewhere classified: Secondary | ICD-10-CM

## 2011-10-24 DIAGNOSIS — E785 Hyperlipidemia, unspecified: Secondary | ICD-10-CM

## 2011-10-24 DIAGNOSIS — D649 Anemia, unspecified: Secondary | ICD-10-CM

## 2011-10-24 DIAGNOSIS — E119 Type 2 diabetes mellitus without complications: Secondary | ICD-10-CM | POA: Diagnosis present

## 2011-10-24 DIAGNOSIS — N390 Urinary tract infection, site not specified: Secondary | ICD-10-CM | POA: Diagnosis present

## 2011-10-24 HISTORY — DX: Chronic kidney disease, stage 3 (moderate): N18.3

## 2011-10-24 HISTORY — DX: Chronic kidney disease, stage 3 unspecified: N18.30

## 2011-10-24 LAB — DIFFERENTIAL
Basophils Absolute: 0 10*3/uL (ref 0.0–0.1)
Basophils Relative: 0 % (ref 0–1)
Lymphocytes Relative: 15 % (ref 12–46)
Neutro Abs: 6.8 10*3/uL (ref 1.7–7.7)
Neutrophils Relative %: 77 % (ref 43–77)

## 2011-10-24 LAB — COMPREHENSIVE METABOLIC PANEL
ALT: 22 U/L (ref 0–53)
AST: 29 U/L (ref 0–37)
Albumin: 2.7 g/dL — ABNORMAL LOW (ref 3.5–5.2)
CO2: 24 mEq/L (ref 19–32)
Calcium: 10 mg/dL (ref 8.4–10.5)
Chloride: 88 mEq/L — ABNORMAL LOW (ref 96–112)
Creatinine, Ser: 1.71 mg/dL — ABNORMAL HIGH (ref 0.50–1.35)
GFR calc non Af Amer: 41 mL/min — ABNORMAL LOW (ref >90)
Sodium: 126 mEq/L — ABNORMAL LOW (ref 135–145)
Total Bilirubin: 2.5 mg/dL — ABNORMAL HIGH (ref 0.3–1.2)

## 2011-10-24 LAB — CBC
MCHC: 35.3 g/dL (ref 30.0–36.0)
Platelets: 313 10*3/uL (ref 150–400)
RDW: 15.5 % (ref 11.5–15.5)
WBC: 8.8 10*3/uL (ref 4.0–10.5)

## 2011-10-24 LAB — URINALYSIS, ROUTINE W REFLEX MICROSCOPIC
Hgb urine dipstick: NEGATIVE
Nitrite: NEGATIVE
Protein, ur: NEGATIVE mg/dL
Specific Gravity, Urine: 1.014 (ref 1.005–1.030)
Urobilinogen, UA: 1 mg/dL (ref 0.0–1.0)

## 2011-10-24 LAB — GLUCOSE, CAPILLARY: Glucose-Capillary: 199 mg/dL — ABNORMAL HIGH (ref 70–99)

## 2011-10-24 LAB — URINE MICROSCOPIC-ADD ON

## 2011-10-24 LAB — AMMONIA: Ammonia: 165 umol/L — ABNORMAL HIGH (ref 11–60)

## 2011-10-24 MED ORDER — SODIUM CHLORIDE 0.9 % IV BOLUS (SEPSIS)
500.0000 mL | Freq: Once | INTRAVENOUS | Status: AC
  Administered 2011-10-24: 500 mL via INTRAVENOUS

## 2011-10-24 MED ORDER — LACTULOSE 10 GM/15ML PO SOLN
20.0000 g | Freq: Three times a day (TID) | ORAL | Status: DC
  Administered 2011-10-24 – 2011-10-25 (×4): 20 g via ORAL
  Filled 2011-10-24 (×5): qty 30

## 2011-10-24 MED ORDER — INSULIN DETEMIR 100 UNIT/ML ~~LOC~~ SOLN
25.0000 [IU] | Freq: Every day | SUBCUTANEOUS | Status: DC
  Administered 2011-10-24: 25 [IU] via SUBCUTANEOUS
  Filled 2011-10-24: qty 3

## 2011-10-24 MED ORDER — ASPIRIN 81 MG PO TABS: 81.0000 mg | ORAL_TABLET | ORAL | Status: DC

## 2011-10-24 MED ORDER — NYSTATIN 100000 UNIT/GM EX POWD
Freq: Every day | CUTANEOUS | Status: DC | PRN
  Filled 2011-10-24: qty 15

## 2011-10-24 MED ORDER — LEVOTHYROXINE SODIUM 50 MCG PO TABS
50.0000 ug | ORAL_TABLET | ORAL | Status: DC
  Administered 2011-10-25 – 2011-10-31 (×7): 50 ug via ORAL
  Filled 2011-10-24 (×9): qty 1

## 2011-10-24 MED ORDER — CIPROFLOXACIN IN D5W 400 MG/200ML IV SOLN
400.0000 mg | INTRAVENOUS | Status: DC
  Administered 2011-10-24 – 2011-10-25 (×2): 400 mg via INTRAVENOUS
  Filled 2011-10-24 (×3): qty 200

## 2011-10-24 MED ORDER — ASPIRIN 325 MG PO TABS
325.0000 mg | ORAL_TABLET | Freq: Every day | ORAL | Status: DC
  Administered 2011-10-24: 325 mg via ORAL
  Filled 2011-10-24 (×3): qty 1

## 2011-10-24 MED ORDER — LACTULOSE 10 GM/15ML PO SOLN
20.0000 g | Freq: Once | ORAL | Status: AC
  Administered 2011-10-24: 20 g via ORAL
  Filled 2011-10-24 (×2): qty 30

## 2011-10-24 MED ORDER — ASPIRIN 81 MG PO TABS
81.0000 mg | ORAL_TABLET | Freq: Every day | ORAL | Status: DC
  Administered 2011-10-25 – 2011-10-29 (×5): 81 mg via ORAL
  Filled 2011-10-24 (×6): qty 1

## 2011-10-24 MED ORDER — SODIUM CHLORIDE 0.9 % IV SOLN
INTRAVENOUS | Status: DC
  Administered 2011-10-24 – 2011-10-25 (×2): via INTRAVENOUS

## 2011-10-24 MED ORDER — ONE-DAILY MULTI VITAMINS PO TABS
1.0000 | ORAL_TABLET | Freq: Every day | ORAL | Status: DC
  Administered 2011-10-24 – 2011-10-31 (×8): 1 via ORAL
  Filled 2011-10-24 (×8): qty 1

## 2011-10-24 MED ORDER — NITROGLYCERIN 0.4 MG SL SUBL: 0.4000 mg | SUBLINGUAL_TABLET | SUBLINGUAL | Status: DC | PRN

## 2011-10-24 MED ORDER — METOPROLOL SUCCINATE 12.5 MG HALF TABLET
12.5000 mg | ORAL_TABLET | Freq: Every day | ORAL | Status: DC
  Administered 2011-10-25 – 2011-10-31 (×7): 12.5 mg via ORAL
  Filled 2011-10-24 (×7): qty 1

## 2011-10-24 MED ORDER — BISACODYL 10 MG RE SUPP: 10.0000 mg | Freq: Once | RECTAL | Status: DC

## 2011-10-24 MED ORDER — FUROSEMIDE 80 MG PO TABS
80.0000 mg | ORAL_TABLET | Freq: Two times a day (BID) | ORAL | Status: DC
  Administered 2011-10-24 – 2011-10-25 (×2): 80 mg via ORAL
  Filled 2011-10-24 (×3): qty 1

## 2011-10-24 MED ORDER — MORPHINE SULFATE 4 MG/ML IJ SOLN
4.0000 mg | INTRAMUSCULAR | Status: AC
  Administered 2011-10-24: 4 mg via INTRAVENOUS
  Filled 2011-10-24: qty 1

## 2011-10-24 MED ORDER — LIDOCAINE 5 % EX PTCH
1.0000 | MEDICATED_PATCH | Freq: Every day | CUTANEOUS | Status: DC | PRN
  Administered 2011-10-25: 1 via TRANSDERMAL
  Filled 2011-10-24 (×2): qty 1

## 2011-10-24 MED ORDER — MORPHINE SULFATE 2 MG/ML IJ SOLN
2.0000 mg | INTRAMUSCULAR | Status: DC | PRN
  Administered 2011-10-25 (×2): 2 mg via INTRAVENOUS
  Filled 2011-10-24 (×2): qty 1

## 2011-10-24 MED ORDER — INSULIN ASPART 100 UNIT/ML ~~LOC~~ SOLN
0.0000 [IU] | Freq: Three times a day (TID) | SUBCUTANEOUS | Status: DC
  Administered 2011-10-24 – 2011-10-25 (×2): 7 [IU] via SUBCUTANEOUS
  Administered 2011-10-25: 11 [IU] via SUBCUTANEOUS
  Administered 2011-10-25: 7 [IU] via SUBCUTANEOUS
  Administered 2011-10-26: 4 [IU] via SUBCUTANEOUS
  Filled 2011-10-24: qty 3

## 2011-10-24 NOTE — ED Notes (Signed)
Pt from [place] for rehab on right hip replacement. Pt is confused per nursing facility and has not had a bowel movement.

## 2011-10-24 NOTE — ED Notes (Signed)
Pt resting comfortably at present, friend remains at bedside.  Pt resting with eyes closed.  Symmetrical rise and fall of chest noted.

## 2011-10-24 NOTE — Progress Notes (Signed)
Pt discharged to [facility] on 10-21-11 and was transported by [facility]. CSW Kathrin Penner discharged pt, CSW signed off. Patrice Paradise, LCSWA 10/24/2011 8:20 AM [phone]

## 2011-10-24 NOTE — ED Notes (Signed)
Pt care assumed, obtained verbal report.  Pt resting uncomfortably with friend at bedside.  Pt's friend states that pt's wife is requesting for something to calm pt d/t agitation.  She's made aware that pt is waiting for admission MD to come see pt and write orders.

## 2011-10-24 NOTE — ED Notes (Signed)
WGN:[id number]<HY> Expected date:10/24/11<BR> Expected time:<BR> Means of arrival:<BR> Comments:<BR> ems altered loc

## 2011-10-24 NOTE — ED Provider Notes (Cosign Needed Addendum)
History     CSN: [id number] Arrival date & time: 10/24/2011  8:29 AM   First MD Initiated Contact with Patient 10/24/11 [phone]      Chief Complaint  Patient presents with  . Altered Mental Status  . Constipation   patient with extensive past medical history including diabetes, hypertension, morbid obesity, CAD, CHF, cirrhosis of liver, nonalcoholic. Recently, he underwent right hip arthroplasty on 10/14/2011 by Dr Magnus Ivan. . Patient was discharged to a skilled nursing facility on Friday. His wife states he was not getting his normal dosage of lactulose and began demonstrating confusion and tremor. She felt that his ammonia was possibly becoming elevated, and contacted, his GI Dr. His lactulose was reinstituted. However, the patient has not had a bowel movement in several days.  Patient has had no fevers. His incision is intact. He denies chest pain, and his abdominal pain is diffuse.  (Consider location/radiation/quality/duration/timing/severity/associated sxs/prior treatment) HPI  Past Medical History  Diagnosis Date  . Hypertension   . Type II or unspecified type diabetes mellitus without mention of complication, not stated as uncontrolled   . Thyroid disease   . Cirrhosis, non-alcoholic   . Morbid obesity   . Pancytopenia   . Fatty liver   . Coronary artery disease   . Myocardial infarction     "HEART INCIDENT " OCT 2012  . Angina     RARE USE OF NTG  . CHF (congestive heart failure)     JUNE 2012  . Arthritis   . Gout 35 YRS AGO  . Cellulitis of left leg   . Cirrhosis of liver     NONALCOHOLIC  . Nocturia   . Elevated BUN   . Elevated serum creatinine   . Serum ammonia increased   . Splenomegaly   . Platelets decreased   . Thyroid disorder     PT DOES NOT KNOW IF LOW OR HIGH  . Obstructive sleep apnea     USES O2 WITH C - PAP SETTING FOR C-PA IS 16    Past Surgical History  Procedure Date  . Appendectomy   . Coronary stent placement     2 CARDIAC STENTS OCT  08/2011  . Total knee arthroplasty 10/14/2011  . Total hip arthroplasty 10/14/2011    Procedure: TOTAL HIP ARTHROPLASTY ANTERIOR APPROACH;  Surgeon: Kathryne Hitch;  Location: [facility];  Service: Orthopedics;  Laterality: Right;  . Joint replacement     Family History  Problem Relation Age of Onset  . Colon cancer Neg Hx   . Diabetes Mother   . Diabetes Father     History  Substance Use Topics  . Smoking status: Never Smoker   . Smokeless tobacco: Never Used  . Alcohol Use: No      Review of Systems  All other systems reviewed and are negative.    Allergies  Statins  Home Medications   Current Outpatient Rx  Name Route Sig Dispense Refill  . ASPIRIN 81 MG PO TABS Oral Take 81 mg by mouth every morning.     . FUROSEMIDE 80 MG PO TABS Oral Take 80 mg by mouth 2 (two) times daily.     . INSULIN DETEMIR 100 UNIT/ML [place] SOLN Subcutaneous Inject 25 Units into the skin at bedtime.     Marland Kitchen LACTULOSE 10 GM/15ML PO SOLN Oral Take 20 g by mouth 3 (three) times daily as needed. CONSTIPATION    . LEVOTHYROXINE SODIUM 50 MCG PO TABS Oral Take 50 mcg by mouth  every morning.     Marland Kitchen LIDOCAINE 5 % EX PTCH Transdermal Place 1 patch onto the skin daily as needed. Remove & Discard patch within 12 hours or as directed by MD..PAIN    . METHOCARBAMOL 500 MG PO TABS Oral Take 1 tablet (500 mg total) by mouth every 6 (six) hours as needed. 30 tablet 0  . METOPROLOL SUCCINATE 25 MG PO TB24  12.5 mg every morning.     Marland Kitchen ONE-DAILY MULTI VITAMINS PO TABS Oral Take 1 tablet by mouth daily.      Marland Kitchen NITROGLYCERIN 0.4 MG SL SUBL Sublingual Place 0.4 mg under the tongue every 5 (five) minutes as needed. CHEST PAIN    . NYSTATIN 100000 UNIT/GM EX POWD Topical Apply topically daily as needed. ITCH     . OXYCODONE HCL 5 MG PO CAPS Oral Take 5 mg by mouth every 4 (four) hours as needed. PAIN       BP 114/59  Pulse 94  Temp(Src) 97.7 F (36.5 C) (Oral)  Resp 16  SpO2 95%  Physical Exam    Constitutional: He appears well-developed and well-nourished.       Morbidly obese, mild distress, moaning, however, awake and alert enough to answer basic questions and asked for my name was  HENT:  Head: Normocephalic and atraumatic.  Eyes: Conjunctivae and EOM are normal. Pupils are equal, round, and reactive to light.  Neck: Neck supple.  Cardiovascular: Normal rate and regular rhythm.  Exam reveals no gallop and no friction rub.   No murmur heard. Pulmonary/Chest: Breath sounds normal. He has no wheezes. He has no rales. He exhibits no tenderness.  Abdominal: Soft. Bowel sounds are normal. He exhibits no distension. There is no tenderness. There is no rebound and no guarding.       Mild diffuse tenderness, bowel sounds are present. No rebound, rigidity or guarding.  Musculoskeletal: Normal range of motion.  Neurological: He is alert. No cranial nerve deficit. Coordination normal.       Awake, alert, moaning. Able to answer simple questions and follow simple commands.  Skin: Skin is warm and dry. No rash noted.  Psychiatric: He has a normal mood and affect.    ED Course  Procedures (including critical care time)  Labs Reviewed  AMMONIA - Abnormal; Notable for the following:    Ammonia 165 (*)    All other components within normal limits  CBC - Abnormal; Notable for the following:    RBC 3.73 (*)    Hemoglobin 12.0 (*)    HCT 34.0 (*)    All other components within normal limits  COMPREHENSIVE METABOLIC PANEL - Abnormal; Notable for the following:    Sodium 126 (*)    Chloride 88 (*)    Glucose, Bld 278 (*)    BUN 67 (*)    Creatinine, Ser 1.71 (*)    Albumin 2.7 (*)    Total Bilirubin 2.5 (*)    GFR calc non Af Amer 41 (*)    GFR calc Af Amer 48 (*)    All other components within normal limits  DIFFERENTIAL  URINALYSIS, ROUTINE W REFLEX MICROSCOPIC   Dg Abd Acute W/chest  10/24/2011  *RADIOLOGY REPORT*  Clinical Data: Abdominal pain, altered mental status  ACUTE  ABDOMEN SERIES (ABDOMEN 2 VIEW & CHEST 1 VIEW)  Comparison: 10/16/2011, 05/28/2011  Findings: Decreased lung volumes noted.  Negative for CHF, pneumonia, effusion or pneumothorax.  Stable heart size and vascularity.  No free air.  Scattered  air and stool throughout the bowel.  Nonspecific air- fluid levels on the decubitus view but negative for free air. Right hip arthroplasty noted.  IMPRESSION: No acute finding by plain radiography.  Original Report Authenticated By: Judie Petit. Ruel Favors, M.D.     No diagnosis found.    MDM  Pt is seen and examined;  Initial history and physical completed.  Will follow.   Previous chart and records have been reviewed thoroughly.    Patient Information       Patient Name Sex DOB SSN    [name], [name] Male [birth year] [id number]       D/C Summaries signed by Kathryne Hitch at 10/19/11 0700     Author: Kathryne Hitch Service: (none) Author Type: Physician    Filed: 10/19/11 0700 Note Time: 10/19/11 0655              Physician Discharge Summary    Patient ID: [name] MRN: [id number] DOB/AGE: December 15, 1950 60 y.o.   Admit date: 10/14/2011 Discharge date: 10/19/2011   Admission Diagnoses: Right hip osteoarthritis   Discharge Diagnoses:  Active Problems:  AODM  OBSTRUCTIVE SLEEP APNEA  Essential hypertension, benign  Cirrhosis  Coronary atherosclerosis of native coronary artery  Morbid obesity  Arthritis pain of hip  CRI (chronic renal insufficiency)  NAFLD (nonalcoholic fatty liver disease)  Anemia      Past Medical History   Diagnosis  Date   .  Hypertension     .  Type II or unspecified type diabetes mellitus without mention of complication, not stated as uncontrolled     .  Thyroid disease     .  Cirrhosis, non-alcoholic     .  Morbid obesity     .  Pancytopenia     .  Fatty liver     .  Coronary artery disease     .  Myocardial infarction         "HEART INCIDENT " OCT 2012   .  Angina         RARE USE OF  NTG   .  CHF (congestive heart failure)         JUNE 2012   .  Arthritis     .  Gout  35 YRS AGO   .  Cellulitis of left leg     .  Cirrhosis of liver         NONALCOHOLIC   .  Nocturia     .  Elevated BUN     .  Elevated serum creatinine     .  Serum ammonia increased     .  Splenomegaly     .  Platelets decreased     .  Thyroid disorder         PT DOES NOT KNOW IF LOW OR HIGH   .  Obstructive sleep apnea         USES O2 WITH C - PAP SETTING FOR C-PA IS 16        Surgeries: Procedure(s): TOTAL HIP ARTHROPLASTY ANTERIOR APPROACH on 10/14/2011   Consultants (if any): Treatment Team:  Hart Carwin, MD   Discharged Condition: Improved   Hospital Course: [name] is an 60 y.o. male who was admitted 10/14/2011 with a diagnosis of right hip OA/DJD and went to the operating room on 10/14/2011 and underwent the above named procedures.  Marland Kitchen   He was given sequential compression devices, early ambulation, and chemoprophylaxis for  DVT prophylaxis.   They benefited maximally from their hospital stay and there were no complications.     Recent vital signs:  Filed Vitals:     10/19/11 0600   BP:  107/72   Pulse:  84   Temp:  98.2 F (36.8 C)   Resp:  18        Recent laboratory studies:   Lab Results   Component  Value  Date     HGB  9.2*  10/16/2011     HGB  9.1*  10/16/2011     HGB  9.5*  10/15/2011       Lab Results   Component  Value  Date     WBC  8.6  10/16/2011     PLT  143*  10/16/2011       Lab Results   Component  Value  Date     INR  1.76*  10/15/2011       Lab Results   Component  Value  Date     NA  128*  10/17/2011     K  4.5  10/17/2011     CL  92*  10/17/2011     CO2  30  10/17/2011     BUN  49*  10/17/2011     CREATININE  1.21  10/17/2011     GLUCOSE  251*  10/17/2011        Discharge Medications:  Will continue all same home meds as befor Current Discharge Medication List      START taking these medications     Details    methocarbamol (ROBAXIN) 500 MG tablet  Take 1 tablet (500 mg total) by mouth every 6 (six) hours as needed.  Qty: 30 tablet, Refills: 0         CONTINUE these medications which have NOT CHANGED     Details   aspirin 81 MG tablet  Take 81 mg by mouth every morning.       furosemide (LASIX) 80 MG tablet  Take 80 mg by mouth 2 (two) times daily.       insulin detemir (LEVEMIR) 100 UNIT/ML injection  Inject 25 Units into the skin at bedtime.       lactulose (CHRONULAC) 10 GM/15ML solution  Take 20 g by mouth 3 (three) times daily as needed. CONSTIPATION      levothyroxine (SYNTHROID, LEVOTHROID) 50 MCG tablet  Take 50 mcg by mouth every morning.       lidocaine (LIDODERM) 5 %  Place 1 patch onto the skin daily as needed. Remove & Discard patch within 12 hours or as directed by MD..PAIN      metoprolol succinate (TOPROL-XL) 25 MG 24 hr tablet  12.5 mg every morning.       Multiple Vitamin (MULTIVITAMIN) tablet  Take 1 tablet by mouth daily.        nitroGLYCERIN (NITROSTAT) 0.4 MG SL tablet  Place 0.4 mg under the tongue every 5 (five) minutes as needed. CHEST PAIN      nystatin (MYCOSTATIN) powder  Apply topically daily as needed. ITCH        oxycodone (OXY-IR) 5 MG capsule  Take 5 mg by mouth every 4 (four) hours as needed. PAIN           STOP taking these medications        spironolactone (ALDACTONE) 50 MG tablet  Disposition: D/C to SNF    Discharge Orders      Future Appointments:  Provider:  Department:  Dept Phone:  Center:     12/27/2011 9:00 AM  Micheline Chapman, MD  [facility]  [phone]  [facility]     08/22/2012 1:30 PM  Barbaraann Share, MD  [facility]  [phone]  None         Instructions: At SNF, will get up with therapy with weight-bearing as tolerated on his right hip.  He can get his incision wet in the shower.  A dry dressing should be applied daily.  He will need a BMET next Monday.  A follow-up will be at  [facility] in 2 weeks for his hip.       SignedKathryne Hitch 10/19/2011, 6:56 AM                  Lorelle Gibbs. Patrica Duel, MD 10/24/11 [phone]   Results for orders placed during the hospital encounter of 10/24/11  AMMONIA      Component Value Range   Ammonia 165 (*) 11 - 60 (umol/L)  CBC      Component Value Range   WBC 8.8  4.0 - 10.5 (K/uL)   RBC 3.73 (*) 4.22 - 5.81 (MIL/uL)   Hemoglobin 12.0 (*) 13.0 - 17.0 (g/dL)   HCT 46.9 (*) 62.9 - 52.0 (%)   MCV 91.2  78.0 - 100.0 (fL)   MCH 32.2  26.0 - 34.0 (pg)   MCHC 35.3  30.0 - 36.0 (g/dL)   RDW 52.8  41.3 - 24.4 (%)   Platelets 313  150 - 400 (K/uL)  DIFFERENTIAL      Component Value Range   Neutrophils Relative 77  43 - 77 (%)   Neutro Abs 6.8  1.7 - 7.7 (K/uL)   Lymphocytes Relative 15  12 - 46 (%)   Lymphs Abs 1.4  0.7 - 4.0 (K/uL)   Monocytes Relative 7  3 - 12 (%)   Monocytes Absolute 0.6  0.1 - 1.0 (K/uL)   Eosinophils Relative 0  0 - 5 (%)   Eosinophils Absolute 0.0  0.0 - 0.7 (K/uL)   Basophils Relative 0  0 - 1 (%)   Basophils Absolute 0.0  0.0 - 0.1 (K/uL)  COMPREHENSIVE METABOLIC PANEL      Component Value Range   Sodium 126 (*) 135 - 145 (mEq/L)   Potassium 4.9  3.5 - 5.1 (mEq/L)   Chloride 88 (*) 96 - 112 (mEq/L)   CO2 24  19 - 32 (mEq/L)   Glucose, Bld 278 (*) 70 - 99 (mg/dL)   BUN 67 (*) 6 - 23 (mg/dL)   Creatinine, Ser 0.10 (*) 0.50 - 1.35 (mg/dL)   Calcium 27.2  8.4 - 10.5 (mg/dL)   Total Protein 7.0  6.0 - 8.3 (g/dL)   Albumin 2.7 (*) 3.5 - 5.2 (g/dL)   AST 29  0 - 37 (U/L)   ALT 22  0 - 53 (U/L)   Alkaline Phosphatase 107  39 - 117 (U/L)   Total Bilirubin 2.5 (*) 0.3 - 1.2 (mg/dL)   GFR calc non Af Amer 41 (*) >90 (mL/min)   GFR calc Af Amer 48 (*) >90 (mL/min)   Dg Chest 2 View  10/16/2011  *RADIOLOGY REPORT*  Clinical Data: Shortness of breath post hip surgery  CHEST - 2 VIEW  Comparison: 10/15/2011  Findings: Heart size upper limits normal.  Lungs are clear.  No  effusion.  Regional bones unremarkable.  IMPRESSION:  Borderline cardiomegaly.  No acute disease.  Original Report Authenticated By: Osa Craver, M.D.   Dg Chest 2 View  10/07/2011  *RADIOLOGY REPORT*  Clinical Data: Preoperative evaluation.  CHEST - 2 VIEW  Comparison: 08/28/2011.  Findings: There is moderate enlargement cardiac silhouette.  Lungs are free of infiltrates.  No pleural effusions are seen.  Bones appear average for age.  There is minimal degenerative spondylosis.  IMPRESSION: No acute or active cardiopulmonary or pleural abnormalities are seen.  Original Report Authenticated By: Crawford Givens, M.D.   Dg Hip Complete Right  10/14/2011  *RADIOLOGY REPORT*  Clinical Data: Severe osteoarthritis of the right hip.  RIGHT HIP - COMPLETE 2+ VIEW  Comparison: Radiographs dated 11/28/2010  Findings: AP C-arm images demonstrate the patient has undergone a right total hip prosthesis insertion.  The prosthesis appears in excellent position in the AP projection.  No fractures.  IMPRESSION: Satisfactory appearance of the right hip after right total hip prosthesis insertion.  Original Report Authenticated By: Gwynn Burly, M.D.   Dg Pelvis Portable  10/14/2011  *RADIOLOGY REPORT*  Clinical Data: Severe osteoarthritis of the right hip.  Right total hip replacement.  PORTABLE PELVIS  Comparison: CT scan dated 05/28/2011  Findings: Right hip prosthesis appears in good position in the AP projection.  No fracture.  Normal appearing left hip.  IMPRESSION: Satisfactory appearance of the right hip after total hip replacement.  Original Report Authenticated By: Gwynn Burly, M.D.   Dg Chest Port 1 View  10/15/2011  *RADIOLOGY REPORT*  Clinical Data: Congestive heart failure.  Coronary artery disease. Postoperative for hip replacement.  PORTABLE CHEST - 1 VIEW  Comparison: 10/07/2011  Findings: Low lung volumes are present, causing crowding of the pulmonary vasculature.  Cardiac and mediastinal  contours appear unremarkable.  The lungs appear clear.  No pleural effusion is observed.  IMPRESSION:  1.  No specific radiographic abnormality is observed.  Original Report Authenticated By: Dellia Cloud, M.D.   Dg Hip Portable 1 View Right  10/14/2011  *RADIOLOGY REPORT*  Clinical Data: Osteoarthritis of the right hip.  PORTABLE RIGHT HIP - 1 VIEW  Comparison: Radiographs dated 10/14/2011  Findings: Lateral portable view of the right hip demonstrates the acetabular and femoral components of the total hip prosthesis to be in excellent position.  IMPRESSION: Satisfactory appearance in the lateral view of the right hip after total hip replacement.  Original Report Authenticated By: Gwynn Burly, M.D.   Dg C-arm 61-120 Min-no Report  10/14/2011  CLINICAL DATA: severe osteoarthritis right hip   C-ARM 61-120 MINUTES  Fluoroscopy was utilized by the requesting physician.  No radiographic  interpretation.          Danine Hor A. Patrica Duel, MD 10/24/11 0865

## 2011-10-24 NOTE — H&P (Signed)
ALIJAH [name] is an 60 y.o. male.   Chief Complaint: Altered mental status HPI: This is a history and physical on [name] dictating physician Dr. Arne Cleveland primary care physician Dr. Renato Gails. History of present illness [name] is a 60 year old Caucasian male who was recently discharged on November 16. Following hip replacement. He was discharged to a nursing home for recovery and physical therapy and was doing okay up until Sunday when he became confused. Wife called his gastroenterologist, who increased his lactulose, but his confusion got worse and he was transferred to [facility] for further evaluation. Patient is unable to give history. History is obtained from ER chart and speaking with the wife over the phone. He apparently had a similar bout in June where he became confused and had the worsening of his hepatorenal syndrome. However, he improved from that and had got along okay then in October had a heart attack and had 3 stents placed 6 weeks post. The stents placement. He had his head surgery after being cleared by his cardiologist. [name] has multiple medical problems including nonalcoholic cirrhosis, chronic renal insufficiency, morbid obesity, diabetes, coronary artery disease, hypertension, arthritis, and gout  Past Medical History  Diagnosis Date  . Hypertension   . Type II or unspecified type diabetes mellitus without mention of complication, not stated as uncontrolled   . Thyroid disease   . Cirrhosis, non-alcoholic   . Morbid obesity   . Pancytopenia   . Fatty liver   . Coronary artery disease   . Myocardial infarction     "HEART INCIDENT " OCT 2012  . Angina     RARE USE OF NTG  . CHF (congestive heart failure)     JUNE 2012  . Arthritis   . Gout 35 YRS AGO  . Cellulitis of left leg   . Cirrhosis of liver     NONALCOHOLIC  . Nocturia   . Elevated BUN   . Elevated serum creatinine   . Serum ammonia increased   . Splenomegaly   . Platelets decreased   .  Thyroid disorder     PT DOES NOT KNOW IF LOW OR HIGH  . Obstructive sleep apnea     USES O2 WITH C - PAP SETTING FOR C-PA IS 16    Past Surgical History  Procedure Date  . Appendectomy   . Coronary stent placement     2 CARDIAC STENTS OCT 08/2011  . Total knee arthroplasty 10/14/2011  . Total hip arthroplasty 10/14/2011    Procedure: TOTAL HIP ARTHROPLASTY ANTERIOR APPROACH;  Surgeon: Kathryne Hitch;  Location: [facility];  Service: Orthopedics;  Laterality: Right;  . Joint replacement     Family History  Problem Relation Age of Onset  . Colon cancer Neg Hx   . Diabetes Mother   . Diabetes Father    Social History:  reports that he has never smoked. He has never used smokeless tobacco. He reports that he does not drink alcohol or use illicit drugs.  Allergies:  Allergies  Allergen Reactions  . Statins Other (See Comments)    PT DOES NOT REMEMBER    Medications Prior to Admission  Medication Dose Route Frequency Provider Last Rate Last Dose  . bisacodyl (DULCOLAX) suppository 10 mg  10 mg Rectal Once Peter A. Tucich, MD      . lactulose (CHRONULAC) 10 GM/15ML solution 20 g  20 g Oral Once Peter A. Patrica Duel, MD      . sodium  chloride 0.9 % bolus 500 mL  500 mL Intravenous Once Peter A. Patrica Duel, MD       Medications Prior to Admission  Medication Sig Dispense Refill  . aspirin 81 MG tablet Take 81 mg by mouth every morning.       . furosemide (LASIX) 80 MG tablet Take 80 mg by mouth 2 (two) times daily.       . insulin detemir (LEVEMIR) 100 UNIT/ML injection Inject 25 Units into the skin at bedtime.       Marland Kitchen lactulose (CHRONULAC) 10 GM/15ML solution Take 20 g by mouth 3 (three) times daily as needed. CONSTIPATION      . levothyroxine (SYNTHROID, LEVOTHROID) 50 MCG tablet Take 50 mcg by mouth every morning.       . lidocaine (LIDODERM) 5 % Place 1 patch onto the skin daily as needed. Remove & Discard patch within 12 hours or as directed by MD..PAIN      . methocarbamol  (ROBAXIN) 500 MG tablet Take 1 tablet (500 mg total) by mouth every 6 (six) hours as needed.  30 tablet  0  . metoprolol succinate (TOPROL-XL) 25 MG 24 hr tablet 12.5 mg every morning.       . Multiple Vitamin (MULTIVITAMIN) tablet Take 1 tablet by mouth daily.        . nitroGLYCERIN (NITROSTAT) 0.4 MG SL tablet Place 0.4 mg under the tongue every 5 (five) minutes as needed. CHEST PAIN      . nystatin (MYCOSTATIN) powder Apply topically daily as needed. ITCH       . oxycodone (OXY-IR) 5 MG capsule Take 5 mg by mouth every 4 (four) hours as needed. PAIN         Results for orders placed during the hospital encounter of 10/24/11 (from the past 48 hour(s))  AMMONIA     Status: Abnormal   Collection Time   10/24/11  9:25 AM      Component Value Range Comment   Ammonia 165 (*) 11 - 60 (umol/L)   CBC     Status: Abnormal   Collection Time   10/24/11  9:25 AM      Component Value Range Comment   WBC 8.8  4.0 - 10.5 (K/uL)    RBC 3.73 (*) 4.22 - 5.81 (MIL/uL)    Hemoglobin 12.0 (*) 13.0 - 17.0 (g/dL)    HCT 78.2 (*) 95.6 - 52.0 (%)    MCV 91.2  78.0 - 100.0 (fL)    MCH 32.2  26.0 - 34.0 (pg)    MCHC 35.3  30.0 - 36.0 (g/dL)    RDW 21.3  08.6 - 57.8 (%)    Platelets 313  150 - 400 (K/uL)   DIFFERENTIAL     Status: Normal   Collection Time   10/24/11  9:25 AM      Component Value Range Comment   Neutrophils Relative 77  43 - 77 (%)    Neutro Abs 6.8  1.7 - 7.7 (K/uL)    Lymphocytes Relative 15  12 - 46 (%)    Lymphs Abs 1.4  0.7 - 4.0 (K/uL)    Monocytes Relative 7  3 - 12 (%)    Monocytes Absolute 0.6  0.1 - 1.0 (K/uL)    Eosinophils Relative 0  0 - 5 (%)    Eosinophils Absolute 0.0  0.0 - 0.7 (K/uL)    Basophils Relative 0  0 - 1 (%)    Basophils Absolute 0.0  0.0 -  0.1 (K/uL)   COMPREHENSIVE METABOLIC PANEL     Status: Abnormal   Collection Time   10/24/11  9:25 AM      Component Value Range Comment   Sodium 126 (*) 135 - 145 (mEq/L)    Potassium 4.9  3.5 - 5.1 (mEq/L)     Chloride 88 (*) 96 - 112 (mEq/L)    CO2 24  19 - 32 (mEq/L)    Glucose, Bld 278 (*) 70 - 99 (mg/dL)    BUN 67 (*) 6 - 23 (mg/dL)    Creatinine, Ser 6.57 (*) 0.50 - 1.35 (mg/dL)    Calcium 84.6  8.4 - 10.5 (mg/dL)    Total Protein 7.0  6.0 - 8.3 (g/dL)    Albumin 2.7 (*) 3.5 - 5.2 (g/dL)    AST 29  0 - 37 (U/L)    ALT 22  0 - 53 (U/L)    Alkaline Phosphatase 107  39 - 117 (U/L)    Total Bilirubin 2.5 (*) 0.3 - 1.2 (mg/dL)    GFR calc non Af Amer 41 (*) >90 (mL/min)    GFR calc Af Amer 48 (*) >90 (mL/min)   URINALYSIS, ROUTINE W REFLEX MICROSCOPIC     Status: Abnormal   Collection Time   10/24/11 11:40 AM      Component Value Range Comment   Color, Urine YELLOW  YELLOW     Appearance CLEAR  CLEAR     Specific Gravity, Urine 1.014  1.005 - 1.030     pH 6.5  5.0 - 8.0     Glucose, UA NEGATIVE  NEGATIVE (mg/dL)    Hgb urine dipstick NEGATIVE  NEGATIVE     Bilirubin Urine NEGATIVE  NEGATIVE     Ketones, ur NEGATIVE  NEGATIVE (mg/dL)    Protein, ur NEGATIVE  NEGATIVE (mg/dL)    Urobilinogen, UA 1.0  0.0 - 1.0 (mg/dL)    Nitrite NEGATIVE  NEGATIVE     Leukocytes, UA TRACE (*) NEGATIVE    URINE MICROSCOPIC-ADD ON     Status: Abnormal   Collection Time   10/24/11 11:40 AM      Component Value Range Comment   Squamous Epithelial / LPF FEW (*) RARE     WBC, UA 3-6  <3 (WBC/hpf)    Bacteria, UA MANY (*) RARE     Dg Abd Acute W/chest  10/24/2011  *RADIOLOGY REPORT*  Clinical Data: Abdominal pain, altered mental status  ACUTE ABDOMEN SERIES (ABDOMEN 2 VIEW & CHEST 1 VIEW)  Comparison: 10/16/2011, 05/28/2011  Findings: Decreased lung volumes noted.  Negative for CHF, pneumonia, effusion or pneumothorax.  Stable heart size and vascularity.  No free air.  Scattered air and stool throughout the bowel.  Nonspecific air- fluid levels on the decubitus view but negative for free air. Right hip arthroplasty noted.  IMPRESSION: No acute finding by plain radiography.  Original Report Authenticated By:  Judie Petit. Ruel Favors, M.D.    Review of Systems  Unable to perform ROS: mental status change    Blood pressure 124/89, pulse 106, temperature 97.7 F (36.5 C), temperature source Oral, resp. rate 20, SpO2 98.00%. Physical Exam  Constitutional:       Morbidly obese awake but confuse. Keep repeating a series of numbers.  HENT:  Head: Normocephalic and atraumatic.  Right Ear: External ear normal.  Left Ear: External ear normal.  Nose: Nose normal.  Mouth/Throat: Oropharynx is clear and moist. No oropharyngeal exudate.  Eyes: Conjunctivae and EOM are normal. Pupils  are equal, round, and reactive to light.  Neck: Normal range of motion. Neck supple. No JVD present. No tracheal deviation present. No thyromegaly present.  Cardiovascular: Regular rhythm.  Exam reveals no friction rub.   Murmur heard. Respiratory: Effort normal and breath sounds normal. No respiratory distress. He has no wheezes. He has no rales.  GI: Soft. Bowel sounds are normal. He exhibits no distension. There is no tenderness. There is no rebound and no guarding.  Genitourinary: Penis normal.       Rectal exam deferred secondary to mental status.  Musculoskeletal: Normal range of motion. He exhibits edema. He exhibits no tenderness.  Neurological: He is alert. Coordination abnormal.       Confuse keeps repeating a series of numbers and trying to get out of bed.  Skin: Skin is warm and dry. He is not diaphoretic. No erythema.  Psychiatric:       Altered mental status     Assessment/Plan Hepatic encephalopathy Acute on chronic renal insufficiency Hyponatremia UTI Non alcoholic Cirrhosis CAD S/P recent MI and S/P recent stents Morbid obesity S/P Hip Replacement  Plan: Admit to Telemetry [facility] team 4 Increase Lactulose dose Med Reconciliation Correct electrolytes Glycemic control     Obie Kallenbach 10/24/2011, 12:41 PM

## 2011-10-25 DIAGNOSIS — K7460 Unspecified cirrhosis of liver: Secondary | ICD-10-CM

## 2011-10-25 DIAGNOSIS — K5900 Constipation, unspecified: Secondary | ICD-10-CM

## 2011-10-25 DIAGNOSIS — K7290 Hepatic failure, unspecified without coma: Secondary | ICD-10-CM

## 2011-10-25 LAB — CBC
HCT: 32.4 % — ABNORMAL LOW (ref 39.0–52.0)
Hemoglobin: 11.2 g/dL — ABNORMAL LOW (ref 13.0–17.0)
MCV: 91.5 fL (ref 78.0–100.0)
RDW: 15.4 % (ref 11.5–15.5)
WBC: 9.6 10*3/uL (ref 4.0–10.5)

## 2011-10-25 LAB — GLUCOSE, CAPILLARY
Glucose-Capillary: 244 mg/dL — ABNORMAL HIGH (ref 70–99)
Glucose-Capillary: 260 mg/dL — ABNORMAL HIGH (ref 70–99)
Glucose-Capillary: 220 mg/dL — ABNORMAL HIGH (ref 70–99)

## 2011-10-25 LAB — COMPREHENSIVE METABOLIC PANEL
ALT: 22 U/L (ref 0–53)
Albumin: 2.7 g/dL — ABNORMAL LOW (ref 3.5–5.2)
Alkaline Phosphatase: 103 U/L (ref 39–117)
Potassium: 4.5 mEq/L (ref 3.5–5.1)
Sodium: 130 mEq/L — ABNORMAL LOW (ref 135–145)
Total Protein: 6.5 g/dL (ref 6.0–8.3)

## 2011-10-25 MED ORDER — LACTULOSE 10 GM/15ML PO SOLN
30.0000 g | Freq: Four times a day (QID) | ORAL | Status: DC
  Administered 2011-10-25 (×2): 30 g via ORAL
  Filled 2011-10-25 (×6): qty 45

## 2011-10-25 MED ORDER — RIFAXIMIN 550 MG PO TABS
550.0000 mg | ORAL_TABLET | Freq: Two times a day (BID) | ORAL | Status: DC
  Administered 2011-10-25 – 2011-10-31 (×12): 550 mg via ORAL
  Filled 2011-10-25 (×14): qty 1

## 2011-10-25 MED ORDER — INSULIN DETEMIR 100 UNIT/ML ~~LOC~~ SOLN
35.0000 [IU] | Freq: Every day | SUBCUTANEOUS | Status: DC
  Administered 2011-10-25: 35 [IU] via SUBCUTANEOUS

## 2011-10-25 MED ORDER — DIPHENHYDRAMINE HCL 50 MG/ML IJ SOLN
25.0000 mg | Freq: Four times a day (QID) | INTRAMUSCULAR | Status: DC | PRN
  Administered 2011-10-25: 25 mg via INTRAVENOUS
  Administered 2011-10-26: 23:00:00 via INTRAVENOUS
  Administered 2011-10-27: 50 mg via INTRAVENOUS
  Administered 2011-10-28 – 2011-10-29 (×2): 25 mg via INTRAVENOUS
  Filled 2011-10-25 (×5): qty 1

## 2011-10-25 MED ORDER — OXYCODONE HCL 5 MG PO TABS
5.0000 mg | ORAL_TABLET | ORAL | Status: DC | PRN
  Administered 2011-10-26 – 2011-10-31 (×12): 5 mg via ORAL
  Filled 2011-10-25 (×12): qty 1

## 2011-10-25 MED ORDER — MORPHINE SULFATE 2 MG/ML IJ SOLN
2.0000 mg | INTRAMUSCULAR | Status: DC | PRN
  Administered 2011-10-25 – 2011-10-30 (×10): 2 mg via INTRAVENOUS
  Filled 2011-10-25 (×10): qty 1

## 2011-10-25 MED ORDER — FUROSEMIDE 40 MG PO TABS
40.0000 mg | ORAL_TABLET | Freq: Two times a day (BID) | ORAL | Status: DC
  Administered 2011-10-25 – 2011-10-31 (×12): 40 mg via ORAL
  Filled 2011-10-25 (×13): qty 1

## 2011-10-25 NOTE — Progress Notes (Signed)
Inpatient Diabetes Program Recommendations  AACE/ADA: New Consensus Statement on Inpatient Glycemic Control (2009)  Target Ranges:  Prepandial:   less than 140 mg/dL      Peak postprandial:   less than 180 mg/dL (1-2 hours)      Critically ill patients:  140 - 180 mg/dL   CBGs today: 098/ 119 mg/dl  Inpatient Diabetes Program Recommendations Insulin - Basal: Please increase Levemir to 30 units QHS (if fasting sugars stay elevated)

## 2011-10-25 NOTE — Plan of Care (Signed)
Problem: Phase I Progression Outcomes Goal: OOB as tolerated unless otherwise ordered Outcome: Not Applicable Date Met:  10/25/11 Strict bedrest

## 2011-10-25 NOTE — Progress Notes (Signed)
UR CHART REVIEWED 

## 2011-10-25 NOTE — Consult Note (Signed)
Referring Provider: Dr. Darnelle Catalan Primary Care Physician:  [name]., DO Primary Gastroenterologist:  Dr. Christella Hartigan  Reason for Consultation: Hepatic Encephalopathy  HPI: [name] is a 60 y.o. male to Dr. Christella Hartigan with history of [name] with cirrhosis. This has been complicated by anasarca, coagulopathy, and encephalopathy. He also has multiple other medical problems including coronary artery disease for which she is status post MI and stent placement this fall. He also has congestive heart failure. He underwent hip replacement about 2 weeks ago and was hospitalized here was a long period. He did have some problems with congestive heart failure during that admission and also was diuresed about 20 pounds. He has been at rehabilitation at [facility]. He had been taking Chronulac 30 cc 3 times daily prior to this last admission but was discharged to the nursing home on twice a day Chronulac. According to his wife he has been somewhat constipated probably from pain medications and had not had a bowel movement for a couple of days. He started becoming somewhat confused on the 25th, and has progressed significantly over the next 24 hours. He was admitted last night confused , and somnolent. His lactulose has been increased to 3 times daily, and he has also been found to have a UTI. Has been started on Cipro and cultures are pending Hemoglobin is stable over the past 10 days and chest x-ray shows no evidence of infection. This afternoon he is more alert and is speaking in sentences ,some of which are garbled. He notes he is confused and is frustrated.   Past Medical History  Diagnosis Date  . Hypertension   . Type II or unspecified type diabetes mellitus without mention of complication, not stated as uncontrolled   . Thyroid disease   . Cirrhosis, non-alcoholic   . Morbid obesity   . Pancytopenia   . Fatty liver   . Coronary artery disease   . Myocardial infarction     "HEART INCIDENT "  OCT 2012  . Angina     RARE USE OF NTG  . CHF (congestive heart failure)     JUNE 2012  . Arthritis   . Gout 35 YRS AGO  . Cellulitis of left leg   . Cirrhosis of liver     NONALCOHOLIC  . Nocturia   . Elevated BUN   . Elevated serum creatinine   . Serum ammonia increased   . Splenomegaly   . Platelets decreased   . Thyroid disorder     PT DOES NOT KNOW IF LOW OR HIGH  . Obstructive sleep apnea     USES O2 WITH C - PAP SETTING FOR C-PA IS 16  . Chronic kidney disease (CKD), stage III (moderate)     Past Surgical History  Procedure Date  . Coronary stent placement     2 CARDIAC STENTS OCT 08/2011  . Total knee arthroplasty 10/14/2011  . Total hip arthroplasty 10/14/2011    Procedure: TOTAL HIP ARTHROPLASTY ANTERIOR APPROACH;  Surgeon: Kathryne Hitch;  Location: [facility];  Service: Orthopedics;  Laterality: Right;  . Joint replacement   . Appendectomy 1968  . Tonsillectomy 1957  . Cardiac catheterization 09/08/2011    Prior to Admission medications   Medication Sig Start Date End Date Taking? Authorizing Provider  aspirin 81 MG tablet Take 81 mg by mouth every morning.    Yes Historical Provider, MD  docusate sodium (COLACE) 100 MG capsule Take 100 mg by mouth 2 (two) times daily.  Yes Historical Provider, MD  furosemide (LASIX) 80 MG tablet Take 80 mg by mouth 2 (two) times daily.  06/02/11  Yes Historical Provider, MD  insulin detemir (LEVEMIR) 100 UNIT/ML injection Inject 25 Units into the skin at bedtime.     Yes Historical Provider, MD  lactulose (CHRONULAC) 10 GM/15ML solution Take 20 g by mouth every 3 (three) hours. Until bowel movement, once patient has bowel movement three times daily thereafter    Yes Historical Provider, MD  levothyroxine (SYNTHROID, LEVOTHROID) 50 MCG tablet Take 50 mcg by mouth every morning.    Yes Historical Provider, MD  methocarbamol (ROBAXIN) 500 MG tablet Take 1 tablet (500 mg total) by mouth every 6 (six) hours as needed. 10/19/11  10/29/11 Yes Kathryne Hitch  metoprolol succinate (TOPROL-XL) 25 MG 24 hr tablet 12.5 mg every morning.    Yes Historical Provider, MD  Multiple Vitamin (MULTIVITAMIN) tablet Take 1 tablet by mouth daily.     Yes Historical Provider, MD  oxycodone (OXY-IR) 5 MG capsule Take 5 mg by mouth every 4 (four) hours as needed. PAIN    Yes Historical Provider, MD  lidocaine (LIDODERM) 5 % Place 1 patch onto the skin daily as needed. Remove & Discard patch within 12 hours or as directed by MD..PAIN    Historical Provider, MD  nitroGLYCERIN (NITROSTAT) 0.4 MG SL tablet Place 0.4 mg under the tongue every 5 (five) minutes as needed. CHEST PAIN    Historical Provider, MD    Current Facility-Administered Medications  Medication Dose Route Frequency Provider Last Rate Last Dose  . 0.9 %  sodium chloride infusion   Intravenous Continuous Arne Cleveland, MD 50 mL/hr at 10/24/11 1743    . aspirin tablet 325 mg  325 mg Oral Daily Peter A. Tucich, MD   325 mg at 10/24/11 1751  . aspirin tablet 81 mg  81 mg Oral Daily Peter A. Tucich, MD   81 mg at 10/25/11 1002  . bisacodyl (DULCOLAX) suppository 10 mg  10 mg Rectal Once Peter A. Tucich, MD      . ciprofloxacin (CIPRO) IVPB 400 mg  400 mg Intravenous Q24H Arne Cleveland, MD   400 mg at 10/25/11 1533  . diphenhydrAMINE (BENADRYL) injection 25 mg  25 mg Intravenous Q6H PRN Christina Rama   25 mg at 10/25/11 1453  . furosemide (LASIX) tablet 40 mg  40 mg Oral BID Christina Rama      . insulin aspart (novoLOG) injection 0-20 Units  0-20 Units Subcutaneous TID WC Arne Cleveland, MD   11 Units at 10/25/11 1337  . insulin detemir (LEVEMIR) injection 35 Units  35 Units Subcutaneous QHS Christina Rama      . lactulose (CHRONULAC) 10 GM/15ML solution 20 g  20 g Oral TID Christina Rama   20 g at 10/25/11 1535  . levothyroxine (SYNTHROID, LEVOTHROID) tablet 50 mcg  50 mcg Oral QAM Arne Cleveland, MD   50 mcg at 10/25/11 1002  . lidocaine (LIDODERM) 5 % 1 patch  1 patch  Transdermal Daily PRN Arne Cleveland, MD   1 patch at 10/25/11 1537  . metoprolol succinate (TOPROL-XL) 24 hr tablet 12.5 mg  12.5 mg Oral Daily Arne Cleveland, MD   12.5 mg at 10/25/11 1002  . morphine 2 MG/ML injection 2 mg  2 mg Intravenous Q2H PRN Christina Rama   2 mg at 10/25/11 1532  . multivitamin tablet 1 tablet  1 tablet Oral Daily Arne Cleveland, MD   1 tablet at 10/25/11  1001  . nitroGLYCERIN (NITROSTAT) SL tablet 0.4 mg  0.4 mg Sublingual Q5 min PRN Arne Cleveland, MD      . nystatin (NYSTOP) topical powder   Topical Daily PRN Arne Cleveland, MD      . oxyCODONE (Oxy IR/ROXICODONE) immediate release tablet 5 mg  5 mg Oral Q3H PRN Christina Rama      . sodium chloride 0.9 % bolus 500 mL  500 mL Intravenous Once Peter A. Tucich, MD   500 mL at 10/24/11 1743  . DISCONTD: aspirin tablet 81 mg  81 mg Oral QAM Arne Cleveland, MD      . DISCONTD: furosemide (LASIX) tablet 80 mg  80 mg Oral BID Arne Cleveland, MD   80 mg at 10/25/11 1002  . DISCONTD: insulin detemir (LEVEMIR) injection 25 Units  25 Units Subcutaneous QHS Arne Cleveland, MD   25 Units at 10/24/11 2220  . DISCONTD: morphine 2 MG/ML injection 2 mg  2 mg Intravenous Q4H PRN [place] Cyndie Mull, MD   2 mg at 10/25/11 1130    Allergies as of 10/24/2011 - Review Complete 10/24/2011  Allergen Reaction Noted  . Crestor (rosuvastatin calcium)  10/24/2011  . Statins Other (See Comments) 07/12/2011    Family History  Problem Relation Age of Onset  . Colon cancer Neg Hx   . Diabetes Mother   . Diabetes Father     History   Social History  . Marital Status: Married    Spouse Name: N/A    Number of Children: 1  . Years of Education: N/A   Occupational History  . Not on file.   Social History Main Topics  . Smoking status: Never Smoker   . Smokeless tobacco: Never Used  . Alcohol Use: No  . Drug Use: No  . Sexually Active: No   Other Topics Concern  . Not on file   Social History Narrative  . No narrative on file    Review of  Systems: Patient is confused and unable to give an accurate review of systems Physical Exam: Vital signs in last 24 hours: Temp:  [97.5 F (36.4 C)-98.2 F (36.8 C)] 97.5 F (36.4 C) (11/27 1412) Pulse Rate:  [98-110] 98  (11/27 1412) Resp:  [16-20] 20  (11/27 1412) BP: (113-139)/(63-80) 120/77 mmHg (11/27 1412) SpO2:  [96 %-100 %] 96 % (11/27 1412) Weight:  [120.657 kg (266 lb)] 266 lb (120.657 kg) (11/26 2115) Last BM Date: 10/24/11 General:   Alert,  Well-developed, well-nourished, pleasant and cooperative in NAD, confused Head:  Normocephalic and atraumatic. Eyes:  Sclera clear, he does have icterus   Conjunctiva pink. Ears:  Normal auditory acuity. Nose:  No deformity, discharge,  or lesions. Mouth:  No deformity or lesions.  Oropharynx pink & moist. Neck:  Supple; no masses or thyromegaly. Lungs:  Clear throughout to auscultation.   No wheezes, crackles, or rhonchi. No acute distress. Heart:  Regular rate and rhythm; soft somewhat squeaky systolic murmur. Abdomen:  Soft, nontender, large nontender ascites. No masses, hepatosplenomegaly or hernias noted. Normal bowel sounds, without guarding,    Rectal:  Deferred .    Pulses:  Normal pulses noted. Extremities:  Without clubbing ., 1+ edema ankles. Neurologic:  Alert and  oriented x1, he is confused as to the date he is speaking and sentences, some of which are garbled Skin:  Intact without significant lesions or rashes. Cervical Nodes:  No significant cervical adenopathy. Psych:  Alert and cooperative.   Intake/Output from previous day:  11/26 0701 - 11/27 0700 In: 512.5 [I.V.:512.5] Out: 0  Intake/Output this shift: Total I/O In: 240 [P.O.:240] Out: 1450 [Urine:1450]  Lab Results:  [facility] 10/25/11 0532 10/24/11 0925  WBC 9.6 8.8  HGB 11.2* 12.0*  HCT 32.4* 34.0*  PLT 232 313   BMET  Basename 10/25/11 0532 10/24/11 0925  NA 130* 126*  K 4.5 4.9  CL 94* 88*  CO2 23 24  GLUCOSE 230* 278*  BUN 68* 67*    CREATININE 1.85* 1.71*  CALCIUM 9.4 10.0   LFT  Basename 10/25/11 0532  PROT 6.5  ALBUMIN 2.7*  AST 34  ALT 22  ALKPHOS 103  BILITOT 2.7*  BILIDIR --  IBILI --         Studies/Results: Dg Abd Acute W/chest  10/24/2011  *RADIOLOGY REPORT*  Clinical Data: Abdominal pain, altered mental status  ACUTE ABDOMEN SERIES (ABDOMEN 2 VIEW & CHEST 1 VIEW)  Comparison: 10/16/2011, 05/28/2011  Findings: Decreased lung volumes noted.  Negative for CHF, pneumonia, effusion or pneumothorax.  Stable heart size and vascularity.  No free air.  Scattered air and stool throughout the bowel.  Nonspecific air- fluid levels on the decubitus view but negative for free air. Right hip arthroplasty noted.  IMPRESSION: No acute finding by plain radiography.  Original Report Authenticated By: Judie Petit. Ruel Favors, M.D.    Impression: #[id number]  61 year old male with Nash/cirrhosis with decompensated hepatic encephalopathy. He is somewhat improved today and ammonia level is coming down. Suspect this episode was triggered by a combination of urinary tract infection, and regular pain medication use causing constipation and decreasing efficacy of Chronulac. #2 status post hip replacement November 2012 #3 diabetes  #4 coronary artery disease status post MI and stents #5 chronic renal insufficiency  Plan:  Will increase Chronulac to 60 cc over the next 24-48 worse until he starts having loose then titrate down. He will need higher maintenance dose on discharge. Add Xifaxan 550 mg twice daily- very expensive and insurance coverage will dictate whether or not he will be able to stay on this at home.  I spoke to his wife by phone, we will follow.    [name]  10/25/2011, 4:03 PM  GI ATTENDING HISTORY REVIEWED, PATIENT SEEN AND EXAMINED. AGREE WITH H&P AS WELL AS IMPRESSION AND PLAN AS OUTLINED. WILL FOLLOW. THANKS

## 2011-10-25 NOTE — Progress Notes (Signed)
Interval History: [name] is a 60 year old male admitted on 10/24/11 with cirrhosis and altered mental status thought to be secondary to hepatic encephalopathy.  He was put on larger doses of lactulose upon admission.  ROS: The patient's speech is almost unintelligible.  His daughter is at the bedside, and says that this is typical of his speech when he is encephalopathic. His wife called in and I spoke with her by telephone.  She states "Dr. Christella Hartigan knows him well" and requests we consult him.  He is having right hip pain and some generalized pruritis, but denies rash.      Objective: Vital signs in last 24 hours: Temp:  [97.5 F (36.4 C)-98.2 F (36.8 C)] 97.5 F (36.4 C) (11/27 1412) Pulse Rate:  [98-110] 98  (11/27 1412) Resp:  [16-20] 20  (11/27 1412) BP: (113-139)/(63-80) 120/77 mmHg (11/27 1412) SpO2:  [96 %-100 %] 96 % (11/27 1412) Weight:  [120.657 kg (266 lb)] 266 lb (120.657 kg) (11/26 2115) Weight change:  Last BM Date: 10/24/11  Intake/Output from previous day:  Intake/Output Summary (Last 24 hours) at 10/25/11 1420 Last data filed at 10/25/11 1411  Gross per 24 hour  Intake  752.5 ml  Output   1450 ml  Net -697.5 ml     Physical Exam:  Gen:  NAD Cardiovascular:  RRR, No M/R/G Respiratory: Lungs CTAB Gastrointestinal: Abdomen soft, NT/ND with normal active bowel sounds. Extremities: Nail fungus, but otherwise no C/E/C Neurological: Disoriented with waxing/waning mental status.  No focal deficits.  Tongue midline.  Grips equal.   Lab Results: Basic Metabolic Panel:  Lab 10/25/11 1610 10/24/11 0925  NA 130* 126*  K 4.5 4.9  CL 94* 88*  CO2 23 24  GLUCOSE 230* 278*  BUN 68* 67*  CREATININE 1.85* 1.71*  CALCIUM 9.4 10.0  MG -- --  PHOS -- --   Liver Function Tests:  Lab 10/25/11 0532 10/24/11 0925  AST 34 29  ALT 22 22  ALKPHOS 103 107  BILITOT 2.7* 2.5*  PROT 6.5 7.0  ALBUMIN 2.7* 2.7*   No results found for this basename: LIPASE:5,AMYLASE:5  in the last 168 hours  Lab 10/25/11 0532 10/24/11 0925 10/19/11 0412  AMMONIA 77* 165* 33   CBC:  Lab 10/25/11 0532 10/24/11 0925  WBC 9.6 8.8  NEUTROABS -- 6.8  HGB 11.2* 12.0*  HCT 32.4* 34.0*  MCV 91.5 91.2  PLT 232 313   CBG:  Lab 10/25/11 1138 10/25/11 0737 10/24/11 2146 10/24/11 1711 10/21/11 1136  GLUCAP 260* 244* 199* 221* 265*   Hgb A1c  Basename 10/24/11 0925  HGBA1C 5.9*    Studies/Results: Dg Abd Acute W/chest  10/24/2011  *RADIOLOGY REPORT*  Clinical Data: Abdominal pain, altered mental status  ACUTE ABDOMEN SERIES (ABDOMEN 2 VIEW & CHEST 1 VIEW)  Comparison: 10/16/2011, 05/28/2011  Findings: Decreased lung volumes noted.  Negative for CHF, pneumonia, effusion or pneumothorax.  Stable heart size and vascularity.  No free air.  Scattered air and stool throughout the bowel.  Nonspecific air- fluid levels on the decubitus view but negative for free air. Right hip arthroplasty noted.  IMPRESSION: No acute finding by plain radiography.  Original Report Authenticated By: Judie Petit. Ruel Favors, M.D.    Medications: Scheduled Meds:   . aspirin  325 mg Oral Daily  . aspirin  81 mg Oral Daily  . bisacodyl  10 mg Rectal Once  . ciprofloxacin  400 mg Intravenous Q24H  . furosemide  80 mg Oral  BID  . insulin aspart  0-20 Units Subcutaneous TID WC  . insulin detemir  25 Units Subcutaneous QHS  . lactulose  20 g Oral TID  . levothyroxine  50 mcg Oral QAM  . metoprolol succinate  12.5 mg Oral Daily  . multivitamin  1 tablet Oral Daily  . sodium chloride  500 mL Intravenous Once  . DISCONTD: aspirin  81 mg Oral QAM   Continuous Infusions:   . sodium chloride 50 mL/hr at 10/24/11 1743   PRN Meds:.diphenhydrAMINE, lidocaine, morphine injection, nitroGLYCERIN, nystatin  Assessment/Plan:  Principal Problem:  *Hepatic encephalopathy The patient's wife requests GI involvement so I have requested a formal consultation.  I have increased his dose of lactulose to TID.  May  benefit from Rifaximin.  Will defer to GI. Active Problems:  AODM CBGs 199-265.  Needs better control.  Increase levemir.  Cirrhosis LFTs stable.  GI consulted.  Supportive care.  Hyponatremia Secondary to cirrhosis physiology.  Continue diuretics.  Sodium is improving.  Acute renal failure (ARF) Baseline creatinine is 1.21. (10/17/11).  Will decrease lasix to 40 mg BID.   LOS: 1 day   Hillery Aldo, MD Pager [phone]  10/25/2011, 2:20 PM

## 2011-10-26 DIAGNOSIS — G2581 Restless legs syndrome: Secondary | ICD-10-CM | POA: Diagnosis present

## 2011-10-26 DIAGNOSIS — B952 Enterococcus as the cause of diseases classified elsewhere: Secondary | ICD-10-CM | POA: Diagnosis present

## 2011-10-26 LAB — URINE CULTURE

## 2011-10-26 LAB — GLUCOSE, CAPILLARY
Glucose-Capillary: 174 mg/dL — ABNORMAL HIGH (ref 70–99)
Glucose-Capillary: 169 mg/dL — ABNORMAL HIGH (ref 70–99)
Glucose-Capillary: 230 mg/dL — ABNORMAL HIGH (ref 70–99)
Glucose-Capillary: 258 mg/dL — ABNORMAL HIGH (ref 70–99)

## 2011-10-26 LAB — AMMONIA: Ammonia: 50 umol/L (ref 11–60)

## 2011-10-26 MED ORDER — SODIUM CHLORIDE 0.9 % IV SOLN
1.5000 g | Freq: Four times a day (QID) | INTRAVENOUS | Status: DC
  Administered 2011-10-26 – 2011-10-28 (×8): 1.5 g via INTRAVENOUS
  Filled 2011-10-26 (×12): qty 1.5

## 2011-10-26 MED ORDER — SPIRONOLACTONE 100 MG PO TABS
100.0000 mg | ORAL_TABLET | Freq: Every day | ORAL | Status: DC
  Administered 2011-10-26 – 2011-10-31 (×6): 100 mg via ORAL
  Filled 2011-10-26 (×7): qty 1

## 2011-10-26 MED ORDER — ROPINIROLE HCL 1 MG PO TABS
1.0000 mg | ORAL_TABLET | Freq: Every day | ORAL | Status: DC
  Administered 2011-10-26 – 2011-10-30 (×5): 1 mg via ORAL
  Filled 2011-10-26 (×6): qty 1

## 2011-10-26 MED ORDER — INSULIN ASPART 100 UNIT/ML ~~LOC~~ SOLN
6.0000 [IU] | Freq: Three times a day (TID) | SUBCUTANEOUS | Status: DC
  Administered 2011-10-26 – 2011-10-31 (×16): 6 [IU] via SUBCUTANEOUS

## 2011-10-26 MED ORDER — INSULIN ASPART 100 UNIT/ML ~~LOC~~ SOLN
0.0000 [IU] | Freq: Every day | SUBCUTANEOUS | Status: DC
  Administered 2011-10-26 – 2011-10-29 (×2): 3 [IU] via SUBCUTANEOUS
  Administered 2011-10-30: 2 [IU] via SUBCUTANEOUS

## 2011-10-26 MED ORDER — INSULIN ASPART 100 UNIT/ML ~~LOC~~ SOLN
0.0000 [IU] | Freq: Three times a day (TID) | SUBCUTANEOUS | Status: DC
  Administered 2011-10-26: 7 [IU] via SUBCUTANEOUS
  Administered 2011-10-26: 4 [IU] via SUBCUTANEOUS
  Administered 2011-10-27: 11 [IU] via SUBCUTANEOUS
  Administered 2011-10-27 (×2): 3 [IU] via SUBCUTANEOUS
  Administered 2011-10-28: 4 [IU] via SUBCUTANEOUS
  Administered 2011-10-28: 7 [IU] via SUBCUTANEOUS
  Administered 2011-10-29 (×2): 4 [IU] via SUBCUTANEOUS
  Administered 2011-10-29: 11 [IU] via SUBCUTANEOUS
  Administered 2011-10-30 (×2): 7 [IU] via SUBCUTANEOUS
  Administered 2011-10-30 – 2011-10-31 (×3): 4 [IU] via SUBCUTANEOUS

## 2011-10-26 MED ORDER — LACTULOSE 10 GM/15ML PO SOLN
30.0000 g | Freq: Two times a day (BID) | ORAL | Status: DC
  Administered 2011-10-26 – 2011-10-27 (×3): 30 g via ORAL
  Filled 2011-10-26 (×4): qty 45

## 2011-10-26 MED ORDER — INSULIN DETEMIR 100 UNIT/ML ~~LOC~~ SOLN
40.0000 [IU] | Freq: Every day | SUBCUTANEOUS | Status: DC
  Administered 2011-10-26 – 2011-10-29 (×4): 40 [IU] via SUBCUTANEOUS

## 2011-10-26 NOTE — Progress Notes (Signed)
Psychosocial assessment completed and placed in shadow chart.   CSW met with patient and patients spouse, debbie [phone]) [phone], at bedside. Patient came from [facility] where patient has been for three days following a hip replacement. Patient and spouse requesting that CSW look into other options for facilities for patient to go for SNF other than [facility]. If no other option is found that they agree with then patient will return to [facility]. FL-2 completed and placed in chart. CSW to find bed offers and continue to follow.

## 2011-10-26 NOTE — Progress Notes (Addendum)
Patient ID: BIRL LOBELLO, male   DOB: [birth year], 60 y.o.   MRN: [id number] [facility] Progress Note  Subjective: Jaaziel is mentating better this morning, his speech is more understandable, and he knows he is not saying things properly. He feels the staff is upset with him because he had multiple bowel movements last night. He says his right hip is uncomfortable but is not in severe pain.  Objective:  Vital signs in last 24 hours: Temp:  [97.5 F (36.4 C)-98.7 F (37.1 C)] 97.8 F (36.6 C) (11/28 0621) Pulse Rate:  [92-102] 102  (11/28 0621) Resp:  [18-20] 18  (11/28 0621) BP: (120-148)/(68-82) 148/82 mmHg (11/28 0621) SpO2:  [96 %-100 %] 97 % (11/28 0621) Last BM Date: 10/25/11 General:   Alert,  Well-developed, obese white male in NAD, emotionally labile Heart:  Regular rate and rhythm; no murmurs Abdomen:  Soft, nontender , nondistended ascites. Normal bowel sounds, without guarding   Extremities:  1+ edema Neurologic:  Alert and  oriented x2 , no asterixis. Psych:  Alert and cooperative, still confused intermittently and emotional Intake/Output from previous day: 11/27 0701 - 11/28 0700 In: 1883 [P.O.:420; I.V.:1463] Out: 1900 [Urine:1900] Intake/Output this shift:    Lab Results:  Basename 10/25/11 0532 10/24/11 0925  WBC 9.6 8.8  HGB 11.2* 12.0*  HCT 32.4* 34.0*  PLT 232 313   BMET  Basename 10/25/11 0532 10/24/11 0925  NA 130* 126*  K 4.5 4.9  CL 94* 88*  CO2 23 24  GLUCOSE 230* 278*  BUN 68* 67*  CREATININE 1.85* 1.71*  CALCIUM 9.4 10.0   LFT  Basename 10/25/11 0532  PROT 6.5  ALBUMIN 2.7*  AST 34  ALT 22  ALKPHOS 103  BILITOT 2.7*  BILIDIR --  IBILI --    Ammonia ==50 Urine culture + entercoccus        Assessment / Plan: #39 60 year old male with exacerbation of hepatic encephalopathy, likely triggered by urinary tract infection , narcotic pain medications and constipation. He is improving but not back to his  baseline  Continue Xifaxan 550 mg by mouth twice daily, will see if his insurance will cover this long-term. Continue increased doses of Chronulac, we'll back down to 60 cc twice daily as he is having multiple bowel movements. #2 Recent anasarca, He has much less edema, and is stable on his current dose of diuretics, Lasix 80 mg twice daily ,note he has been off Aldactone-will restart at lower dose-100 mg daily.  #3 recent hip replacement/ Right- need to get PT involved today Principal Problem:  *Hepatic encephalopathy Active Problems:  AODM  Cirrhosis  Hyponatremia  Acute renal failure (ARF)     LOS: 2 days   [name]  10/26/2011, 9:26 AM   GI ATTENDING SEEN, EXAMINED. AGREE W/ ABOVE. DISCUSSED W/ DAUGHTER WHO WAS AT BEDSIDE.

## 2011-10-26 NOTE — Progress Notes (Signed)
Interval History: [name] is a 60 year old male admitted on 10/24/11 with cirrhosis and altered mental status thought to be secondary to hepatic encephalopathy. He was put on larger doses of lactulose upon admission, and is slowly improving.  ROS: Had multiple loose stools last night, prompting the GI doctors to decrease his lactulose to BID.  His speech is much clearer.  No complaints of pain.  Wants something for restless legs.  Ongoing c/o right hip pain.  Objective: Vital signs in last 24 hours: Temp:  [97.5 F (36.4 C)-98.7 F (37.1 C)] 97.8 F (36.6 C) (11/28 0621) Pulse Rate:  [92-102] 102  (11/28 0621) Resp:  [18-20] 18  (11/28 0621) BP: (120-148)/(68-82) 148/82 mmHg (11/28 0621) SpO2:  [96 %-100 %] 97 % (11/28 0454) Weight change:  Last BM Date: 10/25/11  Intake/Output from previous day:  Intake/Output Summary (Last 24 hours) at 10/26/11 1049 Last data filed at 10/26/11 0534  Gross per 24 hour  Intake   1643 ml  Output   1300 ml  Net    343 ml     Physical Exam:  Gen: NAD  Cardiovascular: RRR, No M/R/G  Respiratory: Lungs CTAB  Gastrointestinal: Abdomen soft, NT/ND with normal active bowel sounds.  Extremities: Nail fungus, but otherwise no C/E/C  Neurological: Non-focal  Lab Results: Basic Metabolic Panel:  Lab 10/25/11 0981 10/24/11 0925  NA 130* 126*  K 4.5 4.9  CL 94* 88*  CO2 23 24  GLUCOSE 230* 278*  BUN 68* 67*  CREATININE 1.85* 1.71*  CALCIUM 9.4 10.0  MG -- --  PHOS -- --   Liver Function Tests:  Lab 10/25/11 0532 10/24/11 0925  AST 34 29  ALT 22 22  ALKPHOS 103 107  BILITOT 2.7* 2.5*  PROT 6.5 7.0  ALBUMIN 2.7* 2.7*    CBC:  Lab 10/25/11 0532 10/24/11 0925  WBC 9.6 8.8  NEUTROABS -- 6.8  HGB 11.2* 12.0*  HCT 32.4* 34.0*  MCV 91.5 91.2  PLT 232 313   CBG:  Lab 10/26/11 0726 10/25/11 2148 10/25/11 1637 10/25/11 1138 10/25/11 0737  GLUCAP 169* 174* 220* 260* 244*   Hgb A1c  Basename 10/24/11 0925  HGBA1C 5.9*     Recent Results (from the past 240 hour(s))  URINE CULTURE     Status: Normal (Preliminary result)   Collection Time   10/24/11 11:40 AM      Component Value Range Status Comment   Specimen Description URINE, RANDOM   Final    Special Requests NONE   Final    Setup Time [id number]   Final    Colony Count >=100,000 COLONIES/ML   Final    Culture ENTEROCOCCUS SPECIES   Final    Report Status PENDING   Incomplete     Studies/Results: No results found.  Medications: Scheduled Meds:   . aspirin  81 mg Oral Daily  . bisacodyl  10 mg Rectal Once  . ciprofloxacin  400 mg Intravenous Q24H  . furosemide  40 mg Oral BID  . insulin aspart  0-20 Units Subcutaneous TID WC  . insulin detemir  35 Units Subcutaneous QHS  . lactulose  30 g Oral BID  . levothyroxine  50 mcg Oral Q0700  . metoprolol succinate  12.5 mg Oral Daily  . multivitamin  1 tablet Oral Daily  . rifaximin  550 mg Oral BID  . spironolactone  100 mg Oral Daily  . DISCONTD: aspirin  325 mg Oral Daily  . DISCONTD: furosemide  80 mg Oral BID  . DISCONTD: insulin detemir  25 Units Subcutaneous QHS  . DISCONTD: lactulose  20 g Oral TID  . DISCONTD: lactulose  30 g Oral QID   Continuous Infusions:   . sodium chloride 50 mL/hr at 10/26/11 0534   PRN Meds:.diphenhydrAMINE, lidocaine, morphine injection, nitroGLYCERIN, nystatin, oxyCODONE, DISCONTD:  morphine injection  Assessment/Plan:  Principal Problem:  *Hepatic encephalopathy Improving on lactulose.  Rifaximin started by GI.  Ammonia level normalized.  D/C neuro checks. Active Problems:  Acute renal failure (ARF) No appreciable improvement in renal function despite decreasing lasix.  Spironolactone added by GI, so continue to monitor renal function closely.  Baseline creatinine is 1.21. (10/17/11).   Enterococcus UTI D/C cipro.  Start Unasyn.  F/U sensitivities.  AODM CBGs 169-260.  Increase levemir to 40 units. Add meal coverage.  Cirrhosis LFTs stable. GI  consulted. Supportive care.  Hyponatremia Secondary to cirrhosis physiology. Continue diuretics. Sodium is improving.  RLS Start requip.    LOS: 2 days   Collin Aldo, MD Pager [phone]  10/26/2011, 10:49 AM

## 2011-10-26 NOTE — Progress Notes (Signed)
Physical Therapy Evaluation Patient Details Name: [name] MRN: [id number] DOB: [birth year] Today's Date: 10/26/2011 Time: 6295-2841 Charge: EVII  Problem List:  Patient Active Problem List  Diagnoses  . AODM  . OBSTRUCTIVE SLEEP APNEA  . Essential hypertension, benign  . Cirrhosis  . Coronary atherosclerosis of native coronary artery  . Hyperlipidemia  . Morbid obesity  . Arthritis pain of hip  . CRI (chronic renal insufficiency)  . NAFLD (nonalcoholic fatty liver disease)  . Anemia  . Hepatic encephalopathy  . Hyponatremia  . Acute renal failure (ARF)  . Enterococcus UTI  . RLS (restless legs syndrome)    Past Medical History:  Past Medical History  Diagnosis Date  . Hypertension   . Type II or unspecified type diabetes mellitus without mention of complication, not stated as uncontrolled   . Thyroid disease   . Cirrhosis, non-alcoholic   . Morbid obesity   . Pancytopenia   . Fatty liver   . Coronary artery disease   . Myocardial infarction     "HEART INCIDENT " OCT 2012  . Angina     RARE USE OF NTG  . CHF (congestive heart failure)     JUNE 2012  . Arthritis   . Gout 35 YRS AGO  . Cellulitis of left leg   . Cirrhosis of liver     NONALCOHOLIC  . Nocturia   . Elevated BUN   . Elevated serum creatinine   . Serum ammonia increased   . Splenomegaly   . Platelets decreased   . Thyroid disorder     PT DOES NOT KNOW IF LOW OR HIGH  . Obstructive sleep apnea     USES O2 WITH C - PAP SETTING FOR C-PA IS 16  . Chronic kidney disease (CKD), stage III (moderate)    Past Surgical History:  Past Surgical History  Procedure Date  . Coronary stent placement     2 CARDIAC STENTS OCT 08/2011  . Total knee arthroplasty 10/14/2011  . Total hip arthroplasty 10/14/2011    Procedure: TOTAL HIP ARTHROPLASTY ANTERIOR APPROACH;  Surgeon: Kathryne Hitch;  Location: [facility];  Service: Orthopedics;  Laterality: Right;  . Joint replacement   . Appendectomy 1968    . Tonsillectomy 1957  . Cardiac catheterization 09/08/2011    PT Assessment/Plan/Recommendation PT Assessment Clinical Impression Statement: Pt presents with decreased cognition which is effecting his mobility status at this time.  Pt would benefit from acute PT services to improve independence with transfers and ambulation in order to prepare for D/C to SNF. PT Recommendation/Assessment: Patient will need skilled PT in the acute care venue PT Problem List: Decreased strength;Decreased activity tolerance;Decreased mobility;Decreased safety awareness;Decreased knowledge of use of DME;Decreased cognition;Obesity PT Therapy Diagnosis : Difficulty walking;Altered mental status PT Plan PT Frequency: Min 3X/week PT Treatment/Interventions: DME instruction;Gait training;Functional mobility training;Therapeutic exercise;Patient/family education;Cognitive remediation PT Recommendation Follow Up Recommendations: Skilled nursing facility Equipment Recommended: Defer to next venue PT Goals  Acute Rehab PT Goals PT Goal Formulation: With patient Time For Goal Achievement: 7 days Pt will go Supine/Side to Sit: with supervision PT Goal: Supine/Side to Sit - Progress: Progressing toward goal Pt will Transfer Sit to Stand/Stand to Sit: with supervision PT Transfer Goal: Sit to Stand/Stand to Sit - Progress: Progressing toward goal Pt will Ambulate: 51 - 150 feet;with least restrictive assistive device;with supervision PT Goal: Ambulate - Progress: Other (comment) Pt will Perform Home Exercise Program: with supervision, verbal cues required/provided PT Goal: Perform Home Exercise Program -  Progress: Other (comment)  PT Evaluation Precautions/Restrictions  Precautions Precautions: Fall Precaution Comments: s/p recent Direct Anterior THR with no hip precautions Restrictions RLE Weight Bearing: Weight bearing as tolerated Prior Functioning  Home Living Lives With: Spouse Additional Comments: Pt  plans for D/C to SNF. Prior Function Level of Independence: Requires assistive device for independence Comments: Pt depends on rollator for ambulation and ADLs prior to R THR. Cognition Cognition Arousal/Alertness: Lethargic Overall Cognitive Status: Impaired Orientation Level: Oriented to person;Oriented to place;Disoriented to time Safety/Judgement: Decreased awareness of safety precautions;Decreased safety judgement for tasks assessed Decreased Safety/Judgement: Decreased awareness of need for assistance Problem Solving: Requires assistance for problem solving Cognition - Other Comments: Pt with decreased cognition 2* current medical condition. Sensation/Coordination   Extremity Assessment RLE Assessment RLE Assessment: Exceptions to [facility] RLE Strength RLE Overall Strength Comments: grossly 2+/5 throughout per observation (pt not moving LE through full AROM) LLE Assessment LLE Assessment: Exceptions to [facility] LLE Strength LLE Overall Strength Comments: grossly at least 3/5 as pt was able to move against gravity. Mobility (including Balance) Bed Mobility Bed Mobility: Yes Supine to Sit: 1: +2 Total assist;HOB elevated (Comment degrees) Supine to Sit Details (indicate cue type and reason): HOB elevated, pt=40%, verbal cues for technique, pt able to verbalize keeping legs together during transfer to decrease hip pain Sitting - Scoot to Edge of Bed: 1: +1 Total assist Sitting - Scoot to Edge of Bed Details (indicate cue type and reason): with bed pad to assist Transfers Transfers: Yes Sit to Stand: 1: +2 Total assist;From bed;From elevated surface;With upper extremity assist Sit to Stand Details (indicate cue type and reason): pt=60%, increased verbal cues for safe technique 2* decreased cognition Stand to Sit: 1: +2 Total assist;To chair/3-in-1;With upper extremity assist Stand to Sit Details:  increased verbal cues for safe technique 2* decreased cognition Stand Pivot Transfers: 1: +2  Total assist Stand Pivot Transfer Details (indicate cue type and reason): pt=75%, 100% verbal cues for performing transfer 2* decreased cognition. Ambulation/Gait Ambulation/Gait: No (not performed 2* decreased cognition)    Exercise    End of Session PT - End of Session Equipment Utilized During Treatment: Gait belt Activity Tolerance:  (limited 2* decreased cognition) Patient left: in chair;with call bell in reach;with family/visitor present (with sitter) Nurse Communication: Mobility status for transfers General Behavior During Session: [facility] for tasks performed Cognition: Impaired  Skarleth Delmonico,KATHrine E 10/26/2011, 4:06 PM Pager: [phone]
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
## 2011-10-27 LAB — CBC
MCHC: 34.3 g/dL (ref 30.0–36.0)
Platelets: 220 10*3/uL (ref 150–400)
RDW: 16.1 % — ABNORMAL HIGH (ref 11.5–15.5)
WBC: 10.2 10*3/uL (ref 4.0–10.5)

## 2011-10-27 LAB — GLUCOSE, CAPILLARY: Glucose-Capillary: 130 mg/dL — ABNORMAL HIGH (ref 70–99)

## 2011-10-27 LAB — BASIC METABOLIC PANEL
BUN: 43 mg/dL — ABNORMAL HIGH (ref 6–23)
Calcium: 9.5 mg/dL (ref 8.4–10.5)
Creatinine, Ser: 1.32 mg/dL (ref 0.50–1.35)
GFR calc Af Amer: 66 mL/min — ABNORMAL LOW (ref >90)

## 2011-10-27 MED ORDER — LACTULOSE 10 GM/15ML PO SOLN
30.0000 g | Freq: Four times a day (QID) | ORAL | Status: DC
  Administered 2011-10-27 – 2011-10-28 (×3): 30 g via ORAL
  Filled 2011-10-27 (×7): qty 45

## 2011-10-27 MED ORDER — PANTOPRAZOLE SODIUM 40 MG PO TBEC
40.0000 mg | DELAYED_RELEASE_TABLET | Freq: Every day | ORAL | Status: DC
  Administered 2011-10-27 – 2011-10-31 (×5): 40 mg via ORAL
  Filled 2011-10-27 (×5): qty 1

## 2011-10-27 NOTE — Progress Notes (Signed)
Patient ID: [name], male   DOB: 1951-03-21, 60 y.o.   MRN: [id number] [facility] Progress Note  Subjective: [name] is improving, says he still gets confuced intermittently , "but I am lucid" this am. No c/o , was able to walk with PT yesterday.  Objective:  Vital signs in last 24 hours: Temp:  [97.5 F (36.4 C)-98.2 F (36.8 C)] 97.5 F (36.4 C) (11/29 0525) Pulse Rate:  [78-95] 78  (11/29 0525) Resp:  [18-24] 24  (11/29 0525) BP: (108-137)/(60-83) 137/83 mmHg (11/29 0525) SpO2:  [96 %-98 %] 96 % (11/29 0525) Last BM Date: 10/27/11 General:   Alert,  Well-developed,  White  Male in NAD, mentating better, still labile Heart:  Regular rate and rhythm; no murmurs Abdomen:  Soft, nontender and nondistended. Normal bowel sounds, without guarding, and without rebound.   Extremities:  Without edema. Neurologic:  Alert and  Oriented x2 no asterixis Psych:  Alert and cooperative.   Intake/Output from previous day: 11/28 0701 - 11/29 0700 In: 1400 [P.O.:1200; IV Piggyback:200] Out: 2275 [Urine:2275] Intake/Output this shift: Total I/O In: 240 [P.O.:240] Out: 575 [Urine:575]  Lab Results:  [facility] 10/27/11 0530 10/25/11 0532  WBC 10.2 9.6  HGB 11.9* 11.2*  HCT 34.7* 32.4*  PLT 220 232   BMET  Basename 10/27/11 0530 10/25/11 0532  NA 134* 130*  K 4.5 4.5  CL 97 94*  CO2 28 23  GLUCOSE 117* 230*  BUN 43* 68*  CREATININE 1.32 1.85*  CALCIUM 9.5 9.4   LFT  Basename 10/25/11 0532  PROT 6.5  ALBUMIN 2.7*  AST 34  ALT 22  ALKPHOS 103  BILITOT 2.7*  BILIDIR --  IBILI --    Assessment / Plan: #1 60 yo male with acute exacerbation of Hepatic Encephalopathy, improving daily but definitely not at his baseline. Continue Xifaxan  Will increase Chronulac again , as not having frequent enough bm's. #2 S?P Right  Hip replacement -10/14/11-  He will need to go back to rehab but I do not think he will be ready until Monday. Principal Problem:  *Hepatic  encephalopathy Active Problems:  AODM  Cirrhosis  Hyponatremia  Acute renal failure (ARF)  Enterococcus UTI  RLS (restless legs syndrome)     LOS: 3 days   [name]  10/27/2011, 9:50 AM   AGREE. JNP

## 2011-10-27 NOTE — Progress Notes (Signed)
Interval History: [name] is a 60 year old male admitted on 10/24/11 with cirrhosis and altered mental status thought to be secondary to hepatic encephalopathy. He was put on larger doses of lactulose upon admission, and is slowly improving.  ROS: Had multiple loose stools last night, prompting the GI doctors to decrease his lactulose to BID.  His speech is much clearer.  No complaints of pain.  Wants something for restless legs.  Ongoing c/o right hip pain.  Objective: Vital signs in last 24 hours: Temp:  [97.5 F (36.4 C)-98.2 F (36.8 C)] 97.5 F (36.4 C) (11/29 0525) Pulse Rate:  [70-95] 70  (11/29 1018) Resp:  [18-24] 24  (11/29 0525) BP: (108-140)/(60-83) 140/80 mmHg (11/29 1018) SpO2:  [96 %-98 %] 96 % (11/29 0525) Weight change:  Last BM Date: 10/27/11  Intake/Output from previous day:  Intake/Output Summary (Last 24 hours) at 10/27/11 1056 Last data filed at 10/27/11 0853  Gross per 24 hour  Intake   1640 ml  Output   2850 ml  Net  -1210 ml     Physical Exam:  Gen: NAD  Cardiovascular: RRR, No M/R/G  Respiratory: Lungs CTAB  Gastrointestinal: Abdomen soft, NT/ND with normal active bowel sounds.  Extremities: Nail fungus, but otherwise no C/E/C  Neurological: Non-focal  Lab Results: Basic Metabolic Panel:  Lab 10/27/11 1610 10/25/11 0532 10/24/11 0925  NA 134* 130* 126*  K 4.5 4.5 --  CL 97 94* 88*  CO2 28 23 24   GLUCOSE 117* 230* 278*  BUN 43* 68* 67*  CREATININE 1.32 1.85* 1.71*  CALCIUM 9.5 9.4 10.0  MG -- -- --  PHOS -- -- --   Liver Function Tests:  Lab 10/25/11 0532 10/24/11 0925  AST 34 29  ALT 22 22  ALKPHOS 103 107  BILITOT 2.7* 2.5*  PROT 6.5 7.0  ALBUMIN 2.7* 2.7*    CBC:  Lab 10/27/11 0530 10/25/11 0532 10/24/11 0925  WBC 10.2 9.6 8.8  NEUTROABS -- -- 6.8  HGB 11.9* 11.2* 12.0*  HCT 34.7* 32.4* 34.0*  MCV 93.8 91.5 91.2  PLT 220 232 313   CBG:  Lab 10/27/11 0747 10/26/11 2247 10/26/11 1706 10/26/11 1144 10/26/11 0726    GLUCAP 130* 258* 164* 230* 169*   Hgb A1c No results found for this basename: HGBA1C:2 in the last 72 hours  Recent Results (from the past 240 hour(s))  URINE CULTURE     Status: Normal   Collection Time   10/24/11 11:40 AM      Component Value Range Status Comment   Specimen Description URINE, RANDOM   Final    Special Requests NONE   Final    Setup Time [id number]   Final    Colony Count >=100,000 COLONIES/ML   Final    Culture ENTEROCOCCUS SPECIES   Final    Report Status 10/26/2011 FINAL   Final    Organism ID, Bacteria ENTEROCOCCUS SPECIES   Final     Studies/Results: No results found.  Medications: Scheduled Meds:    . ampicillin-sulbactam (UNASYN) IV  1.5 g Intravenous Q6H  . aspirin  81 mg Oral Daily  . bisacodyl  10 mg Rectal Once  . furosemide  40 mg Oral BID  . insulin aspart  0-20 Units Subcutaneous TID WC  . insulin aspart  0-5 Units Subcutaneous QHS  . insulin aspart  6 Units Subcutaneous TID WC  . insulin detemir  40 Units Subcutaneous QHS  . lactulose  30 g Oral QID  .  levothyroxine  50 mcg Oral Q0700  . metoprolol succinate  12.5 mg Oral Daily  . multivitamin  1 tablet Oral Daily  . rifaximin  550 mg Oral BID  . rOPINIRole  1 mg Oral QHS  . spironolactone  100 mg Oral Daily  . DISCONTD: ciprofloxacin  400 mg Intravenous Q24H  . DISCONTD: insulin aspart  0-20 Units Subcutaneous TID WC  . DISCONTD: insulin detemir  35 Units Subcutaneous QHS  . DISCONTD: lactulose  30 g Oral BID   Continuous Infusions:    . DISCONTD: sodium chloride 50 mL/hr at 10/26/11 0534   PRN Meds:.diphenhydrAMINE, lidocaine, morphine injection, nitroGLYCERIN, nystatin, oxyCODONE  Assessment/Plan:  Principal Problem: Hepatic encephalopathy Improving on lactulose and Rifaximin.  Ammonia level is normal.  Active Problems:  Acute renal failure (ARF) Monitor renal function.  Baseline creatinine is 1.21. (10/17/11).    Enterococcus UTI Continue Unasyn.     AODM CBGs  130-253.  Levemir increased to 40 units and meal coverage added.   Cirrhosis LFTs stable. GI following.   Hyponatremia Secondary to cirrhosis physiology. Sodium is improving.   RLS On requip.  Reflux Patient complains of reflux symptoms. Added Protonix.    LOS: 3 days   Molli Posey, MD Pager [phone]  10/27/2011, 10:56 AM

## 2011-10-28 LAB — BASIC METABOLIC PANEL
BUN: 38 mg/dL — ABNORMAL HIGH (ref 6–23)
Chloride: 96 mEq/L (ref 96–112)
Creatinine, Ser: 1.3 mg/dL (ref 0.50–1.35)
GFR calc Af Amer: 67 mL/min — ABNORMAL LOW (ref >90)
Glucose, Bld: 140 mg/dL — ABNORMAL HIGH (ref 70–99)
Potassium: 3.8 mEq/L (ref 3.5–5.1)

## 2011-10-28 LAB — CBC
HCT: 35.4 % — ABNORMAL LOW (ref 39.0–52.0)
Hemoglobin: 12.5 g/dL — ABNORMAL LOW (ref 13.0–17.0)
MCHC: 35.3 g/dL (ref 30.0–36.0)
MCV: 91.7 fL (ref 78.0–100.0)
RDW: 16 % — ABNORMAL HIGH (ref 11.5–15.5)
WBC: 7.8 10*3/uL (ref 4.0–10.5)

## 2011-10-28 LAB — GLUCOSE, CAPILLARY
Glucose-Capillary: 236 mg/dL — ABNORMAL HIGH (ref 70–99)
Glucose-Capillary: 197 mg/dL — ABNORMAL HIGH (ref 70–99)

## 2011-10-28 MED ORDER — LACTULOSE 10 GM/15ML PO SOLN
20.0000 g | Freq: Three times a day (TID) | ORAL | Status: DC
  Administered 2011-10-28 – 2011-10-31 (×7): 20 g via ORAL
  Filled 2011-10-28 (×11): qty 30

## 2011-10-28 MED ORDER — AMOXICILLIN-POT CLAVULANATE 875-125 MG PO TABS: 1.0000 | ORAL_TABLET | Freq: Two times a day (BID) | ORAL | Status: DC

## 2011-10-28 MED ORDER — AMOXICILLIN-POT CLAVULANATE 875-125 MG PO TABS
1.0000 | ORAL_TABLET | Freq: Two times a day (BID) | ORAL | Status: DC
  Administered 2011-10-28 – 2011-10-31 (×7): 1 via ORAL
  Filled 2011-10-28 (×10): qty 1

## 2011-10-28 NOTE — Progress Notes (Signed)
CSW spoke w/pts spouse, debbie regarding discharge planning. Gave her names of SNF's that contract with pts insurance and have offered pt a bed. She will give CSW her choice on Monday AM. Avi Archuleta C. Aemon Koeller MSW, LCSW [phone]

## 2011-10-28 NOTE — Progress Notes (Signed)
Subjective: Requesting Inpatient rehabilitation, denies any abdominal pain or nausea, multiple bowel movements secondary to lactulose.  Objective: Vital signs in last 24 hours: Filed Vitals:   10/27/11 1018 10/27/11 1438 10/27/11 2153 10/28/11 0555  BP: 140/80 110/73 109/75 133/74  Pulse: 70 82 94 89  Temp:  98.4 F (36.9 C) 98 F (36.7 C) 97.8 F (36.6 C)  TempSrc:  Oral Oral Oral  Resp:  20 20 20   Height:      Weight:      SpO2:  96% 97% 93%    Intake/Output Summary (Last 24 hours) at 10/28/11 1144 Last data filed at 10/28/11 0900  Gross per 24 hour  Intake    920 ml  Output   2204 ml  Net  -1284 ml    Weight change:   General: Alert, awake, oriented x3, in no acute distress. HEENT: No bruits, no goiter. Heart: Regular rate and rhythm, without murmurs, rubs, gallops. Lungs: Clear to auscultation bilaterally. Abdomen: Soft, nontender, nondistended, positive bowel sounds. Extremities: No clubbing cyanosis or edema with positive pedal pulses. Neuro: Grossly intact, nonfocal.    Lab Results: Results for orders placed during the hospital encounter of 10/24/11 (from the past 24 hour(s))  GLUCOSE, CAPILLARY     Status: Abnormal   Collection Time   10/27/11  5:05 PM      Component Value Range   Glucose-Capillary 146 (*) 70 - 99 (mg/dL)   Comment 1 Notify RN    GLUCOSE, CAPILLARY     Status: Abnormal   Collection Time   10/27/11  8:38 PM      Component Value Range   Glucose-Capillary 137 (*) 70 - 99 (mg/dL)   Comment 1 Documented in Chart     Comment 2 Notify RN    CBC     Status: Abnormal   Collection Time   10/28/11  5:45 AM      Component Value Range   WBC 7.8  4.0 - 10.5 (K/uL)   RBC 3.86 (*) 4.22 - 5.81 (MIL/uL)   Hemoglobin 12.5 (*) 13.0 - 17.0 (g/dL)   HCT 16.1 (*) 09.6 - 52.0 (%)   MCV 91.7  78.0 - 100.0 (fL)   MCH 32.4  26.0 - 34.0 (pg)   MCHC 35.3  30.0 - 36.0 (g/dL)   RDW 04.5 (*) 40.9 - 15.5 (%)   Platelets 188  150 - 400 (K/uL)  BASIC  METABOLIC PANEL     Status: Abnormal   Collection Time   10/28/11  5:45 AM      Component Value Range   Sodium 131 (*) 135 - 145 (mEq/L)   Potassium 3.8  3.5 - 5.1 (mEq/L)   Chloride 96  96 - 112 (mEq/L)   CO2 28  19 - 32 (mEq/L)   Glucose, Bld 140 (*) 70 - 99 (mg/dL)   BUN 38 (*) 6 - 23 (mg/dL)   Creatinine, Ser 8.11  0.50 - 1.35 (mg/dL)   Calcium 8.8  8.4 - 91.4 (mg/dL)   GFR calc non Af Amer 58 (*) >90 (mL/min)   GFR calc Af Amer 67 (*) >90 (mL/min)  GLUCOSE, CAPILLARY     Status: Abnormal   Collection Time   10/28/11  7:14 AM      Component Value Range   Glucose-Capillary 117 (*) 70 - 99 (mg/dL)  GLUCOSE, CAPILLARY     Status: Abnormal   Collection Time   10/28/11 11:19 AM      Component Value Range  Glucose-Capillary 236 (*) 70 - 99 (mg/dL)     Micro: Recent Results (from the past 240 hour(s))  URINE CULTURE     Status: Normal   Collection Time   10/24/11 11:40 AM      Component Value Range Status Comment   Specimen Description URINE, RANDOM   Final    Special Requests NONE   Final    Setup Time [id number]   Final    Colony Count >=100,000 COLONIES/ML   Final    Culture ENTEROCOCCUS SPECIES   Final    Report Status 10/26/2011 FINAL   Final    Organism ID, Bacteria ENTEROCOCCUS SPECIES   Final     Studies/Results: No results found.  Medications:  Results for orders placed during the hospital encounter of 10/24/11 (from the past 24 hour(s))  GLUCOSE, CAPILLARY     Status: Abnormal   Collection Time   10/27/11  5:05 PM      Component Value Range   Glucose-Capillary 146 (*) 70 - 99 (mg/dL)   Comment 1 Notify RN    GLUCOSE, CAPILLARY     Status: Abnormal   Collection Time   10/27/11  8:38 PM      Component Value Range   Glucose-Capillary 137 (*) 70 - 99 (mg/dL)   Comment 1 Documented in Chart     Comment 2 Notify RN    CBC     Status: Abnormal   Collection Time   10/28/11  5:45 AM      Component Value Range   WBC 7.8  4.0 - 10.5 (K/uL)   RBC 3.86  (*) 4.22 - 5.81 (MIL/uL)   Hemoglobin 12.5 (*) 13.0 - 17.0 (g/dL)   HCT 24.4 (*) 01.0 - 52.0 (%)   MCV 91.7  78.0 - 100.0 (fL)   MCH 32.4  26.0 - 34.0 (pg)   MCHC 35.3  30.0 - 36.0 (g/dL)   RDW 27.2 (*) 53.6 - 15.5 (%)   Platelets 188  150 - 400 (K/uL)  BASIC METABOLIC PANEL     Status: Abnormal   Collection Time   10/28/11  5:45 AM      Component Value Range   Sodium 131 (*) 135 - 145 (mEq/L)   Potassium 3.8  3.5 - 5.1 (mEq/L)   Chloride 96  96 - 112 (mEq/L)   CO2 28  19 - 32 (mEq/L)   Glucose, Bld 140 (*) 70 - 99 (mg/dL)   BUN 38 (*) 6 - 23 (mg/dL)   Creatinine, Ser 6.44  0.50 - 1.35 (mg/dL)   Calcium 8.8  8.4 - 03.4 (mg/dL)   GFR calc non Af Amer 58 (*) >90 (mL/min)   GFR calc Af Amer 67 (*) >90 (mL/min)  GLUCOSE, CAPILLARY     Status: Abnormal   Collection Time   10/28/11  7:14 AM      Component Value Range   Glucose-Capillary 117 (*) 70 - 99 (mg/dL)  GLUCOSE, CAPILLARY     Status: Abnormal   Collection Time   10/28/11 11:19 AM      Component Value Range   Glucose-Capillary 236 (*) 70 - 99 (mg/dL)     Assessment:  #1 Hepatic encephalopathy  Improving on lactulose and Rifaximin. Given him a prescription to continue rifaximin at discharge. Ammonia level is normal.  Active Problems:  #2 Acute renal failure (ARF)  Monitor renal function. Baseline creatinine is 1.21. (10/17/11).  #3 Enterococcus UTI  DC Unasyn. Change to Augmentin. #4 AODM  CBGs 130-253.  Levemir increased to 40 units and meal coverage added.  #5 Cirrhosis  LFTs stable. GI following. Continue Lasix and Aldactone. Continue lactulose. #6 Hyponatremia  Secondary to cirrhosis physiology. Sodium is improving.  #7 RLS  On requip.  #8 Reflux  Patient complains of reflux symptoms. Added Protonix.   #9 Hypothyroidism stable   #10 S/P Hip Replacement is requesting CIR but qualifies for SNF.     LOS: 4 days   [facility] 10/28/2011, 11:44 AM

## 2011-10-28 NOTE — Progress Notes (Signed)
Patient ID: [name], male   DOB: [birth year], 60 y.o.   MRN: [id number] [facility] Progress Note  Subjective: Mr. Maese is in good spirits today and obviously mentating normally, he has no complaints of abdominal pain or nausea, he has been having multiple bowel movements and asks if we can decrease the dose of lactulose. He has been up with physical therapy and says that he is ambulating better. He is concerned because his staples had been in his hip for 2 weeks and would like to see Dr. Magnus Ivan to have them removed. He is anticipating transferred to rehabilitation and is wondering about [facility] rehabilitation- for Monday.  Objective:  Vital signs in last 24 hours: Temp:  [97.8 F (36.6 C)-98.4 F (36.9 C)] 97.8 F (36.6 C) (11/30 0555) Pulse Rate:  [82-94] 89  (11/30 0555) Resp:  [20] 20  (11/30 0555) BP: (109-133)/(73-75) 133/74 mmHg (11/30 0555) SpO2:  [93 %-97 %] 93 % (11/30 0555) Last BM Date: 10/27/11 General:   Alert,  Well-developed,  obese white male up in the chair, in good spirits and mentating normally today Heart:  Regular rate and rhythm; no murmurs Abdomen:  Soft, nontender and nondistended. Normal bowel sounds, without guarding, and without rebound.    Neurologic:  Alert and  oriented x4;  grossly normal neurologically, no asterixis Psych:  Alert and cooperative. Normal mood and affect.  Intake/Output from previous day: 11/29 0701 - 11/30 0700 In: 920 [P.O.:720; IV Piggyback:200] Out: 2328 [Urine:2325; Stool:3] Intake/Output this shift: Total I/O In: 240 [P.O.:240] Out: 451 [Urine:450; Stool:1]  Lab Results:  [facility] 10/28/11 0545 10/27/11 0530  WBC 7.8 10.2  HGB 12.5* 11.9*  HCT 35.4* 34.7*  PLT 188 220   BMET  Basename 10/28/11 0545 10/27/11 0530  NA 131* 134*  K 3.8 4.5  CL 96 97  CO2 28 28  GLUCOSE 140* 117*  BUN 38* 43*  CREATININE 1.30 1.32  CALCIUM 8.8 9.5             Assessment / Plan: #68 60 year old male with  decompensated cirrhosis secondary to [place]. #2 Acute exacerbation of hepatic encephalopathy secondary to urinary tract infection and constipation aggravated by narcotic use . He is at this point back to his baseline per several days of significant confusion. #3  day #14 status post right hip replacement  Continue Xifaxan 550 mg by mouth twice daily he will need a prescription for this Continue Chronulac will change dose to 30 cc 3 times daily and would discharge on this dose. Continue 2 g sodium diet Continue Lasix and Aldactone at current doses, He will need GI follow up with Dr. Christella Hartigan in about a month after he completes rehabilitation and we will make this appointment for him GI will sign off please call for problems, thank you Principal Problem:  *Hepatic encephalopathy Active Problems:  AODM  Cirrhosis  Hyponatremia  Acute renal failure (ARF)  Enterococcus UTI  RLS (restless legs syndrome)  Reflux     LOS: 4 days   [name]  10/28/2011, 10:33 AM   AGREE. JNP,MD

## 2011-10-29 LAB — GLUCOSE, CAPILLARY

## 2011-10-29 MED ORDER — ASPIRIN EC 81 MG PO TBEC
81.0000 mg | DELAYED_RELEASE_TABLET | Freq: Every day | ORAL | Status: DC
  Administered 2011-10-30 – 2011-10-31 (×2): 81 mg via ORAL
  Filled 2011-10-29 (×2): qty 1

## 2011-10-29 NOTE — Progress Notes (Signed)
Subjective: Requesting Inpatient rehabilitation, patient is a lot more alert.   Objective: Vital signs in last 24 hours: Filed Vitals:   10/28/11 1351 10/28/11 2125 10/29/11 0606 10/29/11 1358  BP: 99/63 117/81 142/78 104/74  Pulse: 97 111 95 95  Temp: 97.9 F (36.6 C) 98.9 F (37.2 C) 98 F (36.7 C) 97.8 F (36.6 C)  TempSrc: Oral Oral Oral Oral  Resp: 20 20 20 18   Height:      Weight:      SpO2: 97% 96% 97% 100%    Intake/Output Summary (Last 24 hours) at 10/29/11 1647 Last data filed at 10/29/11 1426  Gross per 24 hour  Intake    480 ml  Output   1529 ml  Net  -1049 ml    Weight change:   General: Alert, awake, oriented x3, in no acute distress. HEENT: No bruits, no goiter. Heart: Regular rate and rhythm, without murmurs, rubs, gallops. Lungs: Clear to auscultation bilaterally. Abdomen: Soft, nontender, nondistended, positive bowel sounds. Extremities: No clubbing cyanosis or edema with positive pedal pulses. Neuro: Grossly intact, nonfocal.    Lab Results: Results for orders placed during the hospital encounter of 10/24/11 (from the past 24 hour(s))  GLUCOSE, CAPILLARY     Status: Abnormal   Collection Time   10/28/11  4:49 PM      Component Value Range   Glucose-Capillary 197 (*) 70 - 99 (mg/dL)  GLUCOSE, CAPILLARY     Status: Abnormal   Collection Time   10/28/11  8:25 PM      Component Value Range   Glucose-Capillary 292 (*) 70 - 99 (mg/dL)   Comment 1 Documented in Chart     Comment 2 Notify RN    GLUCOSE, CAPILLARY     Status: Abnormal   Collection Time   10/29/11  7:30 AM      Component Value Range   Glucose-Capillary 174 (*) 70 - 99 (mg/dL)   Comment 1 Notify RN    GLUCOSE, CAPILLARY     Status: Abnormal   Collection Time   10/29/11 11:52 AM      Component Value Range   Glucose-Capillary 265 (*) 70 - 99 (mg/dL)   Comment 1 Notify RN    GLUCOSE, CAPILLARY     Status: Abnormal   Collection Time   10/29/11  4:32 PM      Component Value Range    Glucose-Capillary 174 (*) 70 - 99 (mg/dL)   Comment 1 Notify RN       Micro: Recent Results (from the past 240 hour(s))  URINE CULTURE     Status: Normal   Collection Time   10/24/11 11:40 AM      Component Value Range Status Comment   Specimen Description URINE, RANDOM   Final    Special Requests NONE   Final    Setup Time [id number]   Final    Colony Count >=100,000 COLONIES/ML   Final    Culture ENTEROCOCCUS SPECIES   Final    Report Status 10/26/2011 FINAL   Final    Organism ID, Bacteria ENTEROCOCCUS SPECIES   Final     Studies/Results: No results found.  Medications:  Results for orders placed during the hospital encounter of 10/24/11 (from the past 24 hour(s))  GLUCOSE, CAPILLARY     Status: Abnormal   Collection Time   10/28/11  4:49 PM      Component Value Range   Glucose-Capillary 197 (*) 70 - 99 (mg/dL)  GLUCOSE, CAPILLARY  Status: Abnormal   Collection Time   10/28/11  8:25 PM      Component Value Range   Glucose-Capillary 292 (*) 70 - 99 (mg/dL)   Comment 1 Documented in Chart     Comment 2 Notify RN    GLUCOSE, CAPILLARY     Status: Abnormal   Collection Time   10/29/11  7:30 AM      Component Value Range   Glucose-Capillary 174 (*) 70 - 99 (mg/dL)   Comment 1 Notify RN    GLUCOSE, CAPILLARY     Status: Abnormal   Collection Time   10/29/11 11:52 AM      Component Value Range   Glucose-Capillary 265 (*) 70 - 99 (mg/dL)   Comment 1 Notify RN    GLUCOSE, CAPILLARY     Status: Abnormal   Collection Time   10/29/11  4:32 PM      Component Value Range   Glucose-Capillary 174 (*) 70 - 99 (mg/dL)   Comment 1 Notify RN       Assessment:  #1 Hepatic encephalopathy  Improving on lactulose and Rifaximin.  Active Problems:  #2 Acute renal failure (ARF)  Monitor renal function. Baseline creatinine is 1.21. (10/17/11).  #3 Enterococcus UTI  DC Unasyn. Change to Augmentin. #4 AODM  CBGs 174-265. Levemir increased to 40 units and meal coverage  added.  #5 Cirrhosis  LFTs stable. GI following. Continue Lasix and Aldactone. Continue lactulose. #6 Hyponatremia  Secondary to cirrhosis physiology. Sodium is improving.  #7 RLS  On requip.  #8 Reflux  on Protonix.   #9 Hypothyroidism stable   #10 S/P Hip Replacement is requesting CIR but qualifies for SNF.  Consulted rehabilitation to see patient is a candidate for CIR.     LOS: 5 days   Earlene Plater MD, [facility] 10/29/2011, 4:47 PM

## 2011-10-30 LAB — GLUCOSE, CAPILLARY
Glucose-Capillary: 259 mg/dL — ABNORMAL HIGH (ref 70–99)
Glucose-Capillary: 206 mg/dL — ABNORMAL HIGH (ref 70–99)

## 2011-10-30 MED ORDER — INSULIN DETEMIR 100 UNIT/ML ~~LOC~~ SOLN
42.0000 [IU] | Freq: Every day | SUBCUTANEOUS | Status: DC
  Administered 2011-10-30: 42 [IU] via SUBCUTANEOUS

## 2011-10-30 NOTE — Progress Notes (Signed)
Subjective: Patient feels as if he does not need any further rehabilitation. He wants to go home with physical therapy. Objective: Vital signs in last 24 hours: Filed Vitals:   10/29/11 1358 10/29/11 2200 10/30/11 0600 10/30/11 1351  BP: 104/74 118/69 116/64 113/69  Pulse: 95 86 90 91  Temp: 97.8 F (36.6 C) 99.1 F (37.3 C) 98.7 F (37.1 C) 97.8 F (36.6 C)  TempSrc: Oral Oral Oral Oral  Resp: 18 18 18 18   Height:      Weight:   115.268 kg (254 lb 1.9 oz)   SpO2: 100% 98% 100% 99%    Intake/Output Summary (Last 24 hours) at 10/30/11 1629 Last data filed at 10/30/11 1300  Gross per 24 hour  Intake      0 ml  Output   1227 ml  Net  -1227 ml    Weight change:   General: Alert, awake, oriented x3, in no acute distress. HEENT: No bruits, no goiter. Heart: Regular rate and rhythm, without murmurs, rubs, gallops. Lungs: Clear to auscultation bilaterally. Abdomen: Soft, nontender, nondistended, positive bowel sounds. Extremities: No clubbing cyanosis or edema with positive pedal pulses. Neuro: Grossly intact, nonfocal.    Lab Results: Results for orders placed during the hospital encounter of 10/24/11 (from the past 24 hour(s))  GLUCOSE, CAPILLARY     Status: Abnormal   Collection Time   10/29/11  4:32 PM      Component Value Range   Glucose-Capillary 174 (*) 70 - 99 (mg/dL)   Comment 1 Notify RN    GLUCOSE, CAPILLARY     Status: Abnormal   Collection Time   10/29/11  9:13 PM      Component Value Range   Glucose-Capillary 259 (*) 70 - 99 (mg/dL)  GLUCOSE, CAPILLARY     Status: Abnormal   Collection Time   10/30/11  7:39 AM      Component Value Range   Glucose-Capillary 206 (*) 70 - 99 (mg/dL)   Comment 1 Notify RN    GLUCOSE, CAPILLARY     Status: Abnormal   Collection Time   10/30/11 11:34 AM      Component Value Range   Glucose-Capillary 232 (*) 70 - 99 (mg/dL)   Comment 1 Notify RN       Micro: Recent Results (from the past 240 hour(s))  URINE CULTURE      Status: Normal   Collection Time   10/24/11 11:40 AM      Component Value Range Status Comment   Specimen Description URINE, RANDOM   Final    Special Requests NONE   Final    Setup Time [id number]   Final    Colony Count >=100,000 COLONIES/ML   Final    Culture ENTEROCOCCUS SPECIES   Final    Report Status 10/26/2011 FINAL   Final    Organism ID, Bacteria ENTEROCOCCUS SPECIES   Final     Studies/Results: No results found.  Medications:  Results for orders placed during the hospital encounter of 10/24/11 (from the past 24 hour(s))  GLUCOSE, CAPILLARY     Status: Abnormal   Collection Time   10/29/11  4:32 PM      Component Value Range   Glucose-Capillary 174 (*) 70 - 99 (mg/dL)   Comment 1 Notify RN    GLUCOSE, CAPILLARY     Status: Abnormal   Collection Time   10/29/11  9:13 PM      Component Value Range   Glucose-Capillary 259 (*) 70 -  99 (mg/dL)  GLUCOSE, CAPILLARY     Status: Abnormal   Collection Time   10/30/11  7:39 AM      Component Value Range   Glucose-Capillary 206 (*) 70 - 99 (mg/dL)   Comment 1 Notify RN    GLUCOSE, CAPILLARY     Status: Abnormal   Collection Time   10/30/11 11:34 AM      Component Value Range   Glucose-Capillary 232 (*) 70 - 99 (mg/dL)   Comment 1 Notify RN       Assessment:  #1 Hepatic encephalopathy  Resolve on lactulose and Rifaximin.  Active Problems:  #2 Acute renal failure (ARF)  Monitor renal function. Baseline creatinine is 1.21. (10/17/11).  #3 Enterococcus UTI  DC Unasyn. Changed to Augmentin. #4 AODM  CBGs 174-265. Levemir increased to 42 units and meal coverage added.  #5 Cirrhosis  LFTs stable. GI following. Continue Lasix and Aldactone. Continue lactulose. #6 Hyponatremia  Secondary to cirrhosis physiology. Sodium is improving.  #7 RLS  On requip.  #8 Reflux  on Protonix.   #9 Hypothyroidism stable   #10 S/P Hip Replacement is requesting CIR but qualifies for SNF.  Consulted rehabilitation to see patient is a  candidate for CIR. patient no longer wants to go to rehabilitation so will cancel that consultation will ask physical therapy to reevaluate patient to see if he is able to go home safely.     LOS: 6 days   Earlene Plater MD, [facility] 10/30/2011, 4:29 PM

## 2011-10-31 LAB — GLUCOSE, CAPILLARY: Glucose-Capillary: 151 mg/dL — ABNORMAL HIGH (ref 70–99)

## 2011-10-31 MED ORDER — AMOXICILLIN-POT CLAVULANATE 875-125 MG PO TABS: 1.0000 | ORAL_TABLET | Freq: Two times a day (BID) | ORAL | Status: AC

## 2011-10-31 MED ORDER — SPIRONOLACTONE 50 MG PO TABS: 50.0000 mg | ORAL_TABLET | Freq: Every day | ORAL | Status: DC

## 2011-10-31 MED ORDER — ROPINIROLE HCL 1 MG PO TABS: 1.0000 mg | ORAL_TABLET | Freq: Every day | ORAL | Status: DC

## 2011-10-31 MED ORDER — NYSTATIN 100000 UNIT/GM EX POWD: CUTANEOUS | Status: DC

## 2011-10-31 MED ORDER — RIFAXIMIN 550 MG PO TABS: 550.0000 mg | ORAL_TABLET | Freq: Two times a day (BID) | ORAL | Status: DC

## 2011-10-31 MED ORDER — INSULIN DETEMIR 100 UNIT/ML ~~LOC~~ SOLN: 40.0000 [IU] | Freq: Every day | SUBCUTANEOUS | Status: DC

## 2011-10-31 MED ORDER — PANTOPRAZOLE SODIUM 40 MG PO TBEC: 40.0000 mg | DELAYED_RELEASE_TABLET | Freq: Every day | ORAL | Status: DC

## 2011-10-31 MED ORDER — SPIRONOLACTONE 100 MG PO TABS: 100.0000 mg | ORAL_TABLET | Freq: Every day | ORAL | Status: DC

## 2011-10-31 MED ORDER — LACTULOSE 10 GM/15ML PO SOLN: 20.0000 g | ORAL | Status: DC

## 2011-10-31 MED ORDER — FUROSEMIDE 40 MG PO TABS: 40.0000 mg | ORAL_TABLET | Freq: Two times a day (BID) | ORAL | Status: DC

## 2011-10-31 MED ORDER — OXYCODONE HCL 5 MG PO CAPS: 5.0000 mg | ORAL_CAPSULE | ORAL | Status: DC | PRN

## 2011-10-31 MED ORDER — INSULIN ASPART 100 UNIT/ML ~~LOC~~ SOLN: 6.0000 [IU] | Freq: Three times a day (TID) | SUBCUTANEOUS | Status: DC

## 2011-10-31 NOTE — Progress Notes (Signed)
Patient discharge home with wife, alert and oriented, discharge orders given, patient verbalize understanding of discharge instructions, patient in stable condition at this time

## 2011-10-31 NOTE — Progress Notes (Signed)
Physical Therapy Treatment Patient Details Name: [name] MRN: [id number] DOB: 1951-05-20 Today's Date: 10/31/2011 8:58-10:06, 2gt, ta, te  PT Assessment/Plan  PT - Assessment/Plan Comments on Treatment Session: Pt wanting to go home with HHPT and not go to SNF.  Wife came in during treatment and observed pt getting in and out of bed, gait, and stairs.  Pt and wife with multiple questions about d/c and mobility.  Pt and wife taught safest techniques for mobility.  Pt had the most difficulty with bed mobility.  Wife reports she can help him with that if need be.  All questions answered to pat and wife's satsifaction.  Some questions deferred to MD and case manager. PT Plan: Discharge plan needs to be updated Follow Up Recommendations: Home health PT Equipment Recommended: None recommended by PT PT Goals  Acute Rehab PT Goals PT Goal Formulation: With patient PT Goal: Supine/Side to Sit - Progress: Met PT Goal: Ambulate - Progress: Progressing toward goal PT Goal: Perform Home Exercise Program - Progress: Progressing toward goal  PT Treatment Precautions/Restrictions  Precautions Precautions: Fall Precaution Comments: s/p recent Direct Anterior THR with no hip precautions Restrictions Weight Bearing Restrictions: No RLE Weight Bearing: Weight bearing as tolerated LLE Weight Bearing: Weight bearing as tolerated Mobility (including Balance) Bed Mobility Supine to Sit: With rails;5: Supervision (cues for for proper technique.  Pt performed twice.) Supine to Sit Details (indicate cue type and reason): Pt using L LE to A R LE.  Uses rail (pt has rails on bed at home).  50% verbal cues for how to get trunk up from flat bed. Sitting - Scoot to Edge of Bed: 6: Modified independent (Device/Increase time) Sit to Supine - Right: 4: Min assist;5: Supervision (MIN A for 1st rep, S for 2nd rep with use of sheet to A R LE) Sit to Supine - Right Details (indicate cue type and reason): A for LE on  1st rep.  Useing sheet to A B LE Transfers Sit to Stand: 5: Supervision Stand to Sit: 6: Modified independent (Device/Increase time) Ambulation/Gait Ambulation/Gait Assistance: 6: Modified independent (Device/Increase time) Ambulation/Gait Assistance Details (indicate cue type and reason): Pt adament about use of rollator.  Pt steps and applies handle brakes with each step.  Pt refuses to use RW. Ambulation Distance (Feet): 115 Feet Assistive device: Rollator Gait Pattern: Step-to pattern Gait velocity: R LE ER throughout all gait.  Pt reports pre-morbid. Stairs: Yes Stairs Assistance: 5: Supervision Stairs Assistance Details (indicate cue type and reason): pt with good recall of sequencing. Stair Management Technique: One rail Left;One rail Right;Forwards;With cane;Sideways (1st rep sideways with 1 rail, 2nd rep with 1 rail and 1 cane) Number of Stairs: 3  (2 reps)  Static Standing Balance Static Standing - Level of Assistance: 5: Stand by assistance Exercise  Total Joint Exercises Ankle Circles/Pumps: AROM;Both;15 reps Quad Sets: AROM;Strengthening;Right;10 reps Short Arc Quad: AROM;Right;10 reps Heel Slides: AAROM;10 reps;Right Long Arc Quad: AAROM;Right;5 reps End of Session PT - End of Session Activity Tolerance: Patient tolerated treatment well Patient left: in bed;with call bell in reach General Behavior During Session: [facility] for tasks performed Cognition: [facility] for tasks performed  [facility] LUBECK 10/31/2011, 10:25 AM

## 2011-10-31 NOTE — Progress Notes (Signed)
Pt and wife at bedside selected [facility] for Home PT. Referral given to [facility] in house rep. mp

## 2011-10-31 NOTE — Discharge Summary (Signed)
Physician Discharge Summary  Patient ID: [name] MRN: [id number] DOB/AGE: 03-03-1951 60 y.o.  Admit date: 10/24/2011 Discharge date: 10/31/2011  Discharge Diagnoses:   Hepatic encephalopathy  Reflux  AODM  Cirrhosis  Hyponatremia  Acute renal failure (ARF)  Enterococcus UTI  RLS (restless legs syndrome)   Current Discharge Medication List    START taking these medications   Details  amoxicillin-clavulanate (AUGMENTIN) 875-125 MG per tablet Take 1 tablet by mouth 2 (two) times daily. Qty: 10 tablet, Refills: 0    insulin aspart (NOVOLOG) 100 UNIT/ML injection Inject 6 Units into the skin 3 (three) times daily with meals. Qty: 1 vial, Refills: 1    pantoprazole (PROTONIX) 40 MG tablet Take 1 tablet (40 mg total) by mouth daily at 12 noon. Qty: 30 tablet, Refills: 0    rifaximin (XIFAXAN) 550 MG TABS Take 1 tablet (550 mg total) by mouth 2 (two) times daily. Qty: 60 tablet, Refills: 0    rOPINIRole (REQUIP) 1 MG tablet Take 1 tablet (1 mg total) by mouth at bedtime. Qty: 30 tablet, Refills: 0    spironolactone (ALDACTONE) 100 MG tablet Take 1 tablet (100 mg total) by mouth daily. Qty: 30 tablet, Refills: 0      CONTINUE these medications which have CHANGED   Details  furosemide (LASIX) 40 MG tablet Take 1 tablet (40 mg total) by mouth 2 (two) times daily. Qty: 60 tablet, Refills: 0    insulin detemir (LEVEMIR) 100 UNIT/ML injection Inject 40 Units into the skin at bedtime. Qty: 10 mL, Refills: 0    lactulose (CHRONULAC) 10 GM/15ML solution Take 30 mLs (20 g total) by mouth every 3 (three) hours. Until bowel movement, once patient has bowel movement three times daily thereafter Qty: 240 mL, Refills: 0    nystatin (NYSTOP) 100000 UNIT/GM POWD topically Qty: 30 g, Refills: 0    oxycodone (OXY-IR) 5 MG capsule Take 1 capsule (5 mg total) by mouth every 4 (four) hours as needed. PAIN  Qty: 30 capsule, Refills: 0      CONTINUE these medications which have NOT  CHANGED   Details  aspirin 81 MG tablet Take 81 mg by mouth every morning.     docusate sodium (COLACE) 100 MG capsule Take 100 mg by mouth 2 (two) times daily.      levothyroxine (SYNTHROID, LEVOTHROID) 50 MCG tablet Take 50 mcg by mouth every morning.     metoprolol succinate (TOPROL-XL) 25 MG 24 hr tablet 12.5 mg every morning.     Multiple Vitamin (MULTIVITAMIN) tablet Take 1 tablet by mouth daily.      lidocaine (LIDODERM) 5 % Place 1 patch onto the skin daily as needed. Remove & Discard patch within 12 hours or as directed by MD..PAIN    nitroGLYCERIN (NITROSTAT) 0.4 MG SL tablet Place 0.4 mg under the tongue every 5 (five) minutes as needed. CHEST PAIN      STOP taking these medications     methocarbamol (ROBAXIN) 500 MG tablet         Discharge Orders    Future Appointments: Provider: Department: Dept Phone: Center:   12/02/2011 8:45 AM Rob Bunting, MD [facility] [place] Office [phone] [facility]   12/27/2011 9:00 AM Micheline Chapman, MD [facility] [place] [phone] [facility]   08/22/2012 1:30 PM Barbaraann Share, MD [facility] [phone] None     Future Orders Please Complete By Expires   Ambulatory referral to Home Health      Comments:   Please evaluate  [name] for admission to [facility].  Disciplines requested: Physical therapy  Services to provide: Physical therapy  Physician to follow patient's care : Primary Care Doctor responsible for signing ongoing orders):  Requested Start of Care Date:11/01/11  Please evaluate for a lift to help with getting out of bed.   Diet - low sodium heart healthy      Increase activity slowly      Discharge instructions         Follow-up Information    Follow up with Rob Bunting, MD in 1 month.   Contact information:   [address] [place] [address] [phone]          Disposition: Skilled Nursing Facility  Discharged Condition:   Full Code   Consults:   Gastroenterology  HPI:  History of present illness [name] is a 60 year old Caucasian male who was recently discharged on November 16. Following hip replacement. He was discharged to a nursing home for recovery and physical therapy and was doing okay up until Sunday when he became confused. Wife called his gastroenterologist, who increased his lactulose, but his confusion got worse and he was transferred to [facility] for further evaluation. Patient was unable to give history. History is obtained from ER chart and speaking with the wife over the phone. He apparently had a similar bout in June where he became confused and had the worsening of his hepatorenal syndrome. However, he improved from that and had got along okay then in October had a heart attack and had 3 stents placed 6 weeks post. The stents placement. He had his head surgery after being cleared by his cardiologist. [name] has multiple medical problems including nonalcoholic cirrhosis, chronic renal insufficiency, morbid obesity, diabetes, coronary artery disease, hypertension, arthritis, and gout   PMH:  As per H & P FH: As per  H & P SH: As per H & P Med: as per H & P Allergies and ROS: as per H&P   Discharge Exam: Blood pressure 86/59, pulse 83, temperature 97.5 F (36.4 C), temperature source Oral, resp. rate 16, height 5\' 9"  (1.753 m), weight 117.073 kg (258 lb 1.6 oz), SpO2 100.00%.  General: Alert, awake, oriented x3, in no acute distress.  HEENT: No bruits, no goiter.  Heart: Regular rate and rhythm, without murmurs, rubs, gallops.  Lungs: Clear to auscultation bilaterally.  Abdomen: Soft, nontender, nondistended, positive bowel sounds.  Extremities: No clubbing cyanosis or edema with positive pedal pulses.  Neuro: Grossly intact, nonfocal.  Hospital Course:  Hepatic encephalopathy Patient was admitted with hepatic encephalopathy. GI the bar was consult and and assisted with the management of the patient. He  was given spironolactone and Lasix along with lactulose and rifaximin he improved over the course of the hospitalization. He is not back to baseline.  His blood pressure was running in the 80's systolic so will decrease his spironolactone dose. He will be discharged home with physical therapy.  He will follow up with Dr. Christella Hartigan in 4 weeks.   Reflux Started patient on protonix.  Symptoms are controlled on protonix.   AODM Patient's blood sugar was elevated. Levemir has been increased to 40 units and he is now on regular insulin with each meal. He will follow up with his PCP for further management.   Cirrhosis As mentioned above patient had hepatic encephalopathy that is now resolved he will follow up outpatient with GI.   Hyponatremia This is  a chronic problem secondary to his cirrhosis and medication.   Acute renal failure (ARF) Resolved   Enterococcus UTI Patient will be discharged on Augmentin for 5 more days.   RLS (restless legs syndrome) Patient was started on Requip  Labs:   Results for orders placed during the hospital encounter of 10/24/11 (from the past 48 hour(s))  GLUCOSE, CAPILLARY     Status: Abnormal   Collection Time   10/29/11  4:32 PM      Component Value Range Comment   Glucose-Capillary 174 (*) 70 - 99 (mg/dL)    Comment 1 Notify RN     GLUCOSE, CAPILLARY     Status: Abnormal   Collection Time   10/29/11  9:13 PM      Component Value Range Comment   Glucose-Capillary 259 (*) 70 - 99 (mg/dL)   GLUCOSE, CAPILLARY     Status: Abnormal   Collection Time   10/30/11  7:39 AM      Component Value Range Comment   Glucose-Capillary 206 (*) 70 - 99 (mg/dL)    Comment 1 Notify RN     GLUCOSE, CAPILLARY     Status: Abnormal   Collection Time   10/30/11 11:34 AM      Component Value Range Comment   Glucose-Capillary 232 (*) 70 - 99 (mg/dL)    Comment 1 Notify RN     GLUCOSE, CAPILLARY     Status: Abnormal   Collection Time   10/30/11  4:18 PM      Component Value  Range Comment   Glucose-Capillary 200 (*) 70 - 99 (mg/dL)    Comment 1 Notify RN     GLUCOSE, CAPILLARY     Status: Abnormal   Collection Time   10/30/11  9:49 PM      Component Value Range Comment   Glucose-Capillary 221 (*) 70 - 99 (mg/dL)   GLUCOSE, CAPILLARY     Status: Abnormal   Collection Time   10/31/11  7:22 AM      Component Value Range Comment   Glucose-Capillary 151 (*) 70 - 99 (mg/dL)    Comment 1 Notify RN     GLUCOSE, CAPILLARY     Status: Abnormal   Collection Time   10/31/11 11:26 AM      Component Value Range Comment   Glucose-Capillary 186 (*) 70 - 99 (mg/dL)    Comment 1 Notify RN       Diagnostics:  Dg Chest 2 View  10/16/2011  *RADIOLOGY REPORT*  Clinical Data: Shortness of breath post hip surgery  CHEST - 2 VIEW  Comparison: 10/15/2011  Findings: Heart size upper limits normal.  Lungs are clear.  No effusion.  Regional bones unremarkable.  IMPRESSION:  Borderline cardiomegaly.  No acute disease.  Original Report Authenticated By: Osa Craver, M.D.   Dg Chest 2 View  10/07/2011  *RADIOLOGY REPORT*  Clinical Data: Preoperative evaluation.  CHEST - 2 VIEW  Comparison: 08/28/2011.  Findings: There is moderate enlargement cardiac silhouette.  Lungs are free of infiltrates.  No pleural effusions are seen.  Bones appear average for age.  There is minimal degenerative spondylosis.  IMPRESSION: No acute or active cardiopulmonary or pleural abnormalities are seen.  Original Report Authenticated By: Crawford Givens, M.D.   Dg Hip Complete Right  10/14/2011  *RADIOLOGY REPORT*  Clinical Data: Severe osteoarthritis of the right hip.  RIGHT HIP - COMPLETE 2+ VIEW  Comparison: Radiographs dated 11/28/2010  Findings: AP C-arm images demonstrate the  patient has undergone a right total hip prosthesis insertion.  The prosthesis appears in excellent position in the AP projection.  No fractures.  IMPRESSION: Satisfactory appearance of the right hip after right total hip prosthesis  insertion.  Original Report Authenticated By: Gwynn Burly, M.D.   Dg Pelvis Portable  10/14/2011  *RADIOLOGY REPORT*  Clinical Data: Severe osteoarthritis of the right hip.  Right total hip replacement.  PORTABLE PELVIS  Comparison: CT scan dated 05/28/2011  Findings: Right hip prosthesis appears in good position in the AP projection.  No fracture.  Normal appearing left hip.  IMPRESSION: Satisfactory appearance of the right hip after total hip replacement.  Original Report Authenticated By: Gwynn Burly, M.D.   Dg Chest Port 1 View  10/15/2011  *RADIOLOGY REPORT*  Clinical Data: Congestive heart failure.  Coronary artery disease. Postoperative for hip replacement.  PORTABLE CHEST - 1 VIEW  Comparison: 10/07/2011  Findings: Low lung volumes are present, causing crowding of the pulmonary vasculature.  Cardiac and mediastinal contours appear unremarkable.  The lungs appear clear.  No pleural effusion is observed.  IMPRESSION:  1.  No specific radiographic abnormality is observed.  Original Report Authenticated By: Dellia Cloud, M.D.   Dg Hip Portable 1 View Right  10/14/2011  *RADIOLOGY REPORT*  Clinical Data: Osteoarthritis of the right hip.  PORTABLE RIGHT HIP - 1 VIEW  Comparison: Radiographs dated 10/14/2011  Findings: Lateral portable view of the right hip demonstrates the acetabular and femoral components of the total hip prosthesis to be in excellent position.  IMPRESSION: Satisfactory appearance in the lateral view of the right hip after total hip replacement.  Original Report Authenticated By: Gwynn Burly, M.D.   Dg Abd Acute W/chest  10/24/2011  *RADIOLOGY REPORT*  Clinical Data: Abdominal pain, altered mental status  ACUTE ABDOMEN SERIES (ABDOMEN 2 VIEW & CHEST 1 VIEW)  Comparison: 10/16/2011, 05/28/2011  Findings: Decreased lung volumes noted.  Negative for CHF, pneumonia, effusion or pneumothorax.  Stable heart size and vascularity.  No free air.  Scattered air and  stool throughout the bowel.  Nonspecific air- fluid levels on the decubitus view but negative for free air. Right hip arthroplasty noted.  IMPRESSION: No acute finding by plain radiography.  Original Report Authenticated By: Judie Petit. Ruel Favors, M.D.   Dg C-arm 61-120 Min-no Report  10/14/2011  CLINICAL DATA: severe osteoarthritis right hip   C-ARM 61-120 MINUTES  Fluoroscopy was utilized by the requesting physician.  No radiographic  interpretation.       SignedEarlene Plater MD, [place] 10/31/2011, 3:19 PM

## 2011-11-03 ENCOUNTER — Telehealth: Payer: Self-pay | Admitting: Gastroenterology

## 2011-11-03 DIAGNOSIS — K7460 Unspecified cirrhosis of liver: Secondary | ICD-10-CM

## 2011-11-03 NOTE — Telephone Encounter (Signed)
Pt was discharged from the hospital and the hospitalist changed his furosemide to 40 mg twice daily and the spironolactone to 50 mg daily.  He starting to have edema again and wants to know if he can change his dosages back to what Dr Christella Hartigan prescribed.  Please advise

## 2011-11-04 NOTE — Telephone Encounter (Signed)
Aldactone should be 100 twice daily.  Lasix should be 80 twice daily.  If he needs any scripts, should be one month of each with one refill each.  rov with me in 1-2 weeks, needs bmet next week

## 2011-11-04 NOTE — Telephone Encounter (Signed)
Pt aware and will call if and when he needs refills.  He wants to have his labs done at the office where his wife works and they will fax results.  ROC scheduled.

## 2011-11-10 ENCOUNTER — Other Ambulatory Visit (INDEPENDENT_AMBULATORY_CARE_PROVIDER_SITE_OTHER): Payer: 59

## 2011-11-10 ENCOUNTER — Ambulatory Visit (INDEPENDENT_AMBULATORY_CARE_PROVIDER_SITE_OTHER): Payer: 59 | Admitting: Nurse Practitioner

## 2011-11-10 ENCOUNTER — Telehealth: Payer: Self-pay | Admitting: Gastroenterology

## 2011-11-10 ENCOUNTER — Encounter: Payer: Self-pay | Admitting: Nurse Practitioner

## 2011-11-10 VITALS — BP 90/48 | HR 72

## 2011-11-10 DIAGNOSIS — K7460 Unspecified cirrhosis of liver: Secondary | ICD-10-CM

## 2011-11-10 DIAGNOSIS — R609 Edema, unspecified: Secondary | ICD-10-CM

## 2011-11-10 LAB — BASIC METABOLIC PANEL
GFR: 55.62 mL/min — ABNORMAL LOW (ref >60.00)
Glucose, Bld: 172 mg/dL — ABNORMAL HIGH (ref 70–99)
Potassium: 5.1 mEq/L (ref 3.5–5.1)
Sodium: 132 mEq/L — ABNORMAL LOW (ref 135–145)

## 2011-11-10 MED ORDER — SPIRONOLACTONE 50 MG PO TABS: ORAL_TABLET | ORAL | Status: DC

## 2011-11-10 MED ORDER — FUROSEMIDE 40 MG PO TABS: ORAL_TABLET | ORAL | Status: DC

## 2011-11-10 NOTE — Progress Notes (Signed)
[name] [id number] 1951-04-07   HISTORY OR PRESENT ILLNESS : [name] is a 60 year old male followed by Dr. Christella Hartigan for a history of [name] cirrhosis. Patient was hospitalized in mid November with hepatic cephalopathy and blood clot. His diuretic regimen was altered upon discharge and apparently about one half of what he was taking prior to admission. Patient called the office 11/03/11 with complaints of edema. We increased his Aldactone back to 100 mg twice daily and his Lasix back to 80 mg twice daily. Since increaseing diuretics a few days ago he hasn't noticed much improvement in swelling. Breathing okay. Mainly complains of lower extremity edema. Patient's wife works two jobs, is difficult for the patient to adhere to low-sodium diet. No abdominal pain. Patient has a basic metabolic profile pending. He is accompanied by his wife today.  Current Medications, Allergies, Past Medical History, Past Surgical History, Family History and Social History were reviewed in [employer] record.   PHYSICAL EXAMINATION : General: Obese, pale white male in no acute distress.  Head: Normocephalic and atraumatic Eyes:  sclerae anicteric,conjunctive light pink Ears: Normal auditory acuity Lungs: Clear throughout to auscultation Heart: Regular rate and rhythm Abdomen: Obese, not significantly distended. Nontender. Positive bowel sounds.  Rectal: not done.  Musculoskeletal: Symmetrical with no gross deformities  Skin: No lesions on visible extremities Extremities: Pitting edema of bilateral lower extremities.  Neurological: Alert oriented x 4, grossly nonfocal. No asterixis.  Cervical Nodes:  No significant cervical adenopathy Psychological:  Alert and cooperative. Normal mood and affect  ASSESSMENT AND PLAN :

## 2011-11-10 NOTE — Telephone Encounter (Signed)
Yes or available MD spot, he should have bmet 1-2 hours prior, stat, so that she can advise on diuretic doses.

## 2011-11-10 NOTE — Telephone Encounter (Signed)
Pt will be in today for appt and labs

## 2011-11-10 NOTE — Patient Instructions (Signed)
We have put an order in for [facility] and they will call you with an appointment.    We have given you low salt diet information. Once the lab results are available, Gunnar Fusi will let you know what to do regarding the medications.

## 2011-11-10 NOTE — Telephone Encounter (Signed)
Pt complains that the edema is getting worse even after increasing his meds.  His incision from his hip surgery is very "tight"  He says he has gained about 10 pounds.  Should I put him in on Paula's schedule?

## 2011-11-11 ENCOUNTER — Encounter: Payer: Self-pay | Admitting: Nurse Practitioner

## 2011-11-11 NOTE — Assessment & Plan Note (Signed)
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 Patient seen by Dr. Christella Hartigan in November. Since that time he was hospitalized with hepatic encephalopathy and acute renal failure. On Lactulose without signs of encephalopathy on exam today. His diuretics were decreased upon hospital discharge, probably secondary to acute renal failure. Now with peripheral edema, increasing abdominal distention. Unfortunately patient hasn't been able to adhere to low sodium diet because his wife works two jobs and preparing meals is problematic from standpoint of time.  I gave them information / sample diets for low sodium foods. In addition, will refer him and wife to dietician at [facility]. His BUN and creatinine have improved since discharge, 35 and 1.4 respectively. His  Aldactone can be increased from 100mg  BID to 150mg  BID. Lasix is already at 160mg  daily so will hold there for now. Repeat BMET in a week. He needs to keep scheduled visit with Dr. Christella Hartigan on 11/23/11.

## 2011-11-14 NOTE — Progress Notes (Signed)
I AGREE WITH ASSESSMENT AND PLANS

## 2011-11-18 ENCOUNTER — Encounter (HOSPITAL_COMMUNITY): Payer: Self-pay | Admitting: Family Medicine

## 2011-11-18 ENCOUNTER — Inpatient Hospital Stay (HOSPITAL_COMMUNITY)
Admission: EM | Admit: 2011-11-18 | Discharge: 2011-11-21 | DRG: 443 | Disposition: A | Discharge status: Home / Self Care | Payer: 59 | Attending: Internal Medicine | Admitting: Internal Medicine

## 2011-11-18 DIAGNOSIS — K219 Gastro-esophageal reflux disease without esophagitis: Secondary | ICD-10-CM | POA: Diagnosis present

## 2011-11-18 DIAGNOSIS — I509 Heart failure, unspecified: Secondary | ICD-10-CM | POA: Diagnosis present

## 2011-11-18 DIAGNOSIS — I2510 Atherosclerotic heart disease of native coronary artery without angina pectoris: Secondary | ICD-10-CM | POA: Diagnosis present

## 2011-11-18 DIAGNOSIS — M545 Low back pain, unspecified: Secondary | ICD-10-CM | POA: Diagnosis present

## 2011-11-18 DIAGNOSIS — Z96649 Presence of unspecified artificial hip joint: Secondary | ICD-10-CM

## 2011-11-18 DIAGNOSIS — D6959 Other secondary thrombocytopenia: Secondary | ICD-10-CM | POA: Diagnosis present

## 2011-11-18 DIAGNOSIS — R5381 Other malaise: Secondary | ICD-10-CM | POA: Diagnosis present

## 2011-11-18 DIAGNOSIS — K7682 Hepatic encephalopathy: Principal | ICD-10-CM | POA: Diagnosis present

## 2011-11-18 DIAGNOSIS — Z96659 Presence of unspecified artificial knee joint: Secondary | ICD-10-CM

## 2011-11-18 DIAGNOSIS — Z794 Long term (current) use of insulin: Secondary | ICD-10-CM

## 2011-11-18 DIAGNOSIS — N289 Disorder of kidney and ureter, unspecified: Secondary | ICD-10-CM | POA: Diagnosis present

## 2011-11-18 DIAGNOSIS — I10 Essential (primary) hypertension: Secondary | ICD-10-CM | POA: Diagnosis present

## 2011-11-18 DIAGNOSIS — N183 Chronic kidney disease, stage 3 unspecified: Secondary | ICD-10-CM | POA: Diagnosis present

## 2011-11-18 DIAGNOSIS — G4733 Obstructive sleep apnea (adult) (pediatric): Secondary | ICD-10-CM | POA: Diagnosis present

## 2011-11-18 DIAGNOSIS — I129 Hypertensive chronic kidney disease with stage 1 through stage 4 chronic kidney disease, or unspecified chronic kidney disease: Secondary | ICD-10-CM | POA: Diagnosis present

## 2011-11-18 DIAGNOSIS — K7290 Hepatic failure, unspecified without coma: Principal | ICD-10-CM

## 2011-11-18 DIAGNOSIS — E079 Disorder of thyroid, unspecified: Secondary | ICD-10-CM | POA: Diagnosis present

## 2011-11-18 DIAGNOSIS — E119 Type 2 diabetes mellitus without complications: Secondary | ICD-10-CM

## 2011-11-18 DIAGNOSIS — E785 Hyperlipidemia, unspecified: Secondary | ICD-10-CM | POA: Diagnosis present

## 2011-11-18 DIAGNOSIS — N189 Chronic kidney disease, unspecified: Secondary | ICD-10-CM | POA: Diagnosis present

## 2011-11-18 DIAGNOSIS — I252 Old myocardial infarction: Secondary | ICD-10-CM

## 2011-11-18 DIAGNOSIS — K7460 Unspecified cirrhosis of liver: Secondary | ICD-10-CM | POA: Diagnosis present

## 2011-11-18 DIAGNOSIS — K7689 Other specified diseases of liver: Secondary | ICD-10-CM | POA: Diagnosis present

## 2011-11-18 LAB — URINALYSIS, ROUTINE W REFLEX MICROSCOPIC
Bilirubin Urine: NEGATIVE
Ketones, ur: NEGATIVE mg/dL
Nitrite: NEGATIVE
Urobilinogen, UA: 1 mg/dL (ref 0.0–1.0)

## 2011-11-18 LAB — COMPREHENSIVE METABOLIC PANEL
Alkaline Phosphatase: 105 U/L (ref 39–117)
BUN: 45 mg/dL — ABNORMAL HIGH (ref 6–23)
Chloride: 95 mEq/L — ABNORMAL LOW (ref 96–112)
GFR calc Af Amer: 55 mL/min — ABNORMAL LOW (ref >90)
GFR calc non Af Amer: 48 mL/min — ABNORMAL LOW (ref >90)
Glucose, Bld: 155 mg/dL — ABNORMAL HIGH (ref 70–99)
Potassium: 4.9 mEq/L (ref 3.5–5.1)
Total Bilirubin: 1.4 mg/dL — ABNORMAL HIGH (ref 0.3–1.2)

## 2011-11-18 LAB — CBC
HCT: 33.5 % — ABNORMAL LOW (ref 39.0–52.0)
Hemoglobin: 11.9 g/dL — ABNORMAL LOW (ref 13.0–17.0)
Hemoglobin: 11.7 g/dL — ABNORMAL LOW (ref 13.0–17.0)
MCH: 31.7 pg (ref 26.0–34.0)
MCHC: 35.5 g/dL (ref 30.0–36.0)
MCV: 91.1 fL (ref 78.0–100.0)
RBC: 3.69 MIL/uL — ABNORMAL LOW (ref 4.22–5.81)

## 2011-11-18 LAB — AMMONIA: Ammonia: 187 umol/L — ABNORMAL HIGH (ref 11–60)

## 2011-11-18 MED ORDER — LACTULOSE 10 GM/15ML PO SOLN
30.0000 g | Freq: Once | ORAL | Status: AC
  Administered 2011-11-18: 30 g via ORAL
  Filled 2011-11-18: qty 45

## 2011-11-18 MED ORDER — LEVOTHYROXINE SODIUM 50 MCG PO TABS
50.0000 ug | ORAL_TABLET | ORAL | Status: DC
  Administered 2011-11-18 – 2011-11-21 (×4): 50 ug via ORAL
  Filled 2011-11-18 (×7): qty 1

## 2011-11-18 MED ORDER — ASPIRIN EC 81 MG PO TBEC
81.0000 mg | DELAYED_RELEASE_TABLET | ORAL | Status: DC
  Administered 2011-11-18 – 2011-11-21 (×4): 81 mg via ORAL
  Filled 2011-11-18 (×7): qty 1

## 2011-11-18 MED ORDER — LACTULOSE 10 GM/15ML PO SOLN
30.0000 g | Freq: Two times a day (BID) | ORAL | Status: DC
  Administered 2011-11-19: 30 g via ORAL
  Filled 2011-11-18 (×2): qty 45

## 2011-11-18 MED ORDER — LACTULOSE 10 GM/15ML PO SOLN
30.0000 g | Freq: Three times a day (TID) | ORAL | Status: DC
  Administered 2011-11-18: 20 g via ORAL
  Filled 2011-11-18 (×5): qty 45

## 2011-11-18 MED ORDER — INSULIN DETEMIR 100 UNIT/ML ~~LOC~~ SOLN
40.0000 [IU] | Freq: Every day | SUBCUTANEOUS | Status: DC
  Administered 2011-11-18 – 2011-11-20 (×3): 40 [IU] via SUBCUTANEOUS
  Filled 2011-11-18: qty 3

## 2011-11-18 MED ORDER — IBUPROFEN 800 MG PO TABS
800.0000 mg | ORAL_TABLET | Freq: Once | ORAL | Status: DC
  Filled 2011-11-18: qty 1

## 2011-11-18 MED ORDER — NITROGLYCERIN 0.4 MG SL SUBL: 0.4000 mg | SUBLINGUAL_TABLET | SUBLINGUAL | Status: DC | PRN

## 2011-11-18 MED ORDER — SODIUM CHLORIDE 0.9 % IV SOLN
Freq: Once | INTRAVENOUS | Status: AC
  Administered 2011-11-18: 02:00:00 via INTRAVENOUS

## 2011-11-18 MED ORDER — METOPROLOL SUCCINATE 12.5 MG HALF TABLET
12.5000 mg | ORAL_TABLET | ORAL | Status: DC
  Administered 2011-11-19 – 2011-11-21 (×3): 12.5 mg via ORAL
  Filled 2011-11-18 (×6): qty 1

## 2011-11-18 MED ORDER — DOCUSATE SODIUM 100 MG PO CAPS
100.0000 mg | ORAL_CAPSULE | Freq: Two times a day (BID) | ORAL | Status: DC
  Administered 2011-11-18 – 2011-11-21 (×5): 100 mg via ORAL
  Filled 2011-11-18 (×9): qty 1

## 2011-11-18 MED ORDER — PANTOPRAZOLE SODIUM 40 MG PO TBEC
40.0000 mg | DELAYED_RELEASE_TABLET | Freq: Every day | ORAL | Status: DC
  Administered 2011-11-18 – 2011-11-21 (×4): 40 mg via ORAL
  Filled 2011-11-18 (×5): qty 1

## 2011-11-18 MED ORDER — MORPHINE SULFATE 2 MG/ML IJ SOLN
1.0000 mg | Freq: Once | INTRAMUSCULAR | Status: AC
  Administered 2011-11-18: 2 mg via INTRAVENOUS
  Filled 2011-11-18: qty 1

## 2011-11-18 MED ORDER — ENOXAPARIN SODIUM 40 MG/0.4ML ~~LOC~~ SOLN
40.0000 mg | SUBCUTANEOUS | Status: DC
  Administered 2011-11-18: 40 mg via SUBCUTANEOUS
  Filled 2011-11-18 (×2): qty 0.4

## 2011-11-18 MED ORDER — MORPHINE SULFATE 2 MG/ML IJ SOLN
1.0000 mg | Freq: Four times a day (QID) | INTRAMUSCULAR | Status: DC | PRN
  Administered 2011-11-18: 2 mg via INTRAVENOUS
  Administered 2011-11-18 – 2011-11-21 (×8): 1 mg via INTRAVENOUS
  Filled 2011-11-18 (×9): qty 1

## 2011-11-18 MED ORDER — IBUPROFEN 200 MG PO TABS: 200.0000 mg | ORAL_TABLET | Freq: Once | ORAL | Status: DC

## 2011-11-18 MED ORDER — MORPHINE SULFATE 2 MG/ML IJ SOLN
INTRAMUSCULAR | Status: AC
  Administered 2011-11-18: 2 mg via INTRAVENOUS
  Filled 2011-11-18: qty 1

## 2011-11-18 MED ORDER — FUROSEMIDE 40 MG PO TABS
40.0000 mg | ORAL_TABLET | Freq: Two times a day (BID) | ORAL | Status: DC
  Administered 2011-11-18 – 2011-11-21 (×6): 40 mg via ORAL
  Filled 2011-11-18 (×9): qty 1

## 2011-11-18 MED ORDER — RIFAXIMIN 550 MG PO TABS
550.0000 mg | ORAL_TABLET | Freq: Three times a day (TID) | ORAL | Status: DC
  Administered 2011-11-18 – 2011-11-21 (×9): 550 mg via ORAL
  Filled 2011-11-18 (×13): qty 1

## 2011-11-18 MED ORDER — RIFAXIMIN 550 MG PO TABS
550.0000 mg | ORAL_TABLET | Freq: Two times a day (BID) | ORAL | Status: DC
  Administered 2011-11-18: 550 mg via ORAL
  Filled 2011-11-18 (×4): qty 1

## 2011-11-18 NOTE — H&P (Signed)
PCP:   REED,TIFFANY L., DO  GI; Dr. Dr[facility]  Chief Complaint:  Altered mental status  HPI: This a 60 year old male with known history of liver cirrhosis on lactulose. Per wife she noted today he has some extra twitching. Checks and if she was taking his lactulose he said yes but he was having 1 BM a day. Apparently his gastroenterologist suggest he have at least 2 daily, he stated he didn't increase the dose. Tonight approximately 12:30 AM the wife woke to the patient without mental status and increased twitching. 911 was called, he was brought to the ER. Patient's ammonia level is 187. History obtained from the wife.  Review of Systems:  Unable to assess due to patient's mentation.  Past Medical History: Past Medical History  Diagnosis Date  . Hypertension   . Type II or unspecified type diabetes mellitus without mention of complication, not stated as uncontrolled   . Thyroid disease   . Cirrhosis, non-alcoholic   . Morbid obesity   . Pancytopenia   . Fatty liver   . Coronary artery disease   . Myocardial infarction     "HEART INCIDENT " OCT 2012  . Angina     RARE USE OF NTG  . CHF (congestive heart failure)     JUNE 2012  . Arthritis   . Gout 35 YRS AGO  . Cellulitis of left leg   . Cirrhosis of liver     NONALCOHOLIC  . Nocturia   . Elevated BUN   . Elevated serum creatinine   . Serum ammonia increased   . Splenomegaly   . Platelets decreased   . Thyroid disorder     PT DOES NOT KNOW IF LOW OR HIGH  . Obstructive sleep apnea     USES O2 WITH C - PAP SETTING FOR C-PA IS 16  . Chronic kidney disease (CKD), stage III (moderate)    Past Surgical History  Procedure Date  . Coronary stent placement     2 CARDIAC STENTS OCT 08/2011  . Total knee arthroplasty 10/14/2011  . Total hip arthroplasty 10/14/2011    Procedure: TOTAL HIP ARTHROPLASTY ANTERIOR APPROACH;  Surgeon: Kathryne Hitch;  Location: [facility];  Service: Orthopedics;  Laterality: Right;    . Joint replacement   . Appendectomy 1968  . Tonsillectomy 1957  . Cardiac catheterization 09/08/2011    Medications: Prior to Admission medications   Medication Sig Start Date End Date Taking? Authorizing Provider  aspirin 81 MG tablet Take 81 mg by mouth every morning.    Yes Historical Provider, MD  docusate sodium (COLACE) 100 MG capsule Take 100 mg by mouth 2 (two) times daily.     Yes Historical Provider, MD  furosemide (LASIX) 40 MG tablet Take 2 (40 mg) tablets twice daily. 90 day supply 11/10/11  Yes Meredith Pel, NP  insulin aspart (NOVOLOG) 100 UNIT/ML injection Inject 6 Units into the skin 3 (three) times daily with meals. 10/31/11 10/30/12 Yes Novlet Jarrett-Davis  insulin detemir (LEVEMIR) 100 UNIT/ML injection Inject 40 Units into the skin at bedtime. 10/31/11  Yes Novlet Jarrett-Davis  lactulose (CHRONULAC) 10 GM/15ML solution Take 30 mLs (20 g total) by mouth every 3 (three) hours. Until bowel movement, once patient has bowel movement three times daily thereafter 10/31/11  Yes Novlet Jarrett-Davis  levothyroxine (SYNTHROID, LEVOTHROID) 50 MCG tablet Take 50 mcg by mouth every morning.    Yes Historical Provider, MD  lidocaine (LIDODERM) 5 % Place 1 patch onto the  skin daily as needed. Remove & Discard patch within 12 hours or as directed by MD..PAIN   Yes Historical Provider, MD  metoprolol succinate (TOPROL-XL) 25 MG 24 hr tablet 12.5 mg every morning.    Yes Historical Provider, MD  Multiple Vitamin (MULTIVITAMIN) tablet Take 1 tablet by mouth daily.     Yes Historical Provider, MD  nystatin (NYSTOP) 100000 UNIT/GM POWD topically 10/31/11  Yes Novlet Jarrett-Davis  oxycodone (OXY-IR) 5 MG capsule Take 1 capsule (5 mg total) by mouth every 4 (four) hours as needed. PAIN  10/31/11  Yes Novlet Jarrett-Davis  pantoprazole (PROTONIX) 40 MG tablet Take 1 tablet (40 mg total) by mouth daily at 12 noon. 10/31/11 10/30/12 Yes Novlet Jarrett-Davis  rifaximin (XIFAXAN) 550 MG TABS Take  1 tablet (550 mg total) by mouth 2 (two) times daily. 10/31/11  Yes Novlet Jarrett-Davis  rOPINIRole (REQUIP) 1 MG tablet Take 1 tablet (1 mg total) by mouth at bedtime. 10/31/11 10/30/12 Yes Novlet Jarrett-Davis  spironolactone (ALDACTONE) 50 MG tablet Take 3 (50 mg) tablets in the AM and 3 (50 MG) tab in the PM. 90 day supply: 540 tablets. 11/10/11  Yes Meredith Pel, NP  nitroGLYCERIN (NITROSTAT) 0.4 MG SL tablet Place 0.4 mg under the tongue every 5 (five) minutes as needed. CHEST PAIN    Historical Provider, MD    Allergies:   Allergies  Allergen Reactions  . Crestor (Rosuvastatin Calcium)     unknown  . Statins Other (See Comments)    PT DOES NOT REMEMBER    Social History:  reports that he has never smoked. He has never used smokeless tobacco. He reports that he does not drink alcohol or use illicit drugs. uses CPAP machine at night along with oxygen. His and his wife. Uses a walker.  Family History: Family History  Problem Relation Age of Onset  . Colon cancer Neg Hx   . Diabetes Mother   . Diabetes Father     Physical Exam: Filed Vitals:   11/18/11 0200 11/18/11 0215 11/18/11 0230 11/18/11 0348  BP:    129/77  Pulse: 80 84 85 100  Temp:    98.4 F (36.9 C)  TempSrc:    Oral  Resp:    20  SpO2: 97% 97% 99% 98%    General:  Arousable male, obese, conversant with stimulation, uncomfortable due to hip pain Eyes: PERRLA, pink conjunctiva, no scleral icterus ENT: Moist oral mucosa, neck supple, no thyromegaly Lungs: clear to ascultation, no wheeze, no crackles, no use of accessory muscles Cardiovascular: regular rate and rhythm, no regurgitation, no gallops, no murmurs. No carotid bruits, no JVD Abdomen: soft, positive BS, non-tender, non-distended, no organomegaly, not an acute abdomen GU: not examined Neuro: Unable to assess due to patient's mentation Musculoskeletal: Unable to assess due to patient's mentation, no clubbing, cyanosis or 2+ pitting edema bilateral  lower extremities Skin: no rash, no subcutaneous crepitation, no decubitus Psych: Encephalopathic patient   Labs on Admission:   Basename 11/18/11 0130  NA 133*  K 4.9  CL 95*  CO2 28  GLUCOSE 155*  BUN 45*  CREATININE 1.52*  CALCIUM 10.1  MG --  PHOS --  Results for KYRIAN, STAGE (MRN [id number]) as of 11/18/2011 05:42  Ref. Range 11/18/2011 01:30  Ammonia Latest Range: 11-60 umol/L 187 (H)    Basename 11/18/11 0130  AST 30  ALT 18  ALKPHOS 105  BILITOT 1.4*  PROT 7.1  ALBUMIN 2.9*   No results found for this  basename: LIPASE:2,AMYLASE:2 in the last 72 hours  Basename 11/18/11 0130  WBC 3.3*  NEUTROABS --  HGB 11.9*  HCT 33.5*  MCV 90.3  PLT 107*   No results found for this basename: CKTOTAL:3,CKMB:3,CKMBINDEX:3,TROPONINI:3 in the last 72 hours No results found for this basename: TSH,T4TOTAL,FREET3,T3FREE,THYROIDAB in the last 72 hours No results found for this basename: VITAMINB12:2,FOLATE:2,FERRITIN:2,TIBC:2,IRON:2,RETICCTPCT:2 in the last 72 hours  Radiological Exams on Admission: No results found.  Assessment/Plan Present on Admission:  .Hepatic encephalopathy .Cirrhosis Admit to telemetry  Increase lactulose  Monitor ammonia level  Consult GI in a.m., wife's request  .CRI (chronic renal insufficiency) .Essential hypertension, benign .Hyperlipidemia .Morbid obesity .OBSTRUCTIVE SLEEP APNEA .Coronary atherosclerosis of native coronary artery Stable Resume home meds and CPAP nocturnal  Full code DVT prophylaxis Team 4/Dr. Park Pope, Aly Hauser 11/18/2011, 5:41 AM

## 2011-11-18 NOTE — ED Notes (Signed)
WUJ:[id number]<JY> Expected date:11/18/11<BR> Expected time:12:47 AM<BR> Means of arrival:Ambulance<BR> Comments:<BR> High ammonia, ams

## 2011-11-18 NOTE — Progress Notes (Addendum)
Subjective: AMS improving; now AAOX2; no CP, no SOB. No Fever.  Objective: Vital signs in last 24 hours: Temp:  [97.5 F (36.4 C)-98.4 F (36.9 C)] 97.5 F (36.4 C) (12/21 1720) Pulse Rate:  [78-100] 84  (12/21 1720) Resp:  [18-22] 18  (12/21 1720) BP: (93-140)/(53-92) 93/61 mmHg (12/21 1720) SpO2:  [97 %-100 %] 98 % (12/21 1720) Weight change:     Intake/Output from previous day:   Total I/O In: -  Out: 1200 [Urine:1200]   Physical Exam: General: Alert, awake, oriented x2, in no acute distress. Positive ictericia. HEENT: No bruits, no goiter. Heart: Regular rate and rhythm, without murmurs, rubs, gallops. Lungs: Clear to auscultation bilaterally. Abdomen: Soft, nontender, nondistended, positive bowel sounds. Extremities: No clubbing, no cyanosis; 2++ edema bilaterally. Neuro: Grossly intact, nonfocal.   Lab Results: Basic Metabolic Panel:  Basename 11/18/11 1053 11/18/11 0130  NA -- 133*  K -- 4.9  CL -- 95*  CO2 -- 28  GLUCOSE -- 155*  BUN -- 45*  CREATININE 1.20 1.52*  CALCIUM -- 10.1  MG -- --  PHOS -- --   Liver Function Tests:  Basename 11/18/11 0130  AST 30  ALT 18  ALKPHOS 105  BILITOT 1.4*  PROT 7.1  ALBUMIN 2.9*    Basename 11/18/11 0130  AMMONIA 187*   CBC:  Basename 11/18/11 1053 11/18/11 0130  WBC 3.7* 3.3*  NEUTROABS -- --  HGB 11.7* 11.9*  HCT 33.6* 33.5*  MCV 91.1 90.3  PLT 106* 107*    Urine Drug Screen: Drugs of Abuse     Component Value Date/Time   LABOPIA NONE DETECTED 08/28/2011 0933   COCAINSCRNUR NONE DETECTED 08/28/2011 0933   LABBENZ NONE DETECTED 08/28/2011 0933   AMPHETMU NONE DETECTED 08/28/2011 0933   THCU NONE DETECTED 08/28/2011 0933   LABBARB NONE DETECTED 08/28/2011 0933     Studies/Results: No results found.  Medications: Scheduled Meds:   . sodium chloride   Intravenous Once  . aspirin EC  81 mg Oral Q0700  . docusate sodium  100 mg Oral BID  . furosemide  40 mg Oral BID  . ibuprofen  200 mg  Oral Once  . insulin detemir  40 Units Subcutaneous QHS  . lactulose  30 g Oral Once  . lactulose  30 g Oral BID  . levothyroxine  50 mcg Oral Q0700  . metoprolol succinate  12.5 mg Oral Q0700  .  morphine injection  1 mg Intravenous Once  . pantoprazole  40 mg Oral Q1200  . rifaximin  550 mg Oral TID  . DISCONTD: enoxaparin  40 mg Subcutaneous Q24H  . DISCONTD: ibuprofen  800 mg Oral Once  . DISCONTD: lactulose  30 g Oral TID  . DISCONTD: rifaximin  550 mg Oral BID   Continuous Infusions:  PRN Meds:.morphine injection, nitroGLYCERIN  Assessment/Plan: 1-AMS:2/2 hepatic encephalopathy. Improving; continue rifaximin and lactulose. Follow ammonia level in am.  2-OBSTRUCTIVE SLEEP APNEA: continue CPAP.  3-Essential hypertension, benign: well controlled with current regimen.  4-Cirrhosis: stable, continue lasix and spironolactone.  5-Coronary atherosclerosis of native coronary artery: S/P stents; continue b-blockers and aspirin. No signs of active ACS.  6-Hyperlipidemia: continue low fat diet.  7-Morbid obesity: patient following low calorie diet and eventually looking to increase exercise.  8-CRI (chronic renal insufficiency) Stage I-II: stable and at baseline continue monitoring for now.  9-Diabetes mellitus: continue SSI and Levemir.  10-GERD:continue PPI.  11-Thrombocytopenia: 2/2 liver cirrhosis; avoid heparin products.  12-DVT: SCD's.  13-Physical deconditioning  after hip replacement: will consult PT for eval/treatment.    LOS: 0 days   [name] [facility] [phone]  11/18/2011, 6:58 PM

## 2011-11-18 NOTE — ED Provider Notes (Addendum)
History     CSN: [id number]  Arrival date & time 11/18/11  0104   First MD Initiated Contact with Patient 11/18/11 [phone]      Chief Complaint  Patient presents with  . Altered Mental Status    muscle tremmors, wife states previous episodes due to high ammonia levels     Patient is a 60 y.o. male presenting with altered mental status. The history is provided by the patient and the spouse. The history is limited by the condition of the patient.  Altered Mental Status This is a recurrent problem. The current episode started 6 to 12 hours ago. The problem occurs constantly. The problem has been gradually worsening. Pertinent negatives include no headaches. The symptoms are aggravated by nothing. The symptoms are relieved by nothing.  pt with h/o cirrhosis and frequent occurrences of hepatic encephalopathy Wife reports that he has not had as frequent BM, and this evening noted he was "twitching" in his extremities and he was also confused and less responsive.  No fever reported.  No falls reported.   She reports similar to prior episodes of hepatic encephalopathy  Past Medical History  Diagnosis Date  . Hypertension   . Type II or unspecified type diabetes mellitus without mention of complication, not stated as uncontrolled   . Thyroid disease   . Cirrhosis, non-alcoholic   . Morbid obesity   . Pancytopenia   . Fatty liver   . Coronary artery disease   . Myocardial infarction     "HEART INCIDENT " OCT 2012  . Angina     RARE USE OF NTG  . CHF (congestive heart failure)     JUNE 2012  . Arthritis   . Gout 35 YRS AGO  . Cellulitis of left leg   . Cirrhosis of liver     NONALCOHOLIC  . Nocturia   . Elevated BUN   . Elevated serum creatinine   . Serum ammonia increased   . Splenomegaly   . Platelets decreased   . Thyroid disorder     PT DOES NOT KNOW IF LOW OR HIGH  . Obstructive sleep apnea     USES O2 WITH C - PAP SETTING FOR C-PA IS 16  . Chronic kidney disease (CKD),  stage III (moderate)     Past Surgical History  Procedure Date  . Coronary stent placement     2 CARDIAC STENTS OCT 08/2011  . Total knee arthroplasty 10/14/2011  . Total hip arthroplasty 10/14/2011    Procedure: TOTAL HIP ARTHROPLASTY ANTERIOR APPROACH;  Surgeon: Kathryne Hitch;  Location: [facility];  Service: Orthopedics;  Laterality: Right;  . Joint replacement   . Appendectomy 1968  . Tonsillectomy 1957  . Cardiac catheterization 09/08/2011    Family History  Problem Relation Age of Onset  . Colon cancer Neg Hx   . Diabetes Mother   . Diabetes Father     History  Substance Use Topics  . Smoking status: Never Smoker   . Smokeless tobacco: Never Used  . Alcohol Use: No      Review of Systems  Unable to perform ROS: Mental status change  Neurological: Negative for headaches.  Psychiatric/Behavioral: Positive for altered mental status.    Allergies  Crestor and Statins  Home Medications   Current Outpatient Rx  Name Route Sig Dispense Refill  . ASPIRIN 81 MG PO TABS Oral Take 81 mg by mouth every morning.     Marland Kitchen DOCUSATE SODIUM 100 MG PO CAPS Oral  Take 100 mg by mouth 2 (two) times daily.      . FUROSEMIDE 40 MG PO TABS  Take 2 (40 mg) tablets twice daily. 90 day supply 360 tablet 3  . INSULIN ASPART 100 UNIT/ML [place] SOLN Subcutaneous Inject 6 Units into the skin 3 (three) times daily with meals. 1 vial 1  . INSULIN DETEMIR 100 UNIT/ML [place] SOLN Subcutaneous Inject 40 Units into the skin at bedtime. 10 mL 0  . LACTULOSE 10 GM/15ML PO SOLN Oral Take 30 mLs (20 g total) by mouth every 3 (three) hours. Until bowel movement, once patient has bowel movement three times daily thereafter 240 mL 0  . LEVOTHYROXINE SODIUM 50 MCG PO TABS Oral Take 50 mcg by mouth every morning.     Marland Kitchen LIDOCAINE 5 % EX PTCH Transdermal Place 1 patch onto the skin daily as needed. Remove & Discard patch within 12 hours or as directed by MD..PAIN    . METOPROLOL SUCCINATE ER 25 MG PO TB24   12.5 mg every morning.     Marland Kitchen ONE-DAILY MULTI VITAMINS PO TABS Oral Take 1 tablet by mouth daily.      . NYSTOP 100000 UNIT/GM EX POWD  topically 30 g 0  . OXYCODONE HCL 5 MG PO CAPS Oral Take 1 capsule (5 mg total) by mouth every 4 (four) hours as needed. PAIN  30 capsule 0  . PANTOPRAZOLE SODIUM 40 MG PO TBEC Oral Take 1 tablet (40 mg total) by mouth daily at 12 noon. 30 tablet 0  . RIFAXIMIN 550 MG PO TABS Oral Take 1 tablet (550 mg total) by mouth 2 (two) times daily. 60 tablet 0  . ROPINIROLE HCL 1 MG PO TABS Oral Take 1 tablet (1 mg total) by mouth at bedtime. 30 tablet 0  . SPIRONOLACTONE 50 MG PO TABS  Take 3 (50 mg) tablets in the AM and 3 (50 MG) tab in the PM. 90 day supply: 540 tablets. 540 tablet 3  . NITROGLYCERIN 0.4 MG SL SUBL Sublingual Place 0.4 mg under the tongue every 5 (five) minutes as needed. CHEST PAIN      BP 129/77  Pulse 100  Temp(Src) 98.4 F (36.9 C) (Oral)  Resp 20  SpO2 98%  Physical Exam  CONSTITUTIONAL: Well developed/well nourished HEAD AND FACE: Normocephalic/atraumatic EYES: EOMI/PERRL ENMT: Mucous membranes dry NECK: supple no meningeal signs CV: murmur noted LUNGS: Lungs are clear to auscultation bilaterally, no apparent distress ABDOMEN: soft, nontender, no rebound or guarding, obese NEURO: Pt is somnolent but easily arousable, maex4 EXTREMITIES: pulses normal, full ROM SKIN: warm PSYCH: no abnormalities of mood noted   ED Course  Procedures Labs Reviewed  CBC - Abnormal; Notable for the following:    WBC 3.3 (*)    RBC 3.71 (*)    Hemoglobin 11.9 (*)    HCT 33.5 (*)    Platelets 107 (*)    All other components within normal limits  COMPREHENSIVE METABOLIC PANEL - Abnormal; Notable for the following:    Sodium 133 (*)    Chloride 95 (*)    Glucose, Bld 155 (*)    BUN 45 (*)    Creatinine, Ser 1.52 (*)    Albumin 2.9 (*)    Total Bilirubin 1.4 (*)    GFR calc non Af Amer 48 (*)    GFR calc Af Amer 55 (*)    All other  components within normal limits  AMMONIA - Abnormal; Notable for the following:  Ammonia 187 (*)    All other components within normal limits  URINALYSIS, ROUTINE W REFLEX MICROSCOPIC   5:23 AM Pt with elevated ammonia, similar to prior episodes of hepatic encephalopathy Will admit Pt stable at this time D/w [facility] for admission  MDM  Nursing notes reviewed and considered in documentation All labs/vitals reviewed and considered Previous records reviewed and considered         Joya Gaskins, MD 11/18/11 1610  Joya Gaskins, MD 11/18/11 [phone]

## 2011-11-19 LAB — BASIC METABOLIC PANEL
CO2: 24 mEq/L (ref 19–32)
Glucose, Bld: 103 mg/dL — ABNORMAL HIGH (ref 70–99)
Potassium: 4.1 mEq/L (ref 3.5–5.1)
Sodium: 137 mEq/L (ref 135–145)

## 2011-11-19 LAB — CBC
HCT: 34.7 % — ABNORMAL LOW (ref 39.0–52.0)
Hemoglobin: 12.1 g/dL — ABNORMAL LOW (ref 13.0–17.0)
MCH: 32 pg (ref 26.0–34.0)
MCV: 91.8 fL (ref 78.0–100.0)
RBC: 3.78 MIL/uL — ABNORMAL LOW (ref 4.22–5.81)

## 2011-11-19 MED ORDER — OXYCODONE HCL 5 MG PO TABS
5.0000 mg | ORAL_TABLET | ORAL | Status: DC | PRN
  Administered 2011-11-19 – 2011-11-21 (×4): 5 mg via ORAL
  Filled 2011-11-19 (×4): qty 1

## 2011-11-19 MED ORDER — LACTULOSE 10 GM/15ML PO SOLN
30.0000 g | Freq: Three times a day (TID) | ORAL | Status: DC
  Administered 2011-11-19 – 2011-11-21 (×7): 30 g via ORAL
  Filled 2011-11-19 (×11): qty 45

## 2011-11-19 MED ORDER — OXYCODONE HCL 5 MG PO TABS
5.0000 mg | ORAL_TABLET | Freq: Four times a day (QID) | ORAL | Status: DC | PRN
  Administered 2011-11-19 (×2): 5 mg via ORAL
  Filled 2011-11-19 (×2): qty 1

## 2011-11-19 NOTE — Progress Notes (Signed)
Subjective: now AAOX3; no CP, no SOB. No Fever. Patient is complaining of hip pain. No Bm's today  Objective: Vital signs in last 24 hours: Temp:  [97.5 F (36.4 C)-98.5 F (36.9 C)] 98.4 F (36.9 C) (12/22 1452) Pulse Rate:  [78-104] 95  (12/22 1452) Resp:  [18-20] 18  (12/22 1452) BP: (93-140)/(60-92) 110/73 mmHg (12/22 1452) SpO2:  [95 %-99 %] 99 % (12/22 1452) Weight:  [117.028 kg (258 lb)] 258 lb (117.028 kg) (12/21 1715) Weight change:  Last BM Date: 11/19/11  Intake/Output from previous day: 12/21 0701 - 12/22 0700 In: -  Out: 4350 [Urine:4350] Total I/O In: 420 [P.O.:420] Out: -    Physical Exam: General: Alert, awake, oriented x3, in no acute distress. Positive ictericia. HEENT: No bruits, no goiter. Heart: Regular rate and rhythm, without murmurs, rubs, gallops. Lungs: Clear to auscultation bilaterally. Abdomen: Soft, nontender, nondistended, positive bowel sounds. Extremities: No clubbing, no cyanosis; 2++ edema bilaterally. Neuro: Grossly intact, nonfocal.  Lab Results: Basic Metabolic Panel:  Basename 11/19/11 0515 11/18/11 1053 11/18/11 0130  NA 137 -- 133*  K 4.1 -- 4.9  CL 102 -- 95*  CO2 24 -- 28  GLUCOSE 103* -- 155*  BUN 28* -- 45*  CREATININE 1.11 1.20 --  CALCIUM 9.8 -- 10.1  MG -- -- --  PHOS -- -- --   Liver Function Tests:  Basename 11/18/11 0130  AST 30  ALT 18  ALKPHOS 105  BILITOT 1.4*  PROT 7.1  ALBUMIN 2.9*    Basename 11/19/11 0620 11/18/11 0130  AMMONIA 55 187*   CBC:  Basename 11/19/11 0515 11/18/11 1053  WBC 3.2* 3.7*  NEUTROABS -- --  HGB 12.1* 11.7*  HCT 34.7* 33.6*  MCV 91.8 91.1  PLT 111* 106*    Urine Drug Screen: Drugs of Abuse     Component Value Date/Time   LABOPIA NONE DETECTED 08/28/2011 0933   COCAINSCRNUR NONE DETECTED 08/28/2011 0933   LABBENZ NONE DETECTED 08/28/2011 0933   AMPHETMU NONE DETECTED 08/28/2011 0933   THCU NONE DETECTED 08/28/2011 0933   LABBARB NONE DETECTED 08/28/2011 0933      Studies/Results: No results found.  Medications: Scheduled Meds:    . aspirin EC  81 mg Oral Q0700  . docusate sodium  100 mg Oral BID  . furosemide  40 mg Oral BID  . ibuprofen  200 mg Oral Once  . insulin detemir  40 Units Subcutaneous QHS  . lactulose  30 g Oral TID  . levothyroxine  50 mcg Oral Q0700  . metoprolol succinate  12.5 mg Oral Q0700  . pantoprazole  40 mg Oral Q1200  . rifaximin  550 mg Oral TID  . DISCONTD: enoxaparin  40 mg Subcutaneous Q24H  . DISCONTD: ibuprofen  800 mg Oral Once  . DISCONTD: lactulose  30 g Oral TID  . DISCONTD: lactulose  30 g Oral BID  . DISCONTD: rifaximin  550 mg Oral BID   Continuous Infusions:  PRN Meds:.morphine injection, nitroGLYCERIN, oxyCODONE, DISCONTD: oxyCODONE  Assessment/Plan: 1-AMS: 2/2 hepatic encephalopathy. Pretty much resolved now; continue rifaximin and lactulose (last one increase to 3 times a day to guarantee at least 2 BM's per day). Ammonia level 55 today.  2-OBSTRUCTIVE SLEEP APNEA: continue CPAP.  3-Essential hypertension, benign: well control and stable with current regimen.   4-Cirrhosis: stable, continue lasix and spironolactone.  5-Coronary atherosclerosis of native coronary artery: S/P 3 bare metal stents; continue b-blockers and aspirin. No signs of active ACS.  6-Hyperlipidemia: continue  low fat diet. No statins 2/2 liver disease.  7-Morbid obesity: patient following low calorie diet and eventually looking to increase exercise activity as an outpatient.  8-CRI (chronic renal insufficiency) Stage I-II: stable and at baseline continue monitoring for now.  9-Diabetes mellitus: continue SSI and Levemir.  10-GERD:continue PPI.  11-Thrombocytopenia: 2/2 liver cirrhosis; avoid heparin products.  12-DVT: SCD's.  13-Physical deconditioning after hip replacement: Patient will required HHPT per PT eval/recommendations; will add oxycodone and morphine PRN for pain control.    LOS: 1 day    Helyn Schwan [facility] [phone]  11/19/2011, 4:20 PM

## 2011-11-19 NOTE — Progress Notes (Signed)
Pt has his home cpap & has placed himself on already. Pt is resting comfortably.  Jacqulynn Cadet  RRT

## 2011-11-19 NOTE — Progress Notes (Signed)
Physical Therapy Evaluation Patient Details Name: [name] MRN: [id number] DOB: 27-May-1951 Today's Date: 11/19/2011 1425-1525 EV2, 1GT,1TE  Discharge Recommendations: Will likely be able to discharge to home with intermittent assist and resume HHPT. Feel hospital bed for home with overhead trapeze is indicated.    Problem List:  Patient Active Problem List  Diagnoses  . AODM  . OBSTRUCTIVE SLEEP APNEA  . Essential hypertension, benign  . Cirrhosis  . Coronary atherosclerosis of native coronary artery  . Hyperlipidemia  . Morbid obesity  . Arthritis pain of hip  . CRI (chronic renal insufficiency)  . NAFLD (nonalcoholic fatty liver disease)  . Anemia  . Hepatic encephalopathy  . Hyponatremia  . Acute renal failure (ARF)  . Enterococcus UTI  . RLS (restless legs syndrome)  . Reflux  . Diabetes mellitus    Past Medical History:  Past Medical History  Diagnosis Date  . Hypertension   . Type II or unspecified type diabetes mellitus without mention of complication, not stated as uncontrolled   . Thyroid disease   . Cirrhosis, non-alcoholic   . Morbid obesity   . Pancytopenia   . Fatty liver   . Coronary artery disease   . Myocardial infarction     "HEART INCIDENT " OCT 2012  . Angina     RARE USE OF NTG  . CHF (congestive heart failure)     JUNE 2012  . Arthritis   . Gout 35 YRS AGO  . Cellulitis of left leg   . Cirrhosis of liver     NONALCOHOLIC  . Nocturia   . Elevated BUN   . Elevated serum creatinine   . Serum ammonia increased   . Splenomegaly   . Platelets decreased   . Thyroid disorder     PT DOES NOT KNOW IF LOW OR HIGH  . Obstructive sleep apnea     USES O2 WITH C - PAP SETTING FOR C-PA IS 16  . Chronic kidney disease (CKD), stage III (moderate)    Past Surgical History:  Past Surgical History  Procedure Date  . Coronary stent placement     2 CARDIAC STENTS OCT 08/2011  . Total knee arthroplasty 10/14/2011  . Total hip arthroplasty  10/14/2011    Procedure: TOTAL HIP ARTHROPLASTY ANTERIOR APPROACH;  Surgeon: Kathryne Hitch;  Location: [facility];  Service: Orthopedics;  Laterality: Right;  . Joint replacement   . Appendectomy 1968  . Tonsillectomy 1957  . Cardiac catheterization 09/08/2011    PT Assessment/Plan/Recommendation PT Assessment Clinical Impression Statement: PAtient concerned regarding discharge disposition.  Had only 2 visits from HHPT left prior to admission.  Feel he could manage at home while wife not there, but may benefit from hospital bed with overhead trapeze for home as well as continued HHPT.  Will need to discuss with wife her concerns re:d/c home.  Still with decreased strength, ROM and mobility following right THR 4 weeks ago and now with some further medical complications. PT Recommendation/Assessment: Patient will need skilled PT in the acute care venue PT Problem List: Decreased range of motion;Decreased strength;Decreased mobility Barriers to Discharge: Decreased caregiver support PT Therapy Diagnosis : Generalized weakness PT Plan PT Frequency: Min 3X/week PT Treatment/Interventions: Gait training;DME instruction;Stair training;Functional mobility training;Therapeutic activities;Therapeutic exercise;Patient/family education PT Recommendation Follow Up Recommendations: Home health PT Equipment Recommended:  (hospital bed with overhead trapeze) PT Goals  Acute Rehab PT Goals PT Goal Formulation: With patient Time For Goal Achievement: 7 days Pt will go Supine/Side to  Sit: with supervision;with rail PT Goal: Supine/Side to Sit - Progress: Not met Pt will go Sit to Supine/Side: with supervision;with rail PT Goal: Sit to Supine/Side - Progress: Not met Pt will go Sit to Stand: with modified independence (with consistency) PT Goal: Sit to Stand - Progress: Partly met Pt will go Stand to Sit: with modified independence PT Goal: Stand to Sit - Progress: Not met Pt will Ambulate: >150  feet;with modified independence;with rolling walker PT Goal: Ambulate - Progress: Not met Pt will Go Up / Down Stairs: 3-5 stairs;with supervision;with least restrictive assistive device;with rail(s) PT Goal: Up/Down Stairs - Progress: Not met  PT Evaluation Precautions/Restrictions  Precautions Precautions: Fall Prior Functioning  Home Living Lives With: Spouse Receives Help From: Family Type of Home: House Home Layout: One level;Laundry or work area in [employer] Access: Stairs to enter Entrance Stairs-Rails: Engineer, manufacturing of Steps: 3 Home Adaptive Equipment: Bedside commode/3-in-1;Walker - four wheeled Prior Function Level of Independence: Requires assistive device for independence;Independent with gait;Independent with transfers;Independent with homemaking with ambulation Cognition Cognition Arousal/Alertness: Awake/alert Overall Cognitive Status: Appears within functional limits for tasks assessed Cognition - Other Comments: some evidence of decreased memory Sensation/Coordination   Extremity Assessment RLE Assessment RLE Assessment: Exceptions to [facility] RLE AROM (degrees) Overall AROM Right Lower Extremity: Within functional limits for tasks assessed RLE Overall AROM Comments: AAROM to AROM with hip flexion combined with abduction and external rotation with premorbid deficiencies RLE Strength RLE Overall Strength: Deficits RLE Overall Strength Comments: grossly 3/5 LLE Assessment LLE Assessment: Within Functional Limits Mobility (including Balance) Bed Mobility Supine to Sit: 4: Min assist;HOB elevated (Comment degrees);With rails Supine to Sit Details (indicate cue type and reason): heavy support on rails to sit up, allowed increased time but patient unable to without assist come completely upright Sitting - Scoot to Edge of Bed: 5: Supervision;With rail Transfers Sit to Stand: 6: Modified independent (Device/Increase time);From bed Stand to Sit: 5:  Supervision;To chair/3-in-1;With armrests Stand to Sit Details: cues for armrests and safety patient asked to have multiple pillows in chair, placed on layer of pillows for comfort, but patient unable to scoot all the way back in chair and needed pillows behind him Ambulation/Gait Ambulation/Gait Assistance: 5: Supervision Ambulation/Gait Assistance Details (indicate cue type and reason): cues for increased speed Ambulation Distance (Feet): 200 Feet Assistive device: Rolling walker Gait Pattern: Antalgic (on right) Gait velocity: very slow at times, but when voiced concern due to fall risk, able to increase to reasonable speed and maintain  Static Standing Balance Static Standing - Balance Support: No upper extremity supported Static Standing - Level of Assistance: 6: Modified independent (Device/Increase time) (at least 60 seconds) Dynamic Standing Balance Dynamic Standing - Balance Support: Bilateral upper extremity supported Dynamic Standing - Level of Assistance: 5: Stand by assistance Dynamic Standing - Balance Activities: Forward lean/weight shifting (forward and back leaning weight shifting to improve back pai) Exercise  Total Joint Exercises Ankle Circles/Pumps: AROM;Both;10 reps;Supine Heel Slides: AROM;AAROM;Right;10 reps;Supine End of Session PT - End of Session Equipment Utilized During Treatment: Gait belt Activity Tolerance: Patient tolerated treatment well Patient left: in chair;with call bell in reach General Behavior During Session: [facility] for tasks performed Cognition: [facility] for tasks performed  [facility] 11/19/2011, 3:58 PM

## 2011-11-20 ENCOUNTER — Inpatient Hospital Stay (HOSPITAL_COMMUNITY): Payer: 59

## 2011-11-20 LAB — GLUCOSE, CAPILLARY: Glucose-Capillary: 244 mg/dL — ABNORMAL HIGH (ref 70–99)

## 2011-11-20 MED ORDER — METHOCARBAMOL 100 MG/ML IJ SOLN
500.0000 mg | Freq: Two times a day (BID) | INTRAMUSCULAR | Status: DC | PRN
  Filled 2011-11-20: qty 5

## 2011-11-20 MED ORDER — DEXTROSE 5 % IV SOLN
500.0000 mg | Freq: Two times a day (BID) | INTRAVENOUS | Status: DC | PRN
  Administered 2011-11-20: 500 mg via INTRAVENOUS
  Filled 2011-11-20 (×2): qty 5

## 2011-11-20 NOTE — Progress Notes (Signed)
Subjective: AAOX3; no CP, no SOB. No Fever. Patient hip pain continues and now associated with lower back pain as well. Patient had a bowel movement overnight Objective: Vital signs in last 24 hours: Temp:  [98.4 F (36.9 C)-98.6 F (37 C)] 98.4 F (36.9 C) (12/23 0600) Pulse Rate:  [80-96] 96  (12/23 0600) Resp:  [18] 18  (12/23 0600) BP: (110-128)/(55-80) 128/80 mmHg (12/23 0600) SpO2:  [99 %-100 %] 100 % (12/23 0600) Weight change:  Last BM Date: 11/19/11  Intake/Output from previous day: 12/22 0701 - 12/23 0700 In: 420 [P.O.:420] Out: 1900 [Urine:1900]     Physical Exam: General: Alert, awake, oriented x3, in no acute distress. Positive ictericia. HEENT: No bruits, no goiter. Heart: Regular rate and rhythm, with positive systolic murmur, rubs, gallops. Lungs: Clear to auscultation bilaterally. Abdomen: Soft, nontender, nondistended, positive bowel sounds. Extremities: No clubbing, no cyanosis; 1-2++ edema bilaterally. Neuro: Grossly intact, nonfocal.  Lab Results: Basic Metabolic Panel:  Basename 11/19/11 0515 11/18/11 1053 11/18/11 0130  NA 137 -- 133*  K 4.1 -- 4.9  CL 102 -- 95*  CO2 24 -- 28  GLUCOSE 103* -- 155*  BUN 28* -- 45*  CREATININE 1.11 1.20 --  CALCIUM 9.8 -- 10.1  MG -- -- --  PHOS -- -- --   Liver Function Tests:  Basename 11/18/11 0130  AST 30  ALT 18  ALKPHOS 105  BILITOT 1.4*  PROT 7.1  ALBUMIN 2.9*    Basename 11/19/11 0620 11/18/11 0130  AMMONIA 55 187*   CBC:  Basename 11/19/11 0515 11/18/11 1053  WBC 3.2* 3.7*  NEUTROABS -- --  HGB 12.1* 11.7*  HCT 34.7* 33.6*  MCV 91.8 91.1  PLT 111* 106*    Urine Drug Screen: Drugs of Abuse     Component Value Date/Time   LABOPIA NONE DETECTED 08/28/2011 0933   COCAINSCRNUR NONE DETECTED 08/28/2011 0933   LABBENZ NONE DETECTED 08/28/2011 0933   AMPHETMU NONE DETECTED 08/28/2011 0933   THCU NONE DETECTED 08/28/2011 0933   LABBARB NONE DETECTED 08/28/2011 0933      Studies/Results: No results found.  Medications: Scheduled Meds:    . aspirin EC  81 mg Oral Q0700  . docusate sodium  100 mg Oral BID  . furosemide  40 mg Oral BID  . insulin detemir  40 Units Subcutaneous QHS  . lactulose  30 g Oral TID  . levothyroxine  50 mcg Oral Q0700  . metoprolol succinate  12.5 mg Oral Q0700  . pantoprazole  40 mg Oral Q1200  . rifaximin  550 mg Oral TID  . DISCONTD: ibuprofen  200 mg Oral Once  . DISCONTD: lactulose  30 g Oral BID   Continuous Infusions:  PRN Meds:.methocarbamol, morphine injection, nitroGLYCERIN, oxyCODONE, DISCONTD: oxyCODONE  Assessment/Plan: 1-AMS: 2/2 hepatic encephalopathy. Resolved. Patient now alert, awake and oriented x3. Will continue rifaximin and lactulose at the same doses. He had a bowel movement overnight.  2-OBSTRUCTIVE SLEEP APNEA: continue CPAP.  3-Essential hypertension, benign: well control and stable with current regimen. No medication changes require  4-Cirrhosis: stable, continue lasix and spironolactone.  5-Coronary atherosclerosis of native coronary artery: S/P 3 bare metal stents; continue b-blockers and aspirin. No signs of active ACS.  6-Hyperlipidemia: continue low fat diet. No statins 2/2 liver disease.  7-Morbid obesity: Continue low calorie diet.  8-CRI (chronic renal insufficiency) Stage I-II: At baseline.  9-Diabetes mellitus: continue SSI and Levemir.  10-GERD:continue PPI.  11-Thrombocytopenia: 2/2 liver cirrhosis; stable. Continue avoiding heparin products  or NSAIDs.  12-DVT: SCD's.  13-Physical deconditioning after hip replacement: Patient will required HHPT per PT eval/recommendations. He will also need a hospital bed and we have requested today. Endorses he is still complaining of lower back pain; will perform an x-ray of her lower back and will start him on Robaxin (since description appears to be related to muscle spasm). If x-ray normal plan is to discharge him tomorrow.     LOS: 2 days   Jencarlo Bonadonna [facility] [phone]  11/20/2011, 11:52 AM

## 2011-11-20 NOTE — Progress Notes (Signed)
INITIAL ADULT NUTRITION ASSESSMENT Date: 11/20/2011   Time: 12:11 PM Reason for Assessment: Consult  ASSESSMENT: Male 60 y.o.  Dx: hepatic encephalopathy, cirrhosis, chronic renal insufficiency  Past Medical History  Diagnosis Date  . Hypertension   . Type II or unspecified type diabetes mellitus without mention of complication, not stated as uncontrolled   . Thyroid disease   . Cirrhosis, non-alcoholic   . Morbid obesity   . Pancytopenia   . Fatty liver   . Coronary artery disease   . Myocardial infarction     "HEART INCIDENT " OCT 2012  . Angina     RARE USE OF NTG  . CHF (congestive heart failure)     JUNE 2012  . Arthritis   . Gout 35 YRS AGO  . Cellulitis of left leg   . Cirrhosis of liver     NONALCOHOLIC  . Nocturia   . Elevated BUN   . Elevated serum creatinine   . Serum ammonia increased   . Splenomegaly   . Platelets decreased   . Thyroid disorder     PT DOES NOT KNOW IF LOW OR HIGH  . Obstructive sleep apnea     USES O2 WITH C - PAP SETTING FOR C-PA IS 16  . Chronic kidney disease (CKD), stage III (moderate)     Scheduled Meds:    . aspirin EC  81 mg Oral Q0700  . docusate sodium  100 mg Oral BID  . furosemide  40 mg Oral BID  . insulin detemir  40 Units Subcutaneous QHS  . lactulose  30 g Oral TID  . levothyroxine  50 mcg Oral Q0700  . metoprolol succinate  12.5 mg Oral Q0700  . pantoprazole  40 mg Oral Q1200  . rifaximin  550 mg Oral TID  . DISCONTD: ibuprofen  200 mg Oral Once  . DISCONTD: lactulose  30 g Oral BID   Continuous Infusions:  PRN Meds:.methocarbamol(ROBAXIN) IV, morphine injection, nitroGLYCERIN, oxyCODONE, DISCONTD: methocarbamol, DISCONTD: oxyCODONE   Ht: 5\' 9"  (175.3 cm)  Wt: 258 lb (117.028 kg)  Ideal Wt: 72.7 kg % Ideal Wt: 161%  Usual Wt: Pt reports >100lb weight loss in less than 1 year  Body mass index is 38.10 kg/(m^2).  Food/Nutrition Related Hx:  Per progress note, pt following low calorie diet PTA and  looking to increase physical activity as an outpatient. Pt reports weight loss was "unintentional, but necessary" and attributes some weight loss to fluid. Pt stated he sees a [facility] dietitian as an outpatient. Pt reports no problem chewing/swallowing or other nutrition-related issues at this time. Pt was eating lunch at time of visit and very happy with food and food service personnel.   Labs:  CMP     Component Value Date/Time   NA 137 11/19/2011 0515   K 4.1 11/19/2011 0515   CL 102 11/19/2011 0515   CO2 24 11/19/2011 0515   GLUCOSE 103* 11/19/2011 0515   BUN 28* 11/19/2011 0515   CREATININE 1.11 11/19/2011 0515   CALCIUM 9.8 11/19/2011 0515   PROT 7.1 11/18/2011 0130   ALBUMIN 2.9* 11/18/2011 0130   AST 30 11/18/2011 0130   ALT 18 11/18/2011 0130   ALKPHOS 105 11/18/2011 0130   BILITOT 1.4* 11/18/2011 0130   GFRNONAA 70* 11/19/2011 0515   GFRAA 81* 11/19/2011 0515   Lab Results  Component Value Date   HGBA1C 5.9* 10/24/2011   HGBA1C 6.1* 10/15/2011   HGBA1C 6.6* 08/28/2011   Lab Results  Component  Value Date   LDLCALC 107* 08/28/2011   CREATININE 1.11 11/19/2011     Intake/Output Summary (Last 24 hours) at 11/20/11 1211 Last data filed at 11/20/11 0300  Gross per 24 hour  Intake      0 ml  Output   1900 ml  Net  -1900 ml  BM overnight.  Diet Order: Carbohydrate modified, medium calorie (100% PO intake documented)  Supplements/Tube Feeding: None at this time  IVF:  None at this time  Estimated Nutritional Needs:   Kcal: 2500-2600 Protein: 90-100 gm Fluid: >2.6 L  NUTRITION DIAGNOSIS: No nutrition diagnosis at this time.   MONITORING/EVALUATION(Goals): Monitor: PO intake, weight changes, labs  EDUCATION NEEDS: -No education needs identified at this time  INTERVENTION: Continue Carbohydrate modified, medium calorie diet.   DOCUMENTATION CODES Per approved criteria  -Obesity Unspecified    Leonette Most 11/20/2011, 12:11 PM

## 2011-11-20 NOTE — Progress Notes (Signed)
Pt has home cpap & places on himself. He told me last night he didn't need my help, & not to check on him.  Jacqulynn Cadet  RRT

## 2011-11-20 NOTE — Progress Notes (Addendum)
Cm spoke with pt concerning d/c planning. Per pt request trapeze bar instead of hospital bed upon discharge. Rn notified to alert MD of need of rx for trapeze bar for insurance company. Patient previously active with [facility]. [facility] to continue HHPT services per pt choice upon d/c. Pt's spouse to assist in care.  Weekend case manager [phone]

## 2011-11-20 NOTE — Plan of Care (Signed)
Problem: Phase II Progression Outcomes Goal: Discharge plan established Outcome: Progressing Home with home health aid

## 2011-11-21 MED ORDER — LACTULOSE 10 GM/15ML PO SOLN: 30.0000 g | Freq: Three times a day (TID) | ORAL | Status: AC

## 2011-11-21 MED ORDER — OXYCODONE HCL 5 MG PO TABS: 5.0000 mg | ORAL_TABLET | ORAL | Status: AC | PRN

## 2011-11-21 MED ORDER — METHOCARBAMOL 500 MG PO TABS: 500.0000 mg | ORAL_TABLET | Freq: Two times a day (BID) | ORAL | Status: AC | PRN

## 2011-11-21 NOTE — Progress Notes (Signed)
Assessment complete, see for details. Patient resting quietly in bed, no acute distress noted. No needs, concerns.

## 2011-11-21 NOTE — Discharge Summary (Signed)
Physician Discharge Summary  Patient ID: ELYA DILORETO MRN: [id number] DOB/AGE: 1951-02-19 60 y.o.  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 Qty: 30 tablet, Refills: 0    oxyCODONE (OXY IR/ROXICODONE) 5 MG immediate release tablet Take 1 tablet (5 mg total) by mouth every 4 (four) hours as needed for pain. Qty: 30 tablet, Refills: 0      CONTINUE these medications which have CHANGED   Details  lactulose (CHRONULAC) 10 GM/15ML solution Take 45 mLs (30 g total) by mouth 3 (three) times daily. Qty: 240 mL, Refills: 1      CONTINUE these medications which have NOT CHANGED   Details  aspirin 81 MG tablet Take 81 mg by mouth every morning.     docusate sodium (COLACE) 100 MG capsule Take 100 mg by mouth 2 (two) times daily.      furosemide (LASIX) 40 MG tablet Take 2 (40 mg) tablets twice daily. 90 day supply Qty: 360 tablet, Refills: 3    insulin aspart (NOVOLOG) 100 UNIT/ML injection Inject 6 Units into the skin 3 (three) times daily with meals. Qty: 1 vial, Refills: 1    insulin detemir (LEVEMIR) 100 UNIT/ML injection Inject 40 Units into the skin at bedtime. Qty: 10 mL, Refills: 0      levothyroxine (SYNTHROID, LEVOTHROID) 50 MCG tablet Take 50 mcg by mouth every morning.     lidocaine (LIDODERM) 5 % Place 1 patch onto the skin daily as needed. Remove & Discard patch within 12 hours or as directed by MD..PAIN    metoprolol succinate (TOPROL-XL) 25 MG 24 hr tablet 12.5 mg every morning.     Multiple Vitamin (MULTIVITAMIN) tablet Take 1 tablet by mouth daily.      nystatin (NYSTOP) 100000 UNIT/GM POWD topically Qty: 30 g, Refills: 0    pantoprazole (PROTONIX) 40 MG tablet Take 1 tablet (40 mg total) by mouth daily at 12 noon. Qty: 30 tablet, Refills: 0    rifaximin (XIFAXAN) 550 MG TABS Take 1 tablet (550 mg total) by mouth 2 (two) times daily. Qty: 60 tablet, Refills: 0    rOPINIRole (REQUIP) 1 MG tablet Take 1 tablet (1 mg total) by mouth at bedtime. Qty: 30 tablet, Refills: 0    spironolactone (ALDACTONE) 50 MG tablet Take 3 (50 mg) tablets in the AM and 3 (50 MG) tab in the PM. 90 day supply: 540 tablets. Qty: 540 tablet, Refills: 3    nitroGLYCERIN (NITROSTAT) 0.4 MG SL tablet Place 0.4 mg under the tongue every 5 (five) minutes as needed. CHEST PAIN      STOP taking these medications     oxycodone (OXY-IR) 5 MG capsule          Disposition and Follow-up:  Patient discharge in stable and improved condition; with complete resolution of his AMS. He is complaining of mild  intermittent back pain still, but is overall better controlled. He will arrange followup visit with PCP and will follow discharge instructions and take medications as prescribed. Will be important to performed DEXA scan in order to r/o osteoporosis and and will also be important to follow on his back pain.   Consults:   None   Significant Diagnostic Studies:  No results found.  Brief H and P: 60 year old male with known history of liver cirrhosis on lactulose. Per wife she noted today he has some extra twitching. Patient was taking less lactulose than the one indicated by his GI.  Apparently his gastroenterologist suggest he have at least 2 BM's daily, he stated he didn't increase the dose of lactulose and was having just one a day. Night of admission patient was confused with acute altered mental status and increased twitching. In ED ammonia level was 187   Hospital Course:  1-AMS 2/2 hepatic encephalopathy: Resolved. He will continue Lactulose (dose adjusted) and also rifaximin. He will follow with PCP and GI service for further adjustment on his medications. Advised to follow a low protein/ low sodium diet.  2-cirrhosis: continue lasix and spironolactone  3-Stage 1-2 renal failure: stable and at baseline; continue monitoring.  4-OSA: continue CPAP at bedtime.  5-Coronary arter disease: continue aspirin and beta blockers.  6-L5 endplate fracture (age undetermine): no acute neurologic deficit; will provide pain medications and arrange for PT; followup with PCP as an outpatient for DEXA scan and further treatment.  7-HLD: advised to follow a low fat diet.  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 DME's has been ordered to assist with changing position as recommended by PT.  10-DM:continue SSI and Levemir  11-Hyponatremia and Thrombocytopenia: due to cirrhosis; stable. Continue monitoring his level during follow up.  12- GERD: continue PPI.  Time spent on Discharge: 50 minutes  Signed: Becky Berberian 11/21/2011, 1:46 PM

## 2011-11-21 NOTE — Progress Notes (Signed)
Physical Therapy Cancel Note Patient in process of discharge.  Discussed equipment and follow up services being arranged.  Spoke with Case Manager for patient due to questions about arranging delivery of equipment.  She will call patient.  Will cancel in anticipation of discharge.  [place], [place] [phone] 11/21/2011

## 2011-11-21 NOTE — Progress Notes (Signed)
IV discontinued, patient awaiting transport to ride. Patient tolerated well, no acute distress noted.

## 2011-11-21 NOTE — Progress Notes (Signed)
ACTIVE W/[facility] FOR HHPT.HHPT/RN/OT/HOSPITAL BED W/TRAPEZE.

## 2011-11-21 NOTE — Progress Notes (Signed)
Discharge instructions and prescriptions given to patient. Patient resting quietly in bed, no acute distress noted. Awaiting ride.

## 2011-11-21 NOTE — Progress Notes (Signed)
HOSPITAL BED,& TRAPEZE.

## 2011-11-21 NOTE — Progress Notes (Signed)
[facility]-HHPT, HOSPITAL BED.

## 2011-11-21 NOTE — Progress Notes (Signed)
Foley discontinued per orders. Patient tolerated well, no acute distress noted.

## 2011-11-23 ENCOUNTER — Encounter: Payer: Self-pay | Admitting: Gastroenterology

## 2011-11-23 ENCOUNTER — Ambulatory Visit (INDEPENDENT_AMBULATORY_CARE_PROVIDER_SITE_OTHER): Payer: 59 | Admitting: Gastroenterology

## 2011-11-23 VITALS — BP 90/58 | HR 88 | Ht 69.0 in | Wt 265.0 lb

## 2011-11-23 DIAGNOSIS — R609 Edema, unspecified: Secondary | ICD-10-CM

## 2011-11-23 DIAGNOSIS — K7290 Hepatic failure, unspecified without coma: Secondary | ICD-10-CM

## 2011-11-23 DIAGNOSIS — K7460 Unspecified cirrhosis of liver: Secondary | ICD-10-CM

## 2011-11-23 DIAGNOSIS — K7682 Hepatic encephalopathy: Secondary | ICD-10-CM

## 2011-11-23 MED ORDER — RIFAXIMIN 550 MG PO TABS: 550.0000 mg | ORAL_TABLET | Freq: Two times a day (BID) | ORAL | Status: DC

## 2011-11-23 NOTE — Patient Instructions (Addendum)
Continue on current lactulose dose (the goal is 2-3 soft BMs daily) Continue on zifaxin one pill twice daily. Continue your diuretics (aldactone 150 twice daily, lasix 80 twice daily). BMET in 10 days.  12/01/11 [facility]  Rov with Dr. Christella Hartigan in 3 weeks. Be careful with narcotic pain medicines, they can contribute to confusion from liver disease.

## 2011-11-23 NOTE — Progress Notes (Signed)
Review of pertinent gastrointestinal problems:  1. Cirrhosis: Likely from fatty liver, perhaps cardiac related  Labs July 2012: ANA negative, HIV negative, iron studies normal, hepatitis B. surface antigen negative, hepatitis B core antibody negative, hepatitis C antibody negative, hepatitis A IgM negative. Anti-mitochondrial antibody negative, hepatitis B surface antibody negative, hepatitis a total antibody negative, ceruloplasmin normal.  Most recent imaging: CT scan June 2012 showed pleural effusion, ascites, cirrhosis and splenomegaly (non-IV contrast due to renal insufficiency). .  Most recent EGD: September 2012 found mild nonspecific gastritis but no varices  Most recet AFP aug 2012 normal 2. Routine risk for colon cancer, colonoscopy September 2012 was normal. Next colonoscopy at 10 year interval   HPI: This is a  very pleasant 60 year old man who is here with his wife today. He was recently hospitalized for 3 nights for mild hepatic encephalopathy. No clear etiology however he was not on proper dosing of his lactulose.  Currently  He is taking lactulose tid and having one BM a day.  Takes narcotics (less than previously) but still 1-3 times a day.  Taking it for back pains now.  From a fluid standpoint he is very happy about his edema in his legs, it is slowly improving.    Past Medical History  Diagnosis Date  . Hypertension   . Type II or unspecified type diabetes mellitus without mention of complication, not stated as uncontrolled   . Thyroid disease   . Cirrhosis, non-alcoholic   . Morbid obesity   . Pancytopenia   . Fatty liver   . Coronary artery disease   . Myocardial infarction     "HEART INCIDENT " OCT 2012  . Angina     RARE USE OF NTG  . CHF (congestive heart failure)     JUNE 2012  . Arthritis   . Gout 35 YRS AGO  . Cellulitis of left leg   . Cirrhosis of liver     NONALCOHOLIC  . Nocturia   . Elevated BUN   . Elevated serum creatinine   . Serum ammonia  increased   . Splenomegaly   . Platelets decreased   . Thyroid disorder     PT DOES NOT KNOW IF LOW OR HIGH  . Obstructive sleep apnea     USES O2 WITH C - PAP SETTING FOR C-PA IS 16  . Chronic kidney disease (CKD), stage III (moderate)     Past Surgical History  Procedure Date  . Coronary stent placement     2 CARDIAC STENTS OCT 08/2011  . Total knee arthroplasty 10/14/2011  . Total hip arthroplasty 10/14/2011    Procedure: TOTAL HIP ARTHROPLASTY ANTERIOR APPROACH;  Surgeon: Kathryne Hitch;  Location: [facility];  Service: Orthopedics;  Laterality: Right;  . Joint replacement   . Appendectomy 1968  . Tonsillectomy 1957  . Cardiac catheterization 09/08/2011    Current Outpatient Prescriptions  Medication Sig Dispense Refill  . aspirin 81 MG tablet Take 81 mg by mouth every morning.       . docusate sodium (COLACE) 100 MG capsule Take 100 mg by mouth 2 (two) times daily.        . furosemide (LASIX) 40 MG tablet Take 2 (40 mg) tablets twice daily. 90 day supply  360 tablet  3  . insulin aspart (NOVOLOG) 100 UNIT/ML injection Inject 6 Units into the skin 3 (three) times daily with meals.  1 vial  1  . insulin detemir (LEVEMIR) 100 UNIT/ML injection Inject 40 Units  into the skin at bedtime.  10 mL  0  . lactulose (CHRONULAC) 10 GM/15ML solution Take 45 mLs (30 g total) by mouth 3 (three) times daily.  240 mL  1  . levothyroxine (SYNTHROID, LEVOTHROID) 50 MCG tablet Take 50 mcg by mouth every morning.       . lidocaine (LIDODERM) 5 % Place 1 patch onto the skin daily as needed. Remove & Discard patch within 12 hours or as directed by MD..PAIN      . methocarbamol (ROBAXIN) 500 MG tablet Take 1 tablet (500 mg total) by mouth every 12 (twelve) hours as needed (muscle spasm and pain).  30 tablet  0  . metoprolol succinate (TOPROL-XL) 25 MG 24 hr tablet 12.5 mg every morning.       . Multiple Vitamin (MULTIVITAMIN) tablet Take 1 tablet by mouth daily.        . nitroGLYCERIN  (NITROSTAT) 0.4 MG SL tablet Place 0.4 mg under the tongue every 5 (five) minutes as needed. CHEST PAIN      . nystatin (NYSTOP) 100000 UNIT/GM POWD topically  30 g  0  . oxyCODONE (OXY IR/ROXICODONE) 5 MG immediate release tablet Take 1 tablet (5 mg total) by mouth every 4 (four) hours as needed for pain.  30 tablet  0  . pantoprazole (PROTONIX) 40 MG tablet Take 1 tablet (40 mg total) by mouth daily at 12 noon.  30 tablet  0  . rifaximin (XIFAXAN) 550 MG TABS Take 1 tablet (550 mg total) by mouth 2 (two) times daily.  60 tablet  0  . rOPINIRole (REQUIP) 1 MG tablet Take 1 tablet (1 mg total) by mouth at bedtime.  30 tablet  0  . spironolactone (ALDACTONE) 50 MG tablet Take 3 (50 mg) tablets in the AM and 3 (50 MG) tab in the PM. 90 day supply: 540 tablets.  540 tablet  3    Allergies as of 11/23/2011 - Review Complete 11/23/2011  Allergen Reaction Noted  . Crestor (rosuvastatin calcium)  10/24/2011  . Statins Other (See Comments) 07/12/2011    Family History  Problem Relation Age of Onset  . Colon cancer Neg Hx   . Diabetes Mother   . Diabetes Father     History   Social History  . Marital Status: Married    Spouse Name: N/A    Number of Children: 1  . Years of Education: N/A   Occupational History  . Not on file.   Social History Main Topics  . Smoking status: Never Smoker   . Smokeless tobacco: Never Used  . Alcohol Use: No  . Drug Use: No  . Sexually Active: No   Other Topics Concern  . Not on file   Social History Narrative  . No narrative on file      Physical Exam: Wt 265 lb (120.203 kg) Constitutional: gmorbidly obese, chronically ill appearing. Psychiatric: alert and oriented x3 Abdomen: soft, nontender, nondistended, no obvious ascites, no peritoneal signs, normal bowel sounds Lower extremity edema, pitting, 1+     Assessment and plan: 60 y.o. male with cirrhosis complicated by ascites, encephalopathy  I have renewed his prescription for  Xifaxan at 550 mg twice daily for hepatic encephalopathy control. He will continue on lactulose as well. He will continue his current doses of diuretics and will have a basic metabolic profile drawn in about 10 days. He returned here to see me in 3 weeks.

## 2011-11-24 ENCOUNTER — Encounter: Payer: Self-pay | Admitting: Dietician

## 2011-11-24 ENCOUNTER — Encounter: Payer: 59 | Attending: Internal Medicine | Admitting: Dietician

## 2011-11-24 DIAGNOSIS — K7460 Unspecified cirrhosis of liver: Secondary | ICD-10-CM

## 2011-11-24 DIAGNOSIS — I10 Essential (primary) hypertension: Secondary | ICD-10-CM | POA: Insufficient documentation

## 2011-11-24 DIAGNOSIS — K7290 Hepatic failure, unspecified without coma: Secondary | ICD-10-CM

## 2011-11-24 DIAGNOSIS — E119 Type 2 diabetes mellitus without complications: Secondary | ICD-10-CM | POA: Insufficient documentation

## 2011-11-24 DIAGNOSIS — Z713 Dietary counseling and surveillance: Secondary | ICD-10-CM | POA: Insufficient documentation

## 2011-11-24 NOTE — Progress Notes (Signed)
Medical Nutrition Therapy:  Appt start time: 0930 end time:  1015.  Assessment:  Primary concerns today: Edema and changing blood glucose levels and how much insulin to take before meals.  Has had a very unhealthy fall since his July 2012 visit.  Had a RT hip replacement on 10/14/2011.  This was followed with some in hospital rehab and then time at [facility] for more rehab.  He left there and was readmitted to the hospital with an increased ammonia level.  Following his discharge on 11/21/2011, he is to be at home with PT coming to his home.  He has lost 90 lb since I first saw him 03/09/2011.  Today, he still has 4+ bilateral edema in the lower legs and feet.  He is on BID Lasix doses and does not consistent take as needed.  Given his ammonia levels, at times his memory recall is not the sharpest.  He is trying to read labels and to limit sodium in his diet.  We reviewed the foods most likely to increase sodium and I provided him with a high, medium and low list.  We reviewed the food label. Blood glucose levels are checked in the AM fasting and he is running 130-150 mg.   MEDICATIONS: See current list. Levimer 40 units at bedtime  and  Novolog 6 units before meals.  DIETARY INTAKE:  24-hr recall:  B (6:00-7:00 AM): Shaundra Fullam have fruit and boiled eggs, or Verner Kopischke have cereal and a banana, or Yulitza Shorts have 2 scrambled eggs with cheese, a slice of wheat bread (thin sliced), and grits 1 package with this meal. OR he Darci Lykins just have fruit.  Snk (mid AM) :Usually no snack  L (Mid-day): Evelio Rueda have fruit with cheese, especially if he has had a large breakfast.  He Mairlyn Tegtmeyer well have no snack.  Snk (late PM): Usually no snack or a piece of fruit  D (6:00 PM):  Bowl of chili, small serving of limas and corn, and 6 [employer].   Snk (HS PM): If has a snack it will be fruit. Beverages: water, failed to asses for other beverages.  Recent physical activity: Very limited, doing the PT exercises for the  rehab process.  Using the rolling/sitting walker at this time and carries a cane to use if needed.  Estimated energy needs:Weight loss,  1700-1800 calories 200-205 g carbohydrates 100-110 g protein 45-50 g fat  Progress Towards Goal(s):  Some progress.   Nutritional Diagnosis:  [place]-3.3 Overweight/obesity As related to excessive sodium intake, edmema .  As evidenced by 4+ pitting edema in bilateral legs and feet..    Intervention:  Nutrition Brief review of his need to limit sodium. He has a number of needs and is having difficulty trying to balance the need for high quality protein with the liver failure issues, the sodium levels with the edema, and the glucose levels with his carbohydrate intake.  He has lost 90 lbs over the last 8 months.  His wife is quite frustrated with his reoccurring issues.  She does try to help him with his food choices, but is currently working 7 days a week at 2 jobs and is not that readily available for assistance.  He is ordering his groceries from [employer], and shows an interest in learning to cook with less sodium at home.  Handouts given during visit include:  American Dietetics Sodium Values list.  3 day food recall record for counting carbohydrates  Novo Nordisk Carbohydrate Counting Guide.  Monitoring/Evaluation:  Dietary intake, exercise, blood glucose levels, and body weight in 3 months or sooner.

## 2011-11-24 NOTE — Patient Instructions (Addendum)
   Set an alarm for your bedtime or evening Lasix.  Checking the blood glucose levels 2 hours after the first bite of lunch one day, then dinner, then breakfast or a large snack.   Goal:80-160 mg.  For the green beans or the other solid veggies, you can pour the liquid off, rinse, then soak in luke warm water for 10-15 minutes for 2-3 times then cook the veggies.   Aim toward the no added salt and the frozen without added salt.  Low sodium or salt free broth.  Make with chicken, add onion, garlic, celery, peppercorns, water, boil/simmer.  Strain the broth, then freeze in the ice trays if you have them.  If chicken looks to have a lot of fat, then cool in the refrigerator and skim off the fat.  My fitness pal is a good recording site for your carbs and calories.  Glucose Buddy is good to record the glucose levels.  Cheese low sodium, Food [employer], lite blue label, 5 mg /ounce.   Have Tiffany call the HgA1C to you and let Seward Grater know at [phone].

## 2011-12-02 ENCOUNTER — Ambulatory Visit: Payer: 59 | Admitting: Gastroenterology

## 2011-12-02 ENCOUNTER — Telehealth: Payer: Self-pay | Admitting: Gastroenterology

## 2011-12-02 DIAGNOSIS — K7460 Unspecified cirrhosis of liver: Secondary | ICD-10-CM

## 2011-12-02 NOTE — Telephone Encounter (Signed)
Pt was notified and will stop the meds as directed and restart per Dr Christella Hartigan recommendations.  Pt wants to know if he should continue his lactulose and if he needs a refill on generalac that was given to him in the hospital 10gm/15 ml 30 ml every 3 hours until bowel movement and then three times daily.  Also the pt will have his wife call with her fax number so that his labs can be done next week.

## 2011-12-02 NOTE — Telephone Encounter (Signed)
Wife called and info was given to her as well her fax number is [phone] lab order faxed.

## 2011-12-02 NOTE — Telephone Encounter (Signed)
bmet dated yesterday:  Sod 122 k 4.8 Bun 74 Cr 2.37   Patty, His Creatinine has really bumped up.  Need to stop his diuretics for 2 days completely (aldactone and lasix), then restart at lasix 40mg  in am, 80mg  in pm. And restart aldactone at 100mg  in am and 100mg  in pm.  He should not need a refill but needs to be very clear about these new doses.  He needs bmet next wed/thursday (can be done at home and results sent here).

## 2011-12-03 ENCOUNTER — Encounter (HOSPITAL_COMMUNITY): Payer: Self-pay | Admitting: Emergency Medicine

## 2011-12-03 ENCOUNTER — Other Ambulatory Visit: Payer: Self-pay

## 2011-12-03 ENCOUNTER — Inpatient Hospital Stay (HOSPITAL_COMMUNITY)
Admission: EM | Admit: 2011-12-03 | Discharge: 2011-12-12 | DRG: 432 | Disposition: A | Discharge status: Skilled Nursing Facility | Payer: 59 | Attending: Internal Medicine | Admitting: Internal Medicine

## 2011-12-03 ENCOUNTER — Emergency Department (HOSPITAL_COMMUNITY): Payer: 59

## 2011-12-03 DIAGNOSIS — N183 Chronic kidney disease, stage 3 unspecified: Secondary | ICD-10-CM | POA: Diagnosis present

## 2011-12-03 DIAGNOSIS — E871 Hypo-osmolality and hyponatremia: Secondary | ICD-10-CM

## 2011-12-03 DIAGNOSIS — I2510 Atherosclerotic heart disease of native coronary artery without angina pectoris: Secondary | ICD-10-CM | POA: Diagnosis present

## 2011-12-03 DIAGNOSIS — E875 Hyperkalemia: Secondary | ICD-10-CM | POA: Diagnosis present

## 2011-12-03 DIAGNOSIS — M1610 Unilateral primary osteoarthritis, unspecified hip: Secondary | ICD-10-CM | POA: Diagnosis present

## 2011-12-03 DIAGNOSIS — E860 Dehydration: Secondary | ICD-10-CM

## 2011-12-03 DIAGNOSIS — Z96659 Presence of unspecified artificial knee joint: Secondary | ICD-10-CM

## 2011-12-03 DIAGNOSIS — I129 Hypertensive chronic kidney disease with stage 1 through stage 4 chronic kidney disease, or unspecified chronic kidney disease: Secondary | ICD-10-CM | POA: Diagnosis present

## 2011-12-03 DIAGNOSIS — I509 Heart failure, unspecified: Secondary | ICD-10-CM | POA: Diagnosis present

## 2011-12-03 DIAGNOSIS — K7290 Hepatic failure, unspecified without coma: Secondary | ICD-10-CM | POA: Diagnosis present

## 2011-12-03 DIAGNOSIS — M109 Gout, unspecified: Secondary | ICD-10-CM | POA: Diagnosis present

## 2011-12-03 DIAGNOSIS — N390 Urinary tract infection, site not specified: Secondary | ICD-10-CM

## 2011-12-03 DIAGNOSIS — Z96649 Presence of unspecified artificial hip joint: Secondary | ICD-10-CM

## 2011-12-03 DIAGNOSIS — I252 Old myocardial infarction: Secondary | ICD-10-CM

## 2011-12-03 DIAGNOSIS — M129 Arthropathy, unspecified: Secondary | ICD-10-CM | POA: Diagnosis present

## 2011-12-03 DIAGNOSIS — E119 Type 2 diabetes mellitus without complications: Secondary | ICD-10-CM | POA: Diagnosis present

## 2011-12-03 DIAGNOSIS — R4182 Altered mental status, unspecified: Secondary | ICD-10-CM

## 2011-12-03 DIAGNOSIS — R627 Adult failure to thrive: Secondary | ICD-10-CM | POA: Diagnosis present

## 2011-12-03 DIAGNOSIS — N039 Chronic nephritic syndrome with unspecified morphologic changes: Secondary | ICD-10-CM | POA: Diagnosis present

## 2011-12-03 DIAGNOSIS — E039 Hypothyroidism, unspecified: Secondary | ICD-10-CM | POA: Diagnosis present

## 2011-12-03 DIAGNOSIS — K7460 Unspecified cirrhosis of liver: Secondary | ICD-10-CM | POA: Diagnosis present

## 2011-12-03 DIAGNOSIS — N179 Acute kidney failure, unspecified: Secondary | ICD-10-CM | POA: Diagnosis present

## 2011-12-03 DIAGNOSIS — K760 Fatty (change of) liver, not elsewhere classified: Secondary | ICD-10-CM | POA: Diagnosis present

## 2011-12-03 DIAGNOSIS — G2581 Restless legs syndrome: Secondary | ICD-10-CM | POA: Diagnosis present

## 2011-12-03 DIAGNOSIS — I10 Essential (primary) hypertension: Secondary | ICD-10-CM | POA: Diagnosis present

## 2011-12-03 DIAGNOSIS — Z794 Long term (current) use of insulin: Secondary | ICD-10-CM

## 2011-12-03 DIAGNOSIS — R5381 Other malaise: Secondary | ICD-10-CM | POA: Diagnosis present

## 2011-12-03 DIAGNOSIS — E079 Disorder of thyroid, unspecified: Secondary | ICD-10-CM | POA: Diagnosis present

## 2011-12-03 DIAGNOSIS — D61818 Other pancytopenia: Secondary | ICD-10-CM | POA: Diagnosis present

## 2011-12-03 DIAGNOSIS — B9620 Unspecified Escherichia coli [E. coli] as the cause of diseases classified elsewhere: Secondary | ICD-10-CM | POA: Diagnosis present

## 2011-12-03 DIAGNOSIS — D649 Anemia, unspecified: Secondary | ICD-10-CM | POA: Diagnosis present

## 2011-12-03 DIAGNOSIS — K5900 Constipation, unspecified: Secondary | ICD-10-CM | POA: Diagnosis not present

## 2011-12-03 DIAGNOSIS — N189 Chronic kidney disease, unspecified: Secondary | ICD-10-CM | POA: Diagnosis present

## 2011-12-03 DIAGNOSIS — D631 Anemia in chronic kidney disease: Secondary | ICD-10-CM | POA: Diagnosis present

## 2011-12-03 DIAGNOSIS — A498 Other bacterial infections of unspecified site: Secondary | ICD-10-CM | POA: Diagnosis present

## 2011-12-03 DIAGNOSIS — K7682 Hepatic encephalopathy: Secondary | ICD-10-CM | POA: Diagnosis present

## 2011-12-03 LAB — COMPREHENSIVE METABOLIC PANEL WITH GFR
Albumin: 2.7 g/dL — ABNORMAL LOW (ref 3.5–5.2)
Alkaline Phosphatase: 120 U/L — ABNORMAL HIGH (ref 39–117)
BUN: 77 mg/dL — ABNORMAL HIGH (ref 6–23)
Calcium: 10 mg/dL (ref 8.4–10.5)
GFR calc Af Amer: 51 mL/min — ABNORMAL LOW (ref >90)
Potassium: 4.4 meq/L (ref 3.5–5.1)
Sodium: 123 meq/L — ABNORMAL LOW (ref 135–145)
Total Protein: 7.3 g/dL (ref 6.0–8.3)

## 2011-12-03 LAB — URINALYSIS, ROUTINE W REFLEX MICROSCOPIC
Bilirubin Urine: NEGATIVE
Glucose, UA: NEGATIVE mg/dL
Hgb urine dipstick: NEGATIVE
Ketones, ur: NEGATIVE mg/dL
Nitrite: POSITIVE — AB
Protein, ur: NEGATIVE mg/dL
Specific Gravity, Urine: 1.015 (ref 1.005–1.030)
Urobilinogen, UA: 0.2 mg/dL (ref 0.0–1.0)
pH: 6 (ref 5.0–8.0)

## 2011-12-03 LAB — COMPREHENSIVE METABOLIC PANEL
ALT: 37 U/L (ref 0–53)
AST: 44 U/L — ABNORMAL HIGH (ref 0–37)
CO2: 27 mEq/L (ref 19–32)
Chloride: 85 mEq/L — ABNORMAL LOW (ref 96–112)
Creatinine, Ser: 1.63 mg/dL — ABNORMAL HIGH (ref 0.50–1.35)
GFR calc non Af Amer: 44 mL/min — ABNORMAL LOW (ref >90)
Glucose, Bld: 129 mg/dL — ABNORMAL HIGH (ref 70–99)
Total Bilirubin: 2.1 mg/dL — ABNORMAL HIGH (ref 0.3–1.2)

## 2011-12-03 LAB — DIFFERENTIAL
Basophils Absolute: 0 10*3/uL (ref 0.0–0.1)
Basophils Relative: 0 % (ref 0–1)
Eosinophils Absolute: 0.1 K/uL (ref 0.0–0.7)
Eosinophils Relative: 1 % (ref 0–5)
Lymphocytes Relative: 8 % — ABNORMAL LOW (ref 12–46)
Lymphs Abs: 1 10*3/uL (ref 0.7–4.0)
Monocytes Absolute: 0.8 10*3/uL (ref 0.1–1.0)
Monocytes Relative: 7 % (ref 3–12)
Neutro Abs: 10.1 10*3/uL — ABNORMAL HIGH (ref 1.7–7.7)
Neutrophils Relative %: 84 % — ABNORMAL HIGH (ref 43–77)

## 2011-12-03 LAB — POCT I-STAT TROPONIN I: Troponin i, poc: 0.03 ng/mL (ref 0.00–0.08)

## 2011-12-03 LAB — URINE MICROSCOPIC-ADD ON

## 2011-12-03 LAB — CBC
HCT: 36.3 % — ABNORMAL LOW (ref 39.0–52.0)
Hemoglobin: 13.4 g/dL (ref 13.0–17.0)
MCH: 31.8 pg (ref 26.0–34.0)
MCHC: 36.9 g/dL — ABNORMAL HIGH (ref 30.0–36.0)
MCV: 86 fL (ref 78.0–100.0)
Platelets: 156 K/uL (ref 150–400)
RBC: 4.22 MIL/uL (ref 4.22–5.81)
RDW: 14.4 % (ref 11.5–15.5)
WBC: 12.1 10*3/uL — ABNORMAL HIGH (ref 4.0–10.5)

## 2011-12-03 LAB — AMMONIA: Ammonia: 47 umol/L (ref 11–60)

## 2011-12-03 LAB — GLUCOSE, CAPILLARY: Glucose-Capillary: 190 mg/dL — ABNORMAL HIGH (ref 70–99)

## 2011-12-03 MED ORDER — ONDANSETRON HCL 4 MG/2ML IJ SOLN: 4.0000 mg | Freq: Four times a day (QID) | INTRAMUSCULAR | Status: DC | PRN

## 2011-12-03 MED ORDER — PANTOPRAZOLE SODIUM 40 MG PO TBEC
40.0000 mg | DELAYED_RELEASE_TABLET | Freq: Every day | ORAL | Status: DC
  Administered 2011-12-04 – 2011-12-12 (×9): 40 mg via ORAL
  Filled 2011-12-03 (×9): qty 1

## 2011-12-03 MED ORDER — LEVOTHYROXINE SODIUM 50 MCG PO TABS
50.0000 ug | ORAL_TABLET | Freq: Every day | ORAL | Status: DC
  Administered 2011-12-04 – 2011-12-12 (×9): 50 ug via ORAL
  Filled 2011-12-03 (×9): qty 1

## 2011-12-03 MED ORDER — LACTULOSE 10 GM/15ML PO SOLN: 30.0000 g | Freq: Three times a day (TID) | ORAL | Status: DC

## 2011-12-03 MED ORDER — ADULT MULTIVITAMIN W/MINERALS CH
1.0000 | ORAL_TABLET | Freq: Every day | ORAL | Status: DC
  Administered 2011-12-03 – 2011-12-12 (×10): 1 via ORAL
  Filled 2011-12-03 (×10): qty 1

## 2011-12-03 MED ORDER — ACETAMINOPHEN 650 MG RE SUPP: 650.0000 mg | Freq: Four times a day (QID) | RECTAL | Status: DC | PRN

## 2011-12-03 MED ORDER — INSULIN ASPART 100 UNIT/ML ~~LOC~~ SOLN
3.0000 [IU] | Freq: Three times a day (TID) | SUBCUTANEOUS | Status: DC
  Administered 2011-12-04: 3 [IU] via SUBCUTANEOUS

## 2011-12-03 MED ORDER — OXYCODONE-ACETAMINOPHEN 5-325 MG PO TABS
1.0000 | ORAL_TABLET | Freq: Once | ORAL | Status: AC
  Administered 2011-12-03: 1 via ORAL
  Filled 2011-12-03: qty 1

## 2011-12-03 MED ORDER — ONDANSETRON HCL 4 MG PO TABS: 4.0000 mg | ORAL_TABLET | Freq: Four times a day (QID) | ORAL | Status: DC | PRN

## 2011-12-03 MED ORDER — OXYCODONE HCL 5 MG PO TABS
5.0000 mg | ORAL_TABLET | ORAL | Status: DC | PRN
  Administered 2011-12-03 – 2011-12-05 (×5): 5 mg via ORAL
  Filled 2011-12-03 (×7): qty 1

## 2011-12-03 MED ORDER — NITROGLYCERIN 0.4 MG SL SUBL: 0.4000 mg | SUBLINGUAL_TABLET | SUBLINGUAL | Status: DC | PRN

## 2011-12-03 MED ORDER — INSULIN ASPART 100 UNIT/ML ~~LOC~~ SOLN
0.0000 [IU] | Freq: Three times a day (TID) | SUBCUTANEOUS | Status: DC
  Administered 2011-12-04: 1 [IU] via SUBCUTANEOUS
  Administered 2011-12-04: 3 [IU] via SUBCUTANEOUS
  Administered 2011-12-05: 2 [IU] via SUBCUTANEOUS
  Administered 2011-12-05: 7 [IU] via SUBCUTANEOUS
  Administered 2011-12-05: 2 [IU] via SUBCUTANEOUS
  Administered 2011-12-06 (×2): 3 [IU] via SUBCUTANEOUS
  Filled 2011-12-03: qty 3

## 2011-12-03 MED ORDER — SODIUM CHLORIDE 0.9 % IV BOLUS (SEPSIS)
500.0000 mL | Freq: Once | INTRAVENOUS | Status: AC
  Administered 2011-12-03: 500 mL via INTRAVENOUS

## 2011-12-03 MED ORDER — METOPROLOL SUCCINATE 12.5 MG HALF TABLET
12.5000 mg | ORAL_TABLET | Freq: Every day | ORAL | Status: DC
  Administered 2011-12-03 – 2011-12-12 (×10): 12.5 mg via ORAL
  Filled 2011-12-03 (×10): qty 1

## 2011-12-03 MED ORDER — SODIUM CHLORIDE 0.9 % IJ SOLN: 3.0000 mL | INTRAMUSCULAR | Status: DC | PRN

## 2011-12-03 MED ORDER — ASPIRIN 81 MG PO CHEW
81.0000 mg | CHEWABLE_TABLET | Freq: Every day | ORAL | Status: DC
  Administered 2011-12-03 – 2011-12-12 (×10): 81 mg via ORAL
  Filled 2011-12-03 (×10): qty 1

## 2011-12-03 MED ORDER — DEXTROSE 5 % IV SOLN
1.0000 g | INTRAVENOUS | Status: DC
  Administered 2011-12-04 – 2011-12-05 (×2): 1 g via INTRAVENOUS
  Filled 2011-12-03 (×3): qty 10

## 2011-12-03 MED ORDER — INSULIN ASPART 100 UNIT/ML ~~LOC~~ SOLN
0.0000 [IU] | Freq: Every day | SUBCUTANEOUS | Status: DC
  Administered 2011-12-04: 3 [IU] via SUBCUTANEOUS

## 2011-12-03 MED ORDER — SODIUM CHLORIDE 0.9 % IJ SOLN
3.0000 mL | Freq: Two times a day (BID) | INTRAMUSCULAR | Status: DC
  Administered 2011-12-04 – 2011-12-06 (×5): 3 mL via INTRAVENOUS

## 2011-12-03 MED ORDER — LACTULOSE 10 GM/15ML PO SOLN
30.0000 g | Freq: Three times a day (TID) | ORAL | Status: DC
  Administered 2011-12-03 – 2011-12-04 (×5): 30 g via ORAL
  Filled 2011-12-03 (×9): qty 45

## 2011-12-03 MED ORDER — RIFAXIMIN 550 MG PO TABS
550.0000 mg | ORAL_TABLET | Freq: Two times a day (BID) | ORAL | Status: DC
  Administered 2011-12-03 – 2011-12-12 (×18): 550 mg via ORAL
  Filled 2011-12-03 (×19): qty 1

## 2011-12-03 MED ORDER — MORPHINE SULFATE 2 MG/ML IJ SOLN: 2.0000 mg | INTRAMUSCULAR | Status: DC | PRN

## 2011-12-03 MED ORDER — ACETAMINOPHEN 325 MG PO TABS: 650.0000 mg | ORAL_TABLET | Freq: Four times a day (QID) | ORAL | Status: DC | PRN

## 2011-12-03 MED ORDER — INSULIN DETEMIR 100 UNIT/ML ~~LOC~~ SOLN
40.0000 [IU] | Freq: Every day | SUBCUTANEOUS | Status: DC
  Administered 2011-12-03 – 2011-12-10 (×8): 40 [IU] via SUBCUTANEOUS
  Filled 2011-12-03 (×2): qty 3

## 2011-12-03 MED ORDER — DEXTROSE 5 % IV SOLN
1.0000 g | Freq: Once | INTRAVENOUS | Status: AC
  Administered 2011-12-03: 1 g via INTRAVENOUS
  Filled 2011-12-03: qty 10

## 2011-12-03 MED ORDER — ROPINIROLE HCL 1 MG PO TABS
1.0000 mg | ORAL_TABLET | Freq: Every day | ORAL | Status: DC
  Administered 2011-12-03 – 2011-12-11 (×9): 1 mg via ORAL
  Filled 2011-12-03 (×10): qty 1

## 2011-12-03 MED ORDER — SODIUM CHLORIDE 0.9 % IV SOLN: 250.0000 mL | INTRAVENOUS | Status: DC | PRN

## 2011-12-03 MED ORDER — OXYCODONE HCL 5 MG PO TABS
10.0000 mg | ORAL_TABLET | Freq: Once | ORAL | Status: AC
  Administered 2011-12-04: 10 mg via ORAL
  Filled 2011-12-03: qty 2

## 2011-12-03 NOTE — ED Notes (Signed)
Daughter states she went to patients home today and found him laying in bed, states he was shaky, somewhat confused, and having a hard time speaking. States he was recently hospitalized for encehalopathy. States that he is supposed to take lactalose three times daily for ammonia levels, but she is unsure he is taking it properly.

## 2011-12-03 NOTE — H&P (Signed)
Hospital Admission Note Date: 12/03/2011  Patient name: [name] Medical record number: [id number] Date of birth: 05/05/1951 Age: 61 y.o. Gender: male PCP: REED,[name]  Attending physician: [name] POA: Daughter, [name] [phone] (cell) Code Status: Full  Chief Complaint: Altered mental status  History of Present Illness: [name] is an 61 y.o. 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  The patient's wife called EMS due to altered mental status.  He has not been taking the lactulose 3 times a day as prescribed.  [name] reports that he hasn't been sleeping well.  He cannot provide many details about what triggered his wife to call EMS.  No family is currently present.  According to Dr. Harmon [name] notes, the patient's wife tried to reach him by telephone today, and when he did not answer the phone, sent her daughter to check on him and his daughter found him excessively somnolent and drowsy, and not making much sense with verbalization.  She then called EMS.     Past Medical History Past Medical History  Diagnosis Date  . Hypertension   . Type II or unspecified type diabetes mellitus without mention of complication, not stated as uncontrolled   . Thyroid disease   . Cirrhosis, non-alcoholic   . Morbid obesity   . Pancytopenia   . Fatty liver   . Coronary artery disease   . Myocardial infarction     "HEART INCIDENT " OCT 2012  . Angina     RARE USE OF NTG  . CHF (congestive heart failure)     JUNE 2012  . Arthritis   . Gout 35 YRS AGO  . Cellulitis of left leg   . Cirrhosis of liver     NONALCOHOLIC  . Nocturia   . Elevated BUN   . Elevated serum creatinine   . Serum ammonia increased   . Splenomegaly   . Platelets decreased   . Thyroid disorder     PT DOES NOT KNOW IF LOW OR HIGH  . Obstructive sleep apnea     USES O2 WITH C - PAP SETTING FOR C-PA IS 16  . Chronic kidney disease  (CKD), stage III (moderate)     Past Surgical History Past Surgical History  Procedure Date  . Coronary stent placement     2 CARDIAC STENTS OCT 08/2011  . Total knee arthroplasty 10/14/2011  . Total hip arthroplasty 10/14/2011    Procedure: TOTAL HIP ARTHROPLASTY ANTERIOR APPROACH;  Surgeon: Kathryne Hitch;  Location: [facility];  Service: Orthopedics;  Laterality: Right;  . Joint replacement   . Appendectomy 1968  . Tonsillectomy 1957  . Cardiac catheterization 09/08/2011    Meds: Prior to Admission medications   Medication Sig Start Date End Date Taking? Authorizing Provider  aspirin 81 MG tablet Take 81 mg by mouth every morning.    Yes Historical Provider, MD  docusate sodium (COLACE) 100 MG capsule Take 100 mg by mouth 2 (two) times daily.     Yes Historical Provider, MD  furosemide (LASIX) 40 MG tablet Take by mouth 2 (two) times daily. Take 40mg   Tablet every morning and 80mg  every evening 90 day supply  11/10/11  Yes Meredith Pel, NP  insulin aspart (NOVOLOG) 100 UNIT/ML injection Inject 6 Units into the skin 3 (three) times daily with meals. 10/31/11 10/30/12 Yes Novlet Jarrett-Davis  insulin detemir (LEVEMIR) 100 UNIT/ML injection Inject 40 Units  into the skin at bedtime. 10/31/11  Yes Novlet Jarrett-Davis  lactulose (CHRONULAC) 10 GM/15ML solution Take 30 g by mouth 3 (three) times daily.     Yes Historical Provider, MD  levothyroxine (SYNTHROID, LEVOTHROID) 50 MCG tablet Take 50 mcg by mouth every morning.    Yes Historical Provider, MD  lidocaine (LIDODERM) 5 % Place 1 patch onto the skin daily as needed. Remove & Discard patch within 12 hours or as directed by MD..PAIN   Yes Historical Provider, MD  methocarbamol (ROBAXIN) 500 MG tablet Take 500 mg by mouth every 12 (twelve) hours as needed.     Yes Historical Provider, MD  metoprolol succinate (TOPROL-XL) 25 MG 24 hr tablet 12.5 mg every morning.    Yes Historical Provider, MD  Multiple Vitamin (MULTIVITAMIN)  tablet Take 1 tablet by mouth daily.     Yes Historical Provider, MD  nystatin (NYSTOP) 100000 UNIT/GM POWD Apply topically daily. topically  10/31/11  Yes Novlet Jarrett-Davis  oxycodone (OXY-IR) 5 MG capsule Take 5 mg by mouth every 4 (four) hours as needed. Patient states that he takes this medication 4 to 6 times a day    Yes Historical Provider, MD  pantoprazole (PROTONIX) 40 MG tablet Take 40 mg by mouth daily at 12 noon.   10/31/11 10/30/12 Yes Novlet Jarrett-Davis  rifaximin (XIFAXAN) 550 MG TABS Take 1 tablet (550 mg total) by mouth 2 (two) times daily. 11/23/11  Yes Rob Bunting, MD  rOPINIRole (REQUIP) 1 MG tablet Take 1 tablet (1 mg total) by mouth at bedtime. 10/31/11 10/30/12 Yes Novlet Jarrett-Davis  spironolactone (ALDACTONE) 50 MG tablet Take 100 mg by mouth 2 (two) times daily.   11/10/11  Yes Meredith Pel, NP  nitroGLYCERIN (NITROSTAT) 0.4 MG SL tablet Place 0.4 mg under the tongue every 5 (five) minutes as needed. CHEST PAIN    Historical Provider, MD    Allergies: Crestor and Statins  Social History: History   Social History  . Marital Status: Married    Spouse Name: N/A    Number of Children: 1  . Years of Education: 14   Occupational History  . Optician, dispensing (retired)   . Masters degree in [employer]    Social History Main Topics  . Smoking status: Never Smoker   . Smokeless tobacco: Never Used  . Alcohol Use: No  . Drug Use: No  . Sexually Active: No   Other Topics Concern  . Not on file   Social History Narrative   Married, lives with wife in [place].  Ambulates with walker.    Family History:  Family History  Problem Relation Age of Onset  . Colon cancer Neg Hx   . Diabetes Mother   . Diabetes Father     Review of Systems: Constitutional: No F/C, no weight loss, appetite normal. HEENT: No complaints.  CV: No chest pain or palpitations.  Resp: No SOB or cough.  GU: Foley catheter in place, no dysuria.  MSK: Chronic back pain.  Neuro: No focal  neuro deficits. A comprehensive 14 point ROS otherwise unremarkable.  Physical Exam: Blood pressure 105/65, pulse 83, temperature 97.7 F (36.5 C), temperature source Oral, resp. rate 20, height 5\' 9"  (1.753 m), weight 111.8 kg (246 lb 7.6 oz), SpO2 99.00%. BP 105/65  Pulse 83  Temp(Src) 97.7 F (36.5 C) (Oral)  Resp 20  Ht 5\' 9"  (1.753 m)  Wt 111.8 kg (246 lb 7.6 oz)  BMI 36.40 kg/m2  SpO2 99%  General Appearance:  Alert, cooperative, no distress, appears stated age  Head:    Normocephalic, without obvious abnormality, atraumatic  Eyes:    PERRL, conjunctiva/corneas clear, EOM's intact     Ears:    Normal external ear canals, both ears  Nose:   Nares normal, septum midline, mucosa normal, no drainage    or sinus tenderness  Throat:   Lips, mucosa, and tongue normal; poor dentition,  gums normal  Neck:   Supple, symmetrical, trachea midline, no adenopathy;       thyroid:  No enlargement/tenderness/nodules; no carotid   bruit or JVD  Lungs:     Clear to auscultation bilaterally, respirations unlabored  Chest wall:    No tenderness or deformity  Heart:    Regular rate and rhythm, S1 and S2 normal, no murmur, rub   or gallop  Abdomen:     Softly distended, non-tender, bowel sounds active all four quadrants, no masses, no organomegaly  Extremities:   Extremities normal, atraumatic, no cyanosis, trace edema  Pulses:   2+ and symmetric all extremities  Skin:   Skin color, texture, turgor normal, no rashes or lesions  Lymph nodes:   Cervical, supraclavicular, and axillary nodes normal  Neurologic:   Alert and oriented x 3.  Non-focal.  Mildly tremulous.   Lab results: Basic Metabolic Panel:  Lab 12/03/11 1610  NA 123*  K 4.4  CL 85*  CO2 27  GLUCOSE 129*  BUN 77*  CREATININE 1.63*  CALCIUM 10.0  MG --  PHOS --   GFR Estimated Creatinine Clearance: 58.6 ml/min (by C-G formula based on Cr of 1.63). Liver Function Tests:  Lab 12/03/11 1153  AST 44*  ALT 37  ALKPHOS  120*  BILITOT 2.1*  PROT 7.3  ALBUMIN 2.7*   No results found for this basename: LIPASE:5,AMYLASE:5 in the last 168 hours  Lab 12/03/11 1154  AMMONIA 47   Coagulation profile No results found for this basename: INR:5,PROTIME:5 in the last 168 hours  CBC:  Lab 12/03/11 1153  WBC 12.1*  NEUTROABS 10.1*  HGB 13.4  HCT 36.3*  MCV 86.0  PLT 156   CBG:  Lab 12/03/11 1653  GLUCAP 128*    Imaging results:  Ct Head Wo Contrast  12/03/2011  *RADIOLOGY REPORT*  Clinical Data: Altered mental status  CT HEAD WITHOUT CONTRAST  Technique:  Contiguous axial images were obtained from the base of the skull through the vertex without contrast.  Comparison: [place] CT head dated 08/28/2011  Findings: No evidence of parenchymal hemorrhage or extra-axial fluid collection. No mass lesion, mass effect, or midline shift.  No CT evidence of acute infarction.  Cerebral volume is age appropriate.  No ventriculomegaly.  The visualized paranasal sinuses are essentially clear. The mastoid air cells are unopacified.  No evidence of calvarial fracture.  IMPRESSION: No evidence of acute intracranial abnormality.  Original Report Authenticated By: Charline Bills, M.D.    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   Active Problems:  AODM Continue Levemir, 3 units of Novalog Q AC.  Add SSI, insulin sensitive scale.  RLS (restless legs syndrome) Resume Requip.  Essential hypertension, benign Monitor BP and adjust meds if needed.  Morbid obesity Heart healthy diet.  Arthritis pain of hip Pain meds as needed.  CRI (chronic renal insufficiency) / ARF Hold diuretics until renal function back to baseline.  Anemia Chronic, likely  secondary to CKD. Prophylaxis SCDs  UTI F/U cultures.  On empiric Rocephin.  Time Spent On Admission: 1  hour.  [name],CHRISTINA 12/03/2011, 6:04 PM Pager [phone]

## 2011-12-03 NOTE — ED Notes (Signed)
Called for altered LOC per family.  Upon EMS arrival patient was alert, oriented x's 3.  Offered no c/o of anything. No CP, SHOB, n/v/d.  Denies fever.  C/o problems with urination which is chronic.

## 2011-12-03 NOTE — ED Provider Notes (Addendum)
History     CSN: [id number]  Arrival date & time 12/03/11  1107   First MD Initiated Contact with Patient 12/03/11 1111      Chief Complaint  Patient presents with  . Altered Mental Status    (Consider location/radiation/quality/duration/timing/severity/associated sxs/prior treatment) HPI Comments: Patient presents to the emergency department by EMS where his spouse called 911 do to alteration in mental status. 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 The patient is supposed to be taking lactulose 3 times per day, however the patient reports that he has been only taking it twice a day. Apparently his last bowel movement was on Thursday which is 2 days ago. Patient's daughter reports that he gets this way sometimes when he has not had bowel movements on a regular basis. The patient has a history of recent right hip surgery approximately 6 weeks ago, did have brief stays in rehabilitation, however has been at home for the last several days. Patient denies any fever or chills. He notes very minimal pain to his right hip area which is baseline for him. 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 Patient denies headaches, blurred vision, neck stiffness, rash, chest pain, shortness of breath, abdominal pain, back pain. Per the patient's daughter, the patient had been excessively somnolent and sleeping a lot in the last 24 hours. The patient spells works the night shift and he did not respond to her when she called to check on him. Once the spouse was not able to reach him, she called the patient's daughter who went to check on him at home. Patient's daughter found him excessively somnolent and drowsy, and not making much sense with verbalization. Patient admits that it has been somewhat difficult for him to concentrate. They both deny any recent focal extremity numbness or weakness.  Patient is  a 61 y.o. male presenting with altered mental status. The history is provided by the patient, a relative and the EMS personnel.  Altered Mental Status    Past Medical History  Diagnosis Date  . Hypertension   . Type II or unspecified type diabetes mellitus without mention of complication, not stated as uncontrolled   . Thyroid disease   . Cirrhosis, non-alcoholic   . Morbid obesity   . Pancytopenia   . Fatty liver   . Coronary artery disease   . Myocardial infarction     "HEART INCIDENT " OCT 2012  . Angina     RARE USE OF NTG  . CHF (congestive heart failure)     JUNE 2012  . Arthritis   . Gout 35 YRS AGO  . Cellulitis of left leg   . Cirrhosis of liver     NONALCOHOLIC  . Nocturia   . Elevated BUN   . Elevated serum creatinine   . Serum ammonia increased   . Splenomegaly   . Platelets decreased   . Thyroid disorder     PT DOES NOT KNOW IF LOW OR HIGH  . Obstructive sleep apnea     USES O2 WITH C - PAP SETTING FOR C-PA IS 16  . Chronic kidney disease (CKD), stage III (moderate)     Past Surgical History  Procedure Date  . Coronary stent placement     2 CARDIAC STENTS OCT 08/2011  . Total knee arthroplasty 10/14/2011  . Total hip arthroplasty 10/14/2011    Procedure: TOTAL HIP ARTHROPLASTY ANTERIOR APPROACH;  Surgeon: Kathryne Hitch;  Location: [facility];  Service: Orthopedics;  Laterality: Right;  . Joint replacement   . Appendectomy 1968  . Tonsillectomy 1957  . Cardiac catheterization 09/08/2011    Family History  Problem Relation Age of Onset  . Colon cancer Neg Hx   . Diabetes Mother   . Diabetes Father     History  Substance Use Topics  . Smoking status: Never Smoker   . Smokeless tobacco: Never Used  . Alcohol Use: No      Review of Systems  Psychiatric/Behavioral: Positive for altered mental status.    Allergies  Crestor and Statins  Home Medications   Current Outpatient Rx  Name Route Sig Dispense Refill  . ASPIRIN 81 MG PO  TABS Oral Take 81 mg by mouth every morning.     Marland Kitchen DOCUSATE SODIUM 100 MG PO CAPS Oral Take 100 mg by mouth 2 (two) times daily.      . FUROSEMIDE 40 MG PO TABS Oral Take by mouth 2 (two) times daily. Take 40mg   Tablet every morning and 80mg  every evening 90 day supply     . INSULIN ASPART 100 UNIT/ML [place] SOLN Subcutaneous Inject 6 Units into the skin 3 (three) times daily with meals. 1 vial 1  . INSULIN DETEMIR 100 UNIT/ML [place] SOLN Subcutaneous Inject 40 Units into the skin at bedtime. 10 mL 0  . LACTULOSE 10 GM/15ML PO SOLN Oral Take 30 g by mouth 3 (three) times daily.      Marland Kitchen LEVOTHYROXINE SODIUM 50 MCG PO TABS Oral Take 50 mcg by mouth every morning.     Marland Kitchen LIDOCAINE 5 % EX PTCH Transdermal Place 1 patch onto the skin daily as needed. Remove & Discard patch within 12 hours or as directed by MD..PAIN    . METHOCARBAMOL 500 MG PO TABS Oral Take 500 mg by mouth every 12 (twelve) hours as needed.      Marland Kitchen METOPROLOL SUCCINATE ER 25 MG PO TB24  12.5 mg every morning.     Marland Kitchen ONE-DAILY MULTI VITAMINS PO TABS Oral Take 1 tablet by mouth daily.      . NYSTOP 100000 UNIT/GM EX POWD Topical Apply topically daily. topically     . OXYCODONE HCL 5 MG PO CAPS Oral Take 5 mg by mouth every 4 (four) hours as needed. Patient states that he takes this medication 4 to 6 times a day     . PANTOPRAZOLE SODIUM 40 MG PO TBEC Oral Take 40 mg by mouth daily at 12 noon.      Marland Kitchen RIFAXIMIN 550 MG PO TABS Oral Take 1 tablet (550 mg total) by mouth 2 (two) times daily. 60 tablet 11  . ROPINIROLE HCL 1 MG PO TABS Oral Take 1 tablet (1 mg total) by mouth at bedtime. 30 tablet 0  . SPIRONOLACTONE 50 MG PO TABS Oral Take 100 mg by mouth 2 (two) times daily.      Marland Kitchen NITROGLYCERIN 0.4 MG SL SUBL Sublingual Place 0.4 mg under the tongue every 5 (five) minutes as needed. CHEST PAIN      BP 102/66  Pulse 76  Temp(Src) 97.4 F (36.3 C) (Oral)  Resp 16  SpO2 95%  Physical Exam  ED Course  Procedures (including critical care  time)  Labs Reviewed  CBC - Abnormal; Notable for the following:    WBC 12.1 (*)    HCT 36.3 (*)    MCHC 36.9 (*)    All other components  within normal limits  DIFFERENTIAL - Abnormal; Notable for the following:    Neutrophils Relative 84 (*)    Neutro Abs 10.1 (*)    Lymphocytes Relative 8 (*)    All other components within normal limits  COMPREHENSIVE METABOLIC PANEL - Abnormal; Notable for the following:    Sodium 123 (*)    Chloride 85 (*)    Glucose, Bld 129 (*)    BUN 77 (*)    Creatinine, Ser 1.63 (*)    Albumin 2.7 (*)    AST 44 (*)    Alkaline Phosphatase 120 (*)    Total Bilirubin 2.1 (*)    GFR calc non Af Amer 44 (*)    GFR calc Af Amer 51 (*)    All other components within normal limits  URINALYSIS, ROUTINE W REFLEX MICROSCOPIC - Abnormal; Notable for the following:    APPearance CLOUDY (*)    Nitrite POSITIVE (*)    Leukocytes, UA MODERATE (*)    All other components within normal limits  URINE MICROSCOPIC-ADD ON - Abnormal; Notable for the following:    Bacteria, UA MANY (*)    All other components within normal limits  AMMONIA  POCT I-STAT TROPONIN I  URINE CULTURE  I-STAT TROPONIN I   Ct Head Wo Contrast  12/03/2011  *RADIOLOGY REPORT*  Clinical Data: Altered mental status  CT HEAD WITHOUT CONTRAST  Technique:  Contiguous axial images were obtained from the base of the skull through the vertex without contrast.  Comparison: [place] CT head dated 08/28/2011  Findings: No evidence of parenchymal hemorrhage or extra-axial fluid collection. No mass lesion, mass effect, or midline shift.  No CT evidence of acute infarction.  Cerebral volume is age appropriate.  No ventriculomegaly.  The visualized paranasal sinuses are essentially clear. The mastoid air cells are unopacified.  No evidence of calvarial fracture.  IMPRESSION: No evidence of acute intracranial abnormality.  Original Report Authenticated By: Charline Bills, M.D.     1. Dehydration   2.  Hyponatremia   3. Urinary tract infection   4. Altered mental status     RA sat of 98% is normal  ECG at time 11:37 shows NSR at rate 81, normal axis, normal intervals.  No ST or T wave abn's.    MDM  Pt with h/o hepatic encephalopathy, will check ammonia level and presumptively, I suspect taht is the case since pt is not febrile, BP is adequate, he is alert and oriented to self, place and date is close.  Some memory lapses and concentration difficulty is noted.  Pt has not had BM in 2+ days.  Will give dose of lactulose here.  Pt is on narcotics, reports last dose was more than 12 hours ago.  I reviewed pt's old notes and labs.      1:24 PM Ammonia is not heavily elevated.  Pt's Na is low at 123 and BUN is very elevated at 77 suggesting prerenal dehydration status.  UA suggests possibly UTI, although only 3-6 WBC's are present.  Will send culture, start IV abx for UTI, obtain head CT since ammonia is not elevated and will need admission.     1:54 PM I reviewed pt's head CT: no obv mass effect or hemorrhage noted.  No fractures.  Will admit to medicine.       2:04 PM Spoke to Dr. Darnelle Catalan who will admit, asks that I write basic orders for pt to go to the floor with.  Gavin Pound. Oletta Lamas, MD 12/03/11 1406  Gavin Pound. Takari Duncombe, MD 12/03/11 1408

## 2011-12-03 NOTE — ED Notes (Signed)
Report given to Pelican Marsh, RN on [address].

## 2011-12-04 LAB — GLUCOSE, CAPILLARY
Glucose-Capillary: 108 mg/dL — ABNORMAL HIGH (ref 70–99)
Glucose-Capillary: 257 mg/dL — ABNORMAL HIGH (ref 70–99)

## 2011-12-04 NOTE — Progress Notes (Addendum)
PATIENT DETAILS Name: [name] Age: 61 y.o. Sex: male Date of Birth: [birth year] Admit Date: 12/03/2011 WUJ:WJXB,JYNWGNF L., DO Emergency contacts: Wife Eunice Blase [phone] & Daughter, Vernona Rieger [phone] (cell)  CONSULTS: None  Interval History: [name] is an 61 y.o. 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 He has had multiple hospitalizations for similar complaints.  He was put on lactulose and Xifaxan on admission.  His UTI is empirically being treated with Cipro.    ROS: [name] is a bit sleepy this morning.  Denies dyspnea.  Had back pain last night which was relieved with pain medicine.   Objective: Vital signs in last 24 hours: Temp:  [97.3 F (36.3 C)-98.1 F (36.7 C)] 98.1 F (36.7 C) (01/06 0645) Pulse Rate:  [76-83] 79  (01/06 0645) Resp:  [16-20] 20  (01/06 0645) BP: (97-107)/(56-74) 107/74 mmHg (01/06 0645) SpO2:  [95 %-99 %] 96 % (01/06 0645) Weight:  [111.8 kg (246 lb 7.6 oz)] 246 lb 7.6 oz (111.8 kg) (01/05 1654) Weight change:  Last BM Date: 12/01/11  Intake/Output from previous day:  Intake/Output Summary (Last 24 hours) at 12/04/11 1126 Last data filed at 12/04/11 1015  Gross per 24 hour  Intake    780 ml  Output   2650 ml  Net  -1870 ml     Physical Exam:  Gen:  NAD Cardiovascular:  RRR, No M/R/G Respiratory: Lungs CTAB Gastrointestinal: Abdomen softly distended. NT with normal active bowel sounds. Extremities: 1+ edema     Lab Results: Basic Metabolic Panel:  Lab 12/03/11 2952  NA 123*  K 4.4  CL 85*  CO2 27  GLUCOSE 129*  BUN 77*  CREATININE 1.63*  CALCIUM 10.0  MG --  PHOS --   GFR Estimated Creatinine Clearance: 58.6 ml/min (by C-G formula based on Cr of 1.63). Liver Function Tests:  Lab 12/03/11 1153  AST 44*  ALT 37  ALKPHOS 120*  BILITOT 2.1*  PROT 7.3  ALBUMIN 2.7*   No results found for this basename: LIPASE:5,AMYLASE:5 in the last  168 hours  Lab 12/03/11 1154  AMMONIA 47   Coagulation profile No results found for this basename: INR:5,PROTIME:5 in the last 168 hours  CBC:  Lab 12/03/11 1153  WBC 12.1*  NEUTROABS 10.1*  HGB 13.4  HCT 36.3*  MCV 86.0  PLT 156   CBG:  Lab 12/04/11 0733 12/03/11 2146 12/03/11 1653  GLUCAP 108* 190* 128*   Microbiology No results found for this or any previous visit (from the past 240 hour(s)).  No results found for this or any previous visit (from the past 240 hour(s)).  Studies/Results: Ct Head Wo Contrast  12/03/2011  *RADIOLOGY REPORT*  Clinical Data: Altered mental status  CT HEAD WITHOUT CONTRAST  Technique:  Contiguous axial images were obtained from the base of the skull through the vertex without contrast.  Comparison: [place] CT head dated 08/28/2011  Findings: No evidence of parenchymal hemorrhage or extra-axial fluid collection. No mass lesion, mass effect, or midline shift.  No CT evidence of acute infarction.  Cerebral volume is age appropriate.  No ventriculomegaly.  The visualized paranasal sinuses are essentially clear. The mastoid air cells are unopacified.  No evidence of calvarial fracture.  IMPRESSION: No evidence of acute intracranial abnormality.  Original Report Authenticated By: Charline Bills, M.D.    Medications: Scheduled Meds:   . aspirin  81 mg Oral Daily  . cefTRIAXone (  ROCEPHIN)  IV  1 g Intravenous Once  . cefTRIAXone (ROCEPHIN)  IV  1 g Intravenous Q24H  . insulin aspart  0-5 Units Subcutaneous QHS  . insulin aspart  0-9 Units Subcutaneous TID WC  . insulin aspart  3 Units Subcutaneous TID WC  . insulin detemir  40 Units Subcutaneous QHS  . lactulose  30 g Oral TID  . levothyroxine  50 mcg Oral QAC breakfast  . metoprolol succinate  12.5 mg Oral Daily  . mulitivitamin with minerals  1 tablet Oral Daily  . oxyCODONE  10 mg Oral Once  . oxyCODONE-acetaminophen  1 tablet Oral Once  . pantoprazole  40 mg Oral Q1200  . rifaximin  550  mg Oral BID  . rOPINIRole  1 mg Oral QHS  . sodium chloride  500 mL Intravenous Once  . sodium chloride  3 mL Intravenous Q12H  . DISCONTD: lactulose  30 g Oral TID   Continuous Infusions:  PRN Meds:.sodium chloride, acetaminophen, acetaminophen, morphine, nitroGLYCERIN, ondansetron (ZOFRAN) IV, ondansetron, oxyCODONE, sodium chloride Antibiotics: Anti-infectives     Start     Dose/Rate Route Frequency Ordered Stop   12/04/11 1500   cefTRIAXone (ROCEPHIN) 1 g in dextrose 5 % 50 mL IVPB        1 g 100 mL/hr over 30 Minutes Intravenous Every 24 hours 12/03/11 1815     12/03/11 2200   rifaximin (XIFAXAN) tablet 550 mg        550 mg Oral 2 times daily 12/03/11 1745     12/03/11 1330   cefTRIAXone (ROCEPHIN) 1 g in dextrose 5 % 50 mL IVPB        1 g 100 mL/hr over 30 Minutes Intravenous  Once 12/03/11 1326 12/03/11 1446           Assessment/Plan:  Principal Problem:  *Hepatic encephalopathy secondary to NAFLD (nonalcoholic fatty liver disease) and Cirrhosis  We will monitor the patient and resume lactulose TID dosing. He is also on Xifaxan.  Active Problems:  AODM  Continue Levemir, 3 units of Novalog Q AC.  SSI, insulin sensitive scale, was added 12/03/11. CBG 108-190 over past 24 hours. RLS (restless legs syndrome)  Resume Requip.  Essential hypertension, benign  Monitor BP and adjust meds if needed.  Morbid obesity  Heart healthy diet.  Arthritis pain of hip  Pain meds as needed.  CRI (chronic renal insufficiency) / ARF  Hold diuretics until renal function back to baseline.  Anemia  Chronic, likely secondary to CKD.  Prophylaxis  SCDs  UTI  F/U cultures. On empiric Rocephin.  I spoke with the patient's wife by telephone, updated her on the plan of care and answered all of her questions.  LOS: 1 day   Hillery Aldo, MD Pager [phone]  12/04/2011, 11:26 AM

## 2011-12-05 ENCOUNTER — Encounter: Payer: Self-pay | Admitting: Gastroenterology

## 2011-12-05 DIAGNOSIS — E860 Dehydration: Secondary | ICD-10-CM

## 2011-12-05 DIAGNOSIS — E871 Hypo-osmolality and hyponatremia: Secondary | ICD-10-CM | POA: Diagnosis present

## 2011-12-05 DIAGNOSIS — R627 Adult failure to thrive: Secondary | ICD-10-CM | POA: Diagnosis present

## 2011-12-05 DIAGNOSIS — K7460 Unspecified cirrhosis of liver: Principal | ICD-10-CM

## 2011-12-05 DIAGNOSIS — K7689 Other specified diseases of liver: Secondary | ICD-10-CM

## 2011-12-05 DIAGNOSIS — K7290 Hepatic failure, unspecified without coma: Secondary | ICD-10-CM

## 2011-12-05 DIAGNOSIS — R4182 Altered mental status, unspecified: Secondary | ICD-10-CM

## 2011-12-05 DIAGNOSIS — E039 Hypothyroidism, unspecified: Secondary | ICD-10-CM | POA: Diagnosis present

## 2011-12-05 LAB — BASIC METABOLIC PANEL
Calcium: 9.3 mg/dL (ref 8.4–10.5)
Creatinine, Ser: 1.11 mg/dL (ref 0.50–1.35)
GFR calc non Af Amer: 70 mL/min — ABNORMAL LOW (ref >90)
Glucose, Bld: 230 mg/dL — ABNORMAL HIGH (ref 70–99)
Sodium: 128 mEq/L — ABNORMAL LOW (ref 135–145)

## 2011-12-05 LAB — URINE CULTURE
Colony Count: >=100000
Culture  Setup Time: 201301051900

## 2011-12-05 LAB — GLUCOSE, CAPILLARY
Glucose-Capillary: 190 mg/dL — ABNORMAL HIGH (ref 70–99)
Glucose-Capillary: 182 mg/dL — ABNORMAL HIGH (ref 70–99)

## 2011-12-05 LAB — CBC
MCH: 30.6 pg (ref 26.0–34.0)
MCHC: 34.8 g/dL (ref 30.0–36.0)
Platelets: 154 10*3/uL (ref 150–400)
RBC: 3.4 MIL/uL — ABNORMAL LOW (ref 4.22–5.81)
RDW: 14.4 % (ref 11.5–15.5)

## 2011-12-05 MED ORDER — FUROSEMIDE 40 MG PO TABS
40.0000 mg | ORAL_TABLET | Freq: Two times a day (BID) | ORAL | Status: DC
  Administered 2011-12-05 – 2011-12-07 (×4): 40 mg via ORAL
  Filled 2011-12-05 (×5): qty 1

## 2011-12-05 MED ORDER — SPIRONOLACTONE 100 MG PO TABS
100.0000 mg | ORAL_TABLET | Freq: Two times a day (BID) | ORAL | Status: DC
  Administered 2011-12-05 – 2011-12-07 (×5): 100 mg via ORAL
  Filled 2011-12-05 (×6): qty 1

## 2011-12-05 MED ORDER — OXYCODONE HCL 5 MG PO TABS
10.0000 mg | ORAL_TABLET | ORAL | Status: DC | PRN
  Administered 2011-12-05 – 2011-12-10 (×13): 10 mg via ORAL
  Administered 2011-12-11: 5 mg via ORAL
  Administered 2011-12-12: 10 mg via ORAL
  Filled 2011-12-05 (×11): qty 2
  Filled 2011-12-05: qty 1
  Filled 2011-12-05 (×3): qty 2

## 2011-12-05 MED ORDER — INSULIN ASPART 100 UNIT/ML ~~LOC~~ SOLN
4.0000 [IU] | Freq: Three times a day (TID) | SUBCUTANEOUS | Status: DC
  Administered 2011-12-05 – 2011-12-06 (×5): 4 [IU] via SUBCUTANEOUS

## 2011-12-05 MED ORDER — LACTULOSE 10 GM/15ML PO SOLN
30.0000 g | Freq: Two times a day (BID) | ORAL | Status: DC
  Administered 2011-12-05 – 2011-12-09 (×7): 30 g via ORAL
  Filled 2011-12-05 (×9): qty 45

## 2011-12-05 NOTE — Progress Notes (Signed)
I have reviewed the above note, examined the patient and agree with plan of treatment. Patient known to me with end-stage  liver disease, portal hypertension, encephalopathy, despite of normal ammonia level. He has responded to hydration. Plan to continue on Xifaxan, he is becoming a total care patient - plan rehab. placement  Temporarily at least  for now.

## 2011-12-05 NOTE — Progress Notes (Signed)
PATIENT DETAILS Name: [name] Age: 61 y.o. Sex: male Date of Birth: 25-Jan-1951 Admit Date: 12/03/2011 WUJ:WJXB,JYNWGNF L., DO Emergency contacts: Wife Eunice Blase [phone] & Daughter, Vernona Rieger [phone] (cell)  CONSULTS: 1.  [facility]  Interval History: JEFFRE ENRIQUES is an 61 y.o. 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 He has had multiple hospitalizations for similar complaints.  He was put on lactulose and Xifaxan on admission.  His UTI is empirically being treated with Rocephin.  His wife thinks he needs SNF placement for rehab, since she works 2 jobs and cannot provide him with the care she feels he needs.  ROS: Mr. Hart is tearful this morning.  He wants to be transferred to the 5th floor, where he knows the nursing staff better.  .  Denies dyspnea.  Feels he is too weak to go home and is inquiring about rehab.  Wants me to change his diet so he can have cream of potato soup.  Objective: Vital signs in last 24 hours: Temp:  [97.7 F (36.5 C)-98.1 F (36.7 C)] 97.7 F (36.5 C) (01/07 0545) Pulse Rate:  [81-100] 89  (01/07 0545) Resp:  [16-24] 16  (01/07 0545) BP: (95-111)/(67-72) 111/71 mmHg (01/07 0545) SpO2:  [97 %-98 %] 98 % (01/07 0545) Weight change:  Last BM Date: 12/05/11  Intake/Output from previous day:  Intake/Output Summary (Last 24 hours) at 12/05/11 1059 Last data filed at 12/05/11 0845  Gross per 24 hour  Intake   1330 ml  Output   1306 ml  Net     24 ml     Physical Exam:  Gen:  NAD Cardiovascular:  RRR, No M/R/G Respiratory: Lungs CTAB Gastrointestinal: Abdomen softly distended. NT with normal active bowel sounds. Extremities: 1+ edema     Lab Results: Basic Metabolic Panel:  Lab 12/05/11 2952 12/03/11 1153  NA 128* 123*  K 4.5 4.4  CL 95* 85*  CO2 24 27  GLUCOSE 230* 129*  BUN 44* 77*  CREATININE 1.11 1.63*  CALCIUM 9.3 10.0  MG -- --  PHOS -- --     GFR Estimated Creatinine Clearance: 86.1 ml/min (by C-G formula based on Cr of 1.11). Liver Function Tests:  Lab 12/03/11 1153  AST 44*  ALT 37  ALKPHOS 120*  BILITOT 2.1*  PROT 7.3  ALBUMIN 2.7*    Lab 12/03/11 1154  AMMONIA 47    CBC:  Lab 12/05/11 0405 12/03/11 1153  WBC 11.4* 12.1*  NEUTROABS -- 10.1*  HGB 11.0* 13.4  HCT 29.9* 36.3*  MCV 87.9 86.0  PLT 154 156   CBG:  Lab 12/05/11 0741 12/04/11 2126 12/04/11 1650 12/04/11 1150 12/04/11 0733  GLUCAP 190* 257* 206* 142* 108*   Microbiology Recent Results (from the past 240 hour(s))  URINE CULTURE     Status: Normal (Preliminary result)   Collection Time   12/03/11 12:29 PM      Component Value Range Status Comment   Specimen Description URINE, RANDOM   Final    Special Requests NONE   Final    Setup Time [id number]   Final    Colony Count >=100,000 COLONIES/ML   Final    Culture ESCHERICHIA COLI   Final    Report Status PENDING   Incomplete     Recent Results (from the past 240 hour(s))  URINE CULTURE     Status: Normal (Preliminary result)   Collection Time  12/03/11 12:29 PM      Component Value Range Status Comment   Specimen Description URINE, RANDOM   Final    Special Requests NONE   Final    Setup Time [id number]   Final    Colony Count >=100,000 COLONIES/ML   Final    Culture ESCHERICHIA COLI   Final    Report Status PENDING   Incomplete     Studies/Results: Ct Head Wo Contrast  12/03/2011  *RADIOLOGY REPORT*  Clinical Data: Altered mental status  CT HEAD WITHOUT CONTRAST  Technique:  Contiguous axial images were obtained from the base of the skull through the vertex without contrast.  Comparison: [place] CT head dated 08/28/2011  Findings: No evidence of parenchymal hemorrhage or extra-axial fluid collection. No mass lesion, mass effect, or midline shift.  No CT evidence of acute infarction.  Cerebral volume is age appropriate.  No ventriculomegaly.  The visualized paranasal sinuses are  essentially clear. The mastoid air cells are unopacified.  No evidence of calvarial fracture.  IMPRESSION: No evidence of acute intracranial abnormality.  Original Report Authenticated By: Charline Bills, M.D.    Medications: Scheduled Meds:    . aspirin  81 mg Oral Daily  . cefTRIAXone (ROCEPHIN)  IV  1 g Intravenous Q24H  . insulin aspart  0-5 Units Subcutaneous QHS  . insulin aspart  0-9 Units Subcutaneous TID WC  . insulin aspart  3 Units Subcutaneous TID WC  . insulin detemir  40 Units Subcutaneous QHS  . lactulose  30 g Oral TID  . levothyroxine  50 mcg Oral QAC breakfast  . metoprolol succinate  12.5 mg Oral Daily  . mulitivitamin with minerals  1 tablet Oral Daily  . pantoprazole  40 mg Oral Q1200  . rifaximin  550 mg Oral BID  . rOPINIRole  1 mg Oral QHS  . sodium chloride  3 mL Intravenous Q12H   Continuous Infusions:  PRN Meds:.sodium chloride, acetaminophen, acetaminophen, morphine, nitroGLYCERIN, ondansetron (ZOFRAN) IV, ondansetron, oxyCODONE, sodium chloride Antibiotics: Anti-infectives     Start     Dose/Rate Route Frequency Ordered Stop   12/04/11 1500   cefTRIAXone (ROCEPHIN) 1 g in dextrose 5 % 50 mL IVPB        1 g 100 mL/hr over 30 Minutes Intravenous Every 24 hours 12/03/11 1815     12/03/11 2200   rifaximin (XIFAXAN) tablet 550 mg        550 mg Oral 2 times daily 12/03/11 1745     12/03/11 1330   cefTRIAXone (ROCEPHIN) 1 g in dextrose 5 % 50 mL IVPB        1 g 100 mL/hr over 30 Minutes Intravenous  Once 12/03/11 1326 12/03/11 1446           Assessment/Plan:  Principal Problem:  *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  Requested GI consultation per wife's request. Active Problems:  AODM  Being maintained on Levemir 40 units, and 3 units of Novalog Q AC.  SSI, insulin sensitive scale, was added 12/03/11. CBG 108-257 over past 24 hours.  Will increase  Novalog meal coverage to 4 units Q AC. RLS (restless legs syndrome)  The patient is being maintained on Requip.  Essential hypertension, benign  The patient is being maintained on Metoprolol.  BP currently controlled.  Monitor BP and adjust meds if needed.  Morbid obesity  Change diet to low sodium, since he is largely non-adherent  to dietary intervention. Arthritis pain of hip  The patient is being maintained on pain meds as needed. D/C IV morphine. CRI (chronic renal insufficiency) / ARF  We held diuretics due to increased renal insufficiency. Renal function back to baseline.  Will resume diuretics. Anemia  Chronic, likely secondary to CKD.  Prophylaxis  SCDs  E. Coli UTI  F/U sensitivities. On empiric Rocephin.  Hypothyroidism The patient is being maintained on Synthroid.  Hyponatremia Likely secondary to cirrhosis.  Stable.  Failure to Thrive SW consulted for placement. PT/OT evaluations ordered.  I spoke with the patient's wife by telephone, updated her on the plan of care and answered all of her questions.  LOS: 2 days   Hillery Aldo, MD Pager [phone]  12/05/2011, 10:59 AM

## 2011-12-05 NOTE — Consult Note (Signed)
[facility] Consultation  Referring Provider: [facility] (Dr. Darnelle Catalan) Primary Care Physician:  [name]., DO Primary Gastroenterologist:   Wendall Papa, MD Reason for Consultation:  Hepatic Encephalopathy  HPI: [name] is a 61 y.o. male known to Dr. Christella Hartigan for history of NASH cirrhosis. Patient has had multiple hospitalizations for hepatic encephalopathy. He was last seen in the office 11/23/11 at which time Xifaxan was added to Lactulose for better control of hepatic encephalopathy. Patient admitted 12/02/10 with altered mental status. A head CTscan was negative. Patient states he was taking the Lactulose and the Xifaxan.  He is currently on Rocephin for E.Coli UTI.  Past Medical History  Diagnosis Date  . Hypertension   . Type II or unspecified type diabetes mellitus without mention of complication, not stated as uncontrolled   . Thyroid disease   . Cirrhosis, non-alcoholic   . Morbid obesity   . Pancytopenia   . Fatty liver   . Coronary artery disease   . Myocardial infarction     "HEART INCIDENT " OCT 2012  . Angina     RARE USE OF NTG  . CHF (congestive heart failure)     JUNE 2012  . Arthritis   . Gout 35 YRS AGO  . Cellulitis of left leg   . Cirrhosis of liver     NONALCOHOLIC  . Splenomegaly   . Platelets decreased   . Thyroid disorder     PT DOES NOT KNOW IF LOW OR HIGH  . Obstructive sleep apnea     USES O2 WITH C - PAP SETTING FOR C-PA IS 16  . Chronic kidney disease (CKD), stage III (moderate)     Past Surgical History  Procedure Date  . Coronary stent placement     2 CARDIAC STENTS OCT 08/2011  . Total knee arthroplasty 10/14/2011  . Total hip arthroplasty 10/14/2011    Procedure: TOTAL HIP ARTHROPLASTY ANTERIOR APPROACH;  Surgeon: Kathryne Hitch;  Location: [facility];  Service: Orthopedics;  Laterality: Right;  . Joint replacement   . Appendectomy 1968  . Tonsillectomy 1957  . Cardiac catheterization 09/08/2011    Prior  to Admission medications   Medication Sig Start Date End Date Taking? Authorizing Provider  aspirin 81 MG tablet Take 81 mg by mouth every morning.    Yes Historical Provider, MD  docusate sodium (COLACE) 100 MG capsule Take 100 mg by mouth 2 (two) times daily.     Yes Historical Provider, MD  furosemide (LASIX) 40 MG tablet Take by mouth 2 (two) times daily. Take 40mg   Tablet every morning and 80mg  every evening 90 day supply  11/10/11  Yes Meredith Pel, NP  insulin aspart (NOVOLOG) 100 UNIT/ML injection Inject 6 Units into the skin 3 (three) times daily with meals. 10/31/11 10/30/12 Yes Novlet Jarrett-Davis  insulin detemir (LEVEMIR) 100 UNIT/ML injection Inject 40 Units into the skin at bedtime. 10/31/11  Yes Novlet Jarrett-Davis  lactulose (CHRONULAC) 10 GM/15ML solution Take 30 g by mouth 3 (three) times daily.     Yes Historical Provider, MD  levothyroxine (SYNTHROID, LEVOTHROID) 50 MCG tablet Take 50 mcg by mouth every morning.    Yes Historical Provider, MD  lidocaine (LIDODERM) 5 % Place 1 patch onto the skin daily as needed. Remove & Discard patch within 12 hours or as directed by MD..PAIN   Yes Historical Provider, MD  methocarbamol (ROBAXIN) 500 MG tablet Take 500 mg by mouth every 12 (twelve) hours as needed.  Yes Historical Provider, MD  metoprolol succinate (TOPROL-XL) 25 MG 24 hr tablet 12.5 mg every morning.    Yes Historical Provider, MD  Multiple Vitamin (MULTIVITAMIN) tablet Take 1 tablet by mouth daily.     Yes Historical Provider, MD  nystatin (NYSTOP) 100000 UNIT/GM POWD Apply topically daily. topically  10/31/11  Yes Novlet Jarrett-Davis  oxycodone (OXY-IR) 5 MG capsule Take 5 mg by mouth every 4 (four) hours as needed. Patient states that he takes this medication 4 to 6 times a day    Yes Historical Provider, MD  pantoprazole (PROTONIX) 40 MG tablet Take 40 mg by mouth daily at 12 noon.   10/31/11 10/30/12 Yes Novlet Jarrett-Davis  rifaximin (XIFAXAN) 550 MG TABS Take 1  tablet (550 mg total) by mouth 2 (two) times daily. 11/23/11  Yes Rob Bunting, MD  rOPINIRole (REQUIP) 1 MG tablet Take 1 tablet (1 mg total) by mouth at bedtime. 10/31/11 10/30/12 Yes Novlet Jarrett-Davis  spironolactone (ALDACTONE) 50 MG tablet Take 100 mg by mouth 2 (two) times daily.   11/10/11  Yes Meredith Pel, NP  nitroGLYCERIN (NITROSTAT) 0.4 MG SL tablet Place 0.4 mg under the tongue every 5 (five) minutes as needed. CHEST PAIN    Historical Provider, MD    Current Facility-Administered Medications  Medication Dose Route Frequency Provider Last Rate Last Dose  . 0.9 %  sodium chloride infusion  250 mL Intravenous PRN Christina Rama      . acetaminophen (TYLENOL) tablet 650 mg  650 mg Oral Q6H PRN Christina Rama       Or  . acetaminophen (TYLENOL) suppository 650 mg  650 mg Rectal Q6H PRN Christina Rama      . aspirin chewable tablet 81 mg  81 mg Oral Daily Christina Rama   81 mg at 12/05/11 1012  . cefTRIAXone (ROCEPHIN) 1 g in dextrose 5 % 50 mL IVPB  1 g Intravenous Q24H Christina Rama   1 g at 12/04/11 1522  . furosemide (LASIX) tablet 40 mg  40 mg Oral BID Christina Rama      . insulin aspart (novoLOG) injection 0-5 Units  0-5 Units Subcutaneous QHS Christina Rama   3 Units at 12/04/11 2128  . insulin aspart (novoLOG) injection 0-9 Units  0-9 Units Subcutaneous TID WC Christina Rama   7 Units at 12/05/11 1157  . insulin aspart (novoLOG) injection 4 Units  4 Units Subcutaneous TID WC Christina Rama   4 Units at 12/05/11 1158  . insulin detemir (LEVEMIR) injection 40 Units  40 Units Subcutaneous QHS Christina Rama   40 Units at 12/04/11 2122  . lactulose (CHRONULAC) 10 GM/15ML solution 30 g  30 g Oral TID Gavin Pound. Ghim, MD   30 g at 12/04/11 2122  . levothyroxine (SYNTHROID, LEVOTHROID) tablet 50 mcg  50 mcg Oral QAC breakfast Christina Rama   50 mcg at 12/05/11 0818  . metoprolol succinate (TOPROL-XL) 24 hr tablet 12.5 mg  12.5 mg Oral Daily Christina Rama   12.5 mg at 12/05/11  1012  . mulitivitamin with minerals tablet 1 tablet  1 tablet Oral Daily Christina Rama   1 tablet at 12/05/11 1012  . nitroGLYCERIN (NITROSTAT) SL tablet 0.4 mg  0.4 mg Sublingual Q5 min PRN Christina Rama      . ondansetron (ZOFRAN) tablet 4 mg  4 mg Oral Q6H PRN Christina Rama       Or  . ondansetron (ZOFRAN) injection 4 mg  4 mg Intravenous Q6H PRN Christina Rama      .  oxyCODONE (Oxy IR/ROXICODONE) immediate release tablet 10 mg  10 mg Oral Q4H PRN Christina Rama   10 mg at 12/05/11 1113  . pantoprazole (PROTONIX) EC tablet 40 mg  40 mg Oral Q1200 Christina Rama   40 mg at 12/05/11 1011  . rifaximin (XIFAXAN) tablet 550 mg  550 mg Oral BID Christina Rama   550 mg at 12/05/11 1012  . rOPINIRole (REQUIP) tablet 1 mg  1 mg Oral QHS Christina Rama   1 mg at 12/04/11 2122  . sodium chloride 0.9 % injection 3 mL  3 mL Intravenous Q12H Christina Rama   3 mL at 12/05/11 1011  . sodium chloride 0.9 % injection 3 mL  3 mL Intravenous PRN Christina Rama      . spironolactone (ALDACTONE) tablet 100 mg  100 mg Oral BID Christina Rama      . DISCONTD: insulin aspart (novoLOG) injection 3 Units  3 Units Subcutaneous TID WC Christina Rama   3 Units at 12/04/11 1726  . DISCONTD: morphine 2 MG/ML injection 2 mg  2 mg Intravenous Q4H PRN Christina Rama      . DISCONTD: oxyCODONE (Oxy IR/ROXICODONE) immediate release tablet 5 mg  5 mg Oral Q4H PRN Christina Rama   5 mg at 12/05/11 0865    Allergies as of 12/03/2011 - Review Complete 12/03/2011  Allergen Reaction Noted  . Crestor (rosuvastatin calcium)  10/24/2011  . Statins Other (See Comments) 07/12/2011    Family History  Problem Relation Age of Onset  . Colon cancer Neg Hx   . Diabetes Mother   . Diabetes Father     History   Social History  . Marital Status: Married    Spouse Name: N/A    Number of Children: 1  . Years of Education: 14   Occupational History  . Optician, dispensing (retired)   . Masters degree in [place]    Social History Main  Topics  . Smoking status: Never Smoker   . Smokeless tobacco: Never Used  . Alcohol Use: No  . Drug Use: No  . Sexually Active: No   Other Topics Concern  . Not on file   Social History Narrative   Married, lives with wife in [place].  Ambulates with walker.    Review of Systems: Altered sleeping patterns, back pain PHYSICAL EXAM: Vital signs in last 24 hours: Temp:  [97.7 F (36.5 C)-98.1 F (36.7 C)] 97.7 F (36.5 C) (01/07 0545) Pulse Rate:  [81-100] 89  (01/07 0545) Resp:  [16-24] 16  (01/07 0545) BP: (95-111)/(67-72) 111/71 mmHg (01/07 0545) SpO2:  [97 %-98 %] 98 % (01/07 0545) Last BM Date: 12/05/11 General:  Obese, white male in NAD Head:  Normocephalic and atraumatic. Eyes:   No icterus.   Conjunctiva pink. Ears:  Normal auditory acuity. Neck:  Supple; no masses felt Lungs:  Respirations even and unlabored. Anterior and lateral lung fields CTA.  Heart:  Regular rate and rhythm Abdomen:  Soft, obese, not significantly distended, nontender. Normal bowel sounds.  Rectal:  Not performed.  Msk:  Symmetrical without gross deformities.  Extremities:  Without edema. Neurologic:  Alert and  Oriented, grossly normal neurologically. No asterixis Skin:  Intact without significant lesions or rashes. Cervical Nodes:  No significant cervical adenopathy. Psych:  Pleasant and cooperative. Normal affect.  LAB RESULTS:  Basename 12/05/11 0405 12/03/11 1153  WBC 11.4* 12.1*  HGB 11.0* 13.4  HCT 29.9* 36.3*  PLT 154 156   BMET  Basename 12/05/11 0405 12/03/11 1153  NA 128* 123*  K 4.5 4.4  CL 95* 85*  CO2 24 27  GLUCOSE 230* 129*  BUN 44* 77*  CREATININE 1.11 1.63*  CALCIUM 9.3 10.0   LFT  Basename 12/03/11 1153  PROT 7.3  ALBUMIN 2.7*  AST 44*  ALT 37  ALKPHOS 120*  BILITOT 2.1*  BILIDIR --  IBILI --   PT/INR No results found for this basename: LABPROT:2,INR:2 in the last 72 hours  STUDIES: Ct Head Wo Contrast  12/03/2011  *RADIOLOGY REPORT*   Clinical Data: Altered mental status  CT HEAD WITHOUT CONTRAST  Technique:  Contiguous axial images were obtained from the base of the skull through the vertex without contrast.  Comparison: [facility] CT head dated 08/28/2011  Findings: No evidence of parenchymal hemorrhage or extra-axial fluid collection. No mass lesion, mass effect, or midline shift.  No CT evidence of acute infarction.  Cerebral volume is age appropriate.  No ventriculomegaly.  The visualized paranasal sinuses are essentially clear. The mastoid air cells are unopacified.  No evidence of calvarial fracture.  IMPRESSION: No evidence of acute intracranial abnormality.  Original Report Authenticated By: Charline Bills, M.D.   PREVIOUS ENDOSCOPIES: EGD September 2012 for varices screening - gastritis seen. No varices.   IMPRESSION / PLAN: 1. 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 UTI may have contributed to decompensation this time. Patient is alert and oriented today. He requests Lactulose to be cut back as he had 4 BMs yesterday. Lactulose can be titrated to 2-3 loose stools a day. Patient needs to continue the Xifaxan. His peripheral edema has resolved and that is major improvement from when we saw him last month. Patient should continue for low salt diet, current diuretic dosages. .   2. Acute on chronic renal failure, improving. 3. Deconditioning. It sounds like patient may be going to a rehab after discharge.  4. Multiple medical problems, see PMH    Thanks   LOS: 2 days   Willette Cluster  12/05/2011, 12:09 PM

## 2011-12-05 NOTE — Progress Notes (Signed)
Pt is known to CSW from a previous admission. CSW spoke with the pt and completed psychosocial assessment (pls see shadow chart). Pt states he did go to rehab after his last admission for a few days at [facility]. Pt is interested in going to rehab once discharged. CSW provided the pt with a list of facilities and pt is agreeable to CSW faxing the pt out. Pt reports he would prefer going to a facility other than [facility]. Per the pts request CSW spoke with the pts wife on the phone and she was also agreeable to pt being faxed out for SNF. CSW notes that PT was entering the pts room, when CSW completed psychosocial assessment. CSW will continue to follow the pt and offer support.  Golden Pop 12/05/2011 3:31 PM [phone]

## 2011-12-05 NOTE — Progress Notes (Signed)
Physical Therapy Treatment Patient Details Name: [name] MRN: [id number] DOB: 02-24-1951 Today's Date: 12/05/2011 1515-1540 ev2 PT Assessment/Plan  PT - Assessment/Plan PT Frequency: Min 3X/week Recommendations for Other Services: OT consult Follow Up Recommendations: Skilled nursing facility Equipment Recommended: Defer to next venue PT Goals  Acute Rehab PT Goals PT Goal Formulation: With patient Time For Goal Achievement: 2 weeks Pt will go Supine/Side to Sit: with supervision;with rail PT Goal: Supine/Side to Sit - Progress: Not met Pt will go Sit to Supine/Side: with supervision;with rail PT Goal: Sit to Supine/Side - Progress: Not met Pt will go Sit to Stand: with supervision PT Goal: Sit to Stand - Progress: Not met Pt will go Stand to Sit: with supervision PT Goal: Stand to Sit - Progress: Not met Pt will Ambulate: 51 - 150 feet;with supervision;with rolling walker PT Goal: Ambulate - Progress: Not met  PT Treatment Precautions/Restrictions  Precautions Precautions: Fall Restrictions Weight Bearing Restrictions: No RLE Weight Bearing: Weight bearing as tolerated LLE Weight Bearing: Weight bearing as tolerated Mobility (including Balance) Bed Mobility Bed Mobility: Yes Supine to Sit: 3: Mod assist;HOB elevated (Comment degrees) Supine to Sit Details (indicate cue type and reason): heavy support on rails to sit up, allowed increased time but patient unable to without assist come completely upright with HOB at max height Sitting - Scoot to Edge of Bed: 4: Min assist;With rail (HOB max position upright) Transfers Transfers: Yes Sit to Stand: 1: +2 Total assist;From bed;From elevated surface Sit to Stand Details (indicate cue type and reason): pt moves slowly, pt=60% Stand to Sit: 4: Min assist;To chair/3-in-1;With armrests Stand to Sit Details: pt takes extra time Stand Pivot Transfers: 1: +2 Total assist Stand Pivot Transfer Details (indicate cue type and  reason): pt takes extra time for activity to pivot, pt=60% Ambulation/Gait Ambulation/Gait: No Assistive device: Rolling walker  Posture/Postural Control Posture/Postural Control: Postural limitations Postural Limitations: has difficulty flexing trunk forward Exercise    End of Session PT - End of Session Equipment Utilized During Treatment: Gait belt Activity Tolerance: Patient tolerated treatment well Patient left: in chair;with call bell in reach Nurse Communication: Mobility status for transfers General Behavior During Session: [facility] for tasks performed  Rada Hay 12/05/2011, 4:54 PM

## 2011-12-06 LAB — GLUCOSE, CAPILLARY: Glucose-Capillary: 258 mg/dL — ABNORMAL HIGH (ref 70–99)

## 2011-12-06 MED ORDER — INSULIN ASPART 100 UNIT/ML ~~LOC~~ SOLN
0.0000 [IU] | Freq: Every day | SUBCUTANEOUS | Status: DC
  Administered 2011-12-06: 3 [IU] via SUBCUTANEOUS
  Administered 2011-12-07: 2 [IU] via SUBCUTANEOUS
  Administered 2011-12-09: 3 [IU] via SUBCUTANEOUS
  Administered 2011-12-10 – 2011-12-11 (×2): 2 [IU] via SUBCUTANEOUS

## 2011-12-06 MED ORDER — CIPROFLOXACIN HCL 250 MG PO TABS
250.0000 mg | ORAL_TABLET | Freq: Two times a day (BID) | ORAL | Status: DC
  Administered 2011-12-06 – 2011-12-10 (×8): 250 mg via ORAL
  Filled 2011-12-06 (×11): qty 1

## 2011-12-06 MED ORDER — INSULIN ASPART 100 UNIT/ML ~~LOC~~ SOLN
4.0000 [IU] | Freq: Three times a day (TID) | SUBCUTANEOUS | Status: DC
  Administered 2011-12-07 – 2011-12-09 (×8): 4 [IU] via SUBCUTANEOUS

## 2011-12-06 MED ORDER — INSULIN ASPART 100 UNIT/ML ~~LOC~~ SOLN
0.0000 [IU] | Freq: Three times a day (TID) | SUBCUTANEOUS | Status: DC
  Administered 2011-12-07: 5 [IU] via SUBCUTANEOUS
  Administered 2011-12-07: 3 [IU] via SUBCUTANEOUS
  Administered 2011-12-07: 5 [IU] via SUBCUTANEOUS
  Administered 2011-12-08: 2 [IU] via SUBCUTANEOUS
  Administered 2011-12-08: 3 [IU] via SUBCUTANEOUS
  Administered 2011-12-08: 11 [IU] via SUBCUTANEOUS
  Administered 2011-12-09 – 2011-12-10 (×4): 5 [IU] via SUBCUTANEOUS
  Administered 2011-12-11: 8 [IU] via SUBCUTANEOUS
  Administered 2011-12-11: 5 [IU] via SUBCUTANEOUS
  Administered 2011-12-12 (×2): 3 [IU] via SUBCUTANEOUS
  Filled 2011-12-06: qty 3

## 2011-12-06 NOTE — Progress Notes (Signed)
CSW received a call from the pts wife that she had toured the facility and spoke with her husband and they would like [facility]. CSW spoke with the pt and he confirms. CSW has called Danielle at the facility to inform her that the pt has accepted the bed offer. CSW will continue to follow the pt and offer support.  Patrice Paradise, LCSWA 12/06/2011 4:11 PM [phone]

## 2011-12-06 NOTE — Progress Notes (Signed)
CSW spoke with the pt and the pts wife (via speakerphone) and presented bed offers. Pt and pts wife report that the would like to see about [facility]. Pts wife plans to go and tour the facility today. CSW has called the facility to let them know that the pt has accepted the bed offering, Danielle at [facility] is aware that the pts wife plans to come tour. CSW will continue to follow the pt and offer support.  Patrice Paradise, LCSWA 12/06/2011 10:10 AM [phone]

## 2011-12-06 NOTE — Progress Notes (Signed)
Occupational Therapy Evaluation Patient Details Name: [name] MRN: [id number] DOB: 12-11-1950 Today's Date: 12/06/2011 EV2 [id number] Problem List:  Patient Active Problem List  Diagnoses  . AODM  . OBSTRUCTIVE SLEEP APNEA  . Essential hypertension, benign  . Cirrhosis  . Coronary atherosclerosis of native coronary artery  . Hyperlipidemia  . Morbid obesity  . Arthritis pain of hip  . CRI (chronic renal insufficiency)  . NAFLD (nonalcoholic fatty liver disease)  . Anemia  . Hepatic encephalopathy  . Enterococcus UTI  . RLS (restless legs syndrome)  . Reflux  . Diabetes mellitus  . ARF (acute renal failure)  . E. coli UTI  . Hyponatremia  . Hypothyroidism  . FTT (failure to thrive) in adult    Past Medical History:  Past Medical History  Diagnosis Date  . Hypertension   . Type II or unspecified type diabetes mellitus without mention of complication, not stated as uncontrolled   . Thyroid disease   . Cirrhosis, non-alcoholic   . Morbid obesity   . Pancytopenia   . Fatty liver   . Coronary artery disease   . Myocardial infarction     "HEART INCIDENT " OCT 2012  . Angina     RARE USE OF NTG  . CHF (congestive heart failure)     JUNE 2012  . Arthritis   . Gout 35 YRS AGO  . Cellulitis of left leg   . Cirrhosis of liver     NONALCOHOLIC  . Nocturia   . Elevated BUN   . Elevated serum creatinine   . Serum ammonia increased   . Splenomegaly   . Platelets decreased   . Thyroid disorder     PT DOES NOT KNOW IF LOW OR HIGH  . Obstructive sleep apnea     USES O2 WITH C - PAP SETTING FOR C-PA IS 16  . Chronic kidney disease (CKD), stage III (moderate)    Past Surgical History:  Past Surgical History  Procedure Date  . Coronary stent placement     2 CARDIAC STENTS OCT 08/2011  . Total knee arthroplasty 10/14/2011  . Total hip arthroplasty 10/14/2011    Procedure: TOTAL HIP ARTHROPLASTY ANTERIOR APPROACH;  Surgeon: Kathryne Hitch;  Location: [facility];   Service: Orthopedics;  Laterality: Right;  . Joint replacement   . Appendectomy 1968  . Tonsillectomy 1957  . Cardiac catheterization 09/08/2011    OT Assessment/Plan/Recommendation OT Assessment Clinical Impression Statement: Pt would benefit from skilled OT to maximize I w/BADLs, improve standing activity tolerance in prep for d/c to next venue of care. OT Recommendation/Assessment: Patient will need skilled OT in the acute care venue OT Problem List: Decreased activity tolerance;Decreased safety awareness;Decreased knowledge of use of DME or AE Barriers to Discharge: Inaccessible home environment;Decreased caregiver support OT Therapy Diagnosis : Generalized weakness OT Plan OT Frequency: Min 1X/week OT Treatment/Interventions: Self-care/ADL training;DME and/or AE instruction;Therapeutic activities;Patient/family education OT Recommendation Follow Up Recommendations: Skilled nursing facility Equipment Recommended: Defer to next venue Individuals Consulted Consulted and Agree with Results and Recommendations: Patient OT Goals Acute Rehab OT Goals OT Goal Formulation: With patient ADL Goals Pt Will Perform Grooming: with supervision;Other (comment);Standing at sink (x 2 tasks to improve standing activity tolerance.) ADL Goal: Grooming - Progress: Not met Pt Will Transfer to Toilet: with supervision;with DME;Extra wide 3-in-1 ADL Goal: Toilet Transfer - Progress: Progressing toward goals Pt Will Perform Toileting - Clothing Manipulation: with min assist;Standing ADL Goal: Toileting - Clothing Manipulation - Progress: Not met  Pt Will Perform Toileting - Hygiene: with min assist;Sit to stand from 3-in-1/toilet ADL Goal: Toileting - Hygiene - Progress: Not met Additional ADL Goal #1: Pt will perform supine<>sit with supervision using rails in prep for seated ADL. ADL Goal: Additional Goal #1 - Progress: Not met  OT Evaluation Precautions/Restrictions  Precautions Precautions:  Fall Restrictions Weight Bearing Restrictions: No RLE Weight Bearing: Weight bearing as tolerated LLE Weight Bearing: Weight bearing as tolerated Prior Functioning Home Living Additional Comments: Bathroom layout questions not asked due to pt d/cing to [facility]. Prior Function Level of Independence: Independent with basic ADLs;Requires assistive device for independence;Independent with gait;Independent with transfers ADL ADL Grooming: Simulated;Set up Where Assessed - Grooming: Sitting, bed;Unsupported Upper Body Bathing: Simulated;Set up Where Assessed - Upper Body Bathing: Unsupported;Sitting, bed Lower Body Bathing: Simulated;Maximal assistance Where Assessed - Lower Body Bathing: Sit to stand from bed Upper Body Dressing: Simulated;Set up Where Assessed - Upper Body Dressing: Sitting, bed;Unsupported Lower Body Dressing: Simulated;Maximal assistance Where Assessed - Lower Body Dressing: Sit to stand from bed Toilet Transfer: Performed;Moderate assistance Toilet Transfer Details (indicate cue type and reason): constant VCs for hand placement, posture and technique. Toilet Transfer Method: Information systems manager Manipulation: Performed;Maximal assistance Where Assessed - Glass blower/designer Manipulation: Sit to stand from 3-in-1 or toilet Toileting - Hygiene: Simulated;Maximal assistance Where Assessed - Toileting Hygiene: Sit to stand from 3-in-1 or toilet Tub/Shower Transfer: Not assessed Tub/Shower Transfer Method: Not assessed Equipment Used: Rolling walker;Other (comment) (extra wide 3:1) ADL Comments: Pt fatigues very quickly. Max time and effort needed for all functional tasks. Vision/Perception  Vision - History Baseline Vision: Wears glasses only for reading Patient Visual Report: No change from baseline Vision - Assessment Vision Assessment: Vision not tested Cognition Cognition Arousal/Alertness:  Awake/alert Overall Cognitive Status: Appears within functional limits for tasks assessed Orientation Level: Oriented X4 Cognition - Other Comments: Pt occasionally slow to process. Sensation/Coordination   Extremity Assessment RUE Assessment RUE Assessment: Within Functional Limits LUE Assessment LUE Assessment: Within Functional Limits Mobility  Bed Mobility Bed Mobility: Yes Supine to Sit: 1: +2 Total assist;Patient percentage (comment);HOB elevated (Comment degrees);With rails Supine to Sit Details (indicate cue type and reason): constant VCs for hand placement, posture and technique. Pt had extreme difficulty flexing trunk to sit upright. Sitting - Scoot to Edge of Bed: 4: Min assist Transfers Transfers: Yes Sit to Stand: 3: Mod assist;From elevated surface;With upper extremity assist;From bed;From chair/3-in-1 Sit to Stand Details (indicate cue type and reason): constant VCs for hand placement, posture and technique. Stand to Sit: To chair/3-in-1;With upper extremity assist;Without upper extremity assist;With armrests Stand to Sit Details: constant VCs for hand placement, posture and technique. Exercises   End of Session OT - End of Session Equipment Utilized During Treatment: Gait belt;Other (comment) (RW, wide 3:1) Activity Tolerance: Patient limited by fatigue Patient left: in chair;with call bell in reach General Behavior During Session: [facility] for tasks performed Cognition: [facility] for tasks performed   Marrion Accomando [id number] [phone] 12/06/2011, 2:25 PM

## 2011-12-06 NOTE — Progress Notes (Signed)
[facility] Progress Note  SUBJECTIVE: no complaints, feels okay  OBJECTIVE:  Vital signs in last 24 hours: Temp:  [97.5 F (36.4 C)-98.5 F (36.9 C)] 97.5 F (36.4 C) (01/08 0659) Pulse Rate:  [78-91] 89  (01/08 0659) Resp:  [18] 18  (01/08 0659) BP: (99-113)/(64-73) 109/73 mmHg (01/08 0659) SpO2:  [94 %-97 %] 97 % (01/08 0659) Last BM Date: 12/05/11 General:    Pleasant, obese white male in NAD Heart:  Regular rate and rhythm Lungs: Respirations even and unlabored Abdomen:  Soft, nontender and nondistended. Normal bowel sounds. Extremities:  Without edema. Neurologic:  Alert and oriented,  grossly normal neurologically. Psych:  Cooperative. Normal mood and affect.   Lab Results:  Basename 12/05/11 0405 12/03/11 1153  WBC 11.4* 12.1*  HGB 11.0* 13.4  HCT 29.9* 36.3*  PLT 154 156   BMET  Basename 12/05/11 0405 12/03/11 1153  NA 128* 123*  K 4.5 4.4  CL 95* 85*  CO2 24 27  GLUCOSE 230* 129*  BUN 44* 77*  CREATININE 1.11 1.63*  CALCIUM 9.3 10.0   LFT  Basename 12/03/11 1153  PROT 7.3  ALBUMIN 2.7*  AST 44*  ALT 37  ALKPHOS 120*  BILITOT 2.1*  BILIDIR --  IBILI --    ASSESSMENT / PLAN:  1. 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 UTI may have contributed to decompensation this time. Patient is alert and oriented today. He requests Lactulose to be cut back as he had 4 BMs Sunday. Lactulose can be titrated to 2-3 loose stools a day. Patient needs to continue the Xifaxan. His peripheral edema has resolved and that is major improvement from when we saw him last month in the office. Patient should continue low salt diet, current diuretic dosages. His electrolytes/ renal function will need to be monitored closely after discharge and we can do that at our office or he can have done at rehab.  He already has a follow up appointment scheduled with Dr.Jacobs on 12/16/11 at 3:45pmg 2. Acute on chronic renal failure, improving.  3.  Deconditioning. It sounds like patient may be going to a rehab after discharge. Wife touring [place] today 4. Multiple medical problems, see PMH  .   LOS: 3 days   Willette Cluster  12/06/2011, 10:57 AM  Patient seen and I agree with the above documentation including the assessment and plan. He appears alert and oriented today.

## 2011-12-06 NOTE — Progress Notes (Addendum)
PATIENT DETAILS Name: [name] Age: 61 y.o. Sex: male Date of Birth: [birth year] Admit Date: 12/03/2011 ZOX:WRUE,[id number] L., DO Emergency contacts: Wife [name] [phone] & Daughter, [name] [phone] (cell)  CONSULTS: 1. Dr. Lina Sar, [facility]  Interval History: [name] is an 61 y.o. 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 He has had multiple hospitalizations for similar complaints.  He was put on lactulose and Xifaxan on admission.  His UTI is empirically being treated with Rocephin.  His wife thinks he needs SNF placement for rehab, since she works 2 jobs and cannot provide him with the care she feels he needs.  He has now been accepted at [facility] with the plan to d/c him to SNF tomorrow.  ROS: [name] feels well.  No BMs today but mentally sharp.  No current pain, but has used pain medicines intermittently for chronic back pain.  Objective: Vital signs in last 24 hours: Temp:  [97.5 F (36.4 C)-98.5 F (36.9 C)] 98 F (36.7 C) (01/08 1624) Pulse Rate:  [78-97] 97  (01/08 1624) Resp:  [18] 18  (01/08 1624) BP: (109-118)/(71-74) 118/74 mmHg (01/08 1624) SpO2:  [94 %-97 %] 95 % (01/08 1624) Weight change:  Last BM Date: 12/05/11  Intake/Output from previous day:  Intake/Output Summary (Last 24 hours) at 12/06/11 1658 Last data filed at 12/06/11 1121  Gross per 24 hour  Intake    483 ml  Output   1725 ml  Net  -1242 ml     Physical Exam:  Gen:  NAD Cardiovascular:  RRR, No M/R/G Respiratory: Lungs CTAB Gastrointestinal: Abdomen softly distended. NT with normal active bowel sounds. Extremities: 1+ edema     Lab Results: Basic Metabolic Panel:  Lab 12/05/11 8657 12/03/11 1153  NA 128* 123*  K 4.5 4.4  CL 95* 85*  CO2 24 27  GLUCOSE 230* 129*  BUN 44* 77*  CREATININE 1.11 1.63*  CALCIUM 9.3 10.0  MG -- --  PHOS -- --   GFR Estimated Creatinine  Clearance: 86.1 ml/min (by C-G formula based on Cr of 1.11). Liver Function Tests:  Lab 12/03/11 1153  AST 44*  ALT 37  ALKPHOS 120*  BILITOT 2.1*  PROT 7.3  ALBUMIN 2.7*    Lab 12/03/11 1154  AMMONIA 47    CBC:  Lab 12/05/11 0405 12/03/11 1153  WBC 11.4* 12.1*  NEUTROABS -- 10.1*  HGB 11.0* 13.4  HCT 29.9* 36.3*  MCV 87.9 86.0  PLT 154 156   CBG:  Lab 12/06/11 1635 12/06/11 1130 12/06/11 0730 12/05/11 2252 12/05/11 1658  GLUCAP 203* 226* 103* 182* 199*   Microbiology Recent Results (from the past 240 hour(s))  URINE CULTURE     Status: Normal   Collection Time   12/03/11 12:29 PM      Component Value Range Status Comment   Specimen Description URINE, RANDOM   Final    Special Requests NONE   Final    Setup Time [id number]   Final    Colony Count >=100,000 COLONIES/ML   Final    Culture ESCHERICHIA COLI   Final    Report Status 12/05/2011 FINAL   Final    Organism ID, Bacteria ESCHERICHIA COLI   Final     Recent Results (from the past 240 hour(s))  URINE CULTURE     Status: Normal   Collection Time   12/03/11 12:29 PM  Component Value Range Status Comment   Specimen Description URINE, RANDOM   Final    Special Requests NONE   Final    Setup Time [id number]   Final    Colony Count >=100,000 COLONIES/ML   Final    Culture ESCHERICHIA COLI   Final    Report Status 12/05/2011 FINAL   Final    Organism ID, Bacteria ESCHERICHIA COLI   Final     Studies/Results: No results found.  Medications: Scheduled Meds:    . aspirin  81 mg Oral Daily  . ciprofloxacin  250 mg Oral BID  . furosemide  40 mg Oral BID  . insulin aspart  0-5 Units Subcutaneous QHS  . insulin aspart  0-9 Units Subcutaneous TID WC  . insulin aspart  4 Units Subcutaneous TID WC  . insulin detemir  40 Units Subcutaneous QHS  . lactulose  30 g Oral BID  . levothyroxine  50 mcg Oral QAC breakfast  . metoprolol succinate  12.5 mg Oral Daily  . mulitivitamin with minerals  1 tablet  Oral Daily  . pantoprazole  40 mg Oral Q1200  . rifaximin  550 mg Oral BID  . rOPINIRole  1 mg Oral QHS  . spironolactone  100 mg Oral BID  . DISCONTD: cefTRIAXone (ROCEPHIN)  IV  1 g Intravenous Q24H  . DISCONTD: sodium chloride  3 mL Intravenous Q12H   Continuous Infusions:  PRN Meds:.acetaminophen, acetaminophen, nitroGLYCERIN, ondansetron (ZOFRAN) IV, ondansetron, oxyCODONE, DISCONTD: sodium chloride, DISCONTD: sodium chloride Antibiotics: Anti-infectives     Start     Dose/Rate Route Frequency Ordered Stop   12/06/11 2000   ciprofloxacin (CIPRO) tablet 250 mg        250 mg Oral 2 times daily 12/06/11 1611     12/04/11 1500   cefTRIAXone (ROCEPHIN) 1 g in dextrose 5 % 50 mL IVPB  Status:  Discontinued        1 g 100 mL/hr over 30 Minutes Intravenous Every 24 hours 12/03/11 1815 12/06/11 1611   12/03/11 2200   rifaximin (XIFAXAN) tablet 550 mg        550 mg Oral 2 times daily 12/03/11 1745     12/03/11 1330   cefTRIAXone (ROCEPHIN) 1 g in dextrose 5 % 50 mL IVPB        1 g 100 mL/hr over 30 Minutes Intravenous  Once 12/03/11 1326 12/03/11 1446           Assessment/Plan:  Principal Problem:  *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 Seen by [facility],  per wife's request. Plan is to titrate lactulose to 2-3 BMs daily, and to continue current doses of diuretics.  He needs to remain on a low salt diet. Active Problems:  AODM  Initially was being maintained on Levemir 40 units, and 3 units of Novalog Q AC.  SSI, insulin sensitive scale, was added 1/5/79m and meal coverage was increased to 4 units Q AC on 12/05/11. CBG 103-226 over past 24 hours.  Will change SSI to moderate scale, and continue current dose of meal coverage and Levemir. RLS (restless legs syndrome)  The patient is being maintained on Requip.  Essential hypertension, benign  The patient is being maintained on  Metoprolol.  BP currently controlled.  Monitor BP and adjust meds if needed.  Morbid obesity  Change diet to low sodium, since he is largely non-adherent to dietary intervention. Arthritis pain of hip  The patient is being maintained on pain meds as needed. D/C IV morphine. CRI (chronic renal insufficiency) / ARF  We held diuretics upon admission due to increased renal insufficiency. Renal function back to baseline on 12/05/11, so diuretics resumed.  Check creatinine in a.m. To ensure tolerance of current dose of diuretic. Anemia  Chronic, likely secondary to CKD.  Prophylaxis  SCDs  E. Coli UTI  F/U sensitivities. On empiric Rocephin (organism sensitive). Change to oral Cipro therapy in anticipation of discharge to SNF tomorrow.  Hypothyroidism The patient is being maintained on Synthroid.  Hyponatremia Likely secondary to cirrhosis.  Stable.  Failure to Thrive SW consulted for placement. The patient's wife has chosen a bed at [facility].  PT/OT evaluations were completed.  I spoke with the patient's wife at the bedside, updated her on the plan of care and answered all of her questions.  LOS: 3 days   Hillery Aldo, MD Pager [phone]  12/06/2011, 4:58 PM

## 2011-12-07 LAB — BASIC METABOLIC PANEL
BUN: 35 mg/dL — ABNORMAL HIGH (ref 6–23)
CO2: 28 mEq/L (ref 19–32)
Calcium: 8.8 mg/dL (ref 8.4–10.5)
Chloride: 92 mEq/L — ABNORMAL LOW (ref 96–112)
GFR calc non Af Amer: 65 mL/min — ABNORMAL LOW (ref >90)
Glucose, Bld: 189 mg/dL — ABNORMAL HIGH (ref 70–99)
Potassium: 5.6 mEq/L — ABNORMAL HIGH (ref 3.5–5.1)
Potassium: 6 mEq/L — ABNORMAL HIGH (ref 3.5–5.1)
Sodium: 125 mEq/L — ABNORMAL LOW (ref 135–145)

## 2011-12-07 LAB — GLUCOSE, CAPILLARY: Glucose-Capillary: 181 mg/dL — ABNORMAL HIGH (ref 70–99)

## 2011-12-07 MED ORDER — SPIRONOLACTONE 50 MG PO TABS
50.0000 mg | ORAL_TABLET | Freq: Two times a day (BID) | ORAL | Status: DC
  Filled 2011-12-07: qty 1

## 2011-12-07 MED ORDER — FUROSEMIDE 20 MG PO TABS
20.0000 mg | ORAL_TABLET | Freq: Every day | ORAL | Status: DC
  Filled 2011-12-07: qty 1

## 2011-12-07 MED ORDER — FUROSEMIDE 20 MG PO TABS
20.0000 mg | ORAL_TABLET | Freq: Two times a day (BID) | ORAL | Status: DC
  Filled 2011-12-07: qty 1

## 2011-12-07 MED ORDER — SODIUM POLYSTYRENE SULFONATE 15 GM/60ML PO SUSP
30.0000 g | Freq: Once | ORAL | Status: AC
  Administered 2011-12-07: 30 g via ORAL
  Filled 2011-12-07: qty 120

## 2011-12-07 NOTE — Progress Notes (Signed)
[name] has lost crown on a front tooth and was wondering if he could have the hospital dentist to cement it back on before he is discharged to a rehab/snf.  Attempted to call the dental office but was closed, note left for Dr. Betti Cruz relaying  the patient's request.  Patient aware I was unable to arrange anything this evening

## 2011-12-07 NOTE — Progress Notes (Signed)
Physical Therapy Treatment Patient Details Name: [name] MRN: [id number] DOB: [birth year] Today's Date: 12/07/2011 Time: 1478-2956  2G PT Assessment/Plan  PT - Assessment/Plan Comments on Treatment Session: Possible D/C to SNF today. Pt able to initiate ambulation this session-tolerated well.  PT Plan: Discharge plan remains appropriate Follow Up Recommendations: Skilled nursing facility Equipment Recommended: Defer to next venue PT Goals  Acute Rehab PT Goals PT Goal: Supine/Side to Sit - Progress: Progressing toward goal PT Goal: Sit to Supine/Side - Progress: Progressing toward goal PT Goal: Sit to Stand - Progress: Progressing toward goal PT Goal: Stand to Sit - Progress: Progressing toward goal PT Goal: Ambulate - Progress: Progressing toward goal  PT Treatment Precautions/Restrictions  Precautions Precautions: Fall Restrictions Weight Bearing Restrictions: No Mobility (including Balance) Bed Mobility Bed Mobility: Yes Supine to Sit: 1: +2 Total assist;HOB elevated (Comment degrees);With rails (Pt=50%) Supine to Sit Details (indicate cue type and reason): HOB 60 degrees. Heavy reliance on UEs to get to EOB. Assist for trunk to upright and LEs off bed. Increased time.  Sit to Supine: 1: +2 Total assist;HOB flat (Pt=25%) Sit to Supine - Details (indicate cue type and reason): Asssit for LEs onto bed and for control of trunk.  Transfers Transfers: Yes Sit to Stand: From elevated surface;3: Mod assist;From bed;With upper extremity assist Sit to Stand Details (indicate cue type and reason): Highly elevated surface. Vcs safety, technique, hand placement. Assist to rise, stabilize.  Stand to Sit: 4: Min assist;To bed;To elevated surface;With upper extremity assist Stand to Sit Details: VCs safety, technique, hand placement. Assist to control descent.  Ambulation/Gait Ambulation/Gait: Yes Ambulation/Gait Assistance: 4: Min assist Ambulation/Gait Assistance Details (indicate  cue type and reason): VCs safety, technique, posture. Assist to stabilize and negotiate with RW. Multiple standing rest breaks. Very slow gait speed. Ambulation Distance (Feet): 50 Feet Assistive device: Rolling walker Gait Pattern: Step-through pattern;Decreased step length - left;Decreased step length - right;Decreased stride length    Exercise    End of Session PT - End of Session Equipment Utilized During Treatment: Gait belt Activity Tolerance: Patient tolerated treatment well Patient left: in bed;with call bell in reach General Behavior During Session: [facility] for tasks performed Cognition: [facility] for tasks performed  Rebeca Alert [facility] 12/07/2011, 2:20 PM [phone]

## 2011-12-07 NOTE — Progress Notes (Addendum)
Subjective: Reported that he was feeling well.  Ready for discharge to skilled nursing facility for rehabilitation.  Objective: Vital signs in last 24 hours: Filed Vitals:   12/06/11 0659 12/06/11 1624 12/06/11 2144 12/07/11 0602  BP: 109/73 118/74 122/67 120/57  Pulse: 89 97 84 101  Temp: 97.5 F (36.4 C) 98 F (36.7 C) 98.5 F (36.9 C) 98.8 F (37.1 C)  TempSrc: Oral Oral Oral Oral  Resp: 18 18 18 18   Height:      Weight:      SpO2: 97% 95% 99% 97%   Weight change:   Intake/Output Summary (Last 24 hours) at 12/07/11 1242 Last data filed at 12/07/11 0839  Gross per 24 hour  Intake    240 ml  Output   1500 ml  Net  -1260 ml    Physical Exam: General: Awake, Oriented, No acute distress. HEENT: EOMI. Chipped tooth in the upper denture Neck: Supple CV: S1 and S2 Lungs: Clear to ascultation bilaterally Abdomen: Soft, Nontender, Nondistended, +bowel sounds. Ext: Good pulses. Trace edema.  Lab Results:  Basename 12/07/11 0525 12/05/11 0405  NA 125* 128*  K 5.6* 4.5  CL 92* 95*  CO2 28 24  GLUCOSE 136* 230*  BUN 32* 44*  CREATININE 1.27 1.11  CALCIUM 9.0 9.3  MG -- --  PHOS -- --   No results found for this basename: AST:2,ALT:2,ALKPHOS:2,BILITOT:2,PROT:2,ALBUMIN:2 in the last 72 hours No results found for this basename: LIPASE:2,AMYLASE:2 in the last 72 hours  Basename 12/05/11 0405  WBC 11.4*  NEUTROABS --  HGB 11.0*  HCT 29.9*  MCV 87.9  PLT 154   No results found for this basename: CKTOTAL:3,CKMB:3,CKMBINDEX:3,TROPONINI:3 in the last 72 hours No components found with this basename: POCBNP:3 No results found for this basename: DDIMER:2 in the last 72 hours No results found for this basename: HGBA1C:2 in the last 72 hours No results found for this basename: CHOL:2,HDL:2,LDLCALC:2,TRIG:2,CHOLHDL:2,LDLDIRECT:2 in the last 72 hours No results found for this basename: TSH,T4TOTAL,FREET3,T3FREE,THYROIDAB in the last 72 hours No results found for this  basename: VITAMINB12:2,FOLATE:2,FERRITIN:2,TIBC:2,IRON:2,RETICCTPCT:2 in the last 72 hours  Micro Results: Recent Results (from the past 240 hour(s))  URINE CULTURE     Status: Normal   Collection Time   12/03/11 12:29 PM      Component Value Range Status Comment   Specimen Description URINE, RANDOM   Final    Special Requests NONE   Final    Setup Time [id number]   Final    Colony Count >=100,000 COLONIES/ML   Final    Culture ESCHERICHIA COLI   Final    Report Status 12/05/2011 FINAL   Final    Organism ID, Bacteria ESCHERICHIA COLI   Final     Studies/Results: No results found.  Medications: I have reviewed the patient's current medications. Scheduled Meds:   . aspirin  81 mg Oral Daily  . ciprofloxacin  250 mg Oral BID  . furosemide  40 mg Oral BID  . insulin aspart  0-15 Units Subcutaneous TID WC  . insulin aspart  0-5 Units Subcutaneous QHS  . insulin aspart  4 Units Subcutaneous TID WC  . insulin detemir  40 Units Subcutaneous QHS  . lactulose  30 g Oral BID  . levothyroxine  50 mcg Oral QAC breakfast  . metoprolol succinate  12.5 mg Oral Daily  . mulitivitamin with minerals  1 tablet Oral Daily  . pantoprazole  40 mg Oral Q1200  . rifaximin  550 mg Oral BID  .  rOPINIRole  1 mg Oral QHS  . spironolactone  100 mg Oral BID  . DISCONTD: cefTRIAXone (ROCEPHIN)  IV  1 g Intravenous Q24H  . DISCONTD: insulin aspart  0-5 Units Subcutaneous QHS  . DISCONTD: insulin aspart  0-9 Units Subcutaneous TID WC  . DISCONTD: insulin aspart  4 Units Subcutaneous TID WC  . 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 Seen by [facility], per wife's  request. Plan is to titrate lactulose to 2-3 BMs daily, and to continue current doses of diuretics. He needs to remain on a low salt diet.   DM uncontrolled. Initially was being maintained on Levemir 40 units, and 3 units of Novalog Q AC. SSI, insulin sensitive scale, was added 1/5/62m and meal coverage was increased to 4 units Q AC on 12/05/11. CBG 103-226 over past 24 hours. Will change SSI to moderate scale, and continue current dose of meal coverage and Levemir. Further titration to be done as outpatient.  RLS (restless legs syndrome)  The patient is being maintained on Requip.   Essential hypertension, benign  The patient is being maintained on Metoprolol. BP currently controlled. Monitor BP and adjust meds if needed.   Morbid obesity  Change diet to low sodium, since he is largely non-adherent to dietary intervention.   Arthritis pain of hip  The patient is being maintained on pain meds as needed. D/C IV morphine.   CRI (chronic renal insufficiency) / ARF  We held diuretics upon admission due to increased renal insufficiency. Renal function back to baseline on 12/05/11, so diuretics resumed. Renal function stable.  Anemia  Chronic, likely secondary to CKD.   E. Coli UTI  Sensitive to cefazolin, ceftriaxone, ciprofloxacin, levofloxacin, nitrofurantoin.  Currently on oral Cipro therapy.  Antibiotics since 12/03/2011.  Hypothyroidism  The patient is being maintained on Synthroid.   Hyponatremia  Likely secondary to cirrhosis. Stable.  Fluid restrict the patient to 1 L.  Discontinue dose of furosemide.  Hyperkalemia Discontinue spironolactone.  Patient placed on telemetry.  Give the patient one dose of Kayexalate.  Recheck labs in the morning.  Failure to Thrive  SW consulted for placement. The patient's wife has chosen a bed at [facility]. PT/OT evaluations were completed.   LOS: 4 days  Johnanna Bakke A, MD 12/07/2011, 12:42 PM

## 2011-12-08 LAB — GLUCOSE, CAPILLARY
Glucose-Capillary: 143 mg/dL — ABNORMAL HIGH (ref 70–99)
Glucose-Capillary: 324 mg/dL — ABNORMAL HIGH (ref 70–99)

## 2011-12-08 LAB — BASIC METABOLIC PANEL
BUN: 36 mg/dL — ABNORMAL HIGH (ref 6–23)
Calcium: 8.5 mg/dL (ref 8.4–10.5)
GFR calc non Af Amer: 64 mL/min — ABNORMAL LOW (ref >90)
GFR calc non Af Amer: 68 mL/min — ABNORMAL LOW (ref >90)
Glucose, Bld: 172 mg/dL — ABNORMAL HIGH (ref 70–99)
Glucose, Bld: 186 mg/dL — ABNORMAL HIGH (ref 70–99)
Potassium: 5.5 mEq/L — ABNORMAL HIGH (ref 3.5–5.1)
Sodium: 123 mEq/L — ABNORMAL LOW (ref 135–145)

## 2011-12-08 MED ORDER — BISACODYL 10 MG RE SUPP: 10.0000 mg | Freq: Every day | RECTAL | Status: DC | PRN

## 2011-12-08 MED ORDER — POLYETHYLENE GLYCOL 3350 17 G PO PACK
17.0000 g | PACK | Freq: Every day | ORAL | Status: DC
  Administered 2011-12-08 – 2011-12-10 (×3): 17 g via ORAL
  Filled 2011-12-08 (×5): qty 1

## 2011-12-08 MED ORDER — MINERAL OIL RE ENEM
1.0000 | ENEMA | Freq: Once | RECTAL | Status: AC | PRN
  Administered 2011-12-08: 1 via RECTAL
  Filled 2011-12-08 (×2): qty 1

## 2011-12-08 NOTE — Progress Notes (Signed)
[facility] 12/07/10  5pm  I spoke over telephone to Hospitalist (Dr. Betti Cruz) who rounded on patient yesterday and today. Yesterday patient's potassium rose to 5.6 and then 6.0. His sodium fell from 128 to 121. His current dose of Lasix at that time was 40mg  po bid and Aldactone 100mg  bid. His evening diuretics were held, he was put on a fluid restriction and Kayexelate enema was given. This am sodium rose to 125 and potassium fell to 5.5. Diuretics still on hold, labs for recheck in am. When appropriate, diuretics will resumed at lower dose. I spoke over the telephone to patient and provided reassurance.  Willette Cluster, NP-C / Lina Sar, MD

## 2011-12-08 NOTE — Progress Notes (Addendum)
Inpatient Diabetes Program Recommendations  AACE/ADA: New Consensus Statement on Inpatient Glycemic Control (2009)  Target Ranges:  Prepandial:   less than 140 mg/dL      Peak postprandial:   less than 180 mg/dL (1-2 hours)      Critically ill patients:  140 - 180 mg/dL   Reason for Visit: Hyperglycemia before lunch and supper and at [facility]  Inpatient Diabetes Program Recommendations Insulin - Meal Coverage: Please increase to 6 units novolog meal coverag tidwc as at home. Diet: Please change to carb modified (includes diet provisions for heart healthy).  Note: if increase meal coverage to home dose of 6 units tidwc and alter diet to carb modified, should probably decrease correction to sensitive if moderate proves to be too high.  Thank you, Lenor Coffin, RN, CNS, Diabetes Coordinator [phone])

## 2011-12-08 NOTE — Progress Notes (Signed)
Subjective: No specific complaints except wanted to have his diet liberalized.  Objective: Vital signs in last 24 hours: Filed Vitals:   12/07/11 1351 12/07/11 2130 12/08/11 0610 12/08/11 1500  BP: 104/62 94/62 114/70 98/67  Pulse: 98 90 84 85  Temp: 98.9 F (37.2 C) 97.4 F (36.3 C) 98.1 F (36.7 C) 98 F (36.7 C)  TempSrc: Oral Oral Oral Oral  Resp: 18 20 16 18   Height:      Weight:      SpO2: 96% 97% 97% 98%   Weight change:   Intake/Output Summary (Last 24 hours) at 12/08/11 1726 Last data filed at 12/08/11 1501  Gross per 24 hour  Intake   1080 ml  Output   1150 ml  Net    -70 ml    Physical Exam: General: Awake, Oriented, No acute distress. HEENT: EOMI. Chipped tooth in the upper denture Neck: Supple CV: S1 and S2 Lungs: Clear to ascultation bilaterally Abdomen: Soft, Nontender, Nondistended, +bowel sounds. Ext: Good pulses. Trace edema.  Lab Results:  Basename 12/08/11 1427 12/08/11 0523  NA 123* 125*  K 5.0 5.5*  CL 91* 92*  CO2 27 27  GLUCOSE 186* 172*  BUN 32* 36*  CREATININE 1.13 1.20  CALCIUM 8.5 8.4  MG -- 2.1  PHOS -- --   No results found for this basename: AST:2,ALT:2,ALKPHOS:2,BILITOT:2,PROT:2,ALBUMIN:2 in the last 72 hours No results found for this basename: LIPASE:2,AMYLASE:2 in the last 72 hours No results found for this basename: WBC:2,NEUTROABS:2,HGB:2,HCT:2,MCV:2,PLT:2 in the last 72 hours No results found for this basename: CKTOTAL:3,CKMB:3,CKMBINDEX:3,TROPONINI:3 in the last 72 hours No components found with this basename: POCBNP:3 No results found for this basename: DDIMER:2 in the last 72 hours No results found for this basename: HGBA1C:2 in the last 72 hours No results found for this basename: CHOL:2,HDL:2,LDLCALC:2,TRIG:2,CHOLHDL:2,LDLDIRECT:2 in the last 72 hours No results found for this basename: TSH,T4TOTAL,FREET3,T3FREE,THYROIDAB in the last 72 hours No results found for this basename:  VITAMINB12:2,FOLATE:2,FERRITIN:2,TIBC:2,IRON:2,RETICCTPCT:2 in the last 72 hours  Micro Results: Recent Results (from the past 240 hour(s))  URINE CULTURE     Status: Normal   Collection Time   12/03/11 12:29 PM      Component Value Range Status Comment   Specimen Description URINE, RANDOM   Final    Special Requests NONE   Final    Setup Time [id number]   Final    Colony Count >=100,000 COLONIES/ML   Final    Culture ESCHERICHIA COLI   Final    Report Status 12/05/2011 FINAL   Final    Organism ID, Bacteria ESCHERICHIA COLI   Final     Studies/Results: No results found.  Medications: I have reviewed the patient's current medications. Scheduled Meds:    . aspirin  81 mg Oral Daily  . ciprofloxacin  250 mg Oral BID  . insulin aspart  0-15 Units Subcutaneous TID WC  . insulin aspart  0-5 Units Subcutaneous QHS  . insulin aspart  4 Units Subcutaneous TID WC  . insulin detemir  40 Units Subcutaneous QHS  . lactulose  30 g Oral BID  . levothyroxine  50 mcg Oral QAC breakfast  . metoprolol succinate  12.5 mg Oral Daily  . mulitivitamin with minerals  1 tablet Oral Daily  . pantoprazole  40 mg Oral Q1200  . polyethylene glycol  17 g Oral Daily  . rifaximin  550 mg Oral BID  . rOPINIRole  1 mg Oral QHS  . 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 Plan is to titrate lactulose to 2-3 BMs daily, and to continue current doses of diuretics. He needs to remain on a low salt diet.   DM uncontrolled. Continued the patient on Levemir 40 units, and 4 units of Novalog Q AC. SS. Further titration to be done as outpatient.  Hyponatremia  Likely secondary to cirrhosis, discontinued  diuretics. Stable.  Fluid restrict the patient to 1 L. Slightly improved?  Hyperkalemia Discontinue spironolactone.  Discontinue telemetry as hyperkalemia improved.  Gave the patient one dose of Kayexalate on 12/07/2011.  RLS (restless legs syndrome)  The patient is being maintained on Requip.   Essential hypertension, benign  Patient is being maintained on Metoprolol. BP currently controlled. Monitor BP and adjust meds if needed.   Morbid obesity  Continue low sodium diet.  Arthritis pain of hip  The patient is being maintained on pain meds as needed. D/C IV morphine.   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 Renal function back to baseline on 12/05/11, blood diuretics again held due to hyponatremia.    Anemia  Chronic, likely secondary to CKD.   E. Coli UTI  Sensitive to cefazolin, ceftriaxone, ciprofloxacin, levofloxacin, nitrofurantoin.  Currently on oral Cipro therapy.  Antibiotics since 12/03/2011.  Hypothyroidism  The patient is being maintained on Synthroid.   Failure to Thrive  SW consulted for placement. Bed at [facility].  Disposition Pending improvement in sodium.   LOS: 5 days  [name] A, MD 12/08/2011, 5:26 PM

## 2011-12-08 NOTE — Progress Notes (Signed)
CSW visited with pt who reports he was concerned at to whether or not he would still be able to go to [facility] when he discharges. CSW answered the pts questions and informed him that the facility would be contacted and updated on pts medical condition. CSW has called and left Danielle at the facility a voice messages that the pt most likely will not discharge today. CSW also spoke with the pts RN Kiristin and let her know CSW was contacting to the facility. CSW will continue to follow the pt and offer support.

## 2011-12-09 DIAGNOSIS — E875 Hyperkalemia: Secondary | ICD-10-CM | POA: Diagnosis present

## 2011-12-09 LAB — GLUCOSE, CAPILLARY
Glucose-Capillary: 97 mg/dL (ref 70–99)
Glucose-Capillary: 209 mg/dL — ABNORMAL HIGH (ref 70–99)
Glucose-Capillary: 251 mg/dL — ABNORMAL HIGH (ref 70–99)

## 2011-12-09 LAB — MAGNESIUM: Magnesium: 2 mg/dL (ref 1.5–2.5)

## 2011-12-09 LAB — BASIC METABOLIC PANEL
BUN: 31 mg/dL — ABNORMAL HIGH (ref 6–23)
CO2: 25 mEq/L (ref 19–32)
Calcium: 8.3 mg/dL — ABNORMAL LOW (ref 8.4–10.5)
Creatinine, Ser: 1.17 mg/dL (ref 0.50–1.35)
GFR calc Af Amer: 74 mL/min — ABNORMAL LOW (ref >90)
GFR calc non Af Amer: 64 mL/min — ABNORMAL LOW (ref >90)
Glucose, Bld: 113 mg/dL — ABNORMAL HIGH (ref 70–99)
Potassium: 5.9 mEq/L — ABNORMAL HIGH (ref 3.5–5.1)
Sodium: 126 mEq/L — ABNORMAL LOW (ref 135–145)

## 2011-12-09 MED ORDER — FUROSEMIDE 80 MG PO TABS
80.0000 mg | ORAL_TABLET | Freq: Two times a day (BID) | ORAL | Status: DC
  Administered 2011-12-09 – 2011-12-12 (×6): 80 mg via ORAL
  Filled 2011-12-09 (×7): qty 1

## 2011-12-09 MED ORDER — SODIUM POLYSTYRENE SULFONATE 15 GM/60ML PO SUSP
30.0000 g | Freq: Once | ORAL | Status: AC
  Administered 2011-12-09: 30 g via ORAL
  Filled 2011-12-09: qty 120

## 2011-12-09 MED ORDER — INSULIN ASPART 100 UNIT/ML ~~LOC~~ SOLN
5.0000 [IU] | Freq: Three times a day (TID) | SUBCUTANEOUS | Status: DC
  Administered 2011-12-09 – 2011-12-12 (×8): 5 [IU] via SUBCUTANEOUS

## 2011-12-09 MED ORDER — LACTULOSE 10 GM/15ML PO SOLN
30.0000 g | Freq: Three times a day (TID) | ORAL | Status: DC
  Administered 2011-12-09 – 2011-12-10 (×4): 30 g via ORAL
  Filled 2011-12-09 (×8): qty 45

## 2011-12-09 NOTE — Consult Note (Signed)
Reason for Consult:Hyponatremia, hyperkalemia Referring Physician: [name] is an 62 y.o. male.  HPI: 61 yr old male with over 20 yr DM, hx CAD with 2 stents, hx Gout, hx OSA, hx NASH with low ptlt, encephalopathy (recurrent), [place],  Now admitted with encephalopathy.  Has developed ^ k after Spironolactone, and has cont after stopping.  No hx low BP, syncope.  Hx severe constipation now.  Chronic edema and has decreased here in bed.  Hx nocturia x 4.  No CP , PND or orthopnea.  No hx stones but hx UTIs. ROS;  No HA visual disturb, or hearing, or sores in eyes or mouth No smoking, asthma, or hay fever Occ indigestion but no infectious Hep No rash Hx comp fx but no active arthritis.  No FH renal disease, but hx CHF     Past Medical History  Diagnosis Date  . Hypertension   . Type II or unspecified type diabetes mellitus without mention of complication, not stated as uncontrolled   . Thyroid disease   . Cirrhosis, non-alcoholic   . Morbid obesity   . Pancytopenia   . Fatty liver   . Coronary artery disease   . Myocardial infarction     "HEART INCIDENT " OCT 2012  . Angina     RARE USE OF NTG  . CHF (congestive heart failure)     JUNE 2012  . Arthritis   . Gout 35 YRS AGO  . Cellulitis of left leg   . Cirrhosis of liver     NONALCOHOLIC  . Nocturia   . Elevated BUN   . Elevated serum creatinine   . Serum ammonia increased   . Splenomegaly   . Platelets decreased   . Thyroid disorder     PT DOES NOT KNOW IF LOW OR HIGH  . Obstructive sleep apnea     USES O2 WITH C - PAP SETTING FOR C-PA IS 16  . Chronic kidney disease (CKD), stage III (moderate)     Past Surgical History  Procedure Date  . Coronary stent placement     2 CARDIAC STENTS OCT 08/2011  . Total knee arthroplasty 10/14/2011  . Total hip arthroplasty 10/14/2011    Procedure: TOTAL HIP ARTHROPLASTY ANTERIOR APPROACH;  Surgeon: Kathryne Hitch;  Location: [facility];  Service: Orthopedics;   Laterality: Right;  . Joint replacement   . Appendectomy 1968  . Tonsillectomy 1957  . Cardiac catheterization 09/08/2011    Family History  Problem Relation Age of Onset  . Colon cancer Neg Hx   . Diabetes Mother   . Diabetes Father     Social History:  reports that he has never smoked. He has never used smokeless tobacco. He reports that he does not drink alcohol or use illicit drugs.  Allergies:  Allergies  Allergen Reactions  . Crestor (Rosuvastatin Calcium)     unknown  . Statins Other (See Comments)    PT DOES NOT REMEMBER    Medications: I have reviewed the patient's current medications.     Results for orders placed during the hospital encounter of 12/03/11 (from the past 48 hour(s))  GLUCOSE, CAPILLARY     Status: Abnormal   Collection Time   12/07/11  4:55 PM      Component Value Range Comment   Glucose-Capillary 217 (*) 70 - 99 (mg/dL)   GLUCOSE, CAPILLARY     Status: Abnormal   Collection Time   12/07/11  9:30 PM  Component Value Range Comment   Glucose-Capillary 243 (*) 70 - 99 (mg/dL)    Comment 1 Notify RN     BASIC METABOLIC PANEL     Status: Abnormal   Collection Time   12/08/11  5:23 AM      Component Value Range Comment   Sodium 125 (*) 135 - 145 (mEq/[name])    Potassium 5.5 (*) 3.5 - 5.1 (mEq/[name])    Chloride 92 (*) 96 - 112 (mEq/[name])    CO2 27  19 - 32 (mEq/[name])    Glucose, Bld 172 (*) 70 - 99 (mg/dL)    BUN 36 (*) 6 - 23 (mg/dL)    Creatinine, Ser 1.61  0.50 - 1.35 (mg/dL)    Calcium 8.4  8.4 - 10.5 (mg/dL)    GFR calc non Af Amer 64 (*) >90 (mL/min)    GFR calc Af Amer 74 (*) >90 (mL/min)   MAGNESIUM     Status: Normal   Collection Time   12/08/11  5:23 AM      Component Value Range Comment   Magnesium 2.1  1.5 - 2.5 (mg/dL)   GLUCOSE, CAPILLARY     Status: Abnormal   Collection Time   12/08/11  7:47 AM      Component Value Range Comment   Glucose-Capillary 143 (*) 70 - 99 (mg/dL)    Comment 1 Notify RN     GLUCOSE, CAPILLARY     Status:  Abnormal   Collection Time   12/08/11 12:11 PM      Component Value Range Comment   Glucose-Capillary 324 (*) 70 - 99 (mg/dL)    Comment 1 Notify RN     BASIC METABOLIC PANEL     Status: Abnormal   Collection Time   12/08/11  2:27 PM      Component Value Range Comment   Sodium 123 (*) 135 - 145 (mEq/[name])    Potassium 5.0  3.5 - 5.1 (mEq/[name])    Chloride 91 (*) 96 - 112 (mEq/[name])    CO2 27  19 - 32 (mEq/[name])    Glucose, Bld 186 (*) 70 - 99 (mg/dL)    BUN 32 (*) 6 - 23 (mg/dL)    Creatinine, Ser 0.96  0.50 - 1.35 (mg/dL)    Calcium 8.5  8.4 - 10.5 (mg/dL)    GFR calc non Af Amer 68 (*) >90 (mL/min)    GFR calc Af Amer 79 (*) >90 (mL/min)   GLUCOSE, CAPILLARY     Status: Abnormal   Collection Time   12/08/11  5:02 PM      Component Value Range Comment   Glucose-Capillary 189 (*) 70 - 99 (mg/dL)    Comment 1 Notify RN     GLUCOSE, CAPILLARY     Status: Abnormal   Collection Time   12/08/11  9:59 PM      Component Value Range Comment   Glucose-Capillary 191 (*) 70 - 99 (mg/dL)    Comment 1 Documented in Chart      Comment 2 Notify RN     BASIC METABOLIC PANEL     Status: Abnormal   Collection Time   12/09/11  5:22 AM      Component Value Range Comment   Sodium 124 (*) 135 - 145 (mEq/[name])    Potassium 5.9 (*) 3.5 - 5.1 (mEq/[name])    Chloride 94 (*) 96 - 112 (mEq/[name])    CO2 25  19 - 32 (mEq/[name])    Glucose, Bld 113 (*)  70 - 99 (mg/dL)    BUN 31 (*) 6 - 23 (mg/dL)    Creatinine, Ser 7.82  0.50 - 1.35 (mg/dL)    Calcium 8.3 (*) 8.4 - 10.5 (mg/dL)    GFR calc non Af Amer 66 (*) >90 (mL/min)    GFR calc Af Amer 76 (*) >90 (mL/min)   MAGNESIUM     Status: Normal   Collection Time   12/09/11  5:22 AM      Component Value Range Comment   Magnesium 2.0  1.5 - 2.5 (mg/dL)   AMMONIA     Status: Abnormal   Collection Time   12/09/11  5:22 AM      Component Value Range Comment   Ammonia 62 (*) 11 - 60 (umol/[name])   GLUCOSE, CAPILLARY     Status: Normal   Collection Time   12/09/11  7:40 AM       Component Value Range Comment   Glucose-Capillary 97  70 - 99 (mg/dL)    Comment 1 Notify RN     GLUCOSE, CAPILLARY     Status: Abnormal   Collection Time   12/09/11 11:35 AM      Component Value Range Comment   Glucose-Capillary 229 (*) 70 - 99 (mg/dL)    Comment 1 Notify RN       No results found.  @ROS @ Blood pressure 92/64, pulse 84, temperature 98.1 F (36.7 C), temperature source Oral, resp. rate 20, height 5\' 9"  (1.753 m), weight 111.8 kg (246 lb 7.6 oz), SpO2 96.00%. @PHYSEXAMBYAGE2 @ Physical Examination: General appearance - alert, well appearing, and in no distress, crying and volatie emotions,Pale Mental status - alert, oriented to person, place, and time Eyes - pupils equal and reactive, extraocular eye movements intact, funduscopic exam normal, discs flat and sharp Mouth - mucous membranes moist, pharynx normal without lesions and dental hygiene good Neck - adenopathy noted PCL Lymphatics - posterior cervical nodes Chest - decreased air entry noted bilat Heart - normal rate, regular rhythm, normal S1, S2, no murmurs, rubs, clicks or gallops, S1 and S2 normal, systolic murmur GR 3/6 at 2nd left intercostal space Abdomen - hepatomegaly soft, liver down 6 cm pos bs  Extremities - pedal edema 2 plus and  +, presacral edema Skin - Pale , bruises  Assessment/Plan: 1 Hyperkalemia  Spironolactone and diet.  Will eval K excretion, cortisol.  ?Draw technique 2 Hyponatremia Liver disease, and xs free H20  Functional SIADH 3 DM fair control 4. [name] Hyponatremia is bad prognostic factor 5. Obesity   [name] 12/09/2011, 4:18 PM

## 2011-12-09 NOTE — Progress Notes (Signed)
Attempted to walk with patient.  Notified by telemetry that heart rate had increased to the 170's.  Notified MD who looked at telemetry monitoring.  MD stated that it was expected for patients heart rate to increase due to his deconditioning and that patient's heart rate being up to 150's to 160's for a short period of time was expected.  It is okay per MD for staff to work with patient on moving around and walking to increase his exercise tolerance.

## 2011-12-09 NOTE — Progress Notes (Signed)
[facility] Progress Note  SUBJECTIVE: feels okay but constipated.  OBJECTIVE:  Vital signs in last 24 hours: Temp:  [98 F (36.7 C)-99.4 F (37.4 C)] 98.4 F (36.9 C) (01/11 0537) Pulse Rate:  [82-85] 82  (01/11 0537) Resp:  [18] 18  (01/11 0537) BP: (98-118)/(67-75) 118/75 mmHg (01/11 0537) SpO2:  [96 %-99 %] 96 % (01/11 0537) Last BM Date: 12/05/11 General:    Pleasant white male in NAD Heart:  Regular rate and rhythm; no murmurs Lungs: Respirations even and unlabored, lungs CTA bilaterally Abdomen:  Soft, nontenderand nondistended. Normal bowel sounds. Extremities:  Without edema. Neurologic:  Alert and oriented,  grossly normal neurologically. Psych:  Cooperative. Normal mood and affect.  BMET  Basename 12/09/11 0522 12/08/11 1427 12/08/11 0523  NA 124* 123* 125*  K 5.9* 5.0 5.5*  CL 94* 91* 92*  CO2 25 27 27   GLUCOSE 113* 186* 172*  BUN 31* 32* 36*  CREATININE 1.17 1.13 1.20  CALCIUM 8.3* 8.5 8.4     ASSESSMENT / PLAN:  1. 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 His encephalopathy has resolved with treatment of UTI, Lactulose and Xifaxan. 2. Acute on chronic renal failure, improving. 3. Electrolyte imbalances. His sodium has improved with fluid restriction and discontinuation of diuretics. His potassium has risen again overnight from 5.0 to 5.9.  Last Aldactone was 48 hours ago. Patient unable to be transferred to rehab secondary to electrolyte disturbances. I spoke with hospitalist (Dr. Betti Cruz) and he plans to consult Renal.  4. Constipation. He is getting daily Miralax. Got a mineral oil enema last night without results. Will increase his Lactulose back to TID.     LOS: 6 days   Willette Cluster  12/09/2011, 10:07 AM

## 2011-12-09 NOTE — Progress Notes (Signed)
Physical Therapy Treatment Patient Details Name: [name] MRN: [id number] DOB: 05/10/1951 Today's Date: 12/09/2011 12:00 - 12:50 1 ta  2 gt PT Assessment/Plan  PT - Assessment/Plan Comments on Treatment Session: Pt plans to D/C to SNF PT Plan: Discharge plan remains appropriate Follow Up Recommendations: Skilled nursing facility Equipment Recommended: Defer to next venue PT Goals  Acute Rehab PT Goals PT Goal Formulation: With patient Pt will go Supine/Side to Sit: with supervision PT Goal: Supine/Side to Sit - Progress: Progressing toward goal Pt will go Sit to Supine/Side: with supervision PT Goal: Sit to Supine/Side - Progress: Progressing toward goal Pt will go Sit to Stand: with supervision Pt will go Stand to Sit: with supervision PT Goal: Stand to Sit - Progress: Progressing toward goal Pt will Ambulate: 51 - 150 feet;with supervision;with rolling walker PT Goal: Ambulate - Progress: Progressing toward goal Pt will Go Up / Down Stairs: 3-5 stairs;with supervision;with least restrictive assistive device PT Goal: Up/Down Stairs - Progress: Not met  PT Treatment Precautions/Restrictions  Precautions Precautions: Fall Restrictions Weight Bearing Restrictions: No RLE Weight Bearing: Weight bearing as tolerated LLE Weight Bearing: Weight bearing as tolerated Mobility (including Balance) Bed Mobility Bed Mobility: Yes Supine to Sit: 1: +2 Total assist Supine to Sit Details (indicate cue type and reason): total assist + 2 pt 50% very slowly per pt request "push on my back" increased, increased time Sit to Supine: 1: +2 Total assist Sit to Supine - Details (indicate cue type and reason): total assist + 2 pt <25% very quickly per pt request Transfers Transfers: Yes Sit to Stand: 3: Mod assist;From bed;From elevated surface Sit to Stand Details (indicate cue type and reason): very elevated bed, nearly standing. Stood pt then applied corset back brace Stand to Sit: 4: Min  assist;To elevated surface;To bed Stand to Sit Details: increased time Ambulation/Gait Ambulation/Gait: Yes Ambulation/Gait Assistance: 4: Min assist Ambulation/Gait Assistance Details (indicate cue type and reason): increased tiem, nearly 22 min to amb 61' with several rest breaks and stretching breaks Ambulation Distance (Feet): 55 Feet Assistive device: Rolling walker (baritric RW) Gait Pattern: Step-through pattern;Shuffle Stairs: No Corporate treasurer: No    Exercise    End of Session PT - End of Session Equipment Utilized During Treatment: Gait belt Activity Tolerance: Patient tolerated treatment well Patient left: in bed;with call bell in reach Nurse Communication: Mobility status for ambulation General Behavior During Session: [facility] for tasks performed Cognition: [facility] for tasks performed  Felecia Shelling  PTA [facility]  Acute  Rehab Pager     [phone]

## 2011-12-09 NOTE — Progress Notes (Signed)
Subjective: No specific complaints.  Was tachycardic today as he attempted to ambulate in his room.  Objective: Vital signs in last 24 hours: Filed Vitals:   12/08/11 1500 12/08/11 2200 12/09/11 0537 12/09/11 1422  BP: 98/67 112/74 118/75 92/64  Pulse: 85 84 82 84  Temp: 98 F (36.7 C) 99.4 F (37.4 C) 98.4 F (36.9 C) 98.1 F (36.7 C)  TempSrc: Oral Oral Oral Oral  Resp: 18 18 18 20   Height:      Weight:      SpO2: 98% 99% 96% 96%   Weight change:   Intake/Output Summary (Last 24 hours) at 12/09/11 1622 Last data filed at 12/09/11 1423  Gross per 24 hour  Intake    600 ml  Output    800 ml  Net   -200 ml    Physical Exam: General: Awake, Oriented, No acute distress. HEENT: EOMI. Chipped tooth in the upper denture Neck: Supple CV: S1 and S2 Lungs: Clear to ascultation bilaterally Abdomen: Soft, Nontender, Nondistended, +bowel sounds. Ext: Good pulses. Trace edema.  Lab Results:  Basename 12/09/11 0522 12/08/11 1427 12/08/11 0523  NA 124* 123* --  K 5.9* 5.0 --  CL 94* 91* --  CO2 25 27 --  GLUCOSE 113* 186* --  BUN 31* 32* --  CREATININE 1.17 1.13 --  CALCIUM 8.3* 8.5 --  MG 2.0 -- 2.1  PHOS -- -- --   No results found for this basename: AST:2,ALT:2,ALKPHOS:2,BILITOT:2,PROT:2,ALBUMIN:2 in the last 72 hours No results found for this basename: LIPASE:2,AMYLASE:2 in the last 72 hours No results found for this basename: WBC:2,NEUTROABS:2,HGB:2,HCT:2,MCV:2,PLT:2 in the last 72 hours No results found for this basename: CKTOTAL:3,CKMB:3,CKMBINDEX:3,TROPONINI:3 in the last 72 hours No components found with this basename: POCBNP:3 No results found for this basename: DDIMER:2 in the last 72 hours No results found for this basename: HGBA1C:2 in the last 72 hours No results found for this basename: CHOL:2,HDL:2,LDLCALC:2,TRIG:2,CHOLHDL:2,LDLDIRECT:2 in the last 72 hours No results found for this basename: TSH,T4TOTAL,FREET3,T3FREE,THYROIDAB in the last 72 hours No  results found for this basename: VITAMINB12:2,FOLATE:2,FERRITIN:2,TIBC:2,IRON:2,RETICCTPCT:2 in the last 72 hours  Micro Results: Recent Results (from the past 240 hour(s))  URINE CULTURE     Status: Normal   Collection Time   12/03/11 12:29 PM      Component Value Range Status Comment   Specimen Description URINE, RANDOM   Final    Special Requests NONE   Final    Setup Time [id number]   Final    Colony Count >=100,000 COLONIES/ML   Final    Culture ESCHERICHIA COLI   Final    Report Status 12/05/2011 FINAL   Final    Organism ID, Bacteria ESCHERICHIA COLI   Final     Studies/Results: No results found.  Medications: I have reviewed the patient's current medications. Scheduled Meds:    . aspirin  81 mg Oral Daily  . ciprofloxacin  250 mg Oral BID  . furosemide  80 mg Oral BID  . insulin aspart  0-15 Units Subcutaneous TID WC  . insulin aspart  0-5 Units Subcutaneous QHS  . insulin aspart  4 Units Subcutaneous TID WC  . insulin detemir  40 Units Subcutaneous QHS  . lactulose  30 g Oral TID  . levothyroxine  50 mcg Oral QAC breakfast  . metoprolol succinate  12.5 mg Oral Daily  . mulitivitamin with minerals  1 tablet Oral Daily  . pantoprazole  40 mg Oral Q1200  . polyethylene glycol  17 g  Oral Daily  . rifaximin  550 mg Oral BID  . rOPINIRole  1 mg Oral QHS  . sodium polystyrene  30 g Oral Once  . 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 Plan is to titrate lactulose to 2-3 BMs daily, diuretics held over the last 2 days due to hyponatremia and hyperkalemia. He needs to remain on a low salt diet.  Diuretics restarted as per renal.  DM uncontrolled. Continued the patient on  Levemir 40 units, and 5 units of Novalog Q AC. SS. Further titration to be done as outpatient.  Hyponatremia  Likely secondary to cirrhosis, discontinued diuretics. Stable.  Fluid restrict the patient to 1 L. Renal consulted for further help appreciate their input.  Hyperkalemia Discontinue spironolactone.  Gave the patient one dose of Kayexalate on 12/07/2011.  Give another dose of Kayexalate on 12/09/2011.  RLS (restless legs syndrome)  Continue Requip.   Essential hypertension, benign  Patient is being maintained on Metoprolol. BP currently controlled. Monitor BP and adjust meds if needed.   Morbid obesity  Continue low sodium diet.  Arthritis pain of hip  The patient is being maintained on pain meds as needed. D/C IV morphine.   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 Renal function back to baseline on 12/05/11, diuretics restarted as per renal.    Anemia  Chronic, likely secondary to CKD.   E. Coli UTI  Sensitive to cefazolin, ceftriaxone, ciprofloxacin, levofloxacin, nitrofurantoin.  Currently on oral Cipro therapy.  Antibiotics since 12/03/2011.  Hypothyroidism  The patient is being maintained on Synthroid.   Failure to Thrive  SW consulted for placement. Bed at [facility].  Disposition Pending improvement in sodium and potassium.   LOS: 6 days  Clarrissa Shimkus A, MD 12/09/2011, 4:22 PM

## 2011-12-09 NOTE — Progress Notes (Signed)
Per MD, Pt not med ready to d/c today.   CSW to continue to follow.  Providence Crosby, LCSWA Clinical Social Work [phone]

## 2011-12-10 LAB — BASIC METABOLIC PANEL
BUN: 31 mg/dL — ABNORMAL HIGH (ref 6–23)
Chloride: 91 mEq/L — ABNORMAL LOW (ref 96–112)
Creatinine, Ser: 1.11 mg/dL (ref 0.50–1.35)
GFR calc Af Amer: 81 mL/min — ABNORMAL LOW (ref >90)

## 2011-12-10 LAB — URINALYSIS, ROUTINE W REFLEX MICROSCOPIC
Bilirubin Urine: NEGATIVE
Leukocytes, UA: NEGATIVE
Nitrite: NEGATIVE
Specific Gravity, Urine: 1.012 (ref 1.005–1.030)
Urobilinogen, UA: 2 mg/dL — ABNORMAL HIGH (ref 0.0–1.0)

## 2011-12-10 LAB — GLUCOSE, CAPILLARY: Glucose-Capillary: 208 mg/dL — ABNORMAL HIGH (ref 70–99)

## 2011-12-10 LAB — OSMOLALITY: Osmolality: 285 mOsm/kg (ref 275–300)

## 2011-12-10 MED ORDER — LACTULOSE 10 GM/15ML PO SOLN
30.0000 g | ORAL | Status: DC
  Administered 2011-12-10 – 2011-12-11 (×5): 30 g via ORAL
  Filled 2011-12-10 (×4): qty 45

## 2011-12-10 NOTE — Progress Notes (Signed)
Subjective: Interval History: none.  Objective: Vital signs in last 24 hours: Temp:  [98.1 F (36.7 C)-99.2 F (37.3 C)] 99.2 F (37.3 C) (01/12 0540) Pulse Rate:  [84-91] 86  (01/12 0540) Resp:  [18-20] 19  (01/12 0540) BP: (92-139)/(64-73) 139/73 mmHg (01/12 0540) SpO2:  [96 %-99 %] 98 % (01/12 0540) Weight change:   Intake/Output from previous day: 01/11 0701 - 01/12 0700 In: 840 [P.O.:840] Out: 1400 [Urine:1400] Intake/Output this shift:    General appearance: alert, cooperative and pale Resp: diminished breath sounds bilaterally and rales bibasilar Cardio: S1, S2 normal and systolic murmur: holosystolic 2/6, blowing at apex GI: obese, pos bs, soft, liver down 5 cm Extremities: extremities normal, atraumatic, no cyanosis or edema bruises, 1 plus edema  Lab Results: No results found for this basename: WBC:2,HGB:2,HCT:2,PLT:2 in the last 72 hours BMET:  [facility] 12/09/11 1550 12/09/11 0522  NA 126* 124*  K 5.9* 5.9*  CL 94* 94*  CO2 25 25  GLUCOSE 214* 113*  BUN 34* 31*  CREATININE 1.20 1.17  CALCIUM 8.4 8.3*   No results found for this basename: PTH:2 in the last 72 hours Iron Studies: No results found for this basename: IRON,TIBC,TRANSFERRIN,FERRITIN in the last 72 hours  Studies/Results: No results found.  I have reviewed the patient's current medications.  Assessment/Plan: 1 Hyponatremia labs P. Diuresing, fluid restrict 2 Hyperkalemia labs P 3 cirrhosis  4 ? Uti labs P P F/u labs    LOS: 7 days   Mirriam Vadala L 12/10/2011,7:53 AM

## 2011-12-10 NOTE — Progress Notes (Signed)
.  Follow up metabolic abnormalities, K+ pending, was 5.9 yesterday. Appreciate Dr Deterding's note. Today pt  Has no complaints, no asterixis, he is alert and oriented. Continue Lactulose 30cc po tid, Xifaxan 550 mg po bid

## 2011-12-10 NOTE — Progress Notes (Signed)
Subjective: No specific complaints.    Objective: Vital signs in last 24 hours: Filed Vitals:   12/09/11 2150 12/10/11 0540 12/10/11 1119 12/10/11 1504  BP: 114/67 139/73 106/63 98/67  Pulse: 91 86 73 88  Temp: 99 F (37.2 C) 99.2 F (37.3 C)  98.1 F (36.7 C)  TempSrc: Oral Oral  Oral  Resp: 18 19  19   Height:      Weight:      SpO2: 99% 98%  100%   Weight change:   Intake/Output Summary (Last 24 hours) at 12/10/11 1636 Last data filed at 12/10/11 1500  Gross per 24 hour  Intake    720 ml  Output   1900 ml  Net  -1180 ml    Physical Exam: General: Awake, Oriented, No acute distress. HEENT: EOMI. Chipped tooth in the upper denture Neck: Supple CV: S1 and S2 Lungs: Clear to ascultation bilaterally Abdomen: Soft, Nontender, Nondistended, +bowel sounds. Ext: Good pulses. Trace edema.  Lab Results:  Basename 12/10/11 0808 12/09/11 1550 12/09/11 0522 12/08/11 0523  NA 125* 126* -- --  K 5.0 5.9* -- --  CL 91* 94* -- --  CO2 25 25 -- --  GLUCOSE 141* 214* -- --  BUN 31* 34* -- --  CREATININE 1.11 1.20 -- --  CALCIUM 8.3* 8.4 -- --  MG -- -- 2.0 2.1  PHOS -- -- -- --   No results found for this basename: AST:2,ALT:2,ALKPHOS:2,BILITOT:2,PROT:2,ALBUMIN:2 in the last 72 hours No results found for this basename: LIPASE:2,AMYLASE:2 in the last 72 hours No results found for this basename: WBC:2,NEUTROABS:2,HGB:2,HCT:2,MCV:2,PLT:2 in the last 72 hours No results found for this basename: CKTOTAL:3,CKMB:3,CKMBINDEX:3,TROPONINI:3 in the last 72 hours No components found with this basename: POCBNP:3 No results found for this basename: DDIMER:2 in the last 72 hours No results found for this basename: HGBA1C:2 in the last 72 hours No results found for this basename: CHOL:2,HDL:2,LDLCALC:2,TRIG:2,CHOLHDL:2,LDLDIRECT:2 in the last 72 hours No results found for this basename: TSH,T4TOTAL,FREET3,T3FREE,THYROIDAB in the last 72 hours No results found for this basename:  VITAMINB12:2,FOLATE:2,FERRITIN:2,TIBC:2,IRON:2,RETICCTPCT:2 in the last 72 hours  Micro Results: Recent Results (from the past 240 hour(s))  URINE CULTURE     Status: Normal   Collection Time   12/03/11 12:29 PM      Component Value Range Status Comment   Specimen Description URINE, RANDOM   Final    Special Requests NONE   Final    Setup Time [id number]   Final    Colony Count >=100,000 COLONIES/ML   Final    Culture ESCHERICHIA COLI   Final    Report Status 12/05/2011 FINAL   Final    Organism ID, Bacteria ESCHERICHIA COLI   Final     Studies/Results: No results found.  Medications: I have reviewed the patient's current medications. Scheduled Meds:    . aspirin  81 mg Oral Daily  . ciprofloxacin  250 mg Oral BID  . furosemide  80 mg Oral BID  . insulin aspart  0-15 Units Subcutaneous TID WC  . insulin aspart  0-5 Units Subcutaneous QHS  . insulin aspart  5 Units Subcutaneous TID WC  . insulin detemir  40 Units Subcutaneous QHS  . lactulose  30 g Oral See admin instructions  . levothyroxine  50 mcg Oral QAC breakfast  . metoprolol succinate  12.5 mg Oral Daily  . mulitivitamin with minerals  1 tablet Oral Daily  . pantoprazole  40 mg Oral Q1200  . polyethylene glycol  17 g Oral  Daily  . rifaximin  550 mg Oral BID  . rOPINIRole  1 mg Oral QHS  . 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 Plan is to titrate lactulose to 2-3 BMs daily, diuretics restarted as per renal.   DM uncontrolled. Continued the patient on Levemir 40 units, and 5 units of Novalog Q AC. SS. Further titration to be done as outpatient.  Hyponatremia  Likely secondary to cirrhosis, discontinued diuretics. Stable.   Fluid restrict the patient to 1 L. Renal input appreciated.  Patient received a dose of furosemide yesterday before urine studies could be sent.  As the patient received furosemide urine studies are low yield.  Hyperkalemia Discontinue spironolactone.  Gave the patient one dose of Kayexalate on 12/07/2011.  Give another dose of Kayexalate on 12/09/2011.  RLS (restless legs syndrome)  Continue Requip.   Essential hypertension, benign  Patient is being maintained on Metoprolol. BP currently controlled. Monitor BP and adjust meds if needed.   Morbid obesity  Continue low sodium diet.  Arthritis pain of hip  The patient is being maintained on pain meds as needed. D/C IV morphine.   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 Renal function back to baseline on 12/05/11, diuretics restarted as per renal.    Anemia  Chronic, likely secondary to CKD.   E. Coli UTI  Sensitive to cefazolin, ceftriaxone, ciprofloxacin, levofloxacin, nitrofurantoin.  Currently on oral Cipro therapy.  Antibiotics since 12/03/2011.  Discontinue ciprofloxacin today, 12/10/2011 to complete a seven-day course.  Hypothyroidism  The patient is being maintained on Synthroid.   Failure to Thrive  SW consulted for placement. Bed at [facility] available.  Disposition Pending improvement in sodium and potassium.  Consider discharge on 12/12/2011 at sodium and potassium continues to improve.   LOS: 7 days  Kathrynne Kulinski A, MD 12/10/2011, 4:36 PM

## 2011-12-11 LAB — COMPREHENSIVE METABOLIC PANEL
ALT: 21 U/L (ref 0–53)
AST: 26 U/L (ref 0–37)
CO2: 28 mEq/L (ref 19–32)
Chloride: 92 mEq/L — ABNORMAL LOW (ref 96–112)
GFR calc non Af Amer: 64 mL/min — ABNORMAL LOW (ref >90)
Sodium: 125 mEq/L — ABNORMAL LOW (ref 135–145)
Total Bilirubin: 1.6 mg/dL — ABNORMAL HIGH (ref 0.3–1.2)

## 2011-12-11 LAB — CBC
Hemoglobin: 11.1 g/dL — ABNORMAL LOW (ref 13.0–17.0)
MCH: 30.9 pg (ref 26.0–34.0)
RBC: 3.59 MIL/uL — ABNORMAL LOW (ref 4.22–5.81)

## 2011-12-11 LAB — DIFFERENTIAL
Basophils Absolute: 0 10*3/uL (ref 0.0–0.1)
Basophils Relative: 0 % (ref 0–1)
Eosinophils Relative: 1 % (ref 0–5)
Monocytes Absolute: 0.8 10*3/uL (ref 0.1–1.0)

## 2011-12-11 LAB — PHOSPHORUS: Phosphorus: 3.5 mg/dL (ref 2.3–4.6)

## 2011-12-11 LAB — GLUCOSE, CAPILLARY: Glucose-Capillary: 227 mg/dL — ABNORMAL HIGH (ref 70–99)

## 2011-12-11 MED ORDER — INSULIN DETEMIR 100 UNIT/ML ~~LOC~~ SOLN
44.0000 [IU] | Freq: Every day | SUBCUTANEOUS | Status: DC
  Administered 2011-12-11: 44 [IU] via SUBCUTANEOUS

## 2011-12-11 NOTE — Progress Notes (Signed)
Subjective: Patient has no significant complaints. Patient will go to the previous admissions. Objective: Filed Vitals:   12/10/11 1119 12/10/11 1504 12/10/11 2126 12/11/11 0530  BP: 106/63 98/67 108/68 115/65  Pulse: 73 88 85 85  Temp:  98.1 F (36.7 C) 98.7 F (37.1 C) 98.2 F (36.8 C)  TempSrc:  Oral Oral Oral  Resp:  19 20 18   Height:      Weight:      SpO2:  100% 98% 100%   Weight change:   Intake/Output Summary (Last 24 hours) at 12/11/11 1437 Last data filed at 12/11/11 1100  Gross per 24 hour  Intake    480 ml  Output   1950 ml  Net  -1470 ml    General: Alert, awake, oriented x3, in no acute distress.  HEENT: [place]/AT [place]ER[place], EOMI Neck: Trachea midline,  no masses, no thyromegal,y no JVD, no carotid bruit OROPHARYNX:  Moist, No exudate/ erythema/lesions.  Heart: Regular rate and rhythm, without murmurs, rubs, gallops, PMI non-displaced, no heaves or thrills on palpation.  Lungs: Clear to auscultation, no wheezing or rhonchi noted. No increased vocal fremitus resonant to percussion  Abdomen: Soft, nontender, nondistended, positive bowel sounds, no masses no hepatosplenomegaly noted..  Neuro: No focal neurological deficits noted cranial nerves II through XII grossly intact. DTRs 2+ bilaterally upper and lower extremities. Strength 5 out of 5 in bilateral upper and lower extremities. Musculoskeletal: No warm swelling or erythema around joints, no spinal tenderness noted. Psychiatric: Patient alert and oriented x3, good insight and cognition, good recent to remote recall. Lymph node survey: No cervical axillary or inguinal lymphadenopathy noted.     Lab Results:  Basename 12/11/11 0555 12/10/11 0808 12/09/11 0522  NA 125* 125* --  K 5.3* 5.0 --  CL 92* 91* --  CO2 28 25 --  GLUCOSE 128* 141* --  BUN 32* 31* --  CREATININE 1.20 1.11 --  CALCIUM 8.6 8.3* --  MG -- -- 2.0  PHOS 3.5 -- --    Basename 12/11/11 0555  AST 26  ALT 21  ALKPHOS 141*  BILITOT 1.6*    PROT 6.9  ALBUMIN 2.0*   No results found for this basename: LIPASE:2,AMYLASE:2 in the last 72 hours  Basename 12/11/11 0555  WBC 9.7  NEUTROABS 7.6  HGB 11.1*  HCT 31.5*  MCV 87.7  PLT 185   No results found for this basename: CKTOTAL:3,CKMB:3,CKMBINDEX:3,TROPONINI:3 in the last 72 hours No components found with this basename: POCBNP:3 No results found for this basename: DDIMER:2 in the last 72 hours No results found for this basename: HGBA1C:2 in the last 72 hours No results found for this basename: CHOL:2,HDL:2,LDLCALC:2,TRIG:2,CHOLHDL:2,LDLDIRECT:2 in the last 72 hours No results found for this basename: TSH,T4TOTAL,FREET3,T3FREE,THYROIDAB in the last 72 hours No results found for this basename: VITAMINB12:2,FOLATE:2,FERRITIN:2,TIBC:2,IRON:2,RETICCTPCT:2 in the last 72 hours  Micro Results: Recent Results (from the past 240 hour(s))  URINE CULTURE     Status: Normal   Collection Time   12/03/11 12:29 PM      Component Value Range Status Comment   Specimen Description URINE, RANDOM   Final    Special Requests NONE   Final    Setup Time [id number]   Final    Colony Count >=100,000 COLONIES/ML   Final    Culture ESCHERICHIA COLI   Final    Report Status 12/05/2011 FINAL   Final    Organism ID, Bacteria ESCHERICHIA COLI   Final     Studies/Results: Dg Lumbar Spine 2-3  Views  11/20/2011  *RADIOLOGY REPORT*  Clinical Data: Low back pain for 1 week.  LUMBAR SPINE - 2-3 VIEW  Comparison: CT abdomen and pelvis 05/28/2011 reviewed.  Findings: The patient has a superior endplate compression fracture of L5 which is new since the comparison examination.  Vertebral body height loss is estimated at 50%.  Vertebral body height is otherwise maintained.  Alignment is normal. Facet degenerative disease in the lower lumbar spine is noted.  Right total hip replacement is noted.  IMPRESSION: L5 superior endplate compression fracture is new since exam 05/28/2011 although its exact age cannot be  determined. Per CMS PQRS reporting requirements (PQRS Measure 24): Given the patient's age of greater than 50 and the fracture site (hip, distal radius, or spine), the patient should be tested for osteoporosis using DXA, and the appropriate treatment considered based on the DXA results.  Original Report Authenticated By: Bernadene Bell. Maricela Curet, M.D.   Ct Head Wo Contrast  12/03/2011  *RADIOLOGY REPORT*  Clinical Data: Altered mental status  CT HEAD WITHOUT CONTRAST  Technique:  Contiguous axial images were obtained from the base of the skull through the vertex without contrast.  Comparison: [place] CT head dated 08/28/2011  Findings: No evidence of parenchymal hemorrhage or extra-axial fluid collection. No mass lesion, mass effect, or midline shift.  No CT evidence of acute infarction.  Cerebral volume is age appropriate.  No ventriculomegaly.  The visualized paranasal sinuses are essentially clear. The mastoid air cells are unopacified.  No evidence of calvarial fracture.  IMPRESSION: No evidence of acute intracranial abnormality.  Original Report Authenticated By: Charline Bills, M.D.    Medications: I have reviewed the patient's current medications. Scheduled Meds:   . aspirin  81 mg Oral Daily  . furosemide  80 mg Oral BID  . insulin aspart  0-15 Units Subcutaneous TID WC  . insulin aspart  0-5 Units Subcutaneous QHS  . insulin aspart  5 Units Subcutaneous TID WC  . insulin detemir  40 Units Subcutaneous QHS  . lactulose  30 g Oral See admin instructions  . levothyroxine  50 mcg Oral QAC breakfast  . metoprolol succinate  12.5 mg Oral Daily  . mulitivitamin with minerals  1 tablet Oral Daily  . pantoprazole  40 mg Oral Q1200  . polyethylene glycol  17 g Oral Daily  . rifaximin  550 mg Oral BID  . rOPINIRole  1 mg Oral QHS  . DISCONTD: ciprofloxacin  250 mg Oral BID  . DISCONTD: lactulose  30 g Oral TID   Continuous Infusions:  PRN Meds:.acetaminophen, acetaminophen, bisacodyl,  nitroGLYCERIN, ondansetron (ZOFRAN) IV, ondansetron, oxyCODONE Assessment/Plan: Patient Active Hospital Problem List: Hepatic encephalopathy (10/24/2011)   Assessment: resolved   Plan:  Continue lactulose and Xifaxan and titrate Lactulose to 2-3 BM's per day.   DM (11/26/2007)   Assessment: Blood sugars still elevated to 250's. Will increase levimer to 44 units.    Essential hypertension, benign (11/26/2007)   Assessment: BP controlled    Cirrhosis (08/22/2011)   Assessment: Per GI.  CRI (chronic renal insufficiency) (10/14/2011)   Assessment: Renal function at baseline.diuretics were started per neurology    NAFLD (nonalcoholic fatty liver disease) (10/14/2011)   Assessment: Defer to GI    Anemia (10/14/2011)   Assessment: Hemoglobin stable    E. coli UTI (12/03/2011)   Assessment: Fully treated     Hyponatremia (12/05/2011)   Assessment: Patient has a relatively subacute hyponatremia. On his admission his sodium was 137 the etiology is  unclear I will defer to nephrology to give recommendations on this. However clinically the patient appears to be stable affect above sodium of 125. We'll check a TSH     Hypothyroidism (12/05/2011)   Assessment: Patient is supplemented with Synthroid. We'll check TSH     anticipate discharge to skilled nursing facility dependent upon nephrology's advice regarding sodium of 125.   LOS: 8 days

## 2011-12-11 NOTE — Progress Notes (Signed)
Subjective: Interval History: none.  Objective: Vital signs in last 24 hours: Temp:  [98.1 F (36.7 C)-98.7 F (37.1 C)] 98.2 F (36.8 C) (01/13 0530) Pulse Rate:  [73-88] 85  (01/13 0530) Resp:  [18-20] 18  (01/13 0530) BP: (98-115)/(63-68) 115/65 mmHg (01/13 0530) SpO2:  [98 %-100 %] 100 % (01/13 0530) Weight change:   Intake/Output from previous day: 01/12 0701 - 01/13 0700 In: 480 [P.O.:480] Out: 1650 [Urine:1650] Intake/Output this shift:    General appearance: alert and cooperative Resp: diminished breath sounds bilaterally Cardio: S1, S2 normal and systolic murmur: holosystolic 2/6, blowing at apex GI: obese pos bs, soft, liver down 4 cm Extremities: extremities normal, atraumatic, no cyanosis or edema  Lab Results:  [facility] 12/11/11 0555  WBC 9.7  HGB 11.1*  HCT 31.5*  PLT 185   BMET:  Basename 12/11/11 0555 12/10/11 0808  NA 125* 125*  K 5.3* 5.0  CL 92* 91*  CO2 28 25  GLUCOSE 128* 141*  BUN 32* 31*  CREATININE 1.20 1.11  CALCIUM 8.6 8.3*   No results found for this basename: PTH:2 in the last 72 hours Iron Studies: No results found for this basename: IRON,TIBC,TRANSFERRIN,FERRITIN in the last 72 hours  Studies/Results: No results found.  I have reviewed the patient's current medications.  Assessment/Plan: 1 Hyponatremia stable.  Discussed Sna vs Na intake.  Cont Lasix 2 anemia 3 Hyperkalemia   Stable. Follow Labs to investigate problems never done as ordered and is too late to do now! P Lasix, f/u chem    LOS: 8 days   Neosha Switalski L 12/11/2011,8:09 AM

## 2011-12-11 NOTE — Progress Notes (Signed)
Nutrition Brief Note  Patient with questions regarding diet restrictions. He reports that he is tiring of the same diet options and would like assistance choosing foods that are allowed on the diet.   I briefly educated patient on the diet restrictions, and I provided him with a list of foods from the menu that are allowed on his diet.   Consider removing 'Heart Healthy' restriction from diet and changing diet to '2 gram Sodium' only if medically appropriate to increase menu choices.   Linnell Fulling, RD, LDN

## 2011-12-12 ENCOUNTER — Other Ambulatory Visit: Payer: Self-pay

## 2011-12-12 LAB — COMPREHENSIVE METABOLIC PANEL
ALT: 19 U/L (ref 0–53)
AST: 22 U/L (ref 0–37)
Calcium: 8.6 mg/dL (ref 8.4–10.5)
Creatinine, Ser: 1.19 mg/dL (ref 0.50–1.35)
GFR calc Af Amer: 74 mL/min — ABNORMAL LOW (ref >90)
GFR calc non Af Amer: 64 mL/min — ABNORMAL LOW (ref >90)
Glucose, Bld: 183 mg/dL — ABNORMAL HIGH (ref 70–99)
Sodium: 126 mEq/L — ABNORMAL LOW (ref 135–145)
Total Protein: 6.9 g/dL (ref 6.0–8.3)

## 2011-12-12 LAB — CBC
HCT: 30.6 % — ABNORMAL LOW (ref 39.0–52.0)
MCV: 86.9 fL (ref 78.0–100.0)
Platelets: 188 10*3/uL (ref 150–400)
RBC: 3.52 MIL/uL — ABNORMAL LOW (ref 4.22–5.81)
WBC: 9.7 10*3/uL (ref 4.0–10.5)

## 2011-12-12 LAB — DIFFERENTIAL
Eosinophils Relative: 1 % (ref 0–5)
Lymphocytes Relative: 10 % — ABNORMAL LOW (ref 12–46)
Lymphs Abs: 1 10*3/uL (ref 0.7–4.0)
Monocytes Absolute: 0.8 10*3/uL (ref 0.1–1.0)

## 2011-12-12 LAB — TSH: TSH: 2.382 u[IU]/mL (ref 0.350–4.500)

## 2011-12-12 LAB — CARDIAC PANEL(CRET KIN+CKTOT+MB+TROPI)
Relative Index: INVALID (ref 0.0–2.5)
Total CK: 18 U/L (ref 7–232)

## 2011-12-12 LAB — GLUCOSE, CAPILLARY: Glucose-Capillary: 154 mg/dL — ABNORMAL HIGH (ref 70–99)

## 2011-12-12 LAB — PHOSPHORUS: Phosphorus: 3.4 mg/dL (ref 2.3–4.6)

## 2011-12-12 MED ORDER — OXYCODONE HCL 10 MG PO TABS: 10.0000 mg | ORAL_TABLET | ORAL | Status: DC | PRN

## 2011-12-12 MED ORDER — FUROSEMIDE 80 MG PO TABS: 80.0000 mg | ORAL_TABLET | Freq: Two times a day (BID) | ORAL | Status: DC

## 2011-12-12 MED ORDER — INSULIN DETEMIR 100 UNIT/ML ~~LOC~~ SOLN: 44.0000 [IU] | Freq: Every day | SUBCUTANEOUS | Status: DC

## 2011-12-12 NOTE — Progress Notes (Signed)
CSW spoke with the pt and his wife and answered questions the pt had regarding his admission to [facility]. CSW faxed over discharge clinicals to the facility and confirmed they were received by [facility]. Duwayne Heck reports CSW can arrange transportation for the pt. CSW spoke with the pts RN Belenda Cruise and she is aware that pt can discharge to the facility. CSW has pre-arranged transport with [facility] to pick the pt up at 2pm. Pt RN aware. CSW has compiled clinicals to transport with the pt. Pt will complete his own paperwork once he arrives at the facility. CSW signing off. Golden Pop 12/12/2011 12:58 PM [phone]

## 2011-12-12 NOTE — Discharge Summary (Signed)
[name] MRN: [id number] DOB/AGE: 1951/01/09 61 y.o.  Admit date: 12/03/2011 Discharge date: 12/12/2011  Primary Care Physician:  REED,TIFFANY L., DO   Discharge Diagnoses:   Patient Active Problem List  Diagnoses  . AODM  . OBSTRUCTIVE SLEEP APNEA  . Essential hypertension, benign  . Cirrhosis  . Coronary atherosclerosis of native coronary artery  . Hyperlipidemia  . Morbid obesity  . Arthritis pain of hip  . CRI (chronic renal insufficiency)  . NAFLD (nonalcoholic fatty liver disease)  . Anemia  . Hepatic encephalopathy  . Enterococcus UTI  . RLS (restless legs syndrome)  . Reflux  . Diabetes mellitus  . ARF (acute renal failure)  . E. coli UTI  . Hyponatremia  . Hypothyroidism  . FTT (failure to thrive) in adult  . Hyperkalemia    DISCHARGE MEDICATION: Current Discharge Medication List    START taking these medications   Details  oxyCODONE 10 MG TABS Take 1 tablet (10 mg total) by mouth every 4 (four) hours as needed. Qty: 30 tablet, Refills: 0      CONTINUE these medications which have CHANGED   Details  furosemide (LASIX) 80 MG tablet Take 1 tablet (80 mg total) by mouth 2 (two) times daily.    insulin detemir (LEVEMIR) 100 UNIT/ML injection Inject 44 Units into the skin at bedtime. Qty: 10 mL      CONTINUE these medications which have NOT CHANGED   Details  aspirin 81 MG tablet Take 81 mg by mouth every morning.     docusate sodium (COLACE) 100 MG capsule Take 100 mg by mouth 2 (two) times daily.      insulin aspart (NOVOLOG) 100 UNIT/ML injection Inject 6 Units into the skin 3 (three) times daily with meals. Qty: 1 vial, Refills: 1    lactulose (CHRONULAC) 10 GM/15ML solution Take 30 g by mouth 3 (three) times daily.      levothyroxine (SYNTHROID, LEVOTHROID) 50 MCG tablet Take 50 mcg by mouth every morning.     lidocaine (LIDODERM) 5 % Place 1 patch onto the skin daily as needed. Remove & Discard patch within 12 hours or as directed by  MD..PAIN    metoprolol succinate (TOPROL-XL) 25 MG 24 hr tablet 12.5 mg every morning.     Multiple Vitamin (MULTIVITAMIN) tablet Take 1 tablet by mouth daily.      nystatin (NYSTOP) 100000 UNIT/GM POWD Apply topically daily. topically     pantoprazole (PROTONIX) 40 MG tablet Take 40 mg by mouth daily at 12 noon.      rifaximin (XIFAXAN) 550 MG TABS Take 1 tablet (550 mg total) by mouth 2 (two) times daily. Qty: 60 tablet, Refills: 11    rOPINIRole (REQUIP) 1 MG tablet Take 1 tablet (1 mg total) by mouth at bedtime. Qty: 30 tablet, Refills: 0    nitroGLYCERIN (NITROSTAT) 0.4 MG SL tablet Place 0.4 mg under the tongue every 5 (five) minutes as needed. CHEST PAIN      STOP taking these medications     methocarbamol (ROBAXIN) 500 MG tablet      oxycodone (OXY-IR) 5 MG capsule      spironolactone (ALDACTONE) 50 MG tablet            Consults: Treatment Team:  Trevor Iha, MD   SIGNIFICANT DIAGNOSTIC STUDIES:  Dg Lumbar Spine 2-3 Views  11/20/2011  *RADIOLOGY REPORT*  Clinical Data: Low back pain for 1 week.  LUMBAR SPINE - 2-3 VIEW  Comparison: CT abdomen and pelvis  05/28/2011 reviewed.  Findings: The patient has a superior endplate compression fracture of L5 which is new since the comparison examination.  Vertebral body height loss is estimated at 50%.  Vertebral body height is otherwise maintained.  Alignment is normal. Facet degenerative disease in the lower lumbar spine is noted.  Right total hip replacement is noted.  IMPRESSION: L5 superior endplate compression fracture is new since exam 05/28/2011 although its exact age cannot be determined. Per CMS PQRS reporting requirements (PQRS Measure 24): Given the patient's age of greater than 50 and the fracture site (hip, distal radius, or spine), the patient should be tested for osteoporosis using DXA, and the appropriate treatment considered based on the DXA results.  Original Report Authenticated By: Bernadene Bell. Maricela Curet,  M.D.   Ct Head Wo Contrast  12/03/2011  *RADIOLOGY REPORT*  Clinical Data: Altered mental status  CT HEAD WITHOUT CONTRAST  Technique:  Contiguous axial images were obtained from the base of the skull through the vertex without contrast.  Comparison: [place] CT head dated 08/28/2011  Findings: No evidence of parenchymal hemorrhage or extra-axial fluid collection. No mass lesion, mass effect, or midline shift.  No CT evidence of acute infarction.  Cerebral volume is age appropriate.  No ventriculomegaly.  The visualized paranasal sinuses are essentially clear. The mastoid air cells are unopacified.  No evidence of calvarial fracture.  IMPRESSION: No evidence of acute intracranial abnormality.  Original Report Authenticated By: Charline Bills, M.D.     Recent Results (from the past 240 hour(s))  URINE CULTURE     Status: Normal   Collection Time   12/03/11 12:29 PM      Component Value Range Status Comment   Specimen Description URINE, RANDOM   Final    Special Requests NONE   Final    Setup Time [id number]   Final    Colony Count >=100,000 COLONIES/ML   Final    Culture ESCHERICHIA COLI   Final    Report Status 12/05/2011 FINAL   Final    Organism ID, Bacteria ESCHERICHIA COLI   Final     BRIEF ADMITTING H & P:  [name] is an 61 y.o. 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 The patient's wife called EMS due to altered mental status. [name] has not been taking the lactulose 3 times a day as prescribed. [name] reports that [name] hasn't been sleeping well. [name] cannot provide many details about what triggered his wife to call EMS. No family is currently present. According to Dr. Harmon Pier notes, the patient's wife tried to reach him by telephone today, and when [name] did not answer the phone, sent her daughter to check on him and his daughter found him excessively somnolent and drowsy, and not making much sense with  verbalization. She then called EMS.    Hospital Course:  Present on Admission:   .Hepatic encephalopathy: Patient arrived to the emergency room in an encephalopathic which is likely multifactorial owing to renal failure hepatic decompensation and dehydration. state however had an ammonia level of only 47. [name] was treated with lactulose and Xifixan. The patient is also given IV fluids and his diuretics were placed on hold. His mental state improved and the patient is now back to baseline. His renal failure is improved as has his hyperkalemia. Adjustments were made to his diuretics and that his prolactin has been discontinued for now and his Lasix has been increased  to 80 mg by mouth twice a day by nephrology.  Marland KitchenAODM: Blood sugars are uncontrolled and mother Corrie Dandy has been increased to 44 units at each bedtime. Since [name] increase his blood sugars have been in the 150s.  .Cirrhosis: The patient is under care of Dr. Christella Hartigan gastroneurology. [name]'s continued on lactulose, Xifixan and diuretics. The patient was on spironolactone Lasix however due to hyperkalemia and renal insufficiency the spironolactone for discontinued and the patient is on Lasix 80 mg by mouth twice a day. The patient has an appointment to followup with Dr. Perry Mount this week instead That appointment  .CRI (chronic renal insufficiency): Patient does have chronic renal insufficiency which is improved with the discontinuation of the spinal accident in the adjustments of diuretics.   .Arthritis pain of hip: Under the care of Dr. Magnus Ivan   .NAFLD (nonalcoholic fatty liver disease): See above   .Anemia: Hemoglobin stable   .Morbid obesity: Noted   .Essential hypertension, benign: Blood pressure controlled   .ARF (acute renal failure): Felt to be secondary to diuretics as well as some degree of dehydration. The acute component has resolved since the patient has been rehydrated and diuretics have been adjusted.   .E. coli UTI: Fully  treated   .Hyponatremia: The patient has a sodium of 126 at the time of discharge. I had a discussion with nephrology about this. They feel that this will be the baseline for this patient on 125 Swan 20 seconds. The patient is asymptomatic however be expected with the use of diuretics we will see increase in the sodium.   Marland KitchenHypothyroidism: The patient shows no signs of disruption of his thyroid balance. However given the patient's hyponatremia TSH is being checked and is in process of present   .Hyperkalemia: Resolved with discontinuation of spironolactone  . Vertebral compression fracture: The patient has vertebral compression fracture. Dr. Magnus Ivan is seen this and is recommended in a corset for support for the patient. No surgical intervention is planned at this time   Disposition and Follow-up: Patient is being discharged to [facility] skilled nursing facility for continued therapy. Patient to followup with Dr. Christella Hartigan for GI appointment. Nephrology recommends that the patient has his renal function and electrolytes checked on a once weekly basis.  Discharge Orders    Future Appointments: Provider: Department: Dept Phone: Center:   12/16/2011 3:45 PM Rob Bunting, MD [facility] [facility] [phone] [facility]   12/27/2011 9:00 AM Micheline Chapman, MD [facility] [phone] [id number]   08/22/2012 1:30 PM Barbaraann Share, MD [facility] [phone] None      DISCHARGE EXAM:  Subjective: Patient states [name] feels well this morning. [name] is anticipating going to the skilled nursing facility to get on with his rehabilitation. The patient had an episode of indigestion this morning which improves after belching. [name] initially thought it might chest pressure however [name] states after taking his aspirin [name] belching the pain went away. His 12-lead EKG was found to be normal and his cardiac enzymes were also normal.  Objective: General: Alert, awake, oriented x3, in no acute distress. Blood  pressure 115/66, pulse 89, temperature 98.5 F (36.9 C), temperature source Oral, resp. rate 18, height 5\' 9"  (1.753 m), weight 111.8 kg (246 lb 7.6 oz), SpO2 99.00%.  HEENT: [place]/AT PEERL, EOMI  Neck: Trachea midline, no masses, no thyromegal,y no JVD, no carotid bruit  OROPHARYNX: Moist, No exudate/ erythema/lesions.  Heart: Regular rate and rhythm, without murmurs, rubs, gallops, PMI non-displaced, no heaves or thrills on palpation.  Lungs: Clear to auscultation, no wheezing or rhonchi noted. No increased vocal fremitus resonant to percussion  Abdomen: Soft, nontender, nondistended, positive bowel sounds, no masses no hepatosplenomegaly noted..  Neuro: No focal neurological deficits noted cranial nerves II through XII grossly intact. DTRs 2+ bilaterally upper and lower extremities. Strength 5 out of 5 in bilateral upper and lower extremities.  Musculoskeletal: No warm swelling or erythema around joints, no spinal tenderness noted.  Psychiatric: Patient alert and oriented x3, good insight and cognition, good recent to remote recall.  Lymph node survey: No cervical axillary or inguinal lymphadenopathy noted.   Basename 12/12/11 0540 12/11/11 0555  NA 126* 125*  K 5.0 5.3*  CL 91* 92*  CO2 27 28  GLUCOSE 183* 128*  BUN 35* 32*  CREATININE 1.19 1.20  CALCIUM 8.6 8.6  MG -- --  PHOS 3.4 3.5    Basename 12/12/11 0540 12/11/11 0555  AST 22 26  ALT 19 21  ALKPHOS 136* 141*  BILITOT 1.8* 1.6*  PROT 6.9 6.9  ALBUMIN 2.0* 2.0*   No results found for this basename: LIPASE:2,AMYLASE:2 in the last 72 hours  Basename 12/12/11 0540 12/11/11 0555  WBC 9.7 9.7  NEUTROABS 7.8* 7.6  HGB 10.8* 11.1*  HCT 30.6* 31.5*  MCV 86.9 87.7  PLT 188 185   Total time for discharge process including face-to-face time approximately 50 minutes Signed: Lataja Newland A. 12/12/2011, 11:40 AM

## 2011-12-12 NOTE — Progress Notes (Signed)
Subjective: Interval History: none.  Objective: Vital signs in last 24 hours: Temp:  [98 F (36.7 C)-98.8 F (37.1 C)] 98.5 F (36.9 C) (01/14 0832) Pulse Rate:  [76-95] 89  (01/14 0832) Resp:  [18-20] 18  (01/14 0832) BP: (113-131)/(65-81) 115/66 mmHg (01/14 0832) SpO2:  [95 %-99 %] 99 % (01/14 0832) Weight change:   Intake/Output from previous day: 01/13 0701 - 01/14 0700 In: 600 [P.O.:600] Out: 1450 [Urine:1450] Intake/Output this shift: Total I/O In: 240 [P.O.:240] Out: 500 [Urine:500]  General appearance: alert, cooperative, morbidly obese and pale Resp: diminished breath sounds bibasilar and rales bibasilar Cardio: S1, S2 normal and systolic murmur: holosystolic 2/6, blowing at apex GI: obese, pos bs, soft, liver down 5 cm Extremities: edema 2 plus presacral  Lab Results:  Basename 12/12/11 0540 12/11/11 0555  WBC 9.7 9.7  HGB 10.8* 11.1*  HCT 30.6* 31.5*  PLT 188 185   BMET:  Basename 12/12/11 0540 12/11/11 0555  NA 126* 125*  K 5.0 5.3*  CL 91* 92*  CO2 27 28  GLUCOSE 183* 128*  BUN 35* 32*  CREATININE 1.19 1.20  CALCIUM 8.6 8.6   No results found for this basename: PTH:2 in the last 72 hours Iron Studies: No results found for this basename: IRON,TIBC,TRANSFERRIN,FERRITIN in the last 72 hours  Studies/Results: No results found.  I have reviewed the patient's current medications.  Assessment/Plan: 1 Hyponatremia stable to ^, cont Lasix at rehab and fluid restrict 2 ^ K avoid spironolactone with CKD adn DM 3 Cirrhosis primary issue 4 DM   P t rehab., Lasix , fluid restrict    LOS: 9 days   Tiant Peixoto L 12/12/2011,12:18 PM

## 2011-12-12 NOTE — Progress Notes (Signed)
Pt c/o chest heaviness this am after eating breakfast. Did EKG - NSR and took a set of VSs  - BP115/66, P89, R18, T98.5, O2 99% RA.  Pt asked if he could take his 10am 81mg  aspirin early.  Did adm aspirin at that time.  After getting EKG and taking VSs, pt stated that he now felt fine and no longer had any chest heaviness.  He stated that it was probably indigestion.  Potassium this am was 5.0 and Sodium was 126.  He said that he still wanted to be d/c'd today.

## 2011-12-14 ENCOUNTER — Emergency Department (HOSPITAL_COMMUNITY): Payer: 59

## 2011-12-14 ENCOUNTER — Telehealth: Payer: Self-pay | Admitting: Gastroenterology

## 2011-12-14 ENCOUNTER — Inpatient Hospital Stay (HOSPITAL_COMMUNITY)
Admission: EM | Admit: 2011-12-14 | Discharge: 2011-12-19 | DRG: 441 | Disposition: A | Discharge status: Skilled Nursing Facility | Payer: 59 | Attending: Internal Medicine | Admitting: Internal Medicine

## 2011-12-14 ENCOUNTER — Encounter (HOSPITAL_COMMUNITY): Payer: Self-pay | Admitting: *Deleted

## 2011-12-14 DIAGNOSIS — M1610 Unilateral primary osteoarthritis, unspecified hip: Secondary | ICD-10-CM

## 2011-12-14 DIAGNOSIS — B952 Enterococcus as the cause of diseases classified elsewhere: Secondary | ICD-10-CM

## 2011-12-14 DIAGNOSIS — D649 Anemia, unspecified: Secondary | ICD-10-CM

## 2011-12-14 DIAGNOSIS — I10 Essential (primary) hypertension: Secondary | ICD-10-CM

## 2011-12-14 DIAGNOSIS — K7689 Other specified diseases of liver: Principal | ICD-10-CM | POA: Diagnosis present

## 2011-12-14 DIAGNOSIS — Z9861 Coronary angioplasty status: Secondary | ICD-10-CM

## 2011-12-14 DIAGNOSIS — K760 Fatty (change of) liver, not elsewhere classified: Secondary | ICD-10-CM

## 2011-12-14 DIAGNOSIS — I252 Old myocardial infarction: Secondary | ICD-10-CM

## 2011-12-14 DIAGNOSIS — G2581 Restless legs syndrome: Secondary | ICD-10-CM

## 2011-12-14 DIAGNOSIS — N179 Acute kidney failure, unspecified: Secondary | ICD-10-CM

## 2011-12-14 DIAGNOSIS — M109 Gout, unspecified: Secondary | ICD-10-CM | POA: Diagnosis present

## 2011-12-14 DIAGNOSIS — N390 Urinary tract infection, site not specified: Secondary | ICD-10-CM

## 2011-12-14 DIAGNOSIS — N183 Chronic kidney disease, stage 3 unspecified: Secondary | ICD-10-CM | POA: Diagnosis present

## 2011-12-14 DIAGNOSIS — E785 Hyperlipidemia, unspecified: Secondary | ICD-10-CM

## 2011-12-14 DIAGNOSIS — K7460 Unspecified cirrhosis of liver: Secondary | ICD-10-CM

## 2011-12-14 DIAGNOSIS — I509 Heart failure, unspecified: Secondary | ICD-10-CM | POA: Diagnosis present

## 2011-12-14 DIAGNOSIS — IMO0001 Reserved for inherently not codable concepts without codable children: Secondary | ICD-10-CM

## 2011-12-14 DIAGNOSIS — E860 Dehydration: Secondary | ICD-10-CM

## 2011-12-14 DIAGNOSIS — E236 Other disorders of pituitary gland: Secondary | ICD-10-CM | POA: Diagnosis present

## 2011-12-14 DIAGNOSIS — N189 Chronic kidney disease, unspecified: Secondary | ICD-10-CM

## 2011-12-14 DIAGNOSIS — E039 Hypothyroidism, unspecified: Secondary | ICD-10-CM

## 2011-12-14 DIAGNOSIS — R4182 Altered mental status, unspecified: Secondary | ICD-10-CM

## 2011-12-14 DIAGNOSIS — K7682 Hepatic encephalopathy: Secondary | ICD-10-CM

## 2011-12-14 DIAGNOSIS — I129 Hypertensive chronic kidney disease with stage 1 through stage 4 chronic kidney disease, or unspecified chronic kidney disease: Secondary | ICD-10-CM | POA: Diagnosis present

## 2011-12-14 DIAGNOSIS — E875 Hyperkalemia: Secondary | ICD-10-CM

## 2011-12-14 DIAGNOSIS — G4733 Obstructive sleep apnea (adult) (pediatric): Secondary | ICD-10-CM

## 2011-12-14 DIAGNOSIS — R627 Adult failure to thrive: Secondary | ICD-10-CM

## 2011-12-14 DIAGNOSIS — M129 Arthropathy, unspecified: Secondary | ICD-10-CM | POA: Diagnosis present

## 2011-12-14 DIAGNOSIS — I2510 Atherosclerotic heart disease of native coronary artery without angina pectoris: Secondary | ICD-10-CM

## 2011-12-14 DIAGNOSIS — E119 Type 2 diabetes mellitus without complications: Secondary | ICD-10-CM

## 2011-12-14 DIAGNOSIS — E871 Hypo-osmolality and hyponatremia: Secondary | ICD-10-CM | POA: Diagnosis present

## 2011-12-14 DIAGNOSIS — K7290 Hepatic failure, unspecified without coma: Secondary | ICD-10-CM | POA: Diagnosis present

## 2011-12-14 HISTORY — DX: Cardiac murmur, unspecified: R01.1

## 2011-12-14 HISTORY — DX: Hyperkalemia: E87.5

## 2011-12-14 HISTORY — DX: Hypo-osmolality and hyponatremia: E87.1

## 2011-12-14 LAB — CBC
MCV: 86.8 fL (ref 78.0–100.0)
Platelets: 218 10*3/uL (ref 150–400)
RBC: 3.94 MIL/uL — ABNORMAL LOW (ref 4.22–5.81)
RDW: 14.5 % (ref 11.5–15.5)
WBC: 9.5 10*3/uL (ref 4.0–10.5)

## 2011-12-14 LAB — URINALYSIS, ROUTINE W REFLEX MICROSCOPIC
Bilirubin Urine: NEGATIVE
Hgb urine dipstick: NEGATIVE
Ketones, ur: NEGATIVE mg/dL
Nitrite: NEGATIVE
Specific Gravity, Urine: 1.012 (ref 1.005–1.030)
Urobilinogen, UA: 1 mg/dL (ref 0.0–1.0)

## 2011-12-14 LAB — DIFFERENTIAL
Basophils Absolute: 0 10*3/uL (ref 0.0–0.1)
Eosinophils Relative: 0 % (ref 0–5)
Lymphocytes Relative: 11 % — ABNORMAL LOW (ref 12–46)
Lymphs Abs: 1.1 10*3/uL (ref 0.7–4.0)
Neutro Abs: 7.9 10*3/uL — ABNORMAL HIGH (ref 1.7–7.7)
Neutrophils Relative %: 83 % — ABNORMAL HIGH (ref 43–77)

## 2011-12-14 LAB — URINE CULTURE
Culture  Setup Time: 201301170131
Culture: NO GROWTH

## 2011-12-14 LAB — COMPREHENSIVE METABOLIC PANEL
ALT: 18 U/L (ref 0–53)
AST: 21 U/L (ref 0–37)
Alkaline Phosphatase: 147 U/L — ABNORMAL HIGH (ref 39–117)
CO2: 26 mEq/L (ref 19–32)
Calcium: 9.3 mg/dL (ref 8.4–10.5)
Chloride: 91 mEq/L — ABNORMAL LOW (ref 96–112)
GFR calc Af Amer: 46 mL/min — ABNORMAL LOW (ref >90)
GFR calc non Af Amer: 39 mL/min — ABNORMAL LOW (ref >90)
Glucose, Bld: 251 mg/dL — ABNORMAL HIGH (ref 70–99)
Potassium: 4.7 mEq/L (ref 3.5–5.1)
Sodium: 127 mEq/L — ABNORMAL LOW (ref 135–145)

## 2011-12-14 LAB — URINE MICROSCOPIC-ADD ON

## 2011-12-14 LAB — APTT: aPTT: 38 seconds — ABNORMAL HIGH (ref 24–37)

## 2011-12-14 LAB — AMMONIA: Ammonia: 41 umol/L (ref 11–60)

## 2011-12-14 LAB — PROTIME-INR: Prothrombin Time: 20.5 seconds — ABNORMAL HIGH (ref 11.6–15.2)

## 2011-12-14 MED ORDER — SODIUM CHLORIDE 0.9 % IV BOLUS (SEPSIS)
500.0000 mL | Freq: Once | INTRAVENOUS | Status: AC
  Administered 2011-12-14: 500 mL via INTRAVENOUS

## 2011-12-14 MED ORDER — SODIUM CHLORIDE 0.9 % IV SOLN
Freq: Once | INTRAVENOUS | Status: AC
  Administered 2011-12-14: 19:00:00 via INTRAVENOUS

## 2011-12-14 MED ORDER — SODIUM CHLORIDE 0.9 % IV BOLUS (SEPSIS)
500.0000 mL | Freq: Once | INTRAVENOUS | Status: AC
  Administered 2011-12-15: 500 mL via INTRAVENOUS

## 2011-12-14 NOTE — Telephone Encounter (Signed)
Left message on machine to call back ather work and cell number

## 2011-12-14 NOTE — ED Notes (Signed)
Pt received via ems. Noted with altered mental status. Follows commands, however only oriented to person. Wife reports pt has similar episodes with elevated ammonia level d/t cirrhosis. Vitals noted stable. Resp are unlabored. Skin is warm and dry. NSR on monitor. Continuous cardiac, bp and pulse ox monitor. IV placed with blood draw. Sr up. Wife at bedside.

## 2011-12-14 NOTE — Telephone Encounter (Signed)
Pt is having encephalopathy episode again and is at [facility] on [address].  The pt's wife is concerned because she says he was doing ok having normal bowel movements and is not sure why he keeps having this issue.  She is having his labs and records here tomorrow and would like Dr Christella Hartigan to review.  I will put them on your desk as soon as they are faxed

## 2011-12-14 NOTE — ED Notes (Signed)
Patient transported to CT in no acute distress.  

## 2011-12-14 NOTE — ED Notes (Signed)
CBG 279. EMS pt is from [facility] rehab. Wife states the pt is altered, states pt usually gets this way when his ammonia level is high. Staff was unable to give much of a report on pt. 130/90 HR 102.

## 2011-12-14 NOTE — ED Notes (Signed)
Pt returned from ct in no acute distress. Wife at bedside. No acute changes in status. Remains pleasantly confused.

## 2011-12-14 NOTE — ED Notes (Signed)
Patient transported to X-ray in no acute distress.  

## 2011-12-14 NOTE — ED Provider Notes (Signed)
History     CSN: [id number]  Arrival date & time 12/14/11  1821   First MD Initiated Contact with Patient 12/14/11 1830      Chief Complaint  Patient presents with  . Altered Mental Status    (Consider location/radiation/quality/duration/timing/severity/associated sxs/prior treatment) HPI Comments: Wife reports that patient has a history of Hepatic Encephalopathy caused by Nonalcoholic cirrhosis of the liver.  He currently takes Lactulose three times a day.  Last BM was this morning.  He is currently living at [facility].   Wife reports that earlier today she noticed that he was having some difficulty communicating.  He was having a difficult time finding words and appeared to be confused.  Wife reports that he has had similar symptoms to this in the past when his ammonia level was high.   Review of the chart shows that patient was admitted wit similar symptoms on 12/01/11.  At that time he was found to have a UTI and was dehydrated but his ammonia level was in the normal range.    Patient is a 61 y.o. male presenting with altered mental status. The history is provided by the patient and the spouse.  Altered Mental Status Pertinent negatives include no abdominal pain, chest pain, chills, fever, nausea, rash or vomiting.    Past Medical History  Diagnosis Date  . Hypertension   . Type II or unspecified type diabetes mellitus without mention of complication, not stated as uncontrolled   . Thyroid disease   . Cirrhosis, non-alcoholic   . Morbid obesity   . Pancytopenia   . Fatty liver   . Coronary artery disease   . Myocardial infarction     "HEART INCIDENT " OCT 2012  . Angina     RARE USE OF NTG  . CHF (congestive heart failure)     JUNE 2012  . Arthritis   . Gout 35 YRS AGO  . Cellulitis of left leg   . Cirrhosis of liver     NONALCOHOLIC  . Nocturia   . Elevated BUN   . Elevated serum creatinine   . Serum ammonia increased   . Splenomegaly   . Platelets decreased   .  Thyroid disorder     PT DOES NOT KNOW IF LOW OR HIGH  . Chronic kidney disease (CKD), stage III (moderate)   . Obstructive sleep apnea   . Hyponatremia   . Hyperkalemia     Past Surgical History  Procedure Date  . Coronary stent placement     2 CARDIAC STENTS OCT 08/2011  . Total knee arthroplasty 10/14/2011  . Total hip arthroplasty 10/14/2011    Procedure: TOTAL HIP ARTHROPLASTY ANTERIOR APPROACH;  Surgeon: Kathryne Hitch;  Location: [facility]S;  Service: Orthopedics;  Laterality: Right;  . Joint replacement   . Appendectomy 1968  . Tonsillectomy 1957  . Cardiac catheterization 09/08/2011    Family History  Problem Relation Age of Onset  . Colon cancer Neg Hx   . Diabetes Mother   . Diabetes Father     History  Substance Use Topics  . Smoking status: Never Smoker   . Smokeless tobacco: Never Used  . Alcohol Use: No      Review of Systems  Constitutional: Negative for fever and chills.  Eyes: Negative for visual disturbance.  Respiratory: Negative for shortness of breath.   Cardiovascular: Negative for chest pain.  Gastrointestinal: Negative for nausea, vomiting, abdominal pain and constipation.  Genitourinary: Negative for dysuria.  Skin: Negative for  rash.  Neurological: Positive for speech difficulty. Negative for dizziness, syncope and light-headedness.  Psychiatric/Behavioral: Positive for confusion, sleep disturbance and altered mental status. Negative for hallucinations.    Allergies  Crestor and Statins  Home Medications   Current Outpatient Rx  Name Route Sig Dispense Refill  . ASPIRIN 81 MG PO TABS Oral Take 81 mg by mouth every morning.     Marland Kitchen DOCUSATE SODIUM 100 MG PO CAPS Oral Take 100 mg by mouth 2 (two) times daily.      . FUROSEMIDE 80 MG PO TABS Oral Take 80 mg by mouth 2 (two) times daily.    . INSULIN ASPART 100 UNIT/ML [place] SOLN Subcutaneous Inject 6 Units into the skin 3 (three) times daily with meals. As directed per sliding scale      . INSULIN DETEMIR 100 UNIT/ML [address] SOLN Subcutaneous Inject 44 Units into the skin at bedtime.    Marland Kitchen LACTULOSE 10 GM/15ML PO SOLN Oral Take 30 g by mouth 3 (three) times daily.      Marland Kitchen LEVOTHYROXINE SODIUM 50 MCG PO TABS Oral Take 50 mcg by mouth every morning.     Marland Kitchen LIDOCAINE 5 % EX PTCH Transdermal Place 1 patch onto the skin daily as needed. Remove & Discard patch within 12 hours or as directed by MD..PAIN    . METOPROLOL SUCCINATE ER 25 MG PO TB24  12.5 mg every morning.     . ADULT MULTIVITAMIN W/MINERALS CH Oral Take 1 tablet by mouth daily.    Marland Kitchen NITROGLYCERIN 0.4 MG SL SUBL Sublingual Place 0.4 mg under the tongue every 5 (five) minutes as needed. CHEST PAIN    . NYSTOP 100000 UNIT/GM EX POWD Topical Apply 1 g topically daily. topically    . OXYCODONE HCL ER 10 MG PO TB12 Oral Take 10 mg by mouth every 4 (four) hours as needed. For pain    . PANTOPRAZOLE SODIUM 40 MG PO TBEC Oral Take 40 mg by mouth daily at 12 noon.      Marland Kitchen RIFAXIMIN 550 MG PO TABS Oral Take 550 mg by mouth 2 (two) times daily.    Marland Kitchen ROPINIROLE HCL 1 MG PO TABS Oral Take 1 mg by mouth at bedtime.      BP 104/69  Pulse 100  Temp(Src) 97.6 F (36.4 C) (Oral)  Resp 18  SpO2 100%  Physical Exam  Nursing note and vitals reviewed. Constitutional: He appears well-developed and well-nourished. No distress.  HENT:  Head: Normocephalic and atraumatic.  Eyes: EOM are normal. Pupils are equal, round, and reactive to light.  Neck: Normal range of motion. Neck supple.  Cardiovascular: Normal rate, regular rhythm and normal heart sounds.   Pulmonary/Chest: Effort normal and breath sounds normal. No respiratory distress. He has no wheezes.  Abdominal: Soft. Bowel sounds are normal. He exhibits no distension and no mass. There is no tenderness. There is no rebound and no guarding.  Musculoskeletal: Normal range of motion.  Neurological: He has normal strength. No cranial nerve deficit or sensory deficit.       Patient is  orientated to person and place.  Patient is unable to state the year. During conversation patient is making some comments that do not make sense and is getting his words mixed up. Bilateral tremor of upper extremities observed. Asterixis present.  Skin: He is not diaphoretic.    ED Course  Procedures (including critical care time)  Labs Reviewed  CBC - Abnormal; Notable for the following:  RBC 3.94 (*)    Hemoglobin 12.6 (*)    HCT 34.2 (*)    MCHC 36.8 (*)    All other components within normal limits  DIFFERENTIAL - Abnormal; Notable for the following:    Neutrophils Relative 83 (*)    Neutro Abs 7.9 (*)    Lymphocytes Relative 11 (*)    All other components within normal limits  COMPREHENSIVE METABOLIC PANEL - Abnormal; Notable for the following:    Sodium 127 (*)    Chloride 91 (*)    Glucose, Bld 251 (*)    BUN 59 (*)    Creatinine, Ser 1.78 (*)    Albumin 2.2 (*)    Alkaline Phosphatase 147 (*)    Total Bilirubin 1.9 (*)    GFR calc non Af Amer 39 (*)    GFR calc Af Amer 46 (*)    All other components within normal limits  PROTIME-INR - Abnormal; Notable for the following:    Prothrombin Time 20.5 (*)    INR 1.72 (*)    All other components within normal limits  APTT - Abnormal; Notable for the following:    aPTT 38 (*)    All other components within normal limits  AMMONIA  URINALYSIS, ROUTINE W REFLEX MICROSCOPIC  URINE CULTURE   Dg Chest 2 View  12/14/2011  *RADIOLOGY REPORT*  Clinical Data: Chest pain, confusion, short of breath  CHEST - 2 VIEW  Comparison: 10/16/2011  Findings: Heart size there is a mildly enlarged.  Negative for heart failure.  Lungs are clear.  Negative for pneumonia or effusion.  IMPRESSION: No active cardiopulmonary disease.  Original Report Authenticated By: Camelia Phenes, M.D.     No diagnosis found.  12:23 AM  Discussed patient with [facility].  He reports that he will admit patient to a telemetry bed.  [facility] team  6.  MDM  Patient with AMS.  Negative UA.  Negative CT head.  Ammonia level in the normal range.  Patient admitted for further work up of AMS.        Pascal Lux Wingen 12/15/11 0202

## 2011-12-15 ENCOUNTER — Encounter (HOSPITAL_COMMUNITY): Payer: Self-pay | Admitting: Internal Medicine

## 2011-12-15 DIAGNOSIS — R4182 Altered mental status, unspecified: Secondary | ICD-10-CM

## 2011-12-15 LAB — MRSA PCR SCREENING: MRSA by PCR: NEGATIVE

## 2011-12-15 LAB — COMPREHENSIVE METABOLIC PANEL
ALT: 16 U/L (ref 0–53)
AST: 19 U/L (ref 0–37)
Albumin: 2.2 g/dL — ABNORMAL LOW (ref 3.5–5.2)
Alkaline Phosphatase: 141 U/L — ABNORMAL HIGH (ref 39–117)
Potassium: 4.8 mEq/L (ref 3.5–5.1)
Sodium: 129 mEq/L — ABNORMAL LOW (ref 135–145)
Total Protein: 7 g/dL (ref 6.0–8.3)

## 2011-12-15 LAB — AMMONIA: Ammonia: 253 umol/L — ABNORMAL HIGH (ref 11–60)

## 2011-12-15 LAB — CBC
HCT: 33.4 % — ABNORMAL LOW (ref 39.0–52.0)
MCHC: 35.6 g/dL (ref 30.0–36.0)
MCV: 87 fL (ref 78.0–100.0)
RDW: 14.8 % (ref 11.5–15.5)

## 2011-12-15 LAB — GLUCOSE, CAPILLARY
Glucose-Capillary: 179 mg/dL — ABNORMAL HIGH (ref 70–99)
Glucose-Capillary: 180 mg/dL — ABNORMAL HIGH (ref 70–99)
Glucose-Capillary: 266 mg/dL — ABNORMAL HIGH (ref 70–99)

## 2011-12-15 LAB — BASIC METABOLIC PANEL
BUN: 65 mg/dL — ABNORMAL HIGH (ref 6–23)
CO2: 22 mEq/L (ref 19–32)
Calcium: 8.8 mg/dL (ref 8.4–10.5)
Creatinine, Ser: 1.82 mg/dL — ABNORMAL HIGH (ref 0.50–1.35)
GFR calc Af Amer: 45 mL/min — ABNORMAL LOW (ref >90)

## 2011-12-15 MED ORDER — SODIUM CHLORIDE 0.9 % IV SOLN
INTRAVENOUS | Status: DC
  Administered 2011-12-15: 12:00:00 via INTRAVENOUS

## 2011-12-15 MED ORDER — DOCUSATE SODIUM 100 MG PO CAPS
100.0000 mg | ORAL_CAPSULE | Freq: Two times a day (BID) | ORAL | Status: DC
  Administered 2011-12-15 – 2011-12-19 (×8): 100 mg via ORAL
  Filled 2011-12-15 (×10): qty 1

## 2011-12-15 MED ORDER — ROPINIROLE HCL 1 MG PO TABS
1.0000 mg | ORAL_TABLET | Freq: Every day | ORAL | Status: DC
  Administered 2011-12-15 – 2011-12-18 (×5): 1 mg via ORAL
  Filled 2011-12-15 (×6): qty 1

## 2011-12-15 MED ORDER — NYSTATIN 100000 UNIT/GM EX POWD
1.0000 g | Freq: Every day | CUTANEOUS | Status: DC
  Administered 2011-12-15 – 2011-12-19 (×5): 100000 [IU] via TOPICAL
  Filled 2011-12-15 (×2): qty 15

## 2011-12-15 MED ORDER — ADULT MULTIVITAMIN W/MINERALS CH
1.0000 | ORAL_TABLET | Freq: Every day | ORAL | Status: DC
  Administered 2011-12-15 – 2011-12-19 (×5): 1 via ORAL
  Filled 2011-12-15 (×5): qty 1

## 2011-12-15 MED ORDER — INSULIN ASPART 100 UNIT/ML ~~LOC~~ SOLN
0.0000 [IU] | Freq: Three times a day (TID) | SUBCUTANEOUS | Status: DC
  Administered 2011-12-15: 2 [IU] via SUBCUTANEOUS
  Administered 2011-12-15: 7 [IU] via SUBCUTANEOUS
  Administered 2011-12-15: 5 [IU] via SUBCUTANEOUS
  Administered 2011-12-16: 2 [IU] via SUBCUTANEOUS
  Administered 2011-12-16 – 2011-12-17 (×3): 3 [IU] via SUBCUTANEOUS
  Administered 2011-12-17: 2 [IU] via SUBCUTANEOUS
  Administered 2011-12-17: 5 [IU] via SUBCUTANEOUS
  Administered 2011-12-18 (×2): 2 [IU] via SUBCUTANEOUS
  Administered 2011-12-18: 3 [IU] via SUBCUTANEOUS
  Administered 2011-12-19: 1 [IU] via SUBCUTANEOUS
  Filled 2011-12-15 (×2): qty 3

## 2011-12-15 MED ORDER — ONDANSETRON HCL 4 MG PO TABS: 4.0000 mg | ORAL_TABLET | Freq: Four times a day (QID) | ORAL | Status: DC | PRN

## 2011-12-15 MED ORDER — METOPROLOL SUCCINATE 12.5 MG HALF TABLET
12.5000 mg | ORAL_TABLET | Freq: Every day | ORAL | Status: DC
  Administered 2011-12-15 – 2011-12-19 (×5): 12.5 mg via ORAL
  Filled 2011-12-15 (×5): qty 1

## 2011-12-15 MED ORDER — ACETAMINOPHEN 650 MG RE SUPP: 650.0000 mg | Freq: Four times a day (QID) | RECTAL | Status: DC | PRN

## 2011-12-15 MED ORDER — LACTULOSE 10 GM/15ML PO SOLN
30.0000 g | Freq: Three times a day (TID) | ORAL | Status: DC
  Administered 2011-12-15 (×3): 30 g via ORAL
  Filled 2011-12-15 (×3): qty 45

## 2011-12-15 MED ORDER — LEVOTHYROXINE SODIUM 50 MCG PO TABS
50.0000 ug | ORAL_TABLET | ORAL | Status: DC
  Administered 2011-12-15 – 2011-12-19 (×5): 50 ug via ORAL
  Filled 2011-12-15 (×6): qty 1

## 2011-12-15 MED ORDER — SODIUM CHLORIDE 0.9 % IJ SOLN
3.0000 mL | Freq: Two times a day (BID) | INTRAMUSCULAR | Status: DC
  Administered 2011-12-15 – 2011-12-19 (×8): 3 mL via INTRAVENOUS

## 2011-12-15 MED ORDER — ACETAMINOPHEN 325 MG PO TABS: 650.0000 mg | ORAL_TABLET | Freq: Four times a day (QID) | ORAL | Status: DC | PRN

## 2011-12-15 MED ORDER — OXYCODONE HCL 5 MG PO TABS
10.0000 mg | ORAL_TABLET | ORAL | Status: DC | PRN
  Administered 2011-12-15 – 2011-12-19 (×4): 10 mg via ORAL
  Filled 2011-12-15: qty 2
  Filled 2011-12-15: qty 1
  Filled 2011-12-15 (×3): qty 2

## 2011-12-15 MED ORDER — NYSTATIN 100000 UNIT/GM EX POWD: CUTANEOUS | Status: DC | PRN

## 2011-12-15 MED ORDER — NITROGLYCERIN 0.4 MG SL SUBL: 0.4000 mg | SUBLINGUAL_TABLET | SUBLINGUAL | Status: DC | PRN

## 2011-12-15 MED ORDER — MUPIROCIN CALCIUM 2 % EX CREA
TOPICAL_CREAM | Freq: Two times a day (BID) | CUTANEOUS | Status: DC
  Administered 2011-12-15: 1 via TOPICAL
  Administered 2011-12-16 – 2011-12-19 (×7): via TOPICAL
  Filled 2011-12-15 (×2): qty 15

## 2011-12-15 MED ORDER — INSULIN DETEMIR 100 UNIT/ML ~~LOC~~ SOLN
44.0000 [IU] | Freq: Every day | SUBCUTANEOUS | Status: DC
  Administered 2011-12-15 – 2011-12-18 (×5): 44 [IU] via SUBCUTANEOUS
  Filled 2011-12-15: qty 3

## 2011-12-15 MED ORDER — ONDANSETRON HCL 4 MG/2ML IJ SOLN: 4.0000 mg | Freq: Four times a day (QID) | INTRAMUSCULAR | Status: DC | PRN

## 2011-12-15 MED ORDER — LIDOCAINE 5 % EX PTCH
1.0000 | MEDICATED_PATCH | Freq: Every day | CUTANEOUS | Status: DC | PRN
  Filled 2011-12-15: qty 1

## 2011-12-15 MED ORDER — PANTOPRAZOLE SODIUM 40 MG PO TBEC
40.0000 mg | DELAYED_RELEASE_TABLET | Freq: Every day | ORAL | Status: DC
  Administered 2011-12-15 – 2011-12-19 (×5): 40 mg via ORAL
  Filled 2011-12-15 (×5): qty 1

## 2011-12-15 MED ORDER — LACTULOSE 10 GM/15ML PO SOLN
30.0000 g | ORAL | Status: DC
  Administered 2011-12-16: 30 g via ORAL
  Filled 2011-12-15 (×3): qty 45

## 2011-12-15 MED ORDER — RIFAXIMIN 550 MG PO TABS
550.0000 mg | ORAL_TABLET | Freq: Two times a day (BID) | ORAL | Status: DC
  Administered 2011-12-15 – 2011-12-19 (×10): 550 mg via ORAL
  Filled 2011-12-15 (×11): qty 1

## 2011-12-15 NOTE — ED Notes (Signed)
Nurse unable to take report  

## 2011-12-15 NOTE — Progress Notes (Signed)
Inpatient Diabetes Program Recommendations  AACE/ADA: New Consensus Statement on Inpatient Glycemic Control (2009)  Target Ranges:  Prepandial:   less than 140 mg/dL      Peak postprandial:   less than 180 mg/dL (1-2 hours)      Critically ill patients:  140 - 180 mg/dL   Reason for Visit: Post-prandial hyperglycemia  Inpatient Diabetes Program Recommendations Insulin - Meal Coverage: xxx Pt needs meal coverage would start with 3-4 units tidwc  Note: I am familiar with this patient from stay at [facility] last week.  Pt takes 6 units tidwc at home of meal coverage. Thank you, Lenor Coffin, RN, CNS, Diabetes Coordinator [phone])

## 2011-12-15 NOTE — ED Notes (Signed)
Pt transported stable and in no acute distress to admx bed at this time.

## 2011-12-15 NOTE — Significant Event (Signed)
CC:RN called and said pt having waxing and waning confusion, but had a brief episode of slurred speech with difficulty word finding. NP immediately to bedside.  ROS: trouble thinking of words, some confusion at times. No HA. No changes in vision, hearing or strength. Is anxious at present because his mother-n-law was in a MVA and is downstairs in the ED. 2 BMs today. Exam: Alert, oriented to person, place, but not time. His speech is clear and appropriate. He does have trouble with word finding at times but is appropriately conversant. Neuro exam is intact. No facial droop. Tongue is midline. Strength is 5/5. PERRL. He follows commands quickly.  RRR. Lungs CTA. Abd soft. MOE x 4. No carotid bruit noted.  A/P: 1. Confusion, intermittent. His exam is not consistent with stroke. 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 Will check BMP and ammonia level now. May need to bump up his Lactulose. He has hyponatremia, but this is chronic and has actually come up some since admission. Creat is trending down.  Wife at bedside and updated. RN to call for changes. Will f/up labwork.  Maren Reamer, NP [facility]

## 2011-12-15 NOTE — Progress Notes (Signed)
Subjective: Patient seen and examined this am. Is abel to carry out conversations but does get confused with dates and place.  Objective:  Vital signs in last 24 hours:  Filed Vitals:   12/15/11 0149 12/15/11 0217 12/15/11 0706 12/15/11 1500  BP: 141/82  120/67 109/77  Pulse: 117  104 100  Temp: 97.6 F (36.4 C)  97.8 F (36.6 C) 97.3 F (36.3 C)  TempSrc: Oral  Oral   Resp: 20  18 18   Height:      Weight:      SpO2: 98% 98% 99% 99%    Intake/Output from previous day:   Intake/Output Summary (Last 24 hours) at 12/15/11 1603 Last data filed at 12/15/11 1500  Gross per 24 hour  Intake    720 ml  Output    251 ml  Net    469 ml    Physical Exam:  General:elderly male  in no acute distress. HEENT: no pallor, no icterus, dry oral mucosa, no JVD, no lymphadenopathy Heart: Normal  s1 &s2  Regular rate and rhythm, without murmurs, rubs, gallops. Lungs: Clear to auscultation bilaterally. Abdomen: Soft, nontender, nondistended, positive bowel sounds. Extremities: No clubbing cyanosis  1+ pedal edema b/l, positive pedal pulses. Neuro: Alert, awake, oriented x2 , flapping tremors present   Lab Results:  Basic Metabolic Panel:    Component Value Date/Time   NA 129* 12/15/2011 0548   K 4.8 12/15/2011 0548   CL 94* 12/15/2011 0548   CO2 23 12/15/2011 0548   BUN 63* 12/15/2011 0548   CREATININE 1.77* 12/15/2011 0548   GLUCOSE 190* 12/15/2011 0548   CALCIUM 9.1 12/15/2011 0548   CBC:    Component Value Date/Time   WBC 10.0 12/15/2011 0548   HGB 11.9* 12/15/2011 0548   HCT 33.4* 12/15/2011 0548   PLT 241 12/15/2011 0548   MCV 87.0 12/15/2011 0548   NEUTROABS 7.9* 12/14/2011 1835   LYMPHSABS 1.1 12/14/2011 1835   MONOABS 0.5 12/14/2011 1835   EOSABS 0.0 12/14/2011 1835   BASOSABS 0.0 12/14/2011 1835    Recent Results (from the past 240 hour(s))  MRSA PCR SCREENING     Status: Normal   Collection Time   12/15/11  3:44 AM      Component Value Range Status Comment   MRSA by PCR  NEGATIVE  NEGATIVE  Final     Studies/Results: Dg Chest 2 View  12/14/2011  *RADIOLOGY REPORT*  Clinical Data: Chest pain, confusion, short of breath  CHEST - 2 VIEW  Comparison: 10/16/2011  Findings: Heart size there is a mildly enlarged.  Negative for heart failure.  Lungs are clear.  Negative for pneumonia or effusion.  IMPRESSION: No active cardiopulmonary disease.  Original Report Authenticated By: Camelia Phenes, M.D.   Ct Head Wo Contrast  12/14/2011  *RADIOLOGY REPORT*  Clinical Data: Altered mental status.  CT HEAD WITHOUT CONTRAST  Technique:  Contiguous axial images were obtained from the base of the skull through the vertex without contrast.  Comparison: CT of the head performed 12/03/2011  Findings: There is no evidence of acute infarction, mass lesion, or intra- or extra-axial hemorrhage on CT.  The posterior fossa, including the cerebellum, brainstem and fourth ventricle, is within normal limits.  The third and lateral ventricles, and basal ganglia are unremarkable in appearance.  The cerebral hemispheres are symmetric in appearance, with normal gray- white differentiation.  No mass effect or midline shift is seen.  There is no evidence of fracture; visualized osseous structures  are unremarkable in appearance.  The orbits are within normal limits. The paranasal sinuses and mastoid air cells are well-aerated.  No significant soft tissue abnormalities are seen.  IMPRESSION: Unremarkable noncontrast CT of the head.  Original Report Authenticated By: Tonia Ghent, M.D.    Medications: Scheduled Meds:   . sodium chloride   Intravenous Once  . docusate sodium  100 mg Oral BID  . insulin aspart  0-9 Units Subcutaneous TID WC  . insulin detemir  44 Units Subcutaneous QHS  . lactulose  30 g Oral TID  . levothyroxine  50 mcg Oral Q0700  . metoprolol succinate  12.5 mg Oral Daily  . mulitivitamin with minerals  1 tablet Oral Daily  . mupirocin cream   Topical BID  . nystatin  1 g Topical  Daily  . pantoprazole  40 mg Oral Q1200  . rifaximin  550 mg Oral BID  . rOPINIRole  1 mg Oral QHS  . sodium chloride  500 mL Intravenous Once  . sodium chloride  500 mL Intravenous Once  . sodium chloride  3 mL Intravenous Q12H   Continuous Infusions:   . 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  Plan: Altered mental status  appears possibly a combination of  Hepatic encephalopathy with dehydration  also has AKI possibly prerenal in etiology. Also his lasix was increased on recent discharge Will give IV fluids and also continue lactulose titrating  to at least 3 BMs daily Cont rifaximin  follows with GI Dr Christella Hartigan and his team was informed about patient's admission.  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 recommended for fluid restriction to 1L on discharge per renal recently   AKI  possibly prerenal and patient appears dry as well Will hold lasix for now and give gentle hydration  will resume at lower dose once stable  monitor lytes in am  Cirrhosis with NASH Cont lactulose  Elevated INR.    DM cont lantus and SSI  CAD s/p stenting Stable  Hypothyroidism  cont synthroid     Full code   LOS: 1 day   Chardonnay Holzmann 12/15/2011, 4:03 PM

## 2011-12-15 NOTE — ED Provider Notes (Signed)
Medical screening examination/treatment/procedure(s) were performed by non-physician practitioner and as supervising physician I was immediately available for consultation/collaboration.  Auden Wettstein R. Ajani Schnieders, MD 12/15/11 1622 

## 2011-12-15 NOTE — Progress Notes (Signed)
Utilization review completed. [name] 12/15/2011 

## 2011-12-15 NOTE — H&P (Addendum)
[name] is an 61 y.o. male.   PCP - [facility] - Dr.Jacobs  Nephrology - Dr.Deterding. Chief Complaint: Confusion. 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 Yesterday at the nursing home patient was found to be increasingly confused and not finding words to talk. He was brought to ER. His creatinine was found 1.7 with a sodium of 127. Patient is afebrile. Did not have any nausea vomiting or abdominal pain or diarrhea as per patient's wife. Patient's ammonia level is within normal limits. Patient has been admitted for further observation. CT of the head was negative for anything acute.  Past Medical History  Diagnosis Date  . Hypertension   . Type II or unspecified type diabetes mellitus without mention of complication, not stated as uncontrolled   . Thyroid disease   . Cirrhosis, non-alcoholic   . Morbid obesity   . Pancytopenia   . Fatty liver   . Coronary artery disease   . Myocardial infarction     "HEART INCIDENT " OCT 2012  . Angina     RARE USE OF NTG  . CHF (congestive heart failure)     JUNE 2012  . Arthritis   . Gout 35 YRS AGO  . Cellulitis of left leg   . Cirrhosis of liver     NONALCOHOLIC  . Nocturia   . Elevated BUN   . Elevated serum creatinine   . Serum ammonia increased   . Splenomegaly   . Platelets decreased   . Thyroid disorder     PT DOES NOT KNOW IF LOW OR HIGH  . Chronic kidney disease (CKD), stage III (moderate)   . Obstructive sleep apnea   . Hyponatremia   . Hyperkalemia     Past Surgical History  Procedure Date  . Coronary stent placement     2 CARDIAC STENTS OCT 08/2011  . Total knee arthroplasty 10/14/2011  . 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 Joint replacement   . Appendectomy 1968  . Tonsillectomy 1957  . Cardiac catheterization 09/08/2011    Family History  Problem Relation Age of Onset  . Colon cancer Neg Hx   . Diabetes Mother   . Diabetes Father    Social History:  reports that he has never smoked. He has never used smokeless tobacco. He reports that he does not drink alcohol or use illicit drugs.  Allergies:  Allergies  Allergen Reactions  . Crestor (Rosuvastatin Calcium)     unknown  . Statins Other (See Comments)    PT DOES NOT REMEMBER    Medications Prior to Admission  Medication Dose Route Frequency Provider Last Rate Last Dose  . 0.9 %  sodium chloride infusion   Intravenous Once Toy Baker, MD 125 mL/hr at 12/14/11 1857    . acetaminophen (TYLENOL) tablet 650 mg  650 mg Oral Q6H PRN Eduard Clos, MD       Or  . acetaminophen (TYLENOL) suppository 650 mg  650 mg Rectal Q6H PRN Eduard Clos, MD      . docusate sodium (COLACE) capsule 100 mg  100 mg Oral BID Eduard Clos, MD      . insulin detemir (LEVEMIR) injection 44 Units  44 Units Subcutaneous QHS Eduard Clos, MD      . lactulose (CHRONULAC) 10 GM/15ML solution 30 g  30 g Oral TID Eduard Clos, MD      . levothyroxine (SYNTHROID, LEVOTHROID) tablet 50 mcg  50 mcg Oral Q0700 Eduard Clos, MD      . lidocaine (LIDODERM) 5 % 1 patch  1 patch Transdermal Daily PRN Eduard Clos, MD      . metoprolol succinate (TOPROL-XL) 24 hr tablet 12.5 mg  12.5 mg Oral Q0700 Eduard Clos, MD      . mulitivitamin with minerals tablet 1 tablet  1 tablet Oral Daily Eduard Clos, MD      . nitroGLYCERIN (NITROSTAT) SL tablet 0.4 mg  0.4 mg Sublingual Q5 min PRN Eduard Clos, MD      . nystatin (NYSTOP) topical  powder 100,000 Units  1 g Topical Daily Eduard Clos, MD      . ondansetron [facility]) tablet 4 mg  4 mg Oral Q6H PRN Eduard Clos, MD       Or  . ondansetron (ZOFRAN) injection 4 mg  4 mg Intravenous Q6H PRN Eduard Clos, MD      . oxyCODONE (OXYCONTIN) 12 hr tablet 10 mg  10 mg Oral Q4H PRN Eduard Clos, MD      . pantoprazole (PROTONIX) EC tablet 40 mg  40 mg Oral Q1200 Eduard Clos, MD      . rifaximin [employer]) tablet 550 mg  550 mg Oral BID Eduard Clos, MD      . rOPINIRole (REQUIP) tablet 1 mg  1 mg Oral QHS Eduard Clos, MD      . sodium chloride 0.9 % bolus 500 mL  500 mL Intravenous Once Pascal Lux Wingen   500 mL at 12/14/11 2259  . sodium chloride 0.9 % bolus 500 mL  500 mL Intravenous Once [employer] [facility]   500 mL at 12/15/11 0000  . sodium chloride 0.9 % injection 3 mL  3 mL Intravenous Q12H Eduard Clos, MD       Medications Prior to Admission  Medication Sig Dispense Refill  . aspirin 81 MG tablet Take 81 mg by mouth every morning.       . docusate sodium (COLACE) 100 MG capsule Take 100 mg by mouth 2 (two) times daily.        . furosemide (LASIX) 80 MG tablet Take 80 mg by mouth 2 (two) times daily.      . insulin aspart (NOVOLOG) 100 UNIT/ML injection Inject 6 Units into the skin 3 (three) times daily with meals. As directed per sliding scale      . insulin detemir (LEVEMIR) 100 UNIT/ML injection Inject 44 Units into the skin at bedtime.      Marland Kitchen lactulose (CHRONULAC) 10 GM/15ML solution Take 30 g by mouth 3 (three) times daily.        Marland Kitchen levothyroxine (SYNTHROID, LEVOTHROID) 50 MCG tablet Take 50 mcg by mouth every morning.       . lidocaine (LIDODERM) 5 % Place 1 patch onto the skin daily as needed. Remove & Discard patch within 12 hours or as directed by MD..PAIN      . metoprolol succinate (TOPROL-XL) 25 MG 24 hr tablet 12.5 mg every morning.       Marland Kitchen  nitroGLYCERIN (NITROSTAT) 0.4 MG SL tablet Place 0.4 mg  under the tongue every 5 (five) minutes as needed. CHEST PAIN      . nystatin (NYSTOP) 100000 UNIT/GM POWD Apply 1 g topically daily. topically      . pantoprazole (PROTONIX) 40 MG tablet Take 40 mg by mouth daily at 12 noon.        . rifaximin (XIFAXAN) 550 MG TABS Take 550 mg by mouth 2 (two) times daily.      Marland Kitchen rOPINIRole (REQUIP) 1 MG tablet Take 1 mg by mouth at bedtime.        Results for orders placed during the hospital encounter of 12/14/11 (from the past 48 hour(s))  AMMONIA     Status: Normal   Collection Time   12/14/11  6:35 PM      Component Value Range Comment   Ammonia 41  11 - 60 (umol/L)   CBC     Status: Abnormal   Collection Time   12/14/11  6:35 PM      Component Value Range Comment   WBC 9.5  4.0 - 10.5 (K/uL)    RBC 3.94 (*) 4.22 - 5.81 (MIL/uL)    Hemoglobin 12.6 (*) 13.0 - 17.0 (g/dL)    HCT 16.1 (*) 09.6 - 52.0 (%)    MCV 86.8  78.0 - 100.0 (fL)    MCH 32.0  26.0 - 34.0 (pg)    MCHC 36.8 (*) 30.0 - 36.0 (g/dL)    RDW 04.5  40.9 - 81.1 (%)    Platelets 218  150 - 400 (K/uL)   DIFFERENTIAL     Status: Abnormal   Collection Time   12/14/11  6:35 PM      Component Value Range Comment   Neutrophils Relative 83 (*) 43 - 77 (%)    Neutro Abs 7.9 (*) 1.7 - 7.7 (K/uL)    Lymphocytes Relative 11 (*) 12 - 46 (%)    Lymphs Abs 1.1  0.7 - 4.0 (K/uL)    Monocytes Relative 5  3 - 12 (%)    Monocytes Absolute 0.5  0.1 - 1.0 (K/uL)    Eosinophils Relative 0  0 - 5 (%)    Eosinophils Absolute 0.0  0.0 - 0.7 (K/uL)    Basophils Relative 0  0 - 1 (%)    Basophils Absolute 0.0  0.0 - 0.1 (K/uL)   COMPREHENSIVE METABOLIC PANEL     Status: Abnormal   Collection Time   12/14/11  6:35 PM      Component Value Range Comment   Sodium 127 (*) 135 - 145 (mEq/L)    Potassium 4.7  3.5 - 5.1 (mEq/L)    Chloride 91 (*) 96 - 112 (mEq/L)    CO2 26  19 - 32 (mEq/L)    Glucose, Bld 251 (*) 70 - 99 (mg/dL)    BUN 59 (*) 6 - 23 (mg/dL)    Creatinine, Ser 9.14 (*) 0.50 - 1.35 (mg/dL)      Calcium 9.3  8.4 - 10.5 (mg/dL)    Total Protein 7.4  6.0 - 8.3 (g/dL)    Albumin 2.2 (*) 3.5 - 5.2 (g/dL)    AST 21  0 - 37 (U/L)    ALT 18  0 - 53 (U/L)    Alkaline Phosphatase 147 (*) 39 - 117 (U/L)    Total Bilirubin 1.9 (*) 0.3 - 1.2 (mg/dL)    GFR calc non Af Amer 39 (*) >90 (mL/min)  GFR calc Af Amer 46 (*) >90 (mL/min)   PROTIME-INR     Status: Abnormal   Collection Time   12/14/11  6:35 PM      Component Value Range Comment   Prothrombin Time 20.5 (*) 11.6 - 15.2 (seconds)    INR 1.72 (*) 0.00 - 1.49    APTT     Status: Abnormal   Collection Time   12/14/11  6:35 PM      Component Value Range Comment   aPTT 38 (*) 24 - 37 (seconds)   URINALYSIS, ROUTINE W REFLEX MICROSCOPIC     Status: Abnormal   Collection Time   12/14/11  9:03 PM      Component Value Range Comment   Color, Urine YELLOW  YELLOW     APPearance CLEAR  CLEAR     Specific Gravity, Urine 1.012  1.005 - 1.030     pH 5.5  5.0 - 8.0     Glucose, UA NEGATIVE  NEGATIVE (mg/dL)    Hgb urine dipstick NEGATIVE  NEGATIVE     Bilirubin Urine NEGATIVE  NEGATIVE     Ketones, ur NEGATIVE  NEGATIVE (mg/dL)    Protein, ur NEGATIVE  NEGATIVE (mg/dL)    Urobilinogen, UA 1.0  0.0 - 1.0 (mg/dL)    Nitrite NEGATIVE  NEGATIVE     Leukocytes, UA TRACE (*) NEGATIVE    URINE MICROSCOPIC-ADD ON     Status: Abnormal   Collection Time   12/14/11  9:03 PM      Component Value Range Comment   WBC, UA 3-6  <3 (WBC/hpf)    RBC / HPF 0-2  <3 (RBC/hpf)    Bacteria, UA RARE  RARE     Casts HYALINE CASTS (*) NEGATIVE     Dg Chest 2 View  12/14/2011  *RADIOLOGY REPORT*  Clinical Data: Chest pain, confusion, short of breath  CHEST - 2 VIEW  Comparison: 10/16/2011  Findings: Heart size there is a mildly enlarged.  Negative for heart failure.  Lungs are clear.  Negative for pneumonia or effusion.  IMPRESSION: No active cardiopulmonary disease.  Original Report Authenticated By: Camelia Phenes, M.D.   Ct Head Wo Contrast  12/14/2011   *RADIOLOGY REPORT*  Clinical Data: Altered mental status.  CT HEAD WITHOUT CONTRAST  Technique:  Contiguous axial images were obtained from the base of the skull through the vertex without contrast.  Comparison: CT of the head performed 12/03/2011  Findings: There is no evidence of acute infarction, mass lesion, or intra- or extra-axial hemorrhage on CT.  The posterior fossa, including the cerebellum, brainstem and fourth ventricle, is within normal limits.  The third and lateral ventricles, and basal ganglia are unremarkable in appearance.  The cerebral hemispheres are symmetric in appearance, with normal gray- white differentiation.  No mass effect or midline shift is seen.  There is no evidence of fracture; visualized osseous structures are unremarkable in appearance.  The orbits are within normal limits. The paranasal sinuses and mastoid air cells are well-aerated.  No significant soft tissue abnormalities are seen.  IMPRESSION: Unremarkable noncontrast CT of the head.  Original Report Authenticated By: Tonia Ghent, M.D.    Review of Systems  Constitutional: Negative.   HENT: Negative.   Eyes: Negative.   Respiratory: Negative.   Cardiovascular: Negative.   Gastrointestinal: Negative.   Genitourinary: Negative.   Musculoskeletal: Negative.   Skin: Negative.   Neurological:       Confusion.  Endo/Heme/Allergies: Negative.   Psychiatric/Behavioral:  Negative.     Blood pressure 141/82, pulse 117, temperature 97.6 F (36.4 C), temperature source Oral, resp. rate 20, height 5\' 9"  (1.753 m), weight 108.3 kg (238 lb 12.1 oz), SpO2 98.00%. Physical Exam  Constitutional: He appears well-developed and well-nourished. No distress.  HENT:  Head: Normocephalic and atraumatic.  Right Ear: External ear normal.  Left Ear: External ear normal.  Nose: Nose normal.  Mouth/Throat: Oropharynx is clear and moist. No oropharyngeal exudate.  Eyes: Conjunctivae and EOM are normal. Pupils are equal, round,  and reactive to light. Right eye exhibits no discharge. Left eye exhibits no discharge. No scleral icterus.  Neck: Normal range of motion. Neck supple.  Cardiovascular: Normal rate, regular rhythm and normal heart sounds.   Respiratory: Effort normal and breath sounds normal. No respiratory distress. He has no wheezes. He has no rales.  GI: Soft. Bowel sounds are normal. He exhibits no distension. There is no tenderness. There is no rebound.  Musculoskeletal: He exhibits no edema and no tenderness.  Neurological: He is alert.       Moves upper and lower extremities. He is oriented to his name he is able to recognize his wife and he is able to tell where he is. Follows commands.  Skin: Skin is warm and dry. He is not diaphoretic.       Boil on the right flank.     Assessment/Plan #1. Encephalopathy - presently his wife who is at the bedside states he is going better after he has come to the hospital. He is become more alert and oriented. We did not exact cause for his brief confusion. Patient received some fluid in the ER. We'll recheck his labs again in a.m., and ammonia levels. Patient's UA #2. Acute renal failure with hyponatremia - his creatinine was 1.1 last and it is 1.7 now. For now I may get a stat urine sodium. Based on that we will plan if patient needs more fluid or not. We will hold his Lasix until then. Patient clinically looks dry. Patient's UA shows possibility of UTI will get a urine culture. #3. Diabetes mellitus type 2 - continue present dose of Levemir and closely follow CBG with sliding scale coverage. #4. History of CAD status post stenting and history of hypothyroidism - check TSH. Patient at this time does not have any shortness of breath. #5. 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 We also need to monitor his PT/INR which is mildly high at this time. #6.Small skin lesion on the right flank with mild pus - have sent  for cultures and placed on mupirocin.  CODE STATUS - full code.  Harace Mccluney N. 12/15/2011, 2:19 AM

## 2011-12-15 NOTE — Progress Notes (Signed)
1900 p send email to [place] md re pt's lapses of confusion and dsysarthria , [address] Craige Cotta  Called back will follow  Up pt

## 2011-12-16 ENCOUNTER — Ambulatory Visit: Payer: 59 | Admitting: Gastroenterology

## 2011-12-16 LAB — GLUCOSE, CAPILLARY
Glucose-Capillary: 160 mg/dL — ABNORMAL HIGH (ref 70–99)
Glucose-Capillary: 201 mg/dL — ABNORMAL HIGH (ref 70–99)

## 2011-12-16 LAB — BLOOD GAS, ARTERIAL
Bicarbonate: 23.4 mEq/L (ref 20.0–24.0)
FIO2: 0.21 %
O2 Saturation: 95.8 %
Patient temperature: 98.6

## 2011-12-16 LAB — BASIC METABOLIC PANEL
Calcium: 8.9 mg/dL (ref 8.4–10.5)
GFR calc non Af Amer: 44 mL/min — ABNORMAL LOW (ref >90)
Glucose, Bld: 225 mg/dL — ABNORMAL HIGH (ref 70–99)
Sodium: 131 mEq/L — ABNORMAL LOW (ref 135–145)

## 2011-12-16 LAB — URINE CULTURE

## 2011-12-16 LAB — AMMONIA: Ammonia: 141 umol/L — ABNORMAL HIGH (ref 11–60)

## 2011-12-16 MED ORDER — SODIUM CHLORIDE 0.9 % IV SOLN
INTRAVENOUS | Status: AC
  Administered 2011-12-16 (×2): via INTRAVENOUS

## 2011-12-16 MED ORDER — HALOPERIDOL LACTATE 5 MG/ML IJ SOLN
0.5000 mg | Freq: Once | INTRAMUSCULAR | Status: AC
  Administered 2011-12-16: 0.5 mg via INTRAVENOUS
  Filled 2011-12-16: qty 0.1

## 2011-12-16 MED ORDER — HALOPERIDOL LACTATE 5 MG/ML IJ SOLN
0.5000 mg | Freq: Once | INTRAMUSCULAR | Status: AC | PRN
  Filled 2011-12-16: qty 0.1

## 2011-12-16 MED ORDER — LACTULOSE 10 GM/15ML PO SOLN
30.0000 g | ORAL | Status: AC
  Administered 2011-12-16: 30 g
  Filled 2011-12-16: qty 45

## 2011-12-16 MED ORDER — LACTULOSE 10 GM/15ML PO SOLN
20.0000 g | Freq: Three times a day (TID) | ORAL | Status: DC
  Administered 2011-12-16 – 2011-12-18 (×7): 20 g
  Filled 2011-12-16 (×9): qty 30

## 2011-12-16 NOTE — Progress Notes (Signed)
I have seen and examined this patient and agree with the assessment/plan as outlined above by [facility] MD. Having some confusion and agitation at this time. Suspect hyponatremia and ARF may be due to volume depletion and appears to be improving on IV saline- will stop this today at 1700 and follow AM labs tomorrow. No acute electrolyte concerns noted.  Williette Loewe K.,MD 12/16/2011 10:26 AM

## 2011-12-16 NOTE — Progress Notes (Signed)
Subjective: Interval History: AMS secondary to hepatic encephalopathy. Reconsulted due to AKI with elevated cr to 1.8, BUN 65. Only 250 cc UOP recorded, likely due to incontinence.  Objective: Vital signs in last 24 hours: Temp:  [97.3 F (36.3 C)-98.3 F (36.8 C)] 97.5 F (36.4 C) (01/18 0500) Pulse Rate:  [94-108] 108  (01/18 0500) Resp:  [18-20] 20  (01/18 0500) BP: (108-136)/(73-85) 136/85 mmHg (01/18 0500) SpO2:  [98 %-99 %] 99 % (01/18 0500) Weight:  [241 lb 6.5 oz (109.5 kg)] 241 lb 6.5 oz (109.5 kg) (01/18 0500) Weight change: 2 lb 10.3 oz (1.2 kg)  Intake/Output from previous day: 01/17 0701 - 01/18 0700 In: 1275 [P.O.:960; I.V.:165; NG/GT:150] Out: 251 [Urine:250; Stool:1] Intake/Output this shift:   Intake/Output Summary (Last 24 hours) at 12/16/11 0804 Last data filed at 12/16/11 0300  Gross per 24 hour  Intake   1275 ml  Output    251 ml  Net   1024 ml      General appearance: obese, pale. Not oriented to place. Wrists in soft restraints.  Resp: diminished breath sounds bibasilar and rales bibasilar. No resp distress. Cardio: S1, S2 normal and systolic murmur: II/VI sys murmur at LLSB GI: abdomen soft, obese, nontender, nondistended. Extremities: no LE edema noted, moderate dependent edema in presacral area.  Lab Results:  [facility] 12/15/11 0548 12/14/11 1835  WBC 10.0 9.5  HGB 11.9* 12.6*  HCT 33.4* 34.2*  PLT 241 218   BMET:   Basename 12/16/11 0405 12/15/11 2044  NA 131* 128*  K 4.4 4.7  CL 97 94*  CO2 22 22  GLUCOSE 225* 255*  BUN 62* 65*  CREATININE 1.62* 1.82*  CALCIUM 8.9 8.8   No results found for this basename: PTH:2 in the last 72 hours Iron Studies: No results found for this basename: IRON,TIBC,TRANSFERRIN,FERRITIN in the last 72 hours  Studies/Results: Dg Chest 2 View  12/14/2011  *RADIOLOGY REPORT*  Clinical Data: Chest pain, confusion, short of breath  CHEST - 2 VIEW  Comparison: 10/16/2011  Findings: Heart size there is a  mildly enlarged.  Negative for heart failure.  Lungs are clear.  Negative for pneumonia or effusion.  IMPRESSION: No active cardiopulmonary disease.  Original Report Authenticated By: Camelia Phenes, M.D.   Ct Head Wo Contrast  12/14/2011  *RADIOLOGY REPORT*  Clinical Data: Altered mental status.  CT HEAD WITHOUT CONTRAST  Technique:  Contiguous axial images were obtained from the base of the skull through the vertex without contrast.  Comparison: CT of the head performed 12/03/2011  Findings: There is no evidence of acute infarction, mass lesion, or intra- or extra-axial hemorrhage on CT.  The posterior fossa, including the cerebellum, brainstem and fourth ventricle, is within normal limits.  The third and lateral ventricles, and basal ganglia are unremarkable in appearance.  The cerebral hemispheres are symmetric in appearance, with normal gray- white differentiation.  No mass effect or midline shift is seen.  There is no evidence of fracture; visualized osseous structures are unremarkable in appearance.  The orbits are within normal limits. The paranasal sinuses and mastoid air cells are well-aerated.  No significant soft tissue abnormalities are seen.  IMPRESSION: Unremarkable noncontrast CT of the head.  Original Report Authenticated By: Tonia Ghent, M.D.       . docusate sodium  100 mg Oral BID  . haloperidol lactate  0.5 mg Intravenous Once  . insulin aspart  0-9 Units Subcutaneous TID WC  . insulin detemir  44 Units Subcutaneous  QHS  . lactulose  20 g Per Tube TID  . lactulose  30 g Per Tube Q1H  . lactulose  30 g Per Tube Q1H  . levothyroxine  50 mcg Oral Q0700  . metoprolol succinate  12.5 mg Oral Daily  . mulitivitamin with minerals  1 tablet Oral Daily  . mupirocin cream   Topical BID  . nystatin  1 g Topical Daily  . pantoprazole  40 mg Oral Q1200  . rifaximin  550 mg Oral BID  . rOPINIRole  1 mg Oral QHS  . sodium chloride  3 mL Intravenous Q12H  . DISCONTD: lactulose  30 g  Oral TID  . DISCONTD: lactulose  30 g Oral Q1H    Assessment/Plan: 1. Hepatic encephalopathy. Lactulose per primary team. Secondary to NASH, cirrhosis. 2. AKI. Baseline 1.1-1.2, Cr peaked at 1.8 and has improved with 1L hydration, no strict ins/outs to assess actual fluid shifts but given pattern this is most likely prerenal. Only 240 cc intake charted on day of cr elevation. Do not see any history of diuretics in past 3 days, currently on 100cc/hr NS. Suspect this will improve renal perfusion but tenuous volume status. 3. Hyponatremia secondary to SIADH, cirrhosis, cont and fluid restriction. Off lasix due to prerenal failure. 4. Hyperkalemia resolved: avoid spironolactone  5. DM-per primary team, control appears good A1c 6.2     LOS: 2 days   Lorenzo Arscott 12/16/2011,8:03 AM

## 2011-12-16 NOTE — Significant Event (Signed)
RN called NP back later in shift. CC: pt becoming more sedated with inappropriate responses and non sensical speech. Won't take Lactulose po now. ROS: unable to do secondary to mental status EXAM: Awake but lethargic. Responds to name calling. Mumbling same words over and over. Not following commands or answering questions appropriately. PERRL. Moving all fours and no facial droop noted.  RRR. A/P:  1. Hepatic encephalopathy-worsening. His ammonia level checked after first event tonight was 253. Lactulose 30gms q1hr x 3 ordered but RN unable to get pt to swallow more than one dose. NGT placed and Lactulose to be given q1hr x 2. Recheck Ammonia level at 0500. Since NA urine was normal, IVF started at 100cc/hr.  Restraints ordered because pt is a fall risk and is pulled out IV which is interfering with tx.   0500: NP called RN. Pt woke up a little, but mostly still confused and becoming agitated. Pulling at NGT. 0.5mg  IV Haldol given x i. Pt has not had BM yet. Will follow ammonia result this a.m. and give more Lactulose as needed. Report to oncoming staff. RN to call for any further issues or deterioration in mental status. May want to consider MRI brain if worse, but this is likely from NASH/ammonia levels.  Maren Reamer, NP [facility]

## 2011-12-16 NOTE — Progress Notes (Signed)
Clinical Social Worker completed psychosocial assessment and placed in shadow chart. Pt is from [facility] and plans to return at discharge. Clinical Social Worker confirmed with Sonny Dandy that pt able to return when medically ready for discharge. Clinical Social Worker to facilitate pt discharge needs when pt medically ready.  Jacklynn Lewis, MSW, LCSWA  Clinical Social Work [phone]

## 2011-12-16 NOTE — Progress Notes (Signed)
Subjective: Patient seen and examined this am . Had eventful overnight with pt being very confused and lethargic. Did not have  any  BM yesterday. NG placed and lactulose given . Appears slightly more oriented today. Had large BM this am.  Objective:  Vital signs in last 24 hours:  Filed Vitals:   12/16/11 0114 12/16/11 0500 12/16/11 0944 12/16/11 1423  BP:  136/85 130/79 128/74  Pulse:  108 99 97  Temp: 98.1 F (36.7 C) 97.5 F (36.4 C)  98.8 F (37.1 C)  TempSrc: Axillary Axillary  Tympanic  Resp:  20  18  Height:      Weight:  109.5 kg (241 lb 6.5 oz)    SpO2:  99%  96%    Intake/Output from previous day:   Intake/Output Summary (Last 24 hours) at 12/16/11 1459 Last data filed at 12/16/11 1417  Gross per 24 hour  Intake   1980 ml  Output      0 ml  Net   1980 ml    Physical Exam:   General:elderly male in no acute distress.  HEENT: no pallor, no icterus, dry oral mucosa, no JVD, no lymphadenopathy , NG in place Heart: Normal s1 &s2 Regular rate and rhythm, without murmurs, rubs, gallops.  Lungs: Clear to auscultation bilaterally.  Abdomen: Soft, nontender, nondistended, positive bowel sounds.  Extremities: No clubbing cyanosis 1+ pedal edema b/l, positive pedal pulses.  Neuro: alert and awake but very confused,  flapping tremors present   Lab Results:  Basic Metabolic Panel:    Component Value Date/Time   NA 131* 12/16/2011 0405   K 4.4 12/16/2011 0405   CL 97 12/16/2011 0405   CO2 22 12/16/2011 0405   BUN 62* 12/16/2011 0405   CREATININE 1.62* 12/16/2011 0405   GLUCOSE 225* 12/16/2011 0405   CALCIUM 8.9 12/16/2011 0405   CBC:    Component Value Date/Time   WBC 10.0 12/15/2011 0548   HGB 11.9* 12/15/2011 0548   HCT 33.4* 12/15/2011 0548   PLT 241 12/15/2011 0548   MCV 87.0 12/15/2011 0548   NEUTROABS 7.9* 12/14/2011 1835   LYMPHSABS 1.1 12/14/2011 1835   MONOABS 0.5 12/14/2011 1835   EOSABS 0.0 12/14/2011 1835   BASOSABS 0.0 12/14/2011 1835    Recent Results  (from the past 240 hour(s))  URINE CULTURE     Status: Normal   Collection Time   12/14/11  9:03 PM      Component Value Range Status Comment   Specimen Description URINE, CATHETERIZED   Final    Special Requests NONE   Final    Setup Time [id number]   Final    Colony Count NO GROWTH   Final    Culture NO GROWTH   Final    Report Status 12/15/2011 FINAL   Final   MRSA PCR SCREENING     Status: Normal   Collection Time   12/15/11  3:44 AM      Component Value Range Status Comment   MRSA by PCR NEGATIVE  NEGATIVE  Final     Studies/Results: Dg Chest 2 View  12/14/2011  *RADIOLOGY REPORT*  Clinical Data: Chest pain, confusion, short of breath  CHEST - 2 VIEW  Comparison: 10/16/2011  Findings: Heart size there is a mildly enlarged.  Negative for heart failure.  Lungs are clear.  Negative for pneumonia or effusion.  IMPRESSION: No active cardiopulmonary disease.  Original Report Authenticated By: Camelia Phenes, M.D.   Ct Head Wo Contrast  12/14/2011  *RADIOLOGY REPORT*  Clinical Data: Altered mental status.  CT HEAD WITHOUT CONTRAST  Technique:  Contiguous axial images were obtained from the base of the skull through the vertex without contrast.  Comparison: CT of the head performed 12/03/2011  Findings: There is no evidence of acute infarction, mass lesion, or intra- or extra-axial hemorrhage on CT.  The posterior fossa, including the cerebellum, brainstem and fourth ventricle, is within normal limits.  The third and lateral ventricles, and basal ganglia are unremarkable in appearance.  The cerebral hemispheres are symmetric in appearance, with normal gray- white differentiation.  No mass effect or midline shift is seen.  There is no evidence of fracture; visualized osseous structures are unremarkable in appearance.  The orbits are within normal limits. The paranasal sinuses and mastoid air cells are well-aerated.  No significant soft tissue abnormalities are seen.  IMPRESSION: Unremarkable  noncontrast CT of the head.  Original Report Authenticated By: Tonia Ghent, M.D.    Medications: Scheduled Meds:   . docusate sodium  100 mg Oral BID  . haloperidol lactate  0.5 mg Intravenous Once  . insulin aspart  0-9 Units Subcutaneous TID WC  . insulin detemir  44 Units Subcutaneous QHS  . lactulose  20 g Per Tube TID  . lactulose  30 g Per Tube Q1H  . lactulose  30 g Per Tube Q1H  . levothyroxine  50 mcg Oral Q0700  . metoprolol succinate  12.5 mg Oral Daily  . mulitivitamin with minerals  1 tablet Oral Daily  . mupirocin cream   Topical BID  . nystatin  1 g Topical Daily  . pantoprazole  40 mg Oral Q1200  . rifaximin  550 mg Oral BID  . rOPINIRole  1 mg Oral QHS  . sodium chloride  3 mL Intravenous Q12H  . DISCONTD: lactulose  30 g Oral TID  . DISCONTD: lactulose  30 g Oral Q1H   Continuous Infusions:   . sodium chloride 100 mL/hr at 12/16/11 1125  . 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   Plan:   Altered mental status  appears possibly a combination of Hepatic encephalopathy with dehydration  also has AKI possibly prerenal in etiology. Also his lasix was increased on recent discharge  Will give IV fluids and also continue lactulose titrating to at least 3 BMs daily . Patient does not seem to have received lauctulose much yesterday and also did not have BMs . NG placed overnight  as he was much lethargic and this am  appears slightly improved. Wrist restrains applied and haldol given Cont rifaximin  follows with GI Dr Christella Hartigan and his team was informed about patient's admission.  Hyponatremia  Appears chronic and secondary to functional SIADH and underlying liver disease as per previous renal evaluation  Will monitor closely  on gentle IV fluids @75  cc for today. Appreciate renal recommendations.  Monitor chem in am. Na level improved.  recommended for fluid restriction to 1L on discharge per renal recently   AKI  possibly prerenal and patient appears dry as well   holding  lasix for now and give gentle hydration . Slowly improving will resume at lower dose once stable  monitor lytes in am   Cirrhosis with NASH  Cont lactulose  Elevated INR.   DM  cont lantus and SSI   CAD s/p stenting  Stable   Hypothyroidism  cont synthroid   Full code  Plan discussed with wife   LOS: 2 days   Orland Visconti 12/16/2011, 2:59 PM

## 2011-12-17 LAB — SODIUM, URINE, RANDOM: Sodium, Ur: 34 mEq/L

## 2011-12-17 LAB — RENAL FUNCTION PANEL
Albumin: 2 g/dL — ABNORMAL LOW (ref 3.5–5.2)
GFR calc Af Amer: >90 mL/min (ref >90)
GFR calc non Af Amer: 88 mL/min — ABNORMAL LOW (ref >90)
Phosphorus: 2.5 mg/dL (ref 2.3–4.6)
Potassium: 4.2 mEq/L (ref 3.5–5.1)
Sodium: 132 mEq/L — ABNORMAL LOW (ref 135–145)

## 2011-12-17 LAB — CBC
Platelets: 173 10*3/uL (ref 150–400)
RDW: 15.1 % (ref 11.5–15.5)
WBC: 6.3 10*3/uL (ref 4.0–10.5)

## 2011-12-17 LAB — GLUCOSE, CAPILLARY
Glucose-Capillary: 153 mg/dL — ABNORMAL HIGH (ref 70–99)
Glucose-Capillary: 238 mg/dL — ABNORMAL HIGH (ref 70–99)
Glucose-Capillary: 182 mg/dL — ABNORMAL HIGH (ref 70–99)

## 2011-12-17 LAB — PROTIME-INR: INR: 1.91 — ABNORMAL HIGH (ref 0.00–1.49)

## 2011-12-17 MED ORDER — PHYTONADIONE 5 MG PO TABS
5.0000 mg | ORAL_TABLET | Freq: Once | ORAL | Status: AC
  Administered 2011-12-17: 5 mg via ORAL
  Filled 2011-12-17: qty 1

## 2011-12-17 NOTE — Progress Notes (Signed)
Patient seen and examined and agree with assessment and plan as above. Serum Na+ corrected and volume depletion/creatinine improved with water restriction and holding diuretics/giving NS.  Pt was volume depleted.  Renal function back to normal.  Would decrease or hold diuretics at discharge and they can be increased as needed in outpt setting.  Will sign off.  Please call as needed.    Vinson Moselle, MD [employer] [phone] cell 12/17/2011, 1:05 PM

## 2011-12-17 NOTE — Progress Notes (Addendum)
FL-2 completed and will be made available to MD for his signature. Patient will return to [facility] when medically stable.  Genelle Bal, MSW, LCSW [phone]

## 2011-12-17 NOTE — Progress Notes (Signed)
Subjective: Patient seen and examined this am. Appears more awake and alert. Informs having 2 BMs yesterday.   Objective:  Vital signs in last 24 hours:  Filed Vitals:   12/16/11 2216 12/17/11 0300 12/17/11 0453 12/17/11 1421  BP: 109/74  102/69 135/72  Pulse: 101 56 92 98  Temp: 98.1 F (36.7 C) 98 F (36.7 C) 98 F (36.7 C) 98.3 F (36.8 C)  TempSrc: Oral Oral Oral Oral  Resp: 20 20 20 19   Height:      Weight:   109 kg (240 lb 4.8 oz)   SpO2: 98%  94% 98%    Intake/Output from previous day:   Intake/Output Summary (Last 24 hours) at 12/17/11 1536 Last data filed at 12/17/11 1421  Gross per 24 hour  Intake   2190 ml  Output    501 ml  Net   1689 ml    Physical Exam: General:elderly male in no acute distress.  HEENT: no pallor, no icterus, dry oral mucosa, no JVD, no lymphadenopathy , Heart: Normal s1 &s2 Regular rate and rhythm, without murmurs, rubs, gallops.  Lungs: Clear to auscultation bilaterally.  Abdomen: Soft, nontender, nondistended, positive bowel sounds.  Extremities: No clubbing cyanosis 1+ pedal edema b/l, positive pedal pulses.  Neuro: AAOX 2, some  flapping tremors    Lab Results:  Basic Metabolic Panel:    Component Value Date/Time   NA 132* 12/17/2011 0630   K 4.2 12/17/2011 0630   CL 99 12/17/2011 0630   CO2 24 12/17/2011 0630   BUN 39* 12/17/2011 0630   CREATININE 0.96 12/17/2011 0630   GLUCOSE 152* 12/17/2011 0630   CALCIUM 8.5 12/17/2011 0630   CBC:    Component Value Date/Time   WBC 6.3 12/17/2011 0630   HGB 11.0* 12/17/2011 0630   HCT 31.6* 12/17/2011 0630   PLT 173 12/17/2011 0630   MCV 89.0 12/17/2011 0630   NEUTROABS 7.9* 12/14/2011 1835   LYMPHSABS 1.1 12/14/2011 1835   MONOABS 0.5 12/14/2011 1835   EOSABS 0.0 12/14/2011 1835   BASOSABS 0.0 12/14/2011 1835    Recent Results (from the past 240 hour(s))  URINE CULTURE     Status: Normal   Collection Time   12/14/11  9:03 PM      Component Value Range Status Comment   Specimen  Description URINE, CATHETERIZED   Final    Special Requests NONE   Final    Setup Time [id number]   Final    Colony Count NO GROWTH   Final    Culture NO GROWTH   Final    Report Status 12/15/2011 FINAL   Final   MRSA PCR SCREENING     Status: Normal   Collection Time   12/15/11  3:44 AM      Component Value Range Status Comment   MRSA by PCR NEGATIVE  NEGATIVE  Final     Studies/Results: No results found.  Medications: Scheduled Meds:   . docusate sodium  100 mg Oral BID  . insulin aspart  0-9 Units Subcutaneous TID WC  . insulin detemir  44 Units Subcutaneous QHS  . lactulose  20 g Per Tube TID  . levothyroxine  50 mcg Oral Q0700  . metoprolol succinate  12.5 mg Oral Daily  . mulitivitamin with minerals  1 tablet Oral Daily  . mupirocin cream   Topical BID  . nystatin  1 g Topical Daily  . pantoprazole  40 mg Oral Q1200  . rifaximin  550 mg Oral  BID  . rOPINIRole  1 mg Oral QHS  . sodium chloride  3 mL Intravenous Q12H   Continuous Infusions:   . 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   Plan:  Altered mental status  appears possibly a combination of Hepatic encephalopathy with dehydration  also has AKI possibly prerenal in etiology. Also his lasix was increased on recent discharge  given IV fluids and also continue lactulose titrating to at least 3 BMs daily . Patient does not seem to have received lauctulose much  and also did not  have BMs . NG placed overnight on 1/17 as he was much lethargic and lactulose given.Wrist restrains applied and haldol given  Cont rifaximin  follows with GI Dr Christella Hartigan and his team was informed about patient's admission.   Hyponatremia  Appears chronic and secondary to functional SIADH and underlying liver disease as per previous renal evaluation  Given gentle IV fluids @75  cc for today. Appreciate renal recommendations. Na level now improved.  Will hold lasix for now and upon discharge can be started on a low dose.( was discharged recently on 80 mg bid). Follows with Dr Deterding  AKI  possibly prerenal and patient appears dry as well  holding lasix for now and given gentle hydration . Improved this am  monitor lytes in am   Cirrhosis with NASH  Cont lactulose  Elevated INR.  Will give 5 mg vit K  DM  cont lantus and SSI   CAD s/p stenting  Stable  Hypothyroidism  cont synthroid   Full code    dispo  Back to SNF once encephalopathy resolves completely    Consult: renal      LOS: 3 days   Marcianne Ozbun 12/17/2011, 3:36 PM

## 2011-12-17 NOTE — Progress Notes (Signed)
Subjective: Interval History: AMS secondary to hepatic encephalopathy now resolved after lactulose. Hyponatremia and hyperkalemia resolved, reconsulted due to AKI with elevated cr to 1.8, BUN 65 likely secondary to volume depletion. 500 CC UOP recorded, incontinent.  Net + 1869 in 24 hours.   Objective: Vital signs in last 24 hours: Temp:  [98 F (36.7 C)-98.8 F (37.1 C)] 98 F (36.7 C) (01/19 0453) Pulse Rate:  [56-101] 92  (01/19 0453) Resp:  [18-20] 20  (01/19 0453) BP: (102-130)/(69-79) 102/69 mmHg (01/19 0453) SpO2:  [94 %-98 %] 94 % (01/19 0453) Weight:  [240 lb 4.8 oz (109 kg)] 240 lb 4.8 oz (109 kg) (01/19 0453) Weight change: -1 lb 1.6 oz (-0.5 kg)  Intake/Output from previous day: 01/18 0701 - 01/19 0700 In: 2370 [P.O.:1110; I.V.:1200; NG/GT:60] Out: 501 [Urine:501] Intake/Output this shift:   Intake/Output Summary (Last 24 hours) at 12/17/11 0825 Last data filed at 12/17/11 9562  Gross per 24 hour  Intake   2310 ml  Output    501 ml  Net   1809 ml     Physical Exam General appearance: obese, pale. Fully oriented x3, alert and appropriate.  Resp: CTAB. No resp distress. Cardio: S1, S2 normal and systolic murmur: II/VI sys murmur at LLSB GI: abdomen soft, obese, nontender, nondistended. Extremities: Trace LE edema noted, moderate dependent edema in presacral area.  Lab Results:  Basename 12/17/11 0630 12/15/11 0548  WBC 6.3 10.0  HGB 11.0* 11.9*  HCT 31.6* 33.4*  PLT 173 241   BMET:   Basename 12/17/11 0630 12/16/11 0405  NA 132* 131*  K 4.2 4.4  CL 99 97  CO2 24 22  GLUCOSE 152* 225*  BUN 39* 62*  CREATININE 0.96 1.62*  CALCIUM 8.5 8.9   No results found for this basename: PTH:2 in the last 72 hours Iron Studies: No results found for this basename: IRON,TIBC,TRANSFERRIN,FERRITIN in the last 72 hours  Studies/Results: No results found.     . docusate sodium  100 mg Oral BID  . insulin aspart  0-9 Units Subcutaneous TID WC  . insulin  detemir  44 Units Subcutaneous QHS  . lactulose  20 g Per Tube TID  . levothyroxine  50 mcg Oral Q0700  . metoprolol succinate  12.5 mg Oral Daily  . mulitivitamin with minerals  1 tablet Oral Daily  . mupirocin cream   Topical BID  . nystatin  1 g Topical Daily  . pantoprazole  40 mg Oral Q1200  . rifaximin  550 mg Oral BID  . rOPINIRole  1 mg Oral QHS  . sodium chloride  3 mL Intravenous Q12H    Assessment/Plan: 1. Hepatic encephalopathy. Lactulose per primary team. Secondary to NASH, cirrhosis. 2. AKI. Baseline 1.1-1.2, Cr peaked at 1.8 and has improved with gentle hydration,difficult to assess ins/outs due to incontinence. This is a resolved prerenal insult. Do not see any history of diuretics in past 3 days, though fluid restriction and AMS likely contributed to poor oral intake. Would continue saline lock in setting of hx heart failure. Would consider restarting home lasix or reduced dose (on 80mg  BID at home) as patient increases his fluid intake now that AMS is resolved. Would continue previous fluid restriction guidelines also. 3. Hyponatremia secondary to SIADH, cirrhosis, and fluid restriction. Improved with fluid. 4. Hyperkalemia resolved: avoid spironolactone  5. DM-per primary team, control appears good A1c 6.2 6. Dispo. Per primary, renal function to baseline will need to restart lasix at least half dose prior  to DC with close f/u volume status.     LOS: 3 days   [name] 12/17/2011,8:25 AM

## 2011-12-18 LAB — BASIC METABOLIC PANEL
BUN: 26 mg/dL — ABNORMAL HIGH (ref 6–23)
CO2: 25 mEq/L (ref 19–32)
Chloride: 100 mEq/L (ref 96–112)
Creatinine, Ser: 0.9 mg/dL (ref 0.50–1.35)
GFR calc Af Amer: >90 mL/min (ref >90)
Potassium: 4 mEq/L (ref 3.5–5.1)

## 2011-12-18 LAB — GLUCOSE, CAPILLARY
Glucose-Capillary: 244 mg/dL — ABNORMAL HIGH (ref 70–99)
Glucose-Capillary: 211 mg/dL — ABNORMAL HIGH (ref 70–99)

## 2011-12-18 MED ORDER — LACTULOSE 10 GM/15ML PO SOLN
30.0000 g | Freq: Three times a day (TID) | ORAL | Status: DC
  Administered 2011-12-18 – 2011-12-19 (×4): 30 g via ORAL
  Filled 2011-12-18 (×6): qty 45

## 2011-12-18 NOTE — Progress Notes (Signed)
Subjective: Awake alert!! Anxious to be discharged back to skilled nursing facility tomorrow  Objective:  Vital signs in last 24 hours:  Filed Vitals:   12/17/11 1421 12/17/11 2151 12/18/11 0402 12/18/11 1526  BP: 135/72 110/72 113/76 105/70  Pulse: 98 89 92 92  Temp: 98.3 F (36.8 C) 98.5 F (36.9 C) 97.7 F (36.5 C) 98.1 F (36.7 C)  TempSrc: Oral Oral Oral Oral  Resp: 19 20 18 19   Height:      Weight:   109.1 kg (240 lb 8.4 oz)   SpO2: 98% 99% 100% 97%    Intake/Output from previous day:   Intake/Output Summary (Last 24 hours) at 12/18/11 1750 Last data filed at 12/18/11 1527  Gross per 24 hour  Intake   1240 ml  Output    850 ml  Net    390 ml    Physical Exam: General:elderly male in no acute distress. Completely awake and alert today. HEENT: no pallor, no icterus, dry oral mucosa, no JVD, no lymphadenopathy , Heart: Normal s1 &s2 Regular rate and rhythm, without murmurs, rubs, gallops.  Lungs: Clear to auscultation bilaterally.  Abdomen: Soft, nontender, nondistended, positive bowel sounds.  Extremities: No clubbing cyanosis 1+ pedal edema b/l, positive pedal pulses.  Neuro: AAOX 2, some  flapping tremors    Lab Results:  Basic Metabolic Panel:    Component Value Date/Time   NA 131* 12/18/2011 0500   K 4.0 12/18/2011 0500   CL 100 12/18/2011 0500   CO2 25 12/18/2011 0500   BUN 26* 12/18/2011 0500   CREATININE 0.90 12/18/2011 0500   GLUCOSE 195* 12/18/2011 0500   CALCIUM 8.8 12/18/2011 0500   CBC:    Component Value Date/Time   WBC 6.3 12/17/2011 0630   HGB 11.0* 12/17/2011 0630   HCT 31.6* 12/17/2011 0630   PLT 173 12/17/2011 0630   MCV 89.0 12/17/2011 0630   NEUTROABS 7.9* 12/14/2011 1835   LYMPHSABS 1.1 12/14/2011 1835   MONOABS 0.5 12/14/2011 1835   EOSABS 0.0 12/14/2011 1835   BASOSABS 0.0 12/14/2011 1835    Recent Results (from the past 240 hour(s))  URINE CULTURE     Status: Normal   Collection Time   12/14/11  9:03 PM      Component Value Range  Status Comment   Specimen Description URINE, CATHETERIZED   Final    Special Requests NONE   Final    Setup Time [id number]   Final    Colony Count NO GROWTH   Final    Culture NO GROWTH   Final    Report Status 12/15/2011 FINAL   Final   MRSA PCR SCREENING     Status: Normal   Collection Time   12/15/11  3:44 AM      Component Value Range Status Comment   MRSA by PCR NEGATIVE  NEGATIVE  Final   URINE CULTURE     Status: Normal   Collection Time   12/16/11 11:46 PM      Component Value Range Status Comment   Specimen Description URINE, CLEAN CATCH   Final    Special Requests NONE   Final    Setup Time [id number]   Final    Colony Count 15,000 COLONIES/ML   Final    Culture     Final    Value: Multiple bacterial morphotypes present, none predominant. Suggest appropriate recollection if clinically indicated.   Report Status 12/18/2011 FINAL   Final     Studies/Results: No results  found.  Medications: Scheduled Meds:    . docusate sodium  100 mg Oral BID  . insulin aspart  0-9 Units Subcutaneous TID WC  . insulin detemir  44 Units Subcutaneous QHS  . lactulose  30 g Oral TID  . levothyroxine  50 mcg Oral Q0700  . metoprolol succinate  12.5 mg Oral Daily  . mulitivitamin with minerals  1 tablet Oral Daily  . mupirocin cream   Topical BID  . nystatin  1 g Topical Daily  . pantoprazole  40 mg Oral Q1200  . phytonadione  5 mg Oral Once  . rifaximin  550 mg Oral BID  . rOPINIRole  1 mg Oral QHS  . sodium chloride  3 mL Intravenous Q12H  . 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   Plan:  Altered mental status  Secondary to hepatic encephalopathy-now resolved-with patient being completely awake and alert -Change lactulose to 45 cc 3 times a day -Continue with rifaximin  Hyponatremia  Significantly improved, some amount of chronic hyponatremia secondary to liver cirrhosis  AKI  -Resolved, possibly prerenal holding lasix for now  Resume low-dose Lasix at discharge. Aldactone discontinued last admission because of hyperkalemia and worsening renal insufficiency-so will not rechallenge  Cirrhosis with NASH  Cont lactulose  Chronically Elevated INR-no signs of bleeding, status post vitamin K injection.  DM  -CBGs moderately controlled cont levemir and SSI   CAD s/p stenting  Stable  -Resume aspirin on discharge -Continue with metoprolol  Hypothyroidism  cont synthroid   Full code   dispo  Back to SNF tomorrow   LOS: 4 days   [facility] 12/18/2011, 5:50 PM

## 2011-12-18 NOTE — Progress Notes (Signed)
Physical Therapy Evaluation Patient Details Name: [name] MRN: [id number] DOB: [birth year] Today's Date: 12/18/2011  Problem List:  Patient Active Problem List  Diagnoses  . AODM  . OBSTRUCTIVE SLEEP APNEA  . Essential hypertension, benign  . Cirrhosis  . Coronary atherosclerosis of native coronary artery  . Hyperlipidemia  . Morbid obesity  . Arthritis pain of hip  . CRI (chronic renal insufficiency)  . NAFLD (nonalcoholic fatty liver disease)  . Anemia  . Hepatic encephalopathy  . Enterococcus UTI  . RLS (restless legs syndrome)  . Reflux  . Diabetes mellitus  . ARF (acute renal failure)  . E. coli UTI  . Hyponatremia  . Hypothyroidism  . FTT (failure to thrive) in adult  . Hyperkalemia  . Altered mental status    Past Medical History:  Past Medical History  Diagnosis Date  . Hypertension   . Type II or unspecified type diabetes mellitus without mention of complication, not stated as uncontrolled   . Thyroid disease   . Cirrhosis, non-alcoholic   . Morbid obesity   . Pancytopenia   . Fatty liver   . Coronary artery disease   . Myocardial infarction     "HEART INCIDENT " OCT 2012  . Angina     RARE USE OF NTG  . CHF (congestive heart failure)     JUNE 2012  . Arthritis   . Gout 35 YRS AGO  . Cellulitis of left leg   . Cirrhosis of liver     NONALCOHOLIC  . Nocturia   . Elevated BUN   . Elevated serum creatinine   . Serum ammonia increased   . Splenomegaly   . Platelets decreased   . Thyroid disorder     PT DOES NOT KNOW IF LOW OR HIGH  . Chronic kidney disease (CKD), stage III (moderate)   . Obstructive sleep apnea   . Hyponatremia   . Hyperkalemia   . Heart murmur    Past Surgical History:  Past Surgical History  Procedure Date  . Coronary stent placement     2 CARDIAC STENTS OCT 08/2011  . Total hip arthroplasty 10/14/2011    Procedure: TOTAL HIP ARTHROPLASTY ANTERIOR APPROACH;  Surgeon: Kathryne Hitch;  Location: [facility];   Service: Orthopedics;  Laterality: Right;  . Joint replacement   . Appendectomy 1968  . Tonsillectomy 1957  . Cardiac catheterization 09/08/2011    PT Assessment/Plan/Recommendation PT Assessment Clinical Impression Statement: Patient is a 61 yo male admitted with AMS - hepatic encephalopathy.  Recommend patient return to SNF [facility]) for continued therapy at discharge, with ultimate goal to return home. PT Recommendation/Assessment: Patient will need skilled PT in the acute care venue PT Problem List: Decreased strength;Decreased activity tolerance;Decreased balance;Decreased mobility;Decreased coordination;Decreased knowledge of use of DME PT Therapy Diagnosis : Difficulty walking;Generalized weakness PT Plan PT Frequency: Min 3X/week PT Treatment/Interventions: DME instruction;Gait training;Functional mobility training;Therapeutic activities;Balance training;Therapeutic exercise;Patient/family education PT Recommendation Follow Up Recommendations: Skilled nursing facility Equipment Recommended: Defer to next venue PT Goals  Acute Rehab PT Goals PT Goal Formulation: With patient/family Time For Goal Achievement: 7 days Pt will go Supine/Side to Sit: with modified independence;with HOB 0 degrees;with rail PT Goal: Supine/Side to Sit - Progress: Goal set today Pt will go Sit to Supine/Side: with modified independence;with rail;with HOB 0 degrees PT Goal: Sit to Supine/Side - Progress: Goal set today Pt will go Sit to Stand: with min assist;with upper extremity assist PT Goal: Sit to Stand - Progress: Goal  set today Pt will go Stand to Sit: with min assist;with upper extremity assist PT Goal: Stand to Sit - Progress: Goal set today Pt will Transfer Bed to Chair/Chair to Bed: with min assist PT Transfer Goal: Bed to Chair/Chair to Bed - Progress: Goal set today Pt will Ambulate: 51 - 150 feet;with supervision;with rolling walker PT Goal: Ambulate - Progress: Goal set today  PT  Evaluation Precautions/Restrictions  Precautions Precautions: Fall Required Braces or Orthoses: No Restrictions Weight Bearing Restrictions: No Prior Functioning  Home Living Lives With: Spouse Receives Help From: Family Type of Home: House Home Layout: One level Home Access: Stairs to enter Entrance Stairs-Rails: Right Entrance Stairs-Number of Steps: 3 Bathroom Shower/Tub: Tub/shower unit (sink baths) Bathroom Toilet: Standard Home Adaptive Equipment: Dan Humphreys - four wheeled;Straight cane;Bedside commode/3-in-1 (trapeze) Prior Function Level of Independence: Independent with basic ADLs;Requires assistive device for independence;Needs assistance with homemaking Driving: No (Not since June) Cognition Cognition Arousal/Alertness: Awake/alert Overall Cognitive Status: Appears within functional limits for tasks assessed Orientation Level: Oriented X4 Sensation/Coordination Sensation Light Touch: Appears Intact Coordination Gross Motor Movements are Fluid and Coordinated: No Coordination and Movement Description: Decreased coordination right LE (s/p right THA) Extremity Assessment RUE Assessment RUE Assessment: Within Functional Limits LUE Assessment LUE Assessment: Within Functional Limits RLE Assessment RLE Assessment: Exceptions to [facility] (Decreased hip range; strength grossly 4-/5) LLE Assessment LLE Assessment: Within Functional Limits Mobility (including Balance) Bed Mobility Bed Mobility: Yes Rolling Right: 6: Modified independent (Device/Increase time);With rail Rolling Left: 6: Modified independent (Device/Increase time);With rail Left Sidelying to Sit: 5: Supervision;With rails;HOB elevated (comment degrees) (HOB at 30 degrees) Left Sidelying to Sit Details (indicate cue type and reason): Cues for technique Sit to Supine: 4: Min assist;HOB flat Sit to Supine - Details (indicate cue type and reason): Assist to raise LE's on to bed.  Patient able to control/manage  trunk descent to bed Transfers Transfers: Yes Sit to Stand: 3: Mod assist;From elevated surface;With upper extremity assist;With armrests;From bed;From chair/3-in-1;2: Max assist Sit to Stand Details (indicate cue type and reason): Mod assist from elevated bed; max assist from lower recliner; verbal cues for hand placement and technique Stand to Sit: 4: Min assist;With upper extremity assist;With armrests;To bed;To chair/3-in-1 Stand to Sit Details: Cues for safe hand placement and technique; Assist to control descent to lower recliner Ambulation/Gait Ambulation/Gait: Yes Ambulation/Gait Assistance: 4: Min assist Ambulation/Gait Assistance Details (indicate cue type and reason): Cues to stand upright and look forward during gait.  Cues to keep RW closer to body. Ambulation Distance (Feet): 46 Feet Assistive device: Rolling walker Gait Pattern: Step-through pattern;Decreased stride length;Trunk flexed Gait velocity: Very slow gait speed  Posture/Postural Control Posture/Postural Control: Postural limitations Postural Limitations: Difficulty flexing trunk forward for sit to stand due to right hip surgery Exercise    End of Session PT - End of Session Equipment Utilized During Treatment: Gait belt Activity Tolerance: Patient limited by fatigue Patient left: in bed;with call bell in reach;with family/visitor present Nurse Communication: Mobility status for transfers General Behavior During Session: [facility] for tasks performed Cognition: [facility] for tasks performed  Vena Austria [phone] 12/18/2011, 4:54 PM

## 2011-12-19 LAB — BASIC METABOLIC PANEL
CO2: 22 mEq/L (ref 19–32)
CO2: 21 mEq/L (ref 19–32)
Calcium: UNDETERMINED mg/dL (ref 8.4–10.5)
Chloride: 101 mEq/L (ref 96–112)
Creatinine, Ser: 0.8 mg/dL (ref 0.50–1.35)
GFR calc non Af Amer: >90 mL/min (ref >90)
Glucose, Bld: 142 mg/dL — ABNORMAL HIGH (ref 70–99)
Glucose, Bld: 137 mg/dL — ABNORMAL HIGH (ref 70–99)
Sodium: 132 mEq/L — ABNORMAL LOW (ref 135–145)
Sodium: 132 mEq/L — ABNORMAL LOW (ref 135–145)

## 2011-12-19 LAB — PROTIME-INR: INR: 1.86 — ABNORMAL HIGH (ref 0.00–1.49)

## 2011-12-19 LAB — GLUCOSE, CAPILLARY: Glucose-Capillary: 151 mg/dL — ABNORMAL HIGH (ref 70–99)

## 2011-12-19 MED ORDER — FUROSEMIDE 80 MG PO TABS: 40.0000 mg | ORAL_TABLET | Freq: Two times a day (BID) | ORAL | Status: DC

## 2011-12-19 MED ORDER — OXYCODONE HCL 10 MG PO TB12: 10.0000 mg | ORAL_TABLET | Freq: Four times a day (QID) | ORAL | Status: DC | PRN

## 2011-12-19 NOTE — Progress Notes (Signed)
Pt d/c per stretcher per ambulance to [facility]. Pt is  alert says he will call his wife from [place] when he arrives. Marisa Cyphers RN

## 2011-12-19 NOTE — Progress Notes (Signed)
[facility] d/c'd. Pt dressed for discharge per his request. Refused diaper that his wife had brought. All belongings with pt including cell phone and charger

## 2011-12-19 NOTE — Progress Notes (Signed)
Clinical Social Work-CSW facilitating pt d/c back to [place] with chart copy. D/C summary faxed and CSW arranging [facility] transport-no further needs. Jodean Lima, [phone]

## 2011-12-19 NOTE — Discharge Summary (Signed)
PATIENT DETAILS Name: [name] Age: 61 y.o. Sex: male Date of Birth: 1951-07-23 MRN: [id number]. 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 Hypertension   . Type II or unspecified type diabetes mellitus without mention of complication, not stated as uncontrolled   . Thyroid disease   . Cirrhosis, non-alcoholic   . Morbid obesity   . Pancytopenia   . Fatty liver   . Coronary artery disease   . Myocardial infarction     "HEART INCIDENT " OCT 2012  . Angina     RARE USE OF NTG  . CHF (congestive heart failure)     JUNE 2012  . Arthritis   . Gout 35 YRS AGO  . Cellulitis of left leg   . Cirrhosis of liver     NONALCOHOLIC  . Nocturia   . Elevated BUN   . Elevated serum creatinine   . Serum ammonia increased   . Splenomegaly   . Platelets decreased   . Thyroid disorder     PT DOES NOT KNOW IF LOW OR HIGH  . Chronic kidney disease (CKD), stage III (moderate)   . Obstructive sleep apnea   . Hyponatremia   . Hyperkalemia   . Heart murmur     DISCHARGE MEDICATIONS: Current Discharge Medication List    CONTINUE these medications which have CHANGED   Details  furosemide (LASIX) 80 MG tablet Take 0.5 tablets (40 mg total) by mouth 2 (two) times daily.    oxyCODONE (OXYCONTIN) 10 MG 12 hr tablet Take 1 tablet (10 mg total) by mouth every 6 (six) hours as needed for pain. For pain Qty: 10 tablet, Refills: 0      CONTINUE these medications which have NOT CHANGED   Details  aspirin 81 MG tablet Take 81 mg by mouth every morning.     docusate sodium (COLACE) 100 MG capsule Take 100 mg by mouth 2 (two) times daily.        insulin aspart (NOVOLOG) 100 UNIT/ML injection Inject 6 Units into the skin 3 (three) times daily with meals. As directed per sliding scale    insulin detemir (LEVEMIR) 100 UNIT/ML injection Inject 44 Units into the skin at bedtime.    lactulose (CHRONULAC) 10 GM/15ML solution Take 30 g by mouth 3 (three) times daily.      levothyroxine (SYNTHROID, LEVOTHROID) 50 MCG tablet Take 50 mcg by mouth every morning.     lidocaine (LIDODERM) 5 % Place 1 patch onto the skin daily as needed. Remove & Discard patch within 12 hours or as directed by MD..PAIN    metoprolol succinate (TOPROL-XL) 25 MG 24 hr tablet 12.5 mg every morning.     Multiple Vitamin (MULITIVITAMIN WITH MINERALS) TABS Take 1 tablet by mouth daily.    nitroGLYCERIN (NITROSTAT) 0.4 MG SL tablet Place 0.4 mg under the tongue every 5 (five) minutes as needed. CHEST PAIN    nystatin (NYSTOP) 100000 UNIT/GM POWD Apply 1 g topically daily. topically    pantoprazole (PROTONIX) 40 MG tablet Take 40 mg by mouth daily at 12 noon.      rifaximin (XIFAXAN) 550 MG TABS Take 550 mg by mouth 2 (two) times daily.  rOPINIRole (REQUIP) 1 MG tablet Take 1 mg by mouth at bedtime.      STOP taking these medications     oxyCODONE 10 MG TABS          BRIEF HPI:  See H&P, Labs, Consult and Test reports for all details in brief, patient was admitted for confusion.At the nursing home patient was found to be increasingly confused and not finding words to talk. He was brought to ER. His creatinine was found 1.7 with a sodium of 127. Patient is afebrile. Did not have any nausea vomiting or abdominal pain or diarrhea as per patient's wife. Patient's ammonia level is within normal limits on admission.   CONSULTATIONS:   nephrology  PERTINENT RADIOLOGIC STUDIES: Dg Chest 2 View  12/14/2011  *RADIOLOGY REPORT*  Clinical Data: Chest pain, confusion, short of breath  CHEST - 2 VIEW  Comparison: 10/16/2011  Findings: Heart size there is a  mildly enlarged.  Negative for heart failure.  Lungs are clear.  Negative for pneumonia or effusion.  IMPRESSION: No active cardiopulmonary disease.  Original Report Authenticated By: Camelia Phenes, M.D.   Dg Lumbar Spine 2-3 Views  11/20/2011  *RADIOLOGY REPORT*  Clinical Data: Low back pain for 1 week.  LUMBAR SPINE - 2-3 VIEW  Comparison: CT abdomen and pelvis 05/28/2011 reviewed.  Findings: The patient has a superior endplate compression fracture of L5 which is new since the comparison examination.  Vertebral body height loss is estimated at 50%.  Vertebral body height is otherwise maintained.  Alignment is normal. Facet degenerative disease in the lower lumbar spine is noted.  Right total hip replacement is noted.  IMPRESSION: L5 superior endplate compression fracture is new since exam 05/28/2011 although its exact age cannot be determined. Per CMS PQRS reporting requirements (PQRS Measure 24): Given the patient's age of greater than 50 and the fracture site (hip, distal radius, or spine), the patient should be tested for osteoporosis using DXA, and the appropriate treatment considered based on the DXA results.  Original Report Authenticated By: Bernadene Bell. Maricela Curet, M.D.   Ct Head Wo Contrast  12/14/2011  *RADIOLOGY REPORT*  Clinical Data: Altered mental status.  CT HEAD WITHOUT CONTRAST  Technique:  Contiguous axial images were obtained from the base of the skull through the vertex without contrast.  Comparison: CT of the head performed 12/03/2011  Findings: There is no evidence of acute infarction, mass lesion, or intra- or extra-axial hemorrhage on CT.  The posterior fossa, including the cerebellum, brainstem and fourth ventricle, is within normal limits.  The third and lateral ventricles, and basal ganglia are unremarkable in appearance.  The cerebral hemispheres are symmetric in appearance, with normal gray- white differentiation.  No mass effect or midline shift is seen.  There is no evidence of  fracture; visualized osseous structures are unremarkable in appearance.  The orbits are within normal limits. The paranasal sinuses and mastoid air cells are well-aerated.  No significant soft tissue abnormalities are seen.  IMPRESSION: Unremarkable noncontrast CT of the head.  Original Report Authenticated By: Tonia Ghent, M.D.   Ct Head Wo Contrast  12/03/2011  *RADIOLOGY REPORT*  Clinical Data: Altered mental status  CT HEAD WITHOUT CONTRAST  Technique:  Contiguous axial images were obtained from the base of the skull through the vertex without contrast.  Comparison: [place] CT head dated 08/28/2011  Findings: No evidence of parenchymal hemorrhage or extra-axial fluid collection. No mass lesion, mass effect, or midline shift.  No CT evidence of  acute infarction.  Cerebral volume is age appropriate.  No ventriculomegaly.  The visualized paranasal sinuses are essentially clear. The mastoid air cells are unopacified.  No evidence of calvarial fracture.  IMPRESSION: No evidence of acute intracranial abnormality.  Original Report Authenticated By: Charline Bills, M.D.     PERTINENT LAB RESULTS: CBC:  Basename 12/17/11 0630  WBC 6.3  HGB 11.0*  HCT 31.6*  PLT 173   CMET CMP     Component Value Date/Time   NA 132* 12/19/2011 0445   NA 132* 12/19/2011 0445   K SPECIMEN/CONTAINER TYPE INAPPROPRIATE FOR ORDERED TEST, UNABLE TO PERFORM 12/19/2011 0445   K 4.5 12/19/2011 0445   CL 97 12/19/2011 0445   CL 101 12/19/2011 0445   CO2 22 12/19/2011 0445   CO2 21 12/19/2011 0445   GLUCOSE 142* 12/19/2011 0445   GLUCOSE 137* 12/19/2011 0445   BUN 22 12/19/2011 0445   BUN 23 12/19/2011 0445   CREATININE 0.80 12/19/2011 0445   CREATININE 0.81 12/19/2011 0445   CALCIUM SPECIMEN/CONTAINER TYPE INAPPROPRIATE FOR ORDERED TEST, UNABLE TO PERFORM 12/19/2011 0445   CALCIUM 8.5 12/19/2011 0445   PROT 7.0 12/15/2011 0548   ALBUMIN 2.0* 12/17/2011 0630   AST 19 12/15/2011 0548   ALT 16 12/15/2011 0548   ALKPHOS 141*  12/15/2011 0548   BILITOT 2.3* 12/15/2011 0548   GFRNONAA >90 12/19/2011 0445   GFRNONAA >90 12/19/2011 0445   GFRAA >90 12/19/2011 0445   GFRAA >90 12/19/2011 0445    GFR Estimated Creatinine Clearance: 118.2 ml/min (by C-G formula based on Cr of 0.8). No results found for this basename: LIPASE:2,AMYLASE:2 in the last 72 hours No results found for this basename: CKTOTAL:3,CKMB:3,CKMBINDEX:3,TROPONINI:3 in the last 72 hours No components found with this basename: POCBNP:3 No results found for this basename: DDIMER:2 in the last 72 hours No results found for this basename: HGBA1C:2 in the last 72 hours No results found for this basename: CHOL:2,HDL:2,LDLCALC:2,TRIG:2,CHOLHDL:2,LDLDIRECT:2 in the last 72 hours No results found for this basename: TSH,T4TOTAL,FREET3,T3FREE,THYROIDAB in the last 72 hours No results found for this basename: VITAMINB12:2,FOLATE:2,FERRITIN:2,TIBC:2,IRON:2,RETICCTPCT:2 in the last 72 hours Coags:  Basename 12/19/11 0445 12/18/11 0500  INR 1.86* 1.93*   Microbiology: Recent Results (from the past 240 hour(s))  URINE CULTURE     Status: Normal   Collection Time   12/14/11  9:03 PM      Component Value Range Status Comment   Specimen Description URINE, CATHETERIZED   Final    Special Requests NONE   Final    Setup Time [id number]   Final    Colony Count NO GROWTH   Final    Culture NO GROWTH   Final    Report Status 12/15/2011 FINAL   Final   MRSA PCR SCREENING     Status: Normal   Collection Time   12/15/11  3:44 AM      Component Value Range Status Comment   MRSA by PCR NEGATIVE  NEGATIVE  Final   URINE CULTURE     Status: Normal   Collection Time   12/16/11 11:46 PM      Component Value Range Status Comment   Specimen Description URINE, CLEAN CATCH   Final    Special Requests NONE   Final    Setup Time [id number]   Final    Colony Count 15,000 COLONIES/ML   Final    Culture     Final    Value: Multiple bacterial morphotypes present, none  predominant. Suggest appropriate recollection  if clinically indicated.   Report Status 12/18/2011 FINAL   Final      BRIEF HOSPITAL COURSE:   Principal Problem:  *Altered mental status -appears possibly a combination of Hepatic encephalopathy with dehydration  -Because of confusion, NGT was placed, ammonia was elevated at one point to 253, Lactulose was then given via NGT, Rifaximin was continued. With pt having Bowel Movement, he quickly improved, Ammonia is now back to normal. NGT has been removed, patient is awake and alert, and is considered stable to go back to his SNF. He is to continue on Lactulose and Rifaximin.  Active Problems:  Hyponatremia  Significantly improved with hydration, some amount of chronic hyponatremia secondary to liver cirrhosis   AKI  -Resolved, possibly prerenal  holding lasix for now  Resume 40 mg BID, previously was on 80 mg BID.  Aldactone discontinued last admission because of hyperkalemia and worsening renal insufficiency-so will not rechallenge  Cirrhosis with NASH  Cont lactulose and rifaximin Chronically Elevated INR-no signs of bleeding, status post vitamin K injection.  DM  -CBGs moderately controlled  cont levemir and novolog   CAD s/p stenting  Stable  -Resume aspirin -Continue with metoprolol  Please provide patient with Bedside commode at the SNF   TODAY-DAY OF DISCHARGE:  Subjective:   Tydarius Yawn today has no headache,no chest abdominal pain,no new weakness tingling or numbness, feels much better wants to go home today.  Objective:   Blood pressure 97/66, pulse 91, temperature 98.5 F (36.9 C), temperature source Oral, resp. rate 18, height 5\' 9"  (1.753 m), weight 109.408 kg (241 lb 3.2 oz), SpO2 98.00%.  Intake/Output Summary (Last 24 hours) at 12/19/11 1015 Last data filed at 12/19/11 0904  Gross per 24 hour  Intake   1300 ml  Output    603 ml  Net    697 ml    Exam Awake Alert, Oriented *3, No new F.N deficits,  Normal affect [place].AT,PERRAL Supple Neck,No JVD, No cervical lymphadenopathy appriciated.  Symmetrical Chest wall movement, Good air movement bilaterally, CTAB RRR,No Gallops,Rubs or new Murmurs, No Parasternal Heave +ve B.Sounds, Abd Soft, Non tender, No organomegaly appriciated, No rebound -guarding or rigidity. No Cyanosis, Clubbing or edema, No new Rash or bruise  DISPOSITION:   DISCHARGE INSTRUCTIONS:    Follow-up Information    Follow up with REED,TIFFANY L., DO in  1 week Follow up with Dr Rob Bunting in 1 week ([facility])        Total Time spent on discharge equals 45 minutes.  SignedJeoffrey Massed 12/19/2011 10:15 AM

## 2011-12-20 LAB — GLUCOSE, CAPILLARY: Glucose-Capillary: 124 mg/dL — ABNORMAL HIGH (ref 70–99)

## 2011-12-23 ENCOUNTER — Telehealth: Payer: Self-pay | Admitting: Cardiovascular Disease

## 2011-12-27 ENCOUNTER — Ambulatory Visit (INDEPENDENT_AMBULATORY_CARE_PROVIDER_SITE_OTHER): Payer: 59 | Admitting: Cardiovascular Disease

## 2011-12-27 ENCOUNTER — Encounter: Payer: Self-pay | Admitting: Cardiovascular Disease

## 2011-12-27 VITALS — BP 120/78 | HR 80 | Ht 69.0 in | Wt 251.8 lb

## 2011-12-27 DIAGNOSIS — E785 Hyperlipidemia, unspecified: Secondary | ICD-10-CM

## 2011-12-27 DIAGNOSIS — I2510 Atherosclerotic heart disease of native coronary artery without angina pectoris: Secondary | ICD-10-CM

## 2011-12-27 NOTE — Progress Notes (Signed)
HPI: this is a 61 year old gentleman presented for followup evaluation. The patient has coronary artery disease and presented last year with non-ST elevation infarction in the setting of medical illness. He had a high risk nuclear stress test and underwent cardiac catheterization showing multivessel disease. The patient has a complex medical history including hepatic cirrhosis, morbid obesity, with coagulopathy and recurrent encephalopathy related to cirrhosis. He was treated with bare-metal stenting of the LAD and right coronary arteries. He has had no recurrent ischemic events. He tolerated a short course of dual antiplatelet therapy with aspirin and Plavix.  The patient denies chest pain or dyspnea. He has chronic edema, unchanged over time. He denies orthopnea, PND, or palpitations. He is physically weak and has not engaged in exercise. He is not a candidate for statin therapy because of his liver disease.  Outpatient Encounter Prescriptions as of 12/27/2011  Medication Sig Dispense Refill  . aspirin 81 MG tablet Take 81 mg by mouth every morning.       . docusate sodium (COLACE) 100 MG capsule Take 100 mg by mouth 2 (two) times daily.        . furosemide (LASIX) 80 MG tablet Take 0.5 tablets (40 mg total) by mouth 2 (two) times daily.      . insulin aspart (NOVOLOG) 100 UNIT/ML injection Inject 6 Units into the skin 3 (three) times daily with meals. As directed per sliding scale      . insulin detemir (LEVEMIR) 100 UNIT/ML injection Inject 44 Units into the skin at bedtime.      Marland Kitchen lactulose (CHRONULAC) 10 GM/15ML solution Take 30 g by mouth 3 (three) times daily.        Marland Kitchen levothyroxine (SYNTHROID, LEVOTHROID) 50 MCG tablet Take 50 mcg by mouth every morning.       . metoprolol succinate (TOPROL-XL) 25 MG 24 hr tablet 12.5 mg every morning.       . Multiple Vitamin (MULITIVITAMIN WITH MINERALS) TABS Take 1 tablet by mouth daily.      . nitroGLYCERIN (NITROSTAT) 0.4 MG SL tablet Place 0.4 mg under  the tongue every 5 (five) minutes as needed. CHEST PAIN      . nystatin (NYSTOP) 100000 UNIT/GM POWD Apply 1 g topically daily. topically      . oxyCODONE (OXYCONTIN) 10 MG 12 hr tablet Take 1 tablet (10 mg total) by mouth every 6 (six) hours as needed for pain. For pain  10 tablet  0  . pantoprazole (PROTONIX) 40 MG tablet Take 40 mg by mouth daily at 12 noon.        . rifaximin (XIFAXAN) 550 MG TABS Take 550 mg by mouth 2 (two) times daily.      Marland Kitchen DISCONTD: lidocaine (LIDODERM) 5 % Place 1 patch onto the skin daily as needed. Remove & Discard patch within 12 hours or as directed by MD..PAIN      . DISCONTD: rOPINIRole (REQUIP) 1 MG tablet Take 1 mg by mouth at bedtime.        Allergies  Allergen Reactions  . Crestor (Rosuvastatin Calcium)     unknown  . Statins Other (See Comments)    PT DOES NOT REMEMBER    Past Medical History  Diagnosis Date  . Hypertension   . Type II or unspecified type diabetes mellitus without mention of complication, not stated as uncontrolled   . Thyroid disease   . Cirrhosis, non-alcoholic   . Morbid obesity   . Pancytopenia   . Fatty liver   .  Coronary artery disease   . Myocardial infarction     "HEART INCIDENT " OCT 2012  . Angina     RARE USE OF NTG  . CHF (congestive heart failure)     JUNE 2012  . Arthritis   . Gout 35 YRS AGO  . Cellulitis of left leg   . Cirrhosis of liver     NONALCOHOLIC  . Nocturia   . Elevated BUN   . Elevated serum creatinine   . Serum ammonia increased   . Splenomegaly   . Platelets decreased   . Thyroid disorder     PT DOES NOT KNOW IF LOW OR HIGH  . Chronic kidney disease (CKD), stage III (moderate)   . Obstructive sleep apnea   . Hyponatremia   . Hyperkalemia   . Heart murmur     ROS: Negative except as per HPI  BP 120/78  Pulse 80  Ht 5\' 9"  (1.753 m)  Wt 114.216 kg (251 lb 12.8 oz)  BMI 37.18 kg/m2  PHYSICAL EXAM: Pt is alert and oriented, very pleasant obese gentleman in NAD HEENT:  normal Neck: JVP - normal, carotids 2+= without bruits Lungs: CTA bilaterally CV: RRR with grade 3/6 systolic murmur at the right upper sternal border. Abd: soft, NT, Positive BS, no hepatomegaly Ext: 1+ bilateral pretibial edema, distal pulses intact and equal Skin: warm/dry no rash  EKG:  Most recent tracing December 09, 2011: Normal sinus rhythm with septal infarct age undetermined  ASSESSMENT AND PLAN:

## 2011-12-27 NOTE — Patient Instructions (Signed)
Your physician has requested that you have a lexiscan myoview in 3 MONTHS. For further information please visit [email or web address] Please follow instruction sheet, as given.  Your physician recommends that you continue on your current medications as directed. Please refer to the Current Medication list given to you today.  Your physician recommends that you schedule a follow-up appointment in: 3 MONTHS

## 2012-01-04 ENCOUNTER — Observation Stay (HOSPITAL_COMMUNITY)
Admission: EM | Admit: 2012-01-04 | Discharge: 2012-01-06 | Disposition: A | Discharge status: Skilled Nursing Facility | Payer: 59 | Attending: Family Medicine | Admitting: Family Medicine

## 2012-01-04 ENCOUNTER — Other Ambulatory Visit: Payer: Self-pay

## 2012-01-04 ENCOUNTER — Emergency Department (HOSPITAL_COMMUNITY): Payer: 59

## 2012-01-04 ENCOUNTER — Encounter (HOSPITAL_COMMUNITY): Payer: Self-pay | Admitting: Emergency Medicine

## 2012-01-04 DIAGNOSIS — E875 Hyperkalemia: Secondary | ICD-10-CM

## 2012-01-04 DIAGNOSIS — I509 Heart failure, unspecified: Secondary | ICD-10-CM | POA: Insufficient documentation

## 2012-01-04 DIAGNOSIS — E860 Dehydration: Secondary | ICD-10-CM

## 2012-01-04 DIAGNOSIS — B952 Enterococcus as the cause of diseases classified elsewhere: Secondary | ICD-10-CM

## 2012-01-04 DIAGNOSIS — R0789 Other chest pain: Secondary | ICD-10-CM | POA: Insufficient documentation

## 2012-01-04 DIAGNOSIS — IMO0001 Reserved for inherently not codable concepts without codable children: Secondary | ICD-10-CM

## 2012-01-04 DIAGNOSIS — G4733 Obstructive sleep apnea (adult) (pediatric): Secondary | ICD-10-CM

## 2012-01-04 DIAGNOSIS — I252 Old myocardial infarction: Secondary | ICD-10-CM | POA: Insufficient documentation

## 2012-01-04 DIAGNOSIS — M1610 Unilateral primary osteoarthritis, unspecified hip: Secondary | ICD-10-CM

## 2012-01-04 DIAGNOSIS — R609 Edema, unspecified: Secondary | ICD-10-CM | POA: Insufficient documentation

## 2012-01-04 DIAGNOSIS — E876 Hypokalemia: Secondary | ICD-10-CM

## 2012-01-04 DIAGNOSIS — R7989 Other specified abnormal findings of blood chemistry: Secondary | ICD-10-CM

## 2012-01-04 DIAGNOSIS — I10 Essential (primary) hypertension: Secondary | ICD-10-CM

## 2012-01-04 DIAGNOSIS — E119 Type 2 diabetes mellitus without complications: Secondary | ICD-10-CM

## 2012-01-04 DIAGNOSIS — R627 Adult failure to thrive: Secondary | ICD-10-CM

## 2012-01-04 DIAGNOSIS — E785 Hyperlipidemia, unspecified: Secondary | ICD-10-CM

## 2012-01-04 DIAGNOSIS — Z794 Long term (current) use of insulin: Secondary | ICD-10-CM | POA: Insufficient documentation

## 2012-01-04 DIAGNOSIS — D649 Anemia, unspecified: Secondary | ICD-10-CM

## 2012-01-04 DIAGNOSIS — N189 Chronic kidney disease, unspecified: Secondary | ICD-10-CM

## 2012-01-04 DIAGNOSIS — F29 Unspecified psychosis not due to a substance or known physiological condition: Principal | ICD-10-CM | POA: Insufficient documentation

## 2012-01-04 DIAGNOSIS — I2510 Atherosclerotic heart disease of native coronary artery without angina pectoris: Secondary | ICD-10-CM

## 2012-01-04 DIAGNOSIS — R945 Abnormal results of liver function studies: Secondary | ICD-10-CM | POA: Diagnosis present

## 2012-01-04 DIAGNOSIS — E871 Hypo-osmolality and hyponatremia: Secondary | ICD-10-CM

## 2012-01-04 DIAGNOSIS — I129 Hypertensive chronic kidney disease with stage 1 through stage 4 chronic kidney disease, or unspecified chronic kidney disease: Secondary | ICD-10-CM | POA: Insufficient documentation

## 2012-01-04 DIAGNOSIS — G2581 Restless legs syndrome: Secondary | ICD-10-CM

## 2012-01-04 DIAGNOSIS — N390 Urinary tract infection, site not specified: Secondary | ICD-10-CM

## 2012-01-04 DIAGNOSIS — K7290 Hepatic failure, unspecified without coma: Secondary | ICD-10-CM

## 2012-01-04 DIAGNOSIS — K7460 Unspecified cirrhosis of liver: Secondary | ICD-10-CM

## 2012-01-04 DIAGNOSIS — Z79899 Other long term (current) drug therapy: Secondary | ICD-10-CM | POA: Insufficient documentation

## 2012-01-04 DIAGNOSIS — K760 Fatty (change of) liver, not elsewhere classified: Secondary | ICD-10-CM

## 2012-01-04 DIAGNOSIS — R29898 Other symptoms and signs involving the musculoskeletal system: Secondary | ICD-10-CM | POA: Insufficient documentation

## 2012-01-04 DIAGNOSIS — N179 Acute kidney failure, unspecified: Secondary | ICD-10-CM

## 2012-01-04 DIAGNOSIS — D689 Coagulation defect, unspecified: Secondary | ICD-10-CM

## 2012-01-04 DIAGNOSIS — G894 Chronic pain syndrome: Secondary | ICD-10-CM

## 2012-01-04 DIAGNOSIS — K7682 Hepatic encephalopathy: Secondary | ICD-10-CM

## 2012-01-04 DIAGNOSIS — N181 Chronic kidney disease, stage 1: Secondary | ICD-10-CM | POA: Insufficient documentation

## 2012-01-04 DIAGNOSIS — R4182 Altered mental status, unspecified: Secondary | ICD-10-CM

## 2012-01-04 DIAGNOSIS — R0602 Shortness of breath: Secondary | ICD-10-CM | POA: Insufficient documentation

## 2012-01-04 DIAGNOSIS — I517 Cardiomegaly: Secondary | ICD-10-CM | POA: Insufficient documentation

## 2012-01-04 DIAGNOSIS — E039 Hypothyroidism, unspecified: Secondary | ICD-10-CM

## 2012-01-04 DIAGNOSIS — Z7982 Long term (current) use of aspirin: Secondary | ICD-10-CM | POA: Insufficient documentation

## 2012-01-04 HISTORY — DX: Dorsalgia, unspecified: M54.9

## 2012-01-04 LAB — CARDIAC PANEL(CRET KIN+CKTOT+MB+TROPI)
Relative Index: INVALID (ref 0.0–2.5)
Total CK: 47 U/L (ref 7–232)
Troponin I: <0.3 ng/mL (ref <0.30)

## 2012-01-04 LAB — APTT: aPTT: 39 seconds — ABNORMAL HIGH (ref 24–37)

## 2012-01-04 LAB — AMMONIA: Ammonia: 36 umol/L (ref 11–60)

## 2012-01-04 LAB — URINALYSIS, ROUTINE W REFLEX MICROSCOPIC
Leukocytes, UA: NEGATIVE
Nitrite: NEGATIVE
Specific Gravity, Urine: 1.016 (ref 1.005–1.030)
Urobilinogen, UA: 0.2 mg/dL (ref 0.0–1.0)
pH: 5.5 (ref 5.0–8.0)

## 2012-01-04 LAB — COMPREHENSIVE METABOLIC PANEL
BUN: 30 mg/dL — ABNORMAL HIGH (ref 6–23)
CO2: 26 mEq/L (ref 19–32)
Calcium: 8.4 mg/dL (ref 8.4–10.5)
Creatinine, Ser: 1.45 mg/dL — ABNORMAL HIGH (ref 0.50–1.35)
GFR calc Af Amer: 59 mL/min — ABNORMAL LOW (ref >90)
GFR calc non Af Amer: 51 mL/min — ABNORMAL LOW (ref >90)
Glucose, Bld: 168 mg/dL — ABNORMAL HIGH (ref 70–99)
Total Protein: 6.3 g/dL (ref 6.0–8.3)

## 2012-01-04 LAB — CBC
HCT: 31.3 % — ABNORMAL LOW (ref 39.0–52.0)
MCH: 30.7 pg (ref 26.0–34.0)
MCV: 87.4 fL (ref 78.0–100.0)
Platelets: 170 10*3/uL (ref 150–400)
RBC: 3.58 MIL/uL — ABNORMAL LOW (ref 4.22–5.81)

## 2012-01-04 LAB — DIFFERENTIAL
Eosinophils Absolute: 0.1 10*3/uL (ref 0.0–0.7)
Eosinophils Relative: 1 % (ref 0–5)
Lymphs Abs: 0.9 10*3/uL (ref 0.7–4.0)
Monocytes Absolute: 0.5 10*3/uL (ref 0.1–1.0)
Monocytes Relative: 5 % (ref 3–12)

## 2012-01-04 LAB — BASIC METABOLIC PANEL
Calcium: 8 mg/dL — ABNORMAL LOW (ref 8.4–10.5)
Chloride: 96 mEq/L (ref 96–112)
Creatinine, Ser: 1.38 mg/dL — ABNORMAL HIGH (ref 0.50–1.35)
GFR calc Af Amer: 62 mL/min — ABNORMAL LOW (ref >90)

## 2012-01-04 LAB — PROTIME-INR: Prothrombin Time: 21 seconds — ABNORMAL HIGH (ref 11.6–15.2)

## 2012-01-04 LAB — GLUCOSE, CAPILLARY: Glucose-Capillary: 99 mg/dL (ref 70–99)

## 2012-01-04 MED ORDER — SODIUM CHLORIDE 0.9 % IV SOLN: INTRAVENOUS | Status: DC

## 2012-01-04 MED ORDER — SODIUM CHLORIDE 0.9 % IV BOLUS (SEPSIS)
1000.0000 mL | Freq: Once | INTRAVENOUS | Status: AC
  Administered 2012-01-04: 1000 mL via INTRAVENOUS

## 2012-01-04 MED ORDER — INSULIN ASPART 100 UNIT/ML ~~LOC~~ SOLN
0.0000 [IU] | Freq: Three times a day (TID) | SUBCUTANEOUS | Status: DC
  Administered 2012-01-05: 3 [IU] via SUBCUTANEOUS
  Administered 2012-01-05: 5 [IU] via SUBCUTANEOUS
  Administered 2012-01-06 (×2): 3 [IU] via SUBCUTANEOUS

## 2012-01-04 MED ORDER — POTASSIUM CHLORIDE CRYS ER 20 MEQ PO TBCR
40.0000 meq | EXTENDED_RELEASE_TABLET | Freq: Once | ORAL | Status: AC
  Administered 2012-01-04: 40 meq via ORAL
  Filled 2012-01-04: qty 2

## 2012-01-04 NOTE — ED Notes (Signed)
Per EMS.  Pt from [facility].  Pt has cirrhosis and has become deluded and altered with ammonia of 184.  Pt also complaining of back pain.

## 2012-01-04 NOTE — ED Provider Notes (Signed)
History     CSN: [id number]  Arrival date & time 01/04/12  1646   First MD Initiated Contact with Patient 01/04/12 1656      Chief Complaint  Patient presents with  . Altered Mental Status  . Cirrhosis  . Hip Pain    (Consider location/radiation/quality/duration/timing/severity/associated sxs/prior treatment) Patient is a 61 y.o. male presenting with altered mental status. The history is provided by the patient and the spouse.  Altered Mental Status This is a new problem. The current episode started 2 days ago. The problem occurs constantly. The problem has been gradually worsening. Pertinent negatives include no chest pain, no abdominal pain, no headaches and no shortness of breath. Associated symptoms comments: Lower back pain. The symptoms are aggravated by nothing. He has tried nothing for the symptoms. The treatment provided no relief.    Past Medical History  Diagnosis Date  . Hypertension   . Type II or unspecified type diabetes mellitus without mention of complication, not stated as uncontrolled   . Thyroid disease   . Cirrhosis, non-alcoholic   . Morbid obesity   . Pancytopenia   . Fatty liver   . Coronary artery disease   . Myocardial infarction     "HEART INCIDENT " OCT 2012  . Angina     RARE USE OF NTG  . CHF (congestive heart failure)     JUNE 2012  . Arthritis   . Gout 35 YRS AGO  . Cellulitis of left leg   . Cirrhosis of liver     NONALCOHOLIC  . Nocturia   . Elevated BUN   . Elevated serum creatinine   . Serum ammonia increased   . Splenomegaly   . Platelets decreased   . Thyroid disorder     PT DOES NOT KNOW IF LOW OR HIGH  . Chronic kidney disease (CKD), stage III (moderate)   . Obstructive sleep apnea   . Hyponatremia   . Hyperkalemia   . Heart murmur     Past Surgical History  Procedure Date  . Coronary stent placement     2 CARDIAC STENTS OCT 08/2011  . Total hip arthroplasty 10/14/2011    Procedure: TOTAL HIP ARTHROPLASTY ANTERIOR  APPROACH;  Surgeon: Kathryne Hitch;  Location: [facility];  Service: Orthopedics;  Laterality: Right;  . Joint replacement   . Appendectomy 1968  . Tonsillectomy 1957  . Cardiac catheterization 09/08/2011    Family History  Problem Relation Age of Onset  . Colon cancer Neg Hx   . Diabetes Mother   . Diabetes Father     History  Substance Use Topics  . Smoking status: Never Smoker   . Smokeless tobacco: Never Used  . Alcohol Use: No      Review of Systems  Constitutional: Negative for fever and chills.  Respiratory: Negative for cough, shortness of breath and wheezing.   Cardiovascular: Positive for leg swelling. Negative for chest pain.  Gastrointestinal: Negative for nausea, vomiting, abdominal pain and diarrhea.       3 stools yesterday  Musculoskeletal: Positive for back pain.  Neurological: Negative for headaches.  Psychiatric/Behavioral: Positive for altered mental status.  All other systems reviewed and are negative.    Allergies  Crestor and Statins  Home Medications   Current Outpatient Rx  Name Route Sig Dispense Refill  . ASPIRIN 81 MG PO TABS Oral Take 81 mg by mouth every morning.     Marland Kitchen DOCUSATE SODIUM 100 MG PO CAPS Oral Take 100 mg  by mouth 2 (two) times daily.      . FUROSEMIDE 80 MG PO TABS Oral Take 0.5 tablets (40 mg total) by mouth 2 (two) times daily.    . INSULIN ASPART 100 UNIT/ML [place] SOLN Subcutaneous Inject 6 Units into the skin 3 (three) times daily with meals. As directed per sliding scale    . INSULIN DETEMIR 100 UNIT/ML [place] SOLN Subcutaneous Inject 44 Units into the skin at bedtime.    Marland Kitchen LACTULOSE 10 GM/15ML PO SOLN Oral Take 30 g by mouth 3 (three) times daily.      Marland Kitchen LEVOTHYROXINE SODIUM 50 MCG PO TABS Oral Take 50 mcg by mouth every morning.     Marland Kitchen METOPROLOL SUCCINATE ER 25 MG PO TB24  12.5 mg every morning.     . ADULT MULTIVITAMIN W/MINERALS CH Oral Take 1 tablet by mouth daily.    Marland Kitchen NITROGLYCERIN 0.4 MG SL SUBL Sublingual Place  0.4 mg under the tongue every 5 (five) minutes as needed. CHEST PAIN    . NYSTOP 100000 UNIT/GM EX POWD Topical Apply 1 g topically daily. topically    . OXYCODONE HCL ER 10 MG PO TB12 Oral Take 1 tablet (10 mg total) by mouth every 6 (six) hours as needed for pain. For pain 10 tablet 0  . PANTOPRAZOLE SODIUM 40 MG PO TBEC Oral Take 40 mg by mouth daily at 12 noon.      Marland Kitchen RIFAXIMIN 550 MG PO TABS Oral Take 550 mg by mouth 2 (two) times daily.      BP 122/77  Pulse 97  Temp(Src) 97.4 F (36.3 C) (Oral)  Resp 20  SpO2 100%  Physical Exam  Nursing note and vitals reviewed. Constitutional: He is oriented to person, place, and time. He appears well-developed and well-nourished. No distress.  HENT:  Head: Normocephalic and atraumatic.  Mouth/Throat: Oropharynx is clear and moist.  Eyes: Conjunctivae and EOM are normal. Pupils are equal, round, and reactive to light. Scleral icterus is present.  Neck: Normal range of motion. Neck supple.  Cardiovascular: Normal rate, regular rhythm and intact distal pulses.   No murmur heard. Pulmonary/Chest: Effort normal and breath sounds normal. No respiratory distress. He has no wheezes. He has no rales.  Abdominal: Soft. He exhibits fluid wave and ascites. He exhibits no distension. There is no tenderness. There is no rebound and no guarding.  Musculoskeletal: Normal range of motion. He exhibits edema. He exhibits no tenderness.       2+ edema in the lower extremities  Neurological: He is alert and oriented to person, place, and time.       Asterixis on exam. No focal neuro deficits. Difficulty with word finding with mild aphasia  Skin: Skin is warm and dry. No rash noted. No erythema.  Psychiatric: He has a normal mood and affect. His behavior is normal.    ED Course  Procedures (including critical care time)   Labs Reviewed  CBC  DIFFERENTIAL  COMPREHENSIVE METABOLIC PANEL  URINALYSIS, ROUTINE W REFLEX MICROSCOPIC  URINE CULTURE  CARDIAC  PANEL(CRET KIN+CKTOT+MB+TROPI)  PROTIME-INR  APTT  AMMONIA   Dg Chest 2 View  01/04/2012  *RADIOLOGY REPORT*  Clinical Data: Cirrhosis.  Pain.  CHEST - 2 VIEW  Comparison: 12/14/2011  Findings: Lung volumes are low. The cardiopericardial silhouette is enlarged. There is pulmonary vascular congestion without overt pulmonary edema.  No focal airspace consolidation or overt airspace edema.  No pleural effusion. Imaged bony structures of the thorax are intact.  IMPRESSION: Stable.  No acute findings.  Original Report Authenticated By: ERIC A. MANSELL, M.D.   Ct Head Wo Contrast  01/04/2012  *RADIOLOGY REPORT*  Clinical Data: Altered mental status  CT HEAD WITHOUT CONTRAST  Technique:  Contiguous axial images were obtained from the base of the skull through the vertex without contrast.  Comparison: 116 01/18/2012  Findings: Atherosclerotic and physiologic intracranial calcifications. There is no evidence of acute intracranial hemorrhage, brain edema, mass lesion, acute infarction,   mass effect, or midline shift. Acute infarct may be inapparent on noncontrast CT.  No other intra-axial abnormalities are seen, and the ventricles and sulci are within normal limits in size and symmetry.   No abnormal extra-axial fluid collections or masses are identified.  No significant calvarial abnormality.  IMPRESSION: 1. Negative for bleed or other acute intracranial process.  Original Report Authenticated By: Osa Craver, M.D.     Date: 01/04/2012  Rate: 88  Rhythm: normal sinus rhythm, PVC  QRS Axis: normal  Intervals: normal  ST/T Wave abnormalities: normal  Conduction Disutrbances:none  Narrative Interpretation:   Old EKG Reviewed: unchanged    1. Hypokalemia   2. Dehydration       MDM   Patient with a history of cirrhosis who over the last 3 days has had gradual deterioration of his mental status. He estimates in the past and his ammonia is elevated. Is still with difficulty word finding on  exam but no other focal symptoms. Signs of asterixis but no abdominal pain, fever, shortness of breath, chest pain. 2+ edema in the lower extremities.  His wife states the last time this happened he had a UTI. He states he is still taking his lactulose and had 2-3 bowel movements yesterday and the day before. The nursing home states his ammonia level was 184. Today he is complaining in his back. But no other complaints.  CBC, CMP, cardiac enzymes,, UA, chest x-ray head CT, EKG pending.  EKG within normal limits ammonia, cardiac enzymes, CBC within normal limits. CMP with signs of dehydration with doubling of creatinine from 0.8-1.45 and hypokalemia of 2.8. Based on these findings will for rehydration and observation        Gwyneth Sprout, MD 01/04/12 [phone]

## 2012-01-04 NOTE — ED Notes (Signed)
RUE:[id number]<JW> Expected date:01/04/12<BR> Expected time: 4:25 PM<BR> Means of arrival:Ambulance<BR> Comments:<BR> EMS 80

## 2012-01-04 NOTE — ED Notes (Signed)
Pt. Given sandwich and ginger ale.

## 2012-01-04 NOTE — H&P (Signed)
PCP:   REED,TIFFANY L., DO, DO   Chief Complaint:  Confused  HPI: Patient is a 61 year old white male with multiple medical admissions to the hospital. Patient was recently admitted in January for the same problem. Patient has a history of non-alcoholic cirrhosis of the liver. Per talking to his wife as an just this previous admission on patient was noted to be confused he did not know where he was what his name was on. Patient was brought to the hospital and after receiving some IV fluids patient became more alert. Patient now is talkative and is able to tell me that he prefers to [place] to [place] and that he normally takes his insulin for his diabetes and requests 5 E. Patient most likely is close to baseline. Per patient he sometimes is having problems finding words. Per his wife whenever he gets a flareup of his cirrhosis he normally has this problem. Except for his potassium being low all his other labs are not very impressive. Wife is at the bedside.  Review of Systems:  Negative otherwise stated in the history of present illness.  Past Medical History: Past Medical History  Diagnosis Date  . Hypertension   . Type II or unspecified type diabetes mellitus without mention of complication, not stated as uncontrolled   . Thyroid disease   . Cirrhosis, non-alcoholic   . Morbid obesity   . Pancytopenia   . Fatty liver   . Coronary artery disease   . Myocardial infarction     "HEART INCIDENT " OCT 2012  . Angina     RARE USE OF NTG  . CHF (congestive heart failure)     JUNE 2012  . Arthritis   . Gout 35 YRS AGO  . Cellulitis of left leg   . Cirrhosis of liver     NONALCOHOLIC  . Nocturia   . Elevated BUN   . Elevated serum creatinine   . Serum ammonia increased   . Splenomegaly   . Platelets decreased   . Thyroid disorder     PT DOES NOT KNOW IF LOW OR HIGH  . Chronic kidney disease (CKD), stage III (moderate)   . Obstructive sleep apnea   . Hyponatremia   .  Hyperkalemia   . Heart murmur   . Back pain     has compression fx   Past Surgical History  Procedure Date  . Coronary stent placement     2 CARDIAC STENTS OCT 08/2011  . Total hip arthroplasty 10/14/2011    Procedure: TOTAL HIP ARTHROPLASTY ANTERIOR APPROACH;  Surgeon: Kathryne Hitch;  Location: [facility];  Service: Orthopedics;  Laterality: Right;  . Joint replacement   . Appendectomy 1968  . Tonsillectomy 1957  . Cardiac catheterization 09/08/2011    Medications: Prior to Admission medications   Medication Sig Start Date End Date Taking? Authorizing Provider  Alum & Mag Hydroxide-Simeth (MAGIC MOUTHWASH) SOLN Swish and spit 5 mLs 4 (four) times daily as needed.   Yes Historical Provider, MD  aspirin 81 MG chewable tablet Chew 81 mg by mouth daily.   Yes Historical Provider, MD  calcitonin, salmon, (MIACALCIN/FORTICAL) 200 UNIT/ACT nasal spray Place 1 spray into the nose See admin instructions. 200 units spone spray Alternate nostrils every day   Yes Historical Provider, MD  docusate sodium (COLACE) 100 MG capsule Take 100 mg by mouth 2 (two) times daily.     Yes Historical Provider, MD  furosemide (LASIX) 80 MG tablet Take  0.5 tablets (40 mg total) by mouth 2 (two) times daily. 12/19/11 12/18/12 Yes Shanker Ghimire, MD  insulin aspart (NOVOLOG) 100 UNIT/ML injection Inject 6 Units into the skin 3 (three) times daily with meals. As directed per sliding scale 10/31/11 10/30/12 Yes Herchel Hopkin Wandra Mannan, MD  insulin detemir (LEVEMIR) 100 UNIT/ML injection Inject 44 Units into the skin at bedtime. 12/12/11 12/11/12 Yes Michelle A. Ashley Royalty, MD  lactulose (CHRONULAC) 10 GM/15ML solution Take 30 g by mouth 4 (four) times daily.    Yes Historical Provider, MD  levothyroxine (SYNTHROID, LEVOTHROID) 50 MCG tablet Take 50 mcg by mouth every morning.    Yes Historical Provider, MD  lidocaine (LIDODERM) 5 % Place 1 patch onto the skin daily. Remove & Discard patch within 12 hours or as directed by  MD   Yes Historical Provider, MD  metoprolol succinate (TOPROL-XL) 25 MG 24 hr tablet 12.5 mg every morning.    Yes Historical Provider, MD  Multiple Vitamin (MULITIVITAMIN WITH MINERALS) TABS Take 1 tablet by mouth daily.   Yes Historical Provider, MD  nitroGLYCERIN (NITROSTAT) 0.4 MG SL tablet Place 0.4 mg under the tongue every 5 (five) minutes as needed. CHEST PAIN   Yes Historical Provider, MD  nystatin (NYSTOP) 100000 UNIT/GM POWD Apply 1 g topically daily. topically 10/31/11  Yes Gryffin Altice Wandra Mannan, MD  oxyCODONE (OXYCONTIN) 10 MG 12 hr tablet Take 1 tablet (10 mg total) by mouth every 6 (six) hours as needed for pain. For pain 12/19/11  Yes Jeoffrey Massed, MD  pantoprazole (PROTONIX) 40 MG tablet Take 40 mg by mouth daily at 12 noon.   10/31/11 10/30/12 Yes Johnae Friley Wandra Mannan, MD  rifaximin (XIFAXAN) 550 MG TABS Take 550 mg by mouth 2 (two) times daily. 11/23/11  Yes Rob Bunting, MD  rOPINIRole (REQUIP) 1 MG tablet Take 1 mg by mouth at bedtime.   Yes Historical Provider, MD    Allergies:   Allergies  Allergen Reactions  . Crestor (Rosuvastatin Calcium)     unknown  . Statins Other (See Comments)    PT DOES NOT REMEMBER    Social History:  Reports that he has never smoked. He has never used smokeless tobacco. He reports that he does not drink alcohol or use illicit drugs. The patient is married with one child. He is on disability.  Family History: Family History  Problem Relation Age of Onset  . Colon cancer Neg Hx   . Diabetes Mother   . Diabetes Father     Physical Exam: Filed Vitals:   01/04/12 1648 01/04/12 1652 01/04/12 1928  BP:  122/77 113/64  Pulse:  97 87  Temp:  97.4 F (36.3 C)   TempSrc:  Oral   Resp:  20   SpO2: 95% 100% 100%   General: Patient appears his stated age. HEENT: Head normocephalic atraumatic. Cardiovascular: Regular rate rhythm. Lungs: Clear to auscultation bilaterally no wheezes rhonchi Berrios. Abdomen: Soft nontender and  nondistended positive bowel sounds obese Extremities: 2+ edema bilaterally Neuro exam seem overall nonfocal patient has chronic weakness in bilateral lower extremity strength is about 2-3/5. He seem to be able to find his words okay except if you bring the problem output then he seemed to have problems finding words at that time but then with normal conversation she seemed to do okay. He could not remember the year but he couldn't remember when he was recently in the hospital and talked about the experience at [facility] compared to [facility].   Labs on  Admission:   [facility] 01/04/12 1755  NA 130*  K 2.8*  CL 92*  CO2 26  GLUCOSE 168*  BUN 30*  CREATININE 1.45*  CALCIUM 8.4  MG --  PHOS --    Basename 01/04/12 1755  AST 32  ALT 19  ALKPHOS 150*  BILITOT 1.4*  PROT 6.3  ALBUMIN 1.7*   No results found for this basename: LIPASE:2,AMYLASE:2 in the last 72 hours  Basename 01/04/12 1755  WBC 10.4  NEUTROABS 8.9*  HGB 11.0*  HCT 31.3*  MCV 87.4  PLT 170    Basename 01/04/12 1755  CKTOTAL 47  CKMB 5.2*  CKMBINDEX --  TROPONINI <0.30   No results found for this basename: TSH,T4TOTAL,FREET3,T3FREE,THYROIDAB in the last 72 hours No results found for this basename: VITAMINB12:2,FOLATE:2,FERRITIN:2,TIBC:2,IRON:2,RETICCTPCT:2 in the last 72 hours  Radiological Exams on Admission: Dg Chest 2 View  01/04/2012  *RADIOLOGY REPORT*  Clinical Data: Cirrhosis.  Pain.  CHEST - 2 VIEW  Comparison: 12/14/2011  Findings: Lung volumes are low. The cardiopericardial silhouette is enlarged. There is pulmonary vascular congestion without overt pulmonary edema.  No focal airspace consolidation or overt airspace edema.  No pleural effusion. Imaged bony structures of the thorax are intact.  IMPRESSION: Stable.  No acute findings.  Original Report Authenticated By: ERIC A. MANSELL, M.D.   Dg Chest 2 View  12/14/2011  *RADIOLOGY REPORT*  Clinical Data: Chest pain, confusion, short of breath  CHEST - 2 VIEW   Comparison: 10/16/2011  Findings: Heart size there is a mildly enlarged.  Negative for heart failure.  Lungs are clear.  Negative for pneumonia or effusion.  IMPRESSION: No active cardiopulmonary disease.  Original Report Authenticated By: Camelia Phenes, M.D.   Ct Head Wo Contrast  01/04/2012  *RADIOLOGY REPORT*  Clinical Data: Altered mental status  CT HEAD WITHOUT CONTRAST  Technique:  Contiguous axial images were obtained from the base of the skull through the vertex without contrast.  Comparison: 116 01/18/2012  Findings: Atherosclerotic and physiologic intracranial calcifications. There is no evidence of acute intracranial hemorrhage, brain edema, mass lesion, acute infarction,   mass effect, or midline shift. Acute infarct may be inapparent on noncontrast CT.  No other intra-axial abnormalities are seen, and the ventricles and sulci are within normal limits in size and symmetry.   No abnormal extra-axial fluid collections or masses are identified.  No significant calvarial abnormality.  IMPRESSION: 1. Negative for bleed or other acute intracranial process.  Original Report Authenticated By: Osa Craver, M.D.   Ct Head Wo Contrast  12/14/2011  *RADIOLOGY REPORT*  Clinical Data: Altered mental status.  CT HEAD WITHOUT CONTRAST  Technique:  Contiguous axial images were obtained from the base of the skull through the vertex without contrast.  Comparison: CT of the head performed 12/03/2011  Findings: There is no evidence of acute infarction, mass lesion, or intra- or extra-axial hemorrhage on CT.  The posterior fossa, including the cerebellum, brainstem and fourth ventricle, is within normal limits.  The third and lateral ventricles, and basal ganglia are unremarkable in appearance.  The cerebral hemispheres are symmetric in appearance, with normal gray- white differentiation.  No mass effect or midline shift is seen.  There is no evidence of fracture; visualized osseous structures are  unremarkable in appearance.  The orbits are within normal limits. The paranasal sinuses and mastoid air cells are well-aerated.  No significant soft tissue abnormalities are seen.  IMPRESSION: Unremarkable noncontrast CT of the head.  Original Report Authenticated By:  JEFFREY Cherly Hensen, M.D.    Assessment/Plan Altered mental status Patient's mental status has greatly improved since he's been here in the emergency room. He received some IV fluids in the emergency room. His ammonia level is not significantly elevated his UA is negative. I am unsure of the cause of his intermittent confusion. He was admitted in January with the same presentation. Will continue him on his routine dose of lactulose. We'll decrease his IV fluids and continue for another 6 hours and then saline lock. Will hold his Lasix for today.  Hypokinemia  Hypokalemia is most like secondary to Lasix. Replete potassium and recheck BMP.  .Cirrhosis Continue home dose of lactulose  .Hypothyroidism Check TSH   .Diabetes mellitus Decrease the dose of Levemir and continue NovoLog   .RLS (restless legs syndrome) Continue Requip  Time spent on this patient including examination and decision-making process: 60 minutes.  Carollee Massed [phone] 01/04/2012, 10:17 PM

## 2012-01-05 DIAGNOSIS — D649 Anemia, unspecified: Secondary | ICD-10-CM | POA: Diagnosis present

## 2012-01-05 DIAGNOSIS — G894 Chronic pain syndrome: Secondary | ICD-10-CM | POA: Diagnosis present

## 2012-01-05 DIAGNOSIS — D689 Coagulation defect, unspecified: Secondary | ICD-10-CM | POA: Diagnosis present

## 2012-01-05 DIAGNOSIS — R945 Abnormal results of liver function studies: Secondary | ICD-10-CM | POA: Diagnosis present

## 2012-01-05 LAB — GLUCOSE, CAPILLARY
Glucose-Capillary: 210 mg/dL — ABNORMAL HIGH (ref 70–99)
Glucose-Capillary: 176 mg/dL — ABNORMAL HIGH (ref 70–99)
Glucose-Capillary: 143 mg/dL — ABNORMAL HIGH (ref 70–99)

## 2012-01-05 LAB — TSH: TSH: 4.274 u[IU]/mL (ref 0.350–4.500)

## 2012-01-05 LAB — BASIC METABOLIC PANEL
CO2: 26 mEq/L (ref 19–32)
Calcium: 8 mg/dL — ABNORMAL LOW (ref 8.4–10.5)
Chloride: 96 mEq/L (ref 96–112)
Glucose, Bld: 127 mg/dL — ABNORMAL HIGH (ref 70–99)
Sodium: 131 mEq/L — ABNORMAL LOW (ref 135–145)

## 2012-01-05 LAB — CBC
HCT: 29.6 % — ABNORMAL LOW (ref 39.0–52.0)
MCH: 29.9 pg (ref 26.0–34.0)
MCV: 86.8 fL (ref 78.0–100.0)
RBC: 3.41 MIL/uL — ABNORMAL LOW (ref 4.22–5.81)
WBC: 8.9 10*3/uL (ref 4.0–10.5)

## 2012-01-05 LAB — URINE CULTURE: Culture  Setup Time: 201302070128

## 2012-01-05 MED ORDER — CALCITONIN (SALMON) 200 UNIT/ACT NA SOLN
1.0000 | Freq: Every day | NASAL | Status: DC
  Administered 2012-01-05 – 2012-01-06 (×2): 1 via NASAL
  Filled 2012-01-05: qty 3.7

## 2012-01-05 MED ORDER — NYSTATIN 100000 UNIT/GM EX POWD
1.0000 g | Freq: Every day | CUTANEOUS | Status: DC
  Administered 2012-01-05 – 2012-01-06 (×2): 100000 [IU] via TOPICAL
  Filled 2012-01-05: qty 15

## 2012-01-05 MED ORDER — METOPROLOL SUCCINATE 12.5 MG HALF TABLET
12.5000 mg | ORAL_TABLET | ORAL | Status: DC
  Administered 2012-01-05 – 2012-01-06 (×2): 12.5 mg via ORAL
  Filled 2012-01-05 (×3): qty 1

## 2012-01-05 MED ORDER — POTASSIUM CHLORIDE CRYS ER 20 MEQ PO TBCR
40.0000 meq | EXTENDED_RELEASE_TABLET | Freq: Once | ORAL | Status: AC
  Administered 2012-01-05: 40 meq via ORAL
  Filled 2012-01-05: qty 2

## 2012-01-05 MED ORDER — INSULIN DETEMIR 100 UNIT/ML ~~LOC~~ SOLN
24.0000 [IU] | Freq: Every day | SUBCUTANEOUS | Status: DC
Start: 2012-01-05 — End: 2012-01-06
  Administered 2012-01-05: 24 [IU] via SUBCUTANEOUS
  Filled 2012-01-05: qty 3

## 2012-01-05 MED ORDER — SODIUM CHLORIDE 0.9 % IV SOLN
INTRAVENOUS | Status: AC
  Administered 2012-01-05: via INTRAVENOUS

## 2012-01-05 MED ORDER — ONDANSETRON HCL 4 MG/2ML IJ SOLN: 4.0000 mg | Freq: Four times a day (QID) | INTRAMUSCULAR | Status: DC | PRN

## 2012-01-05 MED ORDER — POTASSIUM CHLORIDE CRYS ER 20 MEQ PO TBCR
20.0000 meq | EXTENDED_RELEASE_TABLET | Freq: Two times a day (BID) | ORAL | Status: DC
  Administered 2012-01-06: 20 meq via ORAL
  Filled 2012-01-05 (×4): qty 1

## 2012-01-05 MED ORDER — ADULT MULTIVITAMIN W/MINERALS CH
1.0000 | ORAL_TABLET | Freq: Every day | ORAL | Status: DC
  Administered 2012-01-05 – 2012-01-06 (×2): 1 via ORAL
  Filled 2012-01-05 (×3): qty 1

## 2012-01-05 MED ORDER — ONDANSETRON HCL 4 MG PO TABS: 4.0000 mg | ORAL_TABLET | Freq: Four times a day (QID) | ORAL | Status: DC | PRN

## 2012-01-05 MED ORDER — LACTULOSE 10 GM/15ML PO SOLN
30.0000 g | Freq: Four times a day (QID) | ORAL | Status: DC
Start: 2012-01-05 — End: 2012-01-06
  Administered 2012-01-05: 30 g via ORAL
  Administered 2012-01-05: 20 g via ORAL
  Administered 2012-01-05 – 2012-01-06 (×4): 30 g via ORAL
  Filled 2012-01-05 (×12): qty 45

## 2012-01-05 MED ORDER — MAGIC MOUTHWASH
5.0000 mL | Freq: Four times a day (QID) | ORAL | Status: DC | PRN
Start: 2012-01-05 — End: 2012-01-06
  Filled 2012-01-05: qty 5

## 2012-01-05 MED ORDER — ALBUTEROL SULFATE (5 MG/ML) 0.5% IN NEBU: 2.5000 mg | INHALATION_SOLUTION | RESPIRATORY_TRACT | Status: DC | PRN

## 2012-01-05 MED ORDER — LIDOCAINE 5 % EX PTCH
1.0000 | MEDICATED_PATCH | CUTANEOUS | Status: DC
  Filled 2012-01-05 (×3): qty 1

## 2012-01-05 MED ORDER — NITROGLYCERIN 0.4 MG SL SUBL: 0.4000 mg | SUBLINGUAL_TABLET | SUBLINGUAL | Status: DC | PRN

## 2012-01-05 MED ORDER — ROPINIROLE HCL 1 MG PO TABS
1.0000 mg | ORAL_TABLET | Freq: Every day | ORAL | Status: DC
  Administered 2012-01-05: 1 mg via ORAL
  Filled 2012-01-05 (×3): qty 1

## 2012-01-05 MED ORDER — INSULIN ASPART 100 UNIT/ML ~~LOC~~ SOLN
6.0000 [IU] | Freq: Three times a day (TID) | SUBCUTANEOUS | Status: DC
  Administered 2012-01-05 – 2012-01-06 (×4): 6 [IU] via SUBCUTANEOUS
  Filled 2012-01-05: qty 3

## 2012-01-05 MED ORDER — RIFAXIMIN 550 MG PO TABS
550.0000 mg | ORAL_TABLET | Freq: Two times a day (BID) | ORAL | Status: DC
Start: 2012-01-05 — End: 2012-01-06
  Administered 2012-01-05 – 2012-01-06 (×4): 550 mg via ORAL
  Filled 2012-01-05 (×7): qty 1

## 2012-01-05 MED ORDER — ASPIRIN 81 MG PO CHEW
81.0000 mg | CHEWABLE_TABLET | Freq: Every day | ORAL | Status: DC
  Administered 2012-01-05 – 2012-01-06 (×2): 81 mg via ORAL
  Filled 2012-01-05 (×3): qty 1

## 2012-01-05 MED ORDER — PANTOPRAZOLE SODIUM 40 MG PO TBEC
40.0000 mg | DELAYED_RELEASE_TABLET | Freq: Every day | ORAL | Status: DC
  Administered 2012-01-05 – 2012-01-06 (×2): 40 mg via ORAL
  Filled 2012-01-05 (×3): qty 1

## 2012-01-05 MED ORDER — SODIUM CHLORIDE 0.9 % IJ SOLN
3.0000 mL | Freq: Two times a day (BID) | INTRAMUSCULAR | Status: DC
  Administered 2012-01-05 – 2012-01-06 (×3): 3 mL via INTRAVENOUS

## 2012-01-05 MED ORDER — LEVOTHYROXINE SODIUM 50 MCG PO TABS
50.0000 ug | ORAL_TABLET | ORAL | Status: DC
  Administered 2012-01-05 – 2012-01-06 (×2): 50 ug via ORAL
  Filled 2012-01-05 (×3): qty 1

## 2012-01-05 MED ORDER — FUROSEMIDE 40 MG PO TABS
40.0000 mg | ORAL_TABLET | Freq: Two times a day (BID) | ORAL | Status: DC
  Administered 2012-01-05 – 2012-01-06 (×4): 40 mg via ORAL
  Filled 2012-01-05 (×7): qty 1

## 2012-01-05 MED ORDER — DOCUSATE SODIUM 100 MG PO CAPS
100.0000 mg | ORAL_CAPSULE | Freq: Two times a day (BID) | ORAL | Status: DC
  Administered 2012-01-05 – 2012-01-06 (×2): 100 mg via ORAL
  Filled 2012-01-05 (×6): qty 1

## 2012-01-05 MED ORDER — OXYCODONE HCL 5 MG PO TABS
10.0000 mg | ORAL_TABLET | Freq: Four times a day (QID) | ORAL | Status: DC | PRN
  Administered 2012-01-05: 5 mg via ORAL
  Administered 2012-01-05 (×2): 10 mg via ORAL
  Filled 2012-01-05: qty 2
  Filled 2012-01-05: qty 1
  Filled 2012-01-05 (×2): qty 2

## 2012-01-05 MED ORDER — ENOXAPARIN SODIUM 60 MG/0.6ML ~~LOC~~ SOLN
60.0000 mg | SUBCUTANEOUS | Status: DC
  Administered 2012-01-05 – 2012-01-06 (×2): 60 mg via SUBCUTANEOUS
  Filled 2012-01-05 (×3): qty 0.6

## 2012-01-05 NOTE — Evaluation (Signed)
Physical Therapy Evaluation Patient Details Name: [name] MRN: [id number] DOB: [birth year] Today's Date: 01/05/2012  Problem List:  Patient Active Problem List  Diagnoses  . AODM  . OBSTRUCTIVE SLEEP APNEA  . Essential hypertension, benign  . Cirrhosis  . Coronary atherosclerosis of native coronary artery  . Hyperlipidemia  . Morbid obesity  . Arthritis pain of hip  . CRI (chronic renal insufficiency)  . NAFLD (nonalcoholic fatty liver disease)  . Anemia  . Hepatic encephalopathy  . Enterococcus UTI  . RLS (restless legs syndrome)  . Reflux  . Diabetes mellitus  . ARF (acute renal failure)  . E. coli UTI  . Hyponatremia  . Hypothyroidism  . FTT (failure to thrive) in adult  . Hyperkalemia  . Altered mental status  . Hypokalemia  . Coagulopathy  . Normocytic anemia  . Chronic pain syndrome  . Abnormal LFTs    Past Medical History:  Past Medical History  Diagnosis Date  . Hypertension   . Type II or unspecified type diabetes mellitus without mention of complication, not stated as uncontrolled   . Thyroid disease   . Cirrhosis, non-alcoholic   . Morbid obesity   . Pancytopenia   . Fatty liver   . Coronary artery disease   . Myocardial infarction     "HEART INCIDENT " OCT 2012  . Angina     RARE USE OF NTG  . CHF (congestive heart failure)     JUNE 2012  . Arthritis   . Gout 35 YRS AGO  . Cellulitis of left leg   . Cirrhosis of liver     NONALCOHOLIC  . Nocturia   . Elevated BUN   . Elevated serum creatinine   . Serum ammonia increased   . Splenomegaly   . Platelets decreased   . Thyroid disorder     PT DOES NOT KNOW IF LOW OR HIGH  . Chronic kidney disease (CKD), stage III (moderate)   . Obstructive sleep apnea   . Hyponatremia   . Hyperkalemia   . Heart murmur   . Back pain     has compression fx   Past Surgical History:  Past Surgical History  Procedure Date  . Coronary stent placement     2 CARDIAC STENTS OCT 08/2011  . 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 Joint replacement   . Appendectomy 1968  . Tonsillectomy 1957  . Cardiac catheterization 09/08/2011    PT Assessment/Plan/Recommendation PT Assessment Clinical Impression Statement: Pt admitted with AMS 2* hepatic encephalopathy however, cognition at baseline during evaluation.  Pt would benefit from acute PT services to improve independence with mobility and increase strength to prepare for d/c back to [facility] to continue rehab.  Pt required increased amount of time to perform just bed mobility 2* increased back pain (pt reports compression fx with no plans for surgery). PT Recommendation/Assessment: Patient will need skilled PT in the acute care venue PT Problem List: Decreased strength;Decreased activity tolerance;Decreased mobility;Pain;Decreased safety awareness PT Therapy Diagnosis : Difficulty walking;Generalized weakness PT Plan PT Frequency: Min 3X/week PT Treatment/Interventions: DME instruction;Gait training;Functional mobility training;Therapeutic activities;Therapeutic exercise;Patient/family education PT Recommendation Follow Up Recommendations: Skilled nursing facility Equipment Recommended: Defer to next venue PT Goals  Acute Rehab PT Goals PT Goal Formulation: With patient Time For Goal Achievement: 7 days Pt will go Supine/Side to Sit: with modified  independence;with HOB 0 degrees PT Goal: Supine/Side to Sit - Progress: Goal set today Pt will go Sit to Supine/Side: with modified independence;with HOB not 0 degrees (comment degree) PT Goal: Sit to Supine/Side - Progress: Goal set today Pt will go Sit to Stand: with min assist PT Goal: Sit to Stand - Progress: Goal set today Pt will go Stand to Sit: with min assist PT Goal: Stand to Sit - Progress: Goal set today Pt will Ambulate: 16 - 50  feet;with min assist;with rolling walker PT Goal: Ambulate - Progress: Goal set today  PT Evaluation Precautions/Restrictions  Precautions Precautions: Fall Prior Functioning  Home Living Lives With: Spouse Type of Home: House Home Layout: One level Home Access: Stairs to enter Entrance Stairs-Rails: Right Entrance Stairs-Number of Steps: 3 Bathroom Shower/Tub: Tub/shower unit (sink baths) Bathroom Toilet: Standard Home Adaptive Equipment: Dan Humphreys - four wheeled;Straight cane;Bedside commode/3-in-1 (trapeze) Additional Comments: Home Living from [facility] admission. Pt reports he is returning to [place] upon d/c. Prior Function Level of Independence: Independent with basic ADLs;Requires assistive device for independence;Needs assistance with homemaking Driving: No (Not since June) Comments: Pt reports assist for bed mobility at SNF but was working on ambulation with rollator in PT. Cognition Cognition Arousal/Alertness: Awake/alert Overall Cognitive Status: Appears within functional limits for tasks assessed Sensation/Coordination   Extremity Assessment RUE Assessment RUE Assessment: Within Functional Limits LUE Assessment LUE Assessment: Within Functional Limits RLE Assessment RLE Assessment: Exceptions to [facility] RLE Strength RLE Overall Strength Comments: able to move functionally although decreased ability to perform hip flexion against gravity observed (hx of R THR) LLE Assessment LLE Assessment: Within Functional Limits Mobility (including Balance) Bed Mobility Bed Mobility: Yes Rolling Right: 6: Modified independent (Device/Increase time) Supine to Sit: 2: Max assist Supine to Sit Details (indicate cue type and reason): pt attempted sidelying but eventually returned to supine position in attempt to sit upright 2* back pain and after increased time allowed PT to assist trunk upright but not able to tolerate sitting EOB for more than 1 minute, pt was able to bring LEs off EOB  but required assist for trunk Sit to Supine: 1: +2 Total assist;Patient percentage (comment) Sit to Supine - Details (indicate cue type and reason): pt=20%, increased assist 2* back pain (pt reports compression fx) Transfers Transfers: No    Exercise    End of Session PT - End of Session Activity Tolerance: Patient limited by pain Patient left: in bed;with call bell in reach General Behavior During Session: [facility] for tasks performed Cognition: [facility] for tasks performed  Lennis Rader,KATHrine E 01/05/2012, 11:30 AM Pager: [phone]

## 2012-01-05 NOTE — Progress Notes (Signed)
CSW visited with pt and completed a psychosocial assessment (pls see shadow chart). Pt is known to CSW from previous admissions Pts wife Eunice Blase is very supportive. Pt was discharged to [facility] for ST rehab following his last hospitalization and he plans to return there at discharge. CSW has called Danielle at the facility and left a voice message to see if it is okay for the pt to return. CSW will continue to follow the pt and offer support.  Patrice Paradise, LCSWA 01/05/2012 9:55 AM [phone]

## 2012-01-05 NOTE — Progress Notes (Signed)
PATIENT DETAILS Name: [name] Age: 61 y.o. Sex: male Date of Birth: [birth year] Admit Date: 01/04/2012 NWG:NFAO,ZHYQMVH L., DO, DO Emergency contact:   CONSULTS: None  Interval History: [name] is a 61 year old male with a PMH of NASH/cirrhosis with multiple hospitalizations for hepatic encephalopathy.  He was admitted on 01/04/12 with confusion and difficulty word finding.  He has been hydrated and is mentally clearer.  ROS: [name] states he feels mentally clear.  His speech is normal.  He denies SOB, cough.  He complains of left sided hip pain, which is chronic.   Objective: Vital signs in last 24 hours: Temp:  [97.4 F (36.3 C)-98.4 F (36.9 C)] 98.4 F (36.9 C) (02/07 0700) Pulse Rate:  [84-97] 84  (02/07 0700) Resp:  [18-20] 18  (02/07 0700) BP: (113-122)/(64-77) 121/74 mmHg (02/07 0700) SpO2:  [95 %-100 %] 97 % (02/07 0700) Weight:  [121 kg (266 lb 12.1 oz)] 121 kg (266 lb 12.1 oz) (02/06 2343) Weight change:  Last BM Date:  (unable to assess, pta)  Intake/Output from previous day:  Intake/Output Summary (Last 24 hours) at 01/05/12 0852 Last data filed at 01/05/12 0741  Gross per 24 hour  Intake 1087.5 ml  Output   1400 ml  Net -312.5 ml     Physical Exam:  Gen:  NAD Cardiovascular:  RRR, No M/R/G Respiratory: Lungs CTAB Gastrointestinal: Abdomen soft, NT/ND with normal active bowel sounds. Extremities: 1+ edema     Lab Results: Basic Metabolic Panel:  Lab 01/05/12 8469 01/04/12 2240 01/04/12 1755  NA 131* 132* 130*  K 3.3* 3.2* --  CL 96 96 92*  CO2 26 27 26   GLUCOSE 127* 163* 168*  BUN 29* 29* 30*  CREATININE 1.23 1.38* 1.45*  CALCIUM 8.0* 8.0* 8.4  MG 1.5 -- --  PHOS -- -- --   GFR Estimated Creatinine Clearance: 81 ml/min (by C-G formula based on Cr of 1.23). Liver Function Tests:  Lab 01/04/12 1755  AST 32  ALT 19  ALKPHOS 150*  BILITOT 1.4*  PROT 6.3  ALBUMIN 1.7*    Lab 01/04/12 1755  AMMONIA 36   Coagulation  profile  Lab 01/04/12 1755  INR 1.78*  PROTIME --    CBC:  Lab 01/05/12 0437 01/04/12 1755  WBC 8.9 10.4  NEUTROABS -- 8.9*  HGB 10.2* 11.0*  HCT 29.6* 31.3*  MCV 86.8 87.4  PLT 149* 170   Cardiac Enzymes:  Lab 01/04/12 1755  CKTOTAL 47  CKMB 5.2*  CKMBINDEX --  TROPONINI <0.30   CBG:  Lab 01/05/12 0713 01/04/12 2147  GLUCAP 95 99   Microbiology Recent Results (from the past 240 hour(s))  MRSA PCR SCREENING     Status: Normal   Collection Time   01/05/12 12:28 AM      Component Value Range Status Comment   MRSA by PCR NEGATIVE  NEGATIVE  Final     Recent Results (from the past 240 hour(s))  MRSA PCR SCREENING     Status: Normal   Collection Time   01/05/12 12:28 AM      Component Value Range Status Comment   MRSA by PCR NEGATIVE  NEGATIVE  Final     Studies/Results: Dg Chest 2 View  01/04/2012  *RADIOLOGY REPORT*  Clinical Data: Cirrhosis.  Pain.  CHEST - 2 VIEW  Comparison: 12/14/2011  Findings: Lung volumes are low. The cardiopericardial silhouette is enlarged. There is pulmonary vascular congestion without overt pulmonary edema.  No focal  airspace consolidation or overt airspace edema.  No pleural effusion. Imaged bony structures of the thorax are intact.  IMPRESSION: Stable.  No acute findings.  Original Report Authenticated By: ERIC A. MANSELL, M.D.   Ct Head Wo Contrast  01/04/2012  *RADIOLOGY REPORT*  Clinical Data: Altered mental status  CT HEAD WITHOUT CONTRAST  Technique:  Contiguous axial images were obtained from the base of the skull through the vertex without contrast.  Comparison: 116 01/18/2012  Findings: Atherosclerotic and physiologic intracranial calcifications. There is no evidence of acute intracranial hemorrhage, brain edema, mass lesion, acute infarction,   mass effect, or midline shift. Acute infarct may be inapparent on noncontrast CT.  No other intra-axial abnormalities are seen, and the ventricles and sulci are within normal limits in size and  symmetry.   No abnormal extra-axial fluid collections or masses are identified.  No significant calvarial abnormality.  IMPRESSION: 1. Negative for bleed or other acute intracranial process.  Original Report Authenticated By: Osa Craver, M.D.    Medications: Scheduled Meds:    . sodium chloride   Intravenous STAT  . aspirin  81 mg Oral Daily  . calcitonin (salmon)  1 spray Alternating Nares Daily  . docusate sodium  100 mg Oral BID  . enoxaparin  60 mg Subcutaneous Q24H  . furosemide  40 mg Oral BID  . insulin aspart  0-15 Units Subcutaneous TID WC  . insulin aspart  6 Units Subcutaneous TID WC  . insulin detemir  24 Units Subcutaneous QHS  . lactulose  30 g Oral QID  . levothyroxine  50 mcg Oral Q24H  . lidocaine  1 patch Transdermal Q24H  . metoprolol succinate  12.5 mg Oral Q24H  . mulitivitamin with minerals  1 tablet Oral Daily  . nystatin  1 g Topical Daily  . pantoprazole  40 mg Oral Q1200  . potassium chloride  40 mEq Oral Once  . rifaximin  550 mg Oral BID  . rOPINIRole  1 mg Oral QHS  . sodium chloride  1,000 mL Intravenous Once  . sodium chloride  3 mL Intravenous Q12H  . DISCONTD: sodium chloride   Intravenous STAT   Continuous Infusions:  PRN Meds:.albuterol, magic mouthwash, nitroGLYCERIN, ondansetron (ZOFRAN) IV, ondansetron, oxyCODONE Antibiotics: Anti-infectives     Start     Dose/Rate Route Frequency Ordered Stop   01/05/12 0006   rifaximin (XIFAXAN) tablet 550 mg        550 mg Oral 2 times daily 01/05/12 0007             Assessment/Plan:  Principal Problem:  *Altered mental status secondary to Hepatic encephalopathy Likely transient encephalopathy, appears to be resolved with giving IVF and holding lasix 01/04/12.  Will get PT/OT evaluations and plan on d/c back to [facility] 01/06/12.  Continue Lactulose/Rifaximin. Active Problems:  Cirrhosis / Abnormal LFTs / Coagulopathy Continue supportive care with Lactulose, Rifaximin, diuretics.  RLS  (restless legs syndrome) Stable on Ropinirole.  Diabetes mellitus CBGs 95-99, controlled.  Continue Levemir, Novolog 6 units Q AC and SSI, moderate scale.  Hyponatremia Chronic, secondary to cirrhosis.  Continue diuretics.  Hypothyroidism Continue usual dose of synthroid.  F/U TSH (pending).  Hypokalemia Replace.  On diuretics.  Add routine supplementation.  Normocytic anemia Chronic, secondary to chronic illness.  Hemoglobin is stable.  Chronic pain syndrome Continue pain medications as needed.    LOS: 1 day   Hillery Aldo, MD Pager [phone]  01/05/2012, 8:52 AM

## 2012-01-05 NOTE — Progress Notes (Signed)
Chart review done. 

## 2012-01-06 ENCOUNTER — Ambulatory Visit: Payer: 59 | Admitting: Gastroenterology

## 2012-01-06 LAB — GLUCOSE, CAPILLARY: Glucose-Capillary: 184 mg/dL — ABNORMAL HIGH (ref 70–99)

## 2012-01-06 MED ORDER — FUROSEMIDE 80 MG PO TABS: ORAL_TABLET | ORAL | Status: DC

## 2012-01-06 MED ORDER — LACTULOSE 10 GM/15ML PO SOLN: 30.0000 g | Freq: Every day | ORAL | Status: DC

## 2012-01-06 MED ORDER — POTASSIUM CHLORIDE CRYS ER 20 MEQ PO TBCR: 20.0000 meq | EXTENDED_RELEASE_TABLET | Freq: Two times a day (BID) | ORAL | Status: DC

## 2012-01-06 NOTE — Progress Notes (Signed)
Patient set to discharge back to [facility] today. Wife, Eunice Blase made aware. [facility] called for transportation.   Unice Bailey, LCSWA [phone]

## 2012-01-06 NOTE — Progress Notes (Cosign Needed)
Patient well known to our service, admitted with recurrent enceophalopathy. He had an appointment with Dr. Christella Hartigan this am but hospitalized. Patient for discharge today and hospitalist called to briefly discuss patient's diuretics as he is being discharged today. Patient has not been on Aldactone since last hospital discharge. His creatinine is 1.23. Patient will be sent out on Lasix 60mg  bid and will get CMET and CBC early next week. His appointent with Dr. Christella Hartigan was rescheduled to 01/11/12 at 9:45

## 2012-01-06 NOTE — Discharge Summary (Signed)
[facility] Hospital Discharge Summary  Date of Admission: 01/04/2012  4:46 PM Admitter: @ADMITPROV @   Date of Discharge2/06/2012 Attending Physician: Pleas Koch, MD  Discharge diagnosis-confusion likely secondary to hepatic encephalopathy  Consults-none  Procedures-    CT head 01/04/12 showing negative for bleed or other inch acute intracranial process                         Chest x-ray 01/04/12-stable chest x-ray with no acute findings   Urine culture-no growth  Brief admission history-61 year old male known [name] + cirrhosis followed by Dr. 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 With IV fluid hydration holding his Lasix in addition to giving his medications this got better over the course of hospitalization. However it was noted once again after an prior to discharge the patient had an episode where he is forgetful of where he was where they had dinner he was not sure how to turn on the television and did not know what purposes his glasses served.  He remembered the episode and only last a short period of time. No focal deficits and no other issues over hospitalization other than this one episode. Tells me that he is to see Dr. Christella Hartigan for further review of this case and care today but this has to be rescheduled Noted at last discharge that he was discharged home on only Lasix and no spironolactone At the time of this discharge it is noted that he is doing well and has no further confusional spells. He does feel weak in his lower back secondary to compression fracture but has no other memory issues. He's able to tell me that he is at [facility], he is able to resect me completely what he had  breakfast this am-hadsausage and toast and apple juice. he is able to tell me who his primary-care physician is and also is able to tell me that he is at [facility].  Hospital Course by problem list: Patient Active Hospital Problem List: Altered mental status (12/15/2011)   Assessment: Most likely secondary to hepatic encephalopathy-although his mentation also seems to have gotten better with fluid repletion. I have contacted [facility] the patient follows with normally to let him know that he is here.  They have recommended that for his diuretics because he's not on any Aldactone, he should be on Lasix 60 mg twice a day. I have increased lactulose to 5 times a day 30 mg and he'll continue his Rifamycin without any change--1-2 doses of lactulose can be held if he experiences profuse diarrhea  Gastroenterology was arranged a followup appointment March 15 at 9:15 AM and has recommended getting a complete metabolic panel/CBC/INR one to 2 days prior to this for them to review   Cirrhosis (08/22/2011)   Assessment: Per Gastroenterology-followup scheduled for today however he will be seen briefly here and he might be adjusted   Hepatic encephalopathy (10/24/2011)   Assessment: Likely cause for altered mental status. Discussion done today with wife and with patient. I explained that this may be a waxing waning phenomenon related to his encephalopathy   RLS (restless legs syndrome) (10/26/2011)   Assessment: Continue Requip 1 mg at bedtime   Diabetes mellitus (11/18/2011)   Assessment: Blood sugars well controlled over  hospitalization last ranges 143-176 continue Levemir 44 units at nursing home and 3 times a day insulin 6 units regular   Hyponatremia (12/05/2011)   Assessment: Tonic and likely related to number ?1 liver decompensation #2 use of diuretics   Hypothyroidism (12/05/2011)   Assessment: Continue levothyroxine 50 mcg daily.  need TSH in 6-8 weeks   Hypokalemia (01/04/2012)   Assessment: Likely  related to diuresis he was replaced on his potassium and 20 milligrams and we'll continue the sam  Coagulopathy (01/05/2012)   Assessment: I not elevated at baseline and this is chronic likely related to his cirrhosis. He did not require any vitamin K. at this admission   Normocytic anemia (01/05/2012)   Assessment: Stable hospital problem. Had some mild thrombocytopenia. Recommend getting a CBC in one to 2 day   chronic kidney disease stage I to 2-follows with Dr. Darrick Penna for this-need close followup for the same  CHF/CAD-supposed Celexa scan with Dr. Excell Seltzer in 3 months, last saw him 12/26/28. Stable   LOS: 2 days     Procedures Performed and pertinent labs: Dg Chest 2 View  01/04/2012  *RADIOLOGY REPORT*  Clinical Data: Cirrhosis.  Pain.  CHEST - 2 VIEW  Comparison: 12/14/2011  Findings: Lung volumes are low. The cardiopericardial silhouette is enlarged. There is pulmonary vascular congestion without overt pulmonary edema.  No focal airspace consolidation or overt airspace edema.  No pleural effusion. Imaged bony structures of the thorax are intact.  IMPRESSION: Stable.  No acute findings.  Original Report Authenticated By: ERIC A. MANSELL, M.D.   Dg Chest 2 View  12/14/2011  *RADIOLOGY REPORT*  Clinical Data: Chest pain, confusion, short of breath  CHEST - 2 VIEW  Comparison: 10/16/2011  Findings: Heart size there is a mildly enlarged.  Negative for heart failure.  Lungs are clear.  Negative for pneumonia or effusion.  IMPRESSION: No active cardiopulmonary disease.  Original Report Authenticated By: Camelia Phenes, M.D.   Ct Head Wo Contrast  01/04/2012  *RADIOLOGY REPORT*  Clinical Data: Altered mental status  CT HEAD WITHOUT CONTRAST  Technique:  Contiguous axial images were obtained from the base of the skull through the vertex without contrast.  Comparison: 116 01/18/2012  Findings: Atherosclerotic and physiologic intracranial calcifications. There is no evidence of acute intracranial  hemorrhage, brain edema, mass lesion, acute infarction,   mass effect, or midline shift. Acute infarct may be inapparent on noncontrast CT.  No other intra-axial abnormalities are seen, and the ventricles and sulci are within normal limits in size and symmetry.   No abnormal extra-axial fluid collections or masses are identified.  No significant calvarial abnormality.  IMPRESSION: 1. Negative for bleed or other acute intracranial process.  Original Report Authenticated By: Osa Craver, M.D.   Ct Head Wo Contrast  12/14/2011  *RADIOLOGY REPORT*  Clinical Data: Altered mental status.  CT HEAD WITHOUT CONTRAST  Technique:  Contiguous axial images were obtained from the base of the skull through the vertex without contrast.  Comparison: CT of the head performed 12/03/2011  Findings: There is no evidence of acute infarction, mass lesion, or intra- or extra-axial hemorrhage on CT.  The posterior fossa, including the cerebellum, brainstem and fourth ventricle, is within normal limits.  The third and lateral ventricles, and basal ganglia are unremarkable in appearance.  The cerebral hemispheres are symmetric in appearance, with normal gray- white differentiation.  No mass effect or midline shift is seen.  There is no evidence of fracture; visualized osseous structures are  unremarkable in appearance.  The orbits are within normal limits. The paranasal sinuses and mastoid air cells are well-aerated.  No significant soft tissue abnormalities are seen.  IMPRESSION: Unremarkable noncontrast CT of the head.  Original Report Authenticated By: Tonia Ghent, M.D.    Discharge Vitals & PE:  BP 109/75  Pulse 95  Temp(Src) 98.3 F (36.8 C) (Oral)  Resp 20  Ht 5\' 9"  (1.753 m)  Wt 121 kg (266 lb 12.1 oz)  BMI 39.39 kg/m2  SpO2 99% See above note Discharge Labs:  Results for orders placed during the hospital encounter of 01/04/12 (from the past 24 hour(s))  GLUCOSE, CAPILLARY     Status: Abnormal   Collection  Time   01/05/12 11:52 AM      Component Value Range   Glucose-Capillary 210 (*) 70 - 99 (mg/dL)  GLUCOSE, CAPILLARY     Status: Abnormal   Collection Time   01/05/12  5:14 PM      Component Value Range   Glucose-Capillary 176 (*) 70 - 99 (mg/dL)  GLUCOSE, CAPILLARY     Status: Abnormal   Collection Time   01/05/12  9:54 PM      Component Value Range   Glucose-Capillary 143 (*) 70 - 99 (mg/dL)   Comment 1 Notify RN      Disposition and follow-up:   Mr.Revere SANTOSH PETTER was discharged from in good condition.    Follow-up Appointments: Follow-up Information    Follow up with GREEN, Lenon Curt, MD in 1 day.   Contact information:   [address] [place] [address] [phone]       Follow up with Rob Bunting, MD. (March 15th, 09:15 am)    Contact information:   [address] [place] [address] [phone]         Discharge Medications: Medication List  As of 01/06/2012  9:11 AM   TAKE these medications         aspirin 81 MG chewable tablet   Chew 81 mg by mouth daily.      calcitonin (salmon) 200 UNIT/ACT nasal spray   Commonly known as: MIACALCIN/FORTICAL   Place 1 spray into the nose See admin instructions. 200 units spone spray Alternate nostrils every day      docusate sodium 100 MG capsule   Commonly known as: COLACE   Take 100 mg by mouth 2 (two) times daily.      furosemide 60 MG tablet   Commonly known as: LASIX   Take 1 tablet twice a day      insulin aspart 100 UNIT/ML injection   Commonly known as: novoLOG   Inject 6 Units into the skin 3 (three) times daily with meals. As directed per sliding scale      insulin detemir 100 UNIT/ML injection   Commonly known as: LEVEMIR   Inject 44 Units into the skin at bedtime.      lactulose 10 GM/15ML solution   Commonly known as: CHRONULAC   Take 45 mLs (30 g total) by mouth 5 (five) times daily.      levothyroxine 50 MCG tablet   Commonly known as:  SYNTHROID, LEVOTHROID   Take 50 mcg by mouth every morning.      lidocaine 5 %   Commonly known as: LIDODERM   Place 1 patch onto the skin daily. Remove & Discard patch within 12 hours or as directed by MD      magic  mouthwash Soln   Swish and spit 5 mLs 4 (four) times daily as needed.      metoprolol succinate 25 MG 24 hr tablet   Commonly known as: TOPROL-XL   12.5 mg every morning.      mulitivitamin with minerals Tabs   Take 1 tablet by mouth daily.      nitroGLYCERIN 0.4 MG SL tablet   Commonly known as: NITROSTAT   Place 0.4 mg under the tongue every 5 (five) minutes as needed. CHEST PAIN      nystatin 100000 UNIT/GM Powd   Apply 1 g topically daily. topically      pantoprazole 40 MG tablet   Commonly known as: PROTONIX   Take 40 mg by mouth daily at 12 noon.      potassium chloride SA 20 MEQ tablet   Commonly known as: K-DUR,KLOR-CON   Take 1 tablet (20 mEq total) by mouth 2 (two) times daily.      rifaximin 550 MG Tabs   Commonly known as: XIFAXAN   Take 550 mg by mouth 2 (two) times daily.      rOPINIRole 1 MG tablet   Commonly known as: REQUIP   Take 1 mg by mouth at bedtime.           Medications Discontinued During This Encounter  Medication Reason  . aspirin 81 MG tablet Error  . 0.9 %  sodium chloride infusion   . oxyCODONE (OXYCONTIN) 10 MG 12 hr tablet Discontinued by provider  . lactulose (CHRONULAC) 10 GM/15ML solution     Signed: Kelliann Pendergraph,JAI 01/06/2012, 9:11 AM

## 2012-01-08 NOTE — Assessment & Plan Note (Signed)
I think his best to avoid any statin agent considering his hepatic cirrhosis.  The patient's most recent LDL cholesterol was 107 mg per deciliter.

## 2012-01-08 NOTE — Assessment & Plan Note (Signed)
61 year old gentleman with extensive coronary disease status post two-vessel PCI utilizing bare-metal stents. The patient had a high risk Myoview scan prior to his intervention and he was asymptomatic at the time. Recommend repeat nuclear scan in about 3 months before he follows up to reassess his ischemic burden. We'll continue his current medical program.

## 2012-01-11 ENCOUNTER — Other Ambulatory Visit (INDEPENDENT_AMBULATORY_CARE_PROVIDER_SITE_OTHER): Payer: 59

## 2012-01-11 ENCOUNTER — Encounter: Payer: Self-pay | Admitting: Gastroenterology

## 2012-01-11 ENCOUNTER — Ambulatory Visit (INDEPENDENT_AMBULATORY_CARE_PROVIDER_SITE_OTHER): Payer: 59 | Admitting: Gastroenterology

## 2012-01-11 VITALS — BP 120/70 | HR 70 | Ht 69.0 in | Wt 247.6 lb

## 2012-01-11 DIAGNOSIS — K7460 Unspecified cirrhosis of liver: Secondary | ICD-10-CM

## 2012-01-11 LAB — BASIC METABOLIC PANEL
CO2: 26 mEq/L (ref 19–32)
Calcium: 7.9 mg/dL — ABNORMAL LOW (ref 8.4–10.5)
Creatinine, Ser: 1.2 mg/dL (ref 0.4–1.5)
GFR: 62.89 mL/min (ref >60.00)
Sodium: 129 mEq/L — ABNORMAL LOW (ref 135–145)

## 2012-01-11 MED ORDER — FUROSEMIDE 80 MG PO TABS: 120.0000 mg | ORAL_TABLET | Freq: Two times a day (BID) | ORAL | Status: DC

## 2012-01-11 MED ORDER — LACTULOSE 10 GM/15ML PO SOLN: 30.0000 g | Freq: Three times a day (TID) | ORAL | Status: AC

## 2012-01-11 NOTE — Progress Notes (Signed)
Review of pertinent gastrointestinal problems:  1. Cirrhosis: Likely from fatty liver, perhaps cardiac related  Labs July 2012: ANA negative, HIV negative, iron studies normal, hepatitis B. surface antigen negative, hepatitis B core antibody negative, hepatitis C antibody negative, hepatitis A IgM negative. Anti-mitochondrial antibody negative, hepatitis B surface antibody negative, hepatitis a total antibody negative, ceruloplasmin normal.  Most recent imaging: CT scan June 2012 showed pleural effusion, ascites, cirrhosis and splenomegaly (non-IV contrast due to renal insufficiency). .  Most recent EGD: September 2012 found mild nonspecific gastritis but no varices  Most recet AFP aug 2012 normal Repeated episodes of hepatic encephalopathy despite xifaxa twice daily, lactulose  bid times a day (CT scan December, January, February 2013 during these episodes were all negative for intracranial process) 2. Routine risk for colon cancer, colonoscopy September 2012 was normal. Next colonoscopy at 10 year interval   HPI: This is a  very pleasant but medically very complex man who is here with his wife today.   He was very tearful, he is very frustrated with his situation, multiple admissions to hospital.    He has been in and out of the hospital 3-4 times with mild hepatic encephalopathy over the past 3 months. Some of this has been attributed to over diuresis. I believe once he had a mild UTI as well. Currently he is living at [facility] lactulose 4-5 times a day.  Causes a lot of bloating, cramping.  Has 4-5 loose stools a day.  According [facility] home[facility] list he is currently on 120 lasix twice daily at [facility].  Potassium 20 qd.    Past Medical History  Diagnosis Date  . Hypertension   . Type II or unspecified type diabetes mellitus without mention of complication, not stated as uncontrolled   . Thyroid disease   . Cirrhosis, non-alcoholic   . Morbid obesity   .  Pancytopenia   . Fatty liver   . Coronary artery disease   . Myocardial infarction     "HEART INCIDENT " OCT 2012  . Angina     RARE USE OF NTG  . CHF (congestive heart failure)     JUNE 2012  . Arthritis   . Gout 35 YRS AGO  . Cellulitis of left leg   . Cirrhosis of liver     NONALCOHOLIC  . Nocturia   . Elevated BUN   . Elevated serum creatinine   . Serum ammonia increased   . Splenomegaly   . Platelets decreased   . Thyroid disorder     PT DOES NOT KNOW IF LOW OR HIGH  . Chronic kidney disease (CKD), stage III (moderate)   . Obstructive sleep apnea   . Hyponatremia   . Hyperkalemia   . Heart murmur   . Back pain     has compression fx    Past Surgical History  Procedure Date  . Coronary stent placement     2 CARDIAC STENTS OCT 08/2011  . Total hip arthroplasty 10/14/2011    Procedure: TOTAL HIP ARTHROPLASTY ANTERIOR APPROACH;  Surgeon: Kathryne Hitch;  Location: [facility];  Service: Orthopedics;  Laterality: Right;  . Joint replacement   . Appendectomy 1968  . Tonsillectomy 1957  . Cardiac catheterization 09/08/2011    Current Outpatient Prescriptions  Medication Sig Dispense Refill  . Alum & Mag Hydroxide-Simeth (MAGIC MOUTHWASH) SOLN Swish and spit 5 mLs 4 (four) times daily as needed.      Marland Kitchen aspirin 81 MG  chewable tablet Chew 81 mg by mouth daily.      . calcitonin, salmon, (MIACALCIN/FORTICAL) 200 UNIT/ACT nasal spray Place 1 spray into the nose See admin instructions. 200 units spone spray Alternate nostrils every day      . docusate sodium (COLACE) 100 MG capsule Take 100 mg by mouth 2 (two) times daily.        . furosemide (LASIX) 80 MG tablet 60 mg 2 (two) times daily.      . insulin aspart (NOVOLOG) 100 UNIT/ML injection Inject 6 Units into the skin 3 (three) times daily with meals. As directed per sliding scale      . insulin detemir (LEVEMIR) 100 UNIT/ML injection Inject 44 Units into the skin at bedtime.      Marland Kitchen lactulose (CHRONULAC) 10 GM/15ML  solution Take 45 mLs (30 g total) by mouth 5 (five) times daily.  240 mL  0  . levothyroxine (SYNTHROID, LEVOTHROID) 50 MCG tablet Take 50 mcg by mouth every morning.       . lidocaine (LIDODERM) 5 % Place 1 patch onto the skin daily. Remove & Discard patch within 12 hours or as directed by MD      . metoprolol succinate (TOPROL-XL) 25 MG 24 hr tablet 12.5 mg every morning.       . Multiple Vitamin (MULITIVITAMIN WITH MINERALS) TABS Take 1 tablet by mouth daily.      . nitroGLYCERIN (NITROSTAT) 0.4 MG SL tablet Place 0.4 mg under the tongue every 5 (five) minutes as needed. CHEST PAIN      . nystatin (NYSTOP) 100000 UNIT/GM POWD Apply 1 g topically daily. topically      . pantoprazole (PROTONIX) 40 MG tablet Take 40 mg by mouth daily at 12 noon.        . potassium chloride SA (K-DUR,KLOR-CON) 20 MEQ tablet Take 1 tablet (20 mEq total) by mouth 2 (two) times daily.  20 tablet  0  . rifaximin (XIFAXAN) 550 MG TABS Take 550 mg by mouth 2 (two) times daily.      Marland Kitchen rOPINIRole (REQUIP) 1 MG tablet Take 1 mg by mouth at bedtime.        Allergies as of 01/11/2012 - Review Complete 01/11/2012  Allergen Reaction Noted  . Crestor (rosuvastatin calcium)  10/24/2011  . Statins Other (See Comments) 07/12/2011    Family History  Problem Relation Age of Onset  . Colon cancer Neg Hx   . Diabetes Mother   . Diabetes Father     History   Social History  . Marital Status: Married    Spouse Name: N/A    Number of Children: 1  . Years of Education: 14   Occupational History  . Optician, dispensing (retired)   . Masters degree in [place]    Social History Main Topics  . Smoking status: Never Smoker   . Smokeless tobacco: Never Used  . Alcohol Use: No  . Drug Use: No  . Sexually Active: No   Other Topics Concern  . Not on file   Social History Narrative   Married, lives with wife in [place].  Ambulates with walker.      Physical Exam: BP 120/70  Pulse 70  Ht 5\' 9"  (1.753 m)  Wt 247 lb 9.6  oz (112.311 kg)  BMI 36.56 kg/m2 Constitutional: gmorbidly obese, chronically ill appearing Psychiatric: alert and oriented x3 Abdomen: soft, nontender, nondistended, no obvious ascites, no peritoneal signs, normal bowel sounds Lower extremities with 1+ pitting edema bilaterally  Assessment and plan: 61 y.o. male with cirrhosis  We are having a lot of difficulties with edema, hepatic encephalopathy, electrolyte control. This is often a very bad sign in terms of life expectancy for people with cirrhosis. The current lactulose dose is giving him too much bloating and cramping and so I have made an adjustment on that, he is having 4-5 loose stools a day when the goal is 2-3 loose stools a day. He is according to his medication list from [facility], taking 120 mg of Lasix twice daily. He is not on aldactone. He is going to get a basic metabolic profile and I might adjust his medicines based on those results.    He needs to return to see me in 6 weeks and sooner if needed. I am happy to discuss any changes in his clinical status with his caregivers at [facility]. He may not always need to be sent to the emergency room for evaluation of a mental status changes.

## 2012-01-11 NOTE — Patient Instructions (Addendum)
It is important that you have a low salt diet.  High salt diet can cause fluid to accumulate in your legs, abdomen and even around your lungs. You should try to avoid NSAID type over the counter pain medicines as best as possible. Tylenol is safe to take for 'routine' aches and pains, but never take more than 1/2 the dose suggested on the package instructions (never more than 2 grams per day). Avoid alcohol. You will have labs checked today in the basement lab.  Please head down after you check out with the front desk  (bmet).  Will adjust your diuretics if needed based on BMET results. Lactulose should be 3 times a day. This dose was changed on your med list here). Your med list from [place] shows lasix dosing is 120mg  twice daily (your med list here was adjusted to match this). Return to see Dr. Christella Hartigan in 6 weeks, sooner if needed.

## 2012-01-12 ENCOUNTER — Other Ambulatory Visit: Payer: Self-pay

## 2012-01-12 DIAGNOSIS — E871 Hypo-osmolality and hyponatremia: Secondary | ICD-10-CM

## 2012-01-12 DIAGNOSIS — E876 Hypokalemia: Secondary | ICD-10-CM

## 2012-01-13 ENCOUNTER — Other Ambulatory Visit: Payer: Self-pay

## 2012-01-13 DIAGNOSIS — K7460 Unspecified cirrhosis of liver: Secondary | ICD-10-CM

## 2012-01-19 ENCOUNTER — Telehealth: Payer: Self-pay | Admitting: Gastroenterology

## 2012-01-19 NOTE — Telephone Encounter (Signed)
[phone] fax to [facility] so that all labs will be faxed to Dr Christella Hartigan.  Letter has been faxed as requested.

## 2012-01-20 ENCOUNTER — Inpatient Hospital Stay (HOSPITAL_COMMUNITY)
Admission: EM | Admit: 2012-01-20 | Discharge: 2012-01-27 | DRG: 393 | Disposition: A | Discharge status: Other Acute Inpatient Hospital | Payer: 59 | Attending: Internal Medicine | Admitting: Internal Medicine

## 2012-01-20 ENCOUNTER — Encounter (HOSPITAL_COMMUNITY): Payer: Self-pay | Admitting: Emergency Medicine

## 2012-01-20 ENCOUNTER — Emergency Department (HOSPITAL_COMMUNITY): Payer: 59

## 2012-01-20 DIAGNOSIS — E876 Hypokalemia: Secondary | ICD-10-CM

## 2012-01-20 DIAGNOSIS — I509 Heart failure, unspecified: Secondary | ICD-10-CM | POA: Diagnosis present

## 2012-01-20 DIAGNOSIS — K659 Peritonitis, unspecified: Secondary | ICD-10-CM

## 2012-01-20 DIAGNOSIS — Z96649 Presence of unspecified artificial hip joint: Secondary | ICD-10-CM

## 2012-01-20 DIAGNOSIS — R945 Abnormal results of liver function studies: Secondary | ICD-10-CM

## 2012-01-20 DIAGNOSIS — E079 Disorder of thyroid, unspecified: Secondary | ICD-10-CM | POA: Diagnosis present

## 2012-01-20 DIAGNOSIS — I2510 Atherosclerotic heart disease of native coronary artery without angina pectoris: Secondary | ICD-10-CM

## 2012-01-20 DIAGNOSIS — E875 Hyperkalemia: Secondary | ICD-10-CM

## 2012-01-20 DIAGNOSIS — G894 Chronic pain syndrome: Secondary | ICD-10-CM

## 2012-01-20 DIAGNOSIS — D684 Acquired coagulation factor deficiency: Secondary | ICD-10-CM | POA: Diagnosis present

## 2012-01-20 DIAGNOSIS — N2889 Other specified disorders of kidney and ureter: Secondary | ICD-10-CM | POA: Diagnosis present

## 2012-01-20 DIAGNOSIS — N200 Calculus of kidney: Secondary | ICD-10-CM | POA: Diagnosis present

## 2012-01-20 DIAGNOSIS — I252 Old myocardial infarction: Secondary | ICD-10-CM

## 2012-01-20 DIAGNOSIS — N289 Disorder of kidney and ureter, unspecified: Secondary | ICD-10-CM

## 2012-01-20 DIAGNOSIS — K7460 Unspecified cirrhosis of liver: Secondary | ICD-10-CM | POA: Diagnosis present

## 2012-01-20 DIAGNOSIS — M1610 Unilateral primary osteoarthritis, unspecified hip: Secondary | ICD-10-CM

## 2012-01-20 DIAGNOSIS — N183 Chronic kidney disease, stage 3 unspecified: Secondary | ICD-10-CM | POA: Diagnosis present

## 2012-01-20 DIAGNOSIS — R188 Other ascites: Secondary | ICD-10-CM | POA: Diagnosis present

## 2012-01-20 DIAGNOSIS — D638 Anemia in other chronic diseases classified elsewhere: Secondary | ICD-10-CM | POA: Diagnosis present

## 2012-01-20 DIAGNOSIS — M129 Arthropathy, unspecified: Secondary | ICD-10-CM | POA: Diagnosis present

## 2012-01-20 DIAGNOSIS — D649 Anemia, unspecified: Secondary | ICD-10-CM | POA: Diagnosis present

## 2012-01-20 DIAGNOSIS — E039 Hypothyroidism, unspecified: Secondary | ICD-10-CM

## 2012-01-20 DIAGNOSIS — I10 Essential (primary) hypertension: Secondary | ICD-10-CM | POA: Diagnosis present

## 2012-01-20 DIAGNOSIS — K7682 Hepatic encephalopathy: Secondary | ICD-10-CM

## 2012-01-20 DIAGNOSIS — Z9861 Coronary angioplasty status: Secondary | ICD-10-CM

## 2012-01-20 DIAGNOSIS — Z7982 Long term (current) use of aspirin: Secondary | ICD-10-CM

## 2012-01-20 DIAGNOSIS — K661 Hemoperitoneum: Principal | ICD-10-CM | POA: Diagnosis present

## 2012-01-20 DIAGNOSIS — IMO0001 Reserved for inherently not codable concepts without codable children: Secondary | ICD-10-CM

## 2012-01-20 DIAGNOSIS — J90 Pleural effusion, not elsewhere classified: Secondary | ICD-10-CM | POA: Diagnosis present

## 2012-01-20 DIAGNOSIS — E119 Type 2 diabetes mellitus without complications: Secondary | ICD-10-CM | POA: Diagnosis present

## 2012-01-20 DIAGNOSIS — Z7401 Bed confinement status: Secondary | ICD-10-CM

## 2012-01-20 DIAGNOSIS — E871 Hypo-osmolality and hyponatremia: Secondary | ICD-10-CM

## 2012-01-20 DIAGNOSIS — A498 Other bacterial infections of unspecified site: Secondary | ICD-10-CM | POA: Diagnosis present

## 2012-01-20 DIAGNOSIS — R58 Hemorrhage, not elsewhere classified: Secondary | ICD-10-CM

## 2012-01-20 DIAGNOSIS — I129 Hypertensive chronic kidney disease with stage 1 through stage 4 chronic kidney disease, or unspecified chronic kidney disease: Secondary | ICD-10-CM | POA: Diagnosis present

## 2012-01-20 DIAGNOSIS — K6819 Other retroperitoneal abscess: Secondary | ICD-10-CM | POA: Diagnosis present

## 2012-01-20 DIAGNOSIS — D689 Coagulation defect, unspecified: Secondary | ICD-10-CM | POA: Diagnosis present

## 2012-01-20 DIAGNOSIS — K760 Fatty (change of) liver, not elsewhere classified: Secondary | ICD-10-CM

## 2012-01-20 DIAGNOSIS — R601 Generalized edema: Secondary | ICD-10-CM | POA: Diagnosis present

## 2012-01-20 DIAGNOSIS — G4733 Obstructive sleep apnea (adult) (pediatric): Secondary | ICD-10-CM | POA: Diagnosis present

## 2012-01-20 DIAGNOSIS — K683 Retroperitoneal hematoma: Secondary | ICD-10-CM | POA: Diagnosis present

## 2012-01-20 DIAGNOSIS — N390 Urinary tract infection, site not specified: Secondary | ICD-10-CM | POA: Diagnosis present

## 2012-01-20 DIAGNOSIS — E785 Hyperlipidemia, unspecified: Secondary | ICD-10-CM

## 2012-01-20 DIAGNOSIS — D72829 Elevated white blood cell count, unspecified: Secondary | ICD-10-CM | POA: Diagnosis present

## 2012-01-20 DIAGNOSIS — N189 Chronic kidney disease, unspecified: Secondary | ICD-10-CM

## 2012-01-20 DIAGNOSIS — R7989 Other specified abnormal findings of blood chemistry: Secondary | ICD-10-CM

## 2012-01-20 DIAGNOSIS — Z23 Encounter for immunization: Secondary | ICD-10-CM

## 2012-01-20 DIAGNOSIS — Z79899 Other long term (current) drug therapy: Secondary | ICD-10-CM

## 2012-01-20 DIAGNOSIS — D62 Acute posthemorrhagic anemia: Secondary | ICD-10-CM | POA: Diagnosis present

## 2012-01-20 DIAGNOSIS — Z6841 Body Mass Index (BMI) 40.0 and over, adult: Secondary | ICD-10-CM

## 2012-01-20 DIAGNOSIS — Z794 Long term (current) use of insulin: Secondary | ICD-10-CM

## 2012-01-20 DIAGNOSIS — R4182 Altered mental status, unspecified: Secondary | ICD-10-CM

## 2012-01-20 DIAGNOSIS — N179 Acute kidney failure, unspecified: Secondary | ICD-10-CM

## 2012-01-20 DIAGNOSIS — K7290 Hepatic failure, unspecified without coma: Secondary | ICD-10-CM

## 2012-01-20 DIAGNOSIS — G2581 Restless legs syndrome: Secondary | ICD-10-CM

## 2012-01-20 DIAGNOSIS — R627 Adult failure to thrive: Secondary | ICD-10-CM

## 2012-01-20 DIAGNOSIS — K7689 Other specified diseases of liver: Secondary | ICD-10-CM | POA: Diagnosis present

## 2012-01-20 LAB — COMPREHENSIVE METABOLIC PANEL
ALT: 17 U/L (ref 0–53)
Albumin: 1.3 g/dL — ABNORMAL LOW (ref 3.5–5.2)
Alkaline Phosphatase: 228 U/L — ABNORMAL HIGH (ref 39–117)
Chloride: 90 mEq/L — ABNORMAL LOW (ref 96–112)
Glucose, Bld: 145 mg/dL — ABNORMAL HIGH (ref 70–99)
Potassium: 4.8 mEq/L (ref 3.5–5.1)
Sodium: 128 mEq/L — ABNORMAL LOW (ref 135–145)
Total Bilirubin: 2 mg/dL — ABNORMAL HIGH (ref 0.3–1.2)
Total Protein: 6 g/dL (ref 6.0–8.3)

## 2012-01-20 LAB — LIPASE, BLOOD: Lipase: 12 U/L (ref 11–59)

## 2012-01-20 LAB — DIFFERENTIAL
Basophils Relative: 0 % (ref 0–1)
Eosinophils Absolute: 0 10*3/uL (ref 0.0–0.7)
Lymphs Abs: 1.3 10*3/uL (ref 0.7–4.0)
Monocytes Relative: 4 % (ref 3–12)
Neutro Abs: 19.8 10*3/uL — ABNORMAL HIGH (ref 1.7–7.7)
Neutrophils Relative %: 90 % — ABNORMAL HIGH (ref 43–77)

## 2012-01-20 LAB — URINALYSIS, ROUTINE W REFLEX MICROSCOPIC
Glucose, UA: NEGATIVE mg/dL
Hgb urine dipstick: NEGATIVE
Specific Gravity, Urine: 1.02 (ref 1.005–1.030)
pH: 5 (ref 5.0–8.0)

## 2012-01-20 LAB — URINE MICROSCOPIC-ADD ON

## 2012-01-20 LAB — CBC
Hemoglobin: 13.3 g/dL (ref 13.0–17.0)
MCH: 30.1 pg (ref 26.0–34.0)
Platelets: 257 10*3/uL (ref 150–400)
RBC: 4.42 MIL/uL (ref 4.22–5.81)

## 2012-01-20 LAB — AMMONIA: Ammonia: 37 umol/L (ref 11–60)

## 2012-01-20 MED ORDER — SODIUM CHLORIDE 0.9 % IV SOLN
Freq: Once | INTRAVENOUS | Status: AC
  Administered 2012-01-20: 21:00:00 via INTRAVENOUS

## 2012-01-20 MED ORDER — DEXTROSE 5 % IV SOLN: 500.0000 mg | INTRAVENOUS | Status: DC

## 2012-01-20 MED ORDER — VANCOMYCIN HCL IN DEXTROSE 1-5 GM/200ML-% IV SOLN
1000.0000 mg | Freq: Once | INTRAVENOUS | Status: AC
  Administered 2012-01-21: 1000 mg via INTRAVENOUS
  Filled 2012-01-20: qty 200

## 2012-01-20 MED ORDER — DEXTROSE 5 % IV SOLN
2.0000 g | Freq: Once | INTRAVENOUS | Status: AC
  Administered 2012-01-21: 2 g via INTRAVENOUS
  Filled 2012-01-20: qty 2

## 2012-01-20 MED ORDER — MORPHINE SULFATE 4 MG/ML IJ SOLN
4.0000 mg | Freq: Once | INTRAMUSCULAR | Status: AC
  Administered 2012-01-20: 4 mg via INTRAVENOUS
  Filled 2012-01-20: qty 1

## 2012-01-20 MED ORDER — ONDANSETRON HCL 4 MG/2ML IJ SOLN
4.0000 mg | Freq: Once | INTRAMUSCULAR | Status: AC
  Administered 2012-01-20: 4 mg via INTRAVENOUS
  Filled 2012-01-20: qty 2

## 2012-01-20 MED ORDER — DEXTROSE 5 % IV SOLN: 1.0000 g | INTRAVENOUS | Status: DC

## 2012-01-20 NOTE — ED Notes (Signed)
[id number]<JY> Expected date:01/20/12<BR> Expected time: 6:07 PM<BR> Means of arrival:Ambulance<BR> Comments:<BR> P33. 61 yo m. abd pain. From [facility]. Dr Debroah Baller eval. 10 mins

## 2012-01-20 NOTE — ED Provider Notes (Addendum)
History     CSN: [id number]  Arrival date & time 01/20/12  7829   First MD Initiated Contact with Patient 01/20/12 1941      Chief Complaint  Patient presents with  . Abdominal Pain    (Consider location/radiation/quality/duration/timing/severity/associated sxs/prior treatment) Patient is a 61 y.o. male presenting with abdominal pain. The history is provided by the patient and a relative.  Abdominal Pain The primary symptoms of the illness include abdominal pain.   patient presents with abdominal pain improving x24 hours. History of cirrhosis and liver disease. Bruising was noted on his abdomen as well as his left upper extremity and right thigh. No recent trauma. No dyspnea noted. No urinary symptoms appreciated no fever or cough. History of ascites in the past paracentesis. Nothing makes the symptoms better or worse. No black or bloody stools noted. Some increased confusion noted. History of hepatic encephalopathy  Past Medical History  Diagnosis Date  . Hypertension   . Type II or unspecified type diabetes mellitus without mention of complication, not stated as uncontrolled   . Thyroid disease   . Cirrhosis, non-alcoholic   . Morbid obesity   . Pancytopenia   . Fatty liver   . Coronary artery disease   . Myocardial infarction     "HEART INCIDENT " OCT 2012  . Angina     RARE USE OF NTG  . CHF (congestive heart failure)     JUNE 2012  . Arthritis   . Gout 35 YRS AGO  . Cellulitis of left leg   . Cirrhosis of liver     NONALCOHOLIC  . Nocturia   . Elevated BUN   . Elevated serum creatinine   . Serum ammonia increased   . Splenomegaly   . Platelets decreased   . Thyroid disorder     PT DOES NOT KNOW IF LOW OR HIGH  . Chronic kidney disease (CKD), stage III (moderate)   . Obstructive sleep apnea   . Hyponatremia   . Hyperkalemia   . Heart murmur   . Back pain     has compression fx    Past Surgical History  Procedure Date  . Coronary stent placement     2  CARDIAC STENTS OCT 08/2011  . Total hip arthroplasty 10/14/2011    Procedure: TOTAL HIP ARTHROPLASTY ANTERIOR APPROACH;  Surgeon: Kathryne Hitch;  Location: [facility];  Service: Orthopedics;  Laterality: Right;  . Joint replacement   . Appendectomy 1968  . Tonsillectomy 1957  . Cardiac catheterization 09/08/2011    Family History  Problem Relation Age of Onset  . Colon cancer Neg Hx   . Diabetes Mother   . Diabetes Father     History  Substance Use Topics  . Smoking status: Never Smoker   . Smokeless tobacco: Never Used  . Alcohol Use: No      Review of Systems  Gastrointestinal: Positive for abdominal pain.  All other systems reviewed and are negative.    Allergies  Crestor and Statins  Home Medications   Current Outpatient Rx  Name Route Sig Dispense Refill  . MAGIC MOUTHWASH Swish & Spit Swish and spit 5 mLs 4 (four) times daily as needed.    . ASPIRIN 81 MG PO CHEW Oral Chew 81 mg by mouth daily.    Marland Kitchen CALCITONIN (SALMON) 200 UNIT/ACT NA SOLN Nasal Place 1 spray into the nose See admin instructions. 200 units spone spray Alternate nostrils every day    . DOCUSATE SODIUM 100  MG PO CAPS Oral Take 100 mg by mouth 2 (two) times daily.      . FUROSEMIDE 80 MG PO TABS Oral Take 120 mg by mouth 2 (two) times daily.    . INSULIN ASPART 100 UNIT/ML [place] SOLN Subcutaneous Inject 6 Units into the skin 3 (three) times daily with meals. As directed per sliding scale    . INSULIN DETEMIR 100 UNIT/ML [place] SOLN Subcutaneous Inject 44 Units into the skin at bedtime.    Marland Kitchen LACTULOSE 10 GM/15ML PO SOLN Oral Take 45 mLs (30 g total) by mouth 3 (three) times daily. 240 mL 0  . LEVOTHYROXINE SODIUM 50 MCG PO TABS Oral Take 50 mcg by mouth every morning.     Marland Kitchen LIDOCAINE 5 % EX PTCH Transdermal Place 1 patch onto the skin daily. Remove & Discard patch within 12 hours or as directed by MD    . METOPROLOL SUCCINATE ER 25 MG PO TB24  12.5 mg daily.     . ADULT MULTIVITAMIN W/MINERALS CH Oral  Take 1 tablet by mouth daily.    Marland Kitchen NITROGLYCERIN 0.4 MG SL SUBL Sublingual Place 0.4 mg under the tongue every 5 (five) minutes as needed. CHEST PAIN    . NYSTOP 100000 UNIT/GM EX POWD Topical Apply 1 g topically daily. topically    . PANTOPRAZOLE SODIUM 40 MG PO TBEC Oral Take 40 mg by mouth daily at 12 noon.      Marland Kitchen POTASSIUM CHLORIDE CRYS ER 20 MEQ PO TBCR Oral Take 1 tablet (20 mEq total) by mouth 2 (two) times daily. 20 tablet 0  . RIFAXIMIN 550 MG PO TABS Oral Take 550 mg by mouth 2 (two) times daily.    Marland Kitchen ROPINIROLE HCL 1 MG PO TABS Oral Take 1 mg by mouth at bedtime.      BP 111/77  Pulse 113  Temp(Src) 97.8 F (36.6 C) (Oral)  Resp 20  SpO2 93%  Physical Exam  Nursing note and vitals reviewed. Constitutional: He is oriented to person, place, and time. He appears well-developed and well-nourished.  Non-toxic appearance. No distress.  HENT:  Head: Normocephalic and atraumatic.  Eyes: Conjunctivae, EOM and lids are normal. Pupils are equal, round, and reactive to light.  Neck: Normal range of motion. Neck supple. No tracheal deviation present. No mass present.  Cardiovascular: Regular rhythm and normal heart sounds.  Tachycardia present.  Exam reveals no gallop.   No murmur heard. Pulmonary/Chest: Effort normal and breath sounds normal. No stridor. No respiratory distress. He has no decreased breath sounds. He has no wheezes. He has no rhonchi. He has no rales.  Abdominal: Soft. Normal appearance and bowel sounds are normal. He exhibits distension, fluid wave and ascites. There is no tenderness. There is no rigidity, no rebound, no guarding and no CVA tenderness.    Musculoskeletal: Normal range of motion. He exhibits no edema and no tenderness.  Neurological: He is alert and oriented to person, place, and time. He has normal strength. No cranial nerve deficit or sensory deficit. GCS eye subscore is 4. GCS verbal subscore is 5. GCS motor subscore is 6.  Skin: Skin is warm and dry.  Ecchymosis, petechiae, purpura and rash noted. No abrasion noted.  Psychiatric: His affect is blunt. His speech is delayed. He is slowed.    ED Course  Procedures (including critical care time)   Labs Reviewed  CBC  DIFFERENTIAL  COMPREHENSIVE METABOLIC PANEL  PROTIME-INR  APTT  URINALYSIS, ROUTINE W REFLEX MICROSCOPIC  URINE  CULTURE  AMMONIA  SAMPLE TO BLOOD BANK   No results found.   No diagnosis found.    MDM  Patient's x-rays and labs reviewed. Pt started on vancomycin and fortaz for suspected peritonitis. Spoke with dr. Nedra Hai and he will perform a paracentesis         Toy Baker, MD 01/20/12 5621  Toy Baker, MD 01/20/12 3086  Toy Baker, MD 01/20/12 [phone]

## 2012-01-20 NOTE — ED Notes (Signed)
Pt's PIV infiltrated. PIV removed and IV Team RN called for another PIV insertion.

## 2012-01-20 NOTE — ED Notes (Addendum)
Per EMS and PCP note.  Pt has been having abd pain, abd swelling and increased confusion for hte past 4 days.  Send to ED to rule out ascites. No n/v/d.  PCP noted also lists rising BNP and creatinine as well as neutrophilia

## 2012-01-21 ENCOUNTER — Encounter (HOSPITAL_COMMUNITY): Payer: Self-pay | Admitting: Internal Medicine

## 2012-01-21 ENCOUNTER — Emergency Department (HOSPITAL_COMMUNITY): Payer: 59

## 2012-01-21 DIAGNOSIS — K683 Retroperitoneal hematoma: Secondary | ICD-10-CM | POA: Diagnosis present

## 2012-01-21 DIAGNOSIS — N390 Urinary tract infection, site not specified: Secondary | ICD-10-CM | POA: Diagnosis present

## 2012-01-21 DIAGNOSIS — R58 Hemorrhage, not elsewhere classified: Secondary | ICD-10-CM | POA: Diagnosis present

## 2012-01-21 LAB — GLUCOSE, CAPILLARY: Glucose-Capillary: 176 mg/dL — ABNORMAL HIGH (ref 70–99)

## 2012-01-21 LAB — HEMOGLOBIN AND HEMATOCRIT, BLOOD
HCT: 36.8 % — ABNORMAL LOW (ref 39.0–52.0)
HCT: 32.4 % — ABNORMAL LOW (ref 39.0–52.0)
Hemoglobin: 12.5 g/dL — ABNORMAL LOW (ref 13.0–17.0)
Hemoglobin: 10.8 g/dL — ABNORMAL LOW (ref 13.0–17.0)

## 2012-01-21 LAB — PROTIME-INR: Prothrombin Time: 24.4 seconds — ABNORMAL HIGH (ref 11.6–15.2)

## 2012-01-21 LAB — CBC
HCT: 32.7 % — ABNORMAL LOW (ref 39.0–52.0)
Hemoglobin: 11 g/dL — ABNORMAL LOW (ref 13.0–17.0)
RBC: 3.68 MIL/uL — ABNORMAL LOW (ref 4.22–5.81)
WBC: 15.3 10*3/uL — ABNORMAL HIGH (ref 4.0–10.5)

## 2012-01-21 LAB — HEMOGLOBIN A1C
Hgb A1c MFr Bld: 5.5 % (ref <5.7)
Mean Plasma Glucose: 111 mg/dL (ref <117)

## 2012-01-21 LAB — AMMONIA: Ammonia: 22 umol/L (ref 11–60)

## 2012-01-21 LAB — MAGNESIUM: Magnesium: 2.1 mg/dL (ref 1.5–2.5)

## 2012-01-21 MED ORDER — SODIUM CHLORIDE 0.9 % IJ SOLN
3.0000 mL | Freq: Two times a day (BID) | INTRAMUSCULAR | Status: DC
  Administered 2012-01-21 – 2012-01-26 (×4): 3 mL via INTRAVENOUS

## 2012-01-21 MED ORDER — SODIUM CHLORIDE 0.9 % IV SOLN: 250.0000 mL | INTRAVENOUS | Status: DC | PRN

## 2012-01-21 MED ORDER — LEVOTHYROXINE SODIUM 50 MCG PO TABS
50.0000 ug | ORAL_TABLET | Freq: Every day | ORAL | Status: DC
  Administered 2012-01-21 – 2012-01-27 (×7): 50 ug via ORAL
  Filled 2012-01-21 (×9): qty 1

## 2012-01-21 MED ORDER — DOCUSATE SODIUM 100 MG PO CAPS
100.0000 mg | ORAL_CAPSULE | Freq: Two times a day (BID) | ORAL | Status: DC
  Administered 2012-01-21 – 2012-01-27 (×10): 100 mg via ORAL
  Filled 2012-01-21 (×16): qty 1

## 2012-01-21 MED ORDER — ADULT MULTIVITAMIN W/MINERALS CH
1.0000 | ORAL_TABLET | Freq: Every day | ORAL | Status: DC
  Administered 2012-01-21 – 2012-01-27 (×7): 1 via ORAL
  Filled 2012-01-21 (×8): qty 1

## 2012-01-21 MED ORDER — PHYTONADIONE 5 MG PO TABS
10.0000 mg | ORAL_TABLET | Freq: Once | ORAL | Status: AC
  Administered 2012-01-21: 10 mg via ORAL
  Filled 2012-01-21: qty 2

## 2012-01-21 MED ORDER — CALCITONIN (SALMON) 200 UNIT/ACT NA SOLN
1.0000 | Freq: Every morning | NASAL | Status: DC
  Administered 2012-01-21 – 2012-01-27 (×7): 1 via NASAL
  Filled 2012-01-21: qty 3.7

## 2012-01-21 MED ORDER — PANTOPRAZOLE SODIUM 40 MG PO TBEC
40.0000 mg | DELAYED_RELEASE_TABLET | Freq: Every day | ORAL | Status: DC
  Administered 2012-01-21 – 2012-01-27 (×7): 40 mg via ORAL
  Filled 2012-01-21 (×8): qty 1

## 2012-01-21 MED ORDER — NYSTATIN 100000 UNIT/GM EX POWD
1.0000 g | Freq: Every day | CUTANEOUS | Status: DC
  Administered 2012-01-21 – 2012-01-27 (×6): 100000 [IU] via TOPICAL
  Filled 2012-01-21 (×3): qty 15

## 2012-01-21 MED ORDER — MAGIC MOUTHWASH
5.0000 mL | Freq: Four times a day (QID) | ORAL | Status: DC | PRN
  Administered 2012-01-21 (×2): 5 mL via ORAL
  Filled 2012-01-21: qty 5

## 2012-01-21 MED ORDER — HYDROMORPHONE HCL PF 1 MG/ML IJ SOLN
0.5000 mg | INTRAMUSCULAR | Status: DC | PRN
  Administered 2012-01-22 (×2): 0.5 mg via INTRAVENOUS
  Administered 2012-01-23: 14:00:00 via INTRAVENOUS
  Administered 2012-01-23: 0.5 mg via INTRAVENOUS
  Administered 2012-01-23: 08:00:00 via INTRAVENOUS
  Administered 2012-01-24 – 2012-01-27 (×13): 0.5 mg via INTRAVENOUS
  Filled 2012-01-21 (×20): qty 1

## 2012-01-21 MED ORDER — LACTULOSE 10 GM/15ML PO SOLN
30.0000 g | Freq: Three times a day (TID) | ORAL | Status: DC
  Administered 2012-01-21 – 2012-01-27 (×17): 30 g via ORAL
  Filled 2012-01-21 (×24): qty 45

## 2012-01-21 MED ORDER — ROPINIROLE HCL 1 MG PO TABS
1.0000 mg | ORAL_TABLET | Freq: Every day | ORAL | Status: DC
  Administered 2012-01-21 – 2012-01-26 (×6): 1 mg via ORAL
  Filled 2012-01-21 (×8): qty 1

## 2012-01-21 MED ORDER — RIFAXIMIN 550 MG PO TABS
550.0000 mg | ORAL_TABLET | Freq: Two times a day (BID) | ORAL | Status: DC
  Administered 2012-01-21 – 2012-01-27 (×14): 550 mg via ORAL
  Filled 2012-01-21 (×17): qty 1

## 2012-01-21 MED ORDER — INFLUENZA VIRUS VACC SPLIT PF IM SUSP
0.5000 mL | Freq: Once | INTRAMUSCULAR | Status: DC
  Filled 2012-01-21: qty 0.5

## 2012-01-21 MED ORDER — DEXTROSE 5 % IV SOLN
1.0000 g | INTRAVENOUS | Status: DC
  Administered 2012-01-21 – 2012-01-26 (×6): 1 g via INTRAVENOUS
  Filled 2012-01-21 (×7): qty 10

## 2012-01-21 MED ORDER — INSULIN ASPART 100 UNIT/ML ~~LOC~~ SOLN
0.0000 [IU] | SUBCUTANEOUS | Status: DC
  Administered 2012-01-21 – 2012-01-22 (×2): 4 [IU] via SUBCUTANEOUS
  Administered 2012-01-22 (×2): 7 [IU] via SUBCUTANEOUS
  Filled 2012-01-21: qty 3

## 2012-01-21 MED ORDER — SODIUM CHLORIDE 0.9 % IJ SOLN
3.0000 mL | Freq: Two times a day (BID) | INTRAMUSCULAR | Status: DC
  Administered 2012-01-21 – 2012-01-26 (×6): 3 mL via INTRAVENOUS

## 2012-01-21 MED ORDER — SODIUM CHLORIDE 0.9 % IJ SOLN
3.0000 mL | INTRAMUSCULAR | Status: DC | PRN
  Administered 2012-01-24: 3 mL via INTRAVENOUS

## 2012-01-21 MED ORDER — IOHEXOL 300 MG/ML  SOLN
100.0000 mL | Freq: Once | INTRAMUSCULAR | Status: AC | PRN
  Administered 2012-01-21: 100 mL via INTRAVENOUS

## 2012-01-21 MED ORDER — POTASSIUM CHLORIDE CRYS ER 20 MEQ PO TBCR
20.0000 meq | EXTENDED_RELEASE_TABLET | Freq: Two times a day (BID) | ORAL | Status: DC
  Administered 2012-01-21 – 2012-01-22 (×4): 20 meq via ORAL
  Filled 2012-01-21 (×5): qty 1

## 2012-01-21 MED ORDER — METOPROLOL SUCCINATE 12.5 MG HALF TABLET
12.5000 mg | ORAL_TABLET | Freq: Every day | ORAL | Status: DC
  Administered 2012-01-21 – 2012-01-27 (×7): 12.5 mg via ORAL
  Filled 2012-01-21 (×9): qty 1

## 2012-01-21 NOTE — ED Notes (Signed)
Patient transported to CT 

## 2012-01-21 NOTE — Progress Notes (Signed)
Subjective:   Requesting water/food.    Objective: Vital signs Filed Vitals:   01/21/12 0615 01/21/12 0700 01/21/12 0705 01/21/12 0759  BP: 118/83 104/73  97/67  Pulse: 112 116  113  Temp: 98.6 F (37 C) 98.8 F (37.1 C)  97.8 F (36.6 C)  TempSrc: Oral Oral  Oral  Resp: 20 20  22   Height:      Weight:   127.6 kg (281 lb 4.9 oz)   SpO2:    94%   Weight change:  Last BM Date: 01/21/12  Intake/Output from previous day: 02/22 0701 - 02/23 0700 In: 1518.3 [I.V.:1206.3; Blood:312] Out: 1 [Stool:1]     Physical Exam: General: Alert, awake, oriented x3, in no acute distress. Slightly pale HEENT: No bruits, no goiter. EOMI mucus mebranes mouth slightly dry/pink without petichiae Heart: Tachycardic without murmurs, rubs, gallops. Lungs: Normal effort Clear to auscultation bilaterally. Abdomen: Obese Soft, nontender, nondistended, Large bruise right side abdomen. Small amount of petechiae left axilla area Extremities: No clubbing cyanosis or edema with positive pedal pulses. Neuro: Grossly intact, nonfocal. Speech clear     Lab Results: Basic Metabolic Panel:  Basename 01/21/12 0918 01/20/12 2121  NA -- 128*  K -- 4.8  CL -- 90*  CO2 -- 27  GLUCOSE -- 145*  BUN -- 54*  CREATININE -- 1.56*  CALCIUM -- 8.8  MG 2.1 --  PHOS -- --   Liver Function Tests:  Basename 01/20/12 2121  AST 26  ALT 17  ALKPHOS 228*  BILITOT 2.0*  PROT 6.0  ALBUMIN 1.3*    Basename 01/20/12 2121  LIPASE 12  AMYLASE --    Basename 01/20/12 1958  AMMONIA 37   CBC:  Basename 01/21/12 0245 01/20/12 2121  WBC -- 22.1*  NEUTROABS -- 19.8*  HGB 12.9* 13.3  HCT 37.4* 38.8*  MCV -- 87.8  PLT -- 257   Cardiac Enzymes: No results found for this basename: CKTOTAL:3,CKMB:3,CKMBINDEX:3,TROPONINI:3 in the last 72 hours BNP: No results found for this basename: PROBNP:3 in the last 72 hours D-Dimer: No results found for this basename: DDIMER:2 in the last 72 hours CBG:  Basename  01/21/12 0701  GLUCAP 110*   Hemoglobin A1C: No results found for this basename: HGBA1C in the last 72 hours Fasting Lipid Panel: No results found for this basename: CHOL,HDL,LDLCALC,TRIG,CHOLHDL,LDLDIRECT in the last 72 hours Thyroid Function Tests: No results found for this basename: TSH,T4TOTAL,FREET4,T3FREE,THYROIDAB in the last 72 hours Anemia Panel: No results found for this basename: VITAMINB12,FOLATE,FERRITIN,TIBC,IRON,RETICCTPCT in the last 72 hours Coagulation:  Basename 01/20/12 2121  LABPROT 24.0*  INR 2.11*   Urine Drug Screen: Drugs of Abuse     Component Value Date/Time   LABOPIA NONE DETECTED 08/28/2011 0933   COCAINSCRNUR NONE DETECTED 08/28/2011 0933   LABBENZ NONE DETECTED 08/28/2011 0933   AMPHETMU NONE DETECTED 08/28/2011 0933   THCU NONE DETECTED 08/28/2011 0933   LABBARB NONE DETECTED 08/28/2011 0933    Alcohol Level: No results found for this basename: ETH:2 in the last 72 hours Urinalysis:  Basename 01/20/12 2016  COLORURINE AMBER*  LABSPEC 1.020  PHURINE 5.0  GLUCOSEU NEGATIVE  HGBUR NEGATIVE  BILIRUBINUR NEGATIVE  KETONESUR NEGATIVE  PROTEINUR NEGATIVE  UROBILINOGEN 1.0  NITRITE NEGATIVE  LEUKOCYTESUR MODERATE*   Misc. Labs:  No results found for this or any previous visit (from the past 240 hour(s)).  Studies/Results: Dg Chest 2 View  01/20/2012  *RADIOLOGY REPORT*  Clinical Data: Chest pain.  CHEST - 2 VIEW  Comparison: 01/04/2012  Findings: Left lower lobe airspace opacity noted.  Small left pleural effusion suspected.  No confluent opacity on the right. Heart is mildly enlarged.  No acute bony abnormality.  IMPRESSION: Mild cardiomegaly.  Left lower lobe atelectasis or consolidation with small left effusion.  Original Report Authenticated By: Cyndie Chime, M.D.   Ct Abdomen Pelvis W Contrast  01/21/2012  *RADIOLOGY REPORT*  Clinical Data: Abdominal pain.  History of cirrhosis.  CT ABDOMEN AND PELVIS WITH CONTRAST  Technique:   Multidetector CT imaging of the abdomen and pelvis was performed following the standard protocol during bolus administration of intravenous contrast.  Contrast: OMNIPAQUE IOHEXOL 300 MG/ML IV SOLN  Comparison: 05/28/2011  Findings: Small left pleural effusion with compressive atelectasis in the left lower lobe.  No effusion on the right.  Coronary artery calcifications are present.  Heart is normal size.  Small pericardial effusion.  Changes of cirrhosis.  The liver is shrunken and nodular.  Moderate ascites.  Spleen is enlarged.  There is a large left perinephric hematoma, possibly subcapsular. This measures 14 x 10 cm.  This compresses and displaces the left kidney.  Probable slight decreased perfusion of the left kidney relative to the right kidney.  The hematoma also extends into the left retroperitoneum more inferiorly along the anterior aspect of the iliopsoas muscle.  Locules of gas are noted within this component of the retroperitoneal hematoma of unknown etiology.  Large and small bowel grossly unremarkable.  Large spontaneous splenorenal shunt noted with dilated tortuous vessels extending into the pelvis.  Aorta is normal caliber.  Small bilateral nonobstructing renal stones.  No ureteral stones or hydronephrosis.  No acute bony abnormality.  IMPRESSION: Large left perinephric subcapsular hematoma with compression and displacement of the left kidney.  There is mild or early decrease in perfusion to the left kidney (page kidney).  The hematoma also extends into the left retroperitoneum.  Locules of gas are seen within the retroperitoneal hematoma in the left side of the pelvis of unknown etiology.  Changes of cirrhosis with moderate ascites, splenomegaly and large spontaneous splenorenal shunt.  Small left pleural effusion with compressive atelectasis in the left lower lobe.  Bilateral nephrolithiasis.  Critical Value/emergent results were called by telephone at the time of interpretation on  01/21/2012  at 12:45 a.m.  to  the ER physician at [facility], who verbally acknowledged these results.  Original Report Authenticated By: Cyndie Chime, M.D.    Medications: Scheduled Meds:   . sodium chloride   Intravenous Once  . calcitonin (salmon)  1 spray Alternating Nares q morning - 10a  . cefTAZidime (FORTAZ)  IV  2 g Intravenous Once  . cefTRIAXone (ROCEPHIN)  IV  1 g Intravenous Q24H  . docusate sodium  100 mg Oral BID  . influenza  inactive virus vaccine  0.5 mL Intramuscular Once  . insulin aspart  0-20 Units Subcutaneous Q4H  . lactulose  30 g Oral TID  . levothyroxine  50 mcg Oral Q0600  . metoprolol succinate  12.5 mg Oral Daily  .  morphine injection  4 mg Intravenous Once  . mulitivitamin with minerals  1 tablet Oral Daily  . nystatin  1 g Topical Daily  . ondansetron (ZOFRAN) IV  4 mg Intravenous Once  . pantoprazole  40 mg Oral Q1200  . phytonadione  10 mg Oral Once  . potassium chloride SA  20 mEq Oral BID  . rifaximin  550 mg Oral BID  . rOPINIRole  1  mg Oral QHS  . sodium chloride  3 mL Intravenous Q12H  . sodium chloride  3 mL Intravenous Q12H  . vancomycin  1,000 mg Intravenous Once  . DISCONTD: azithromycin  500 mg Intravenous Q24H  . DISCONTD: cefTRIAXone (ROCEPHIN)  IV  1 g Intravenous Q24H   Continuous Infusions:  PRN Meds:.sodium chloride, HYDROmorphone (DILAUDID) injection, iohexol, magic mouthwash, sodium chloride  Assessment/Plan:  Principal Problem: 1. *Retroperitoneal bleed: with large left perinephric hematoma and splenorenal shunt.   Likely secondary to liver disease. 2 units FFPin process  And status post Vit K. INR 2.11 on admission. Platelets 257. Will monitor cbc. Will request GI consult. If any indications of further bleeding may need embolization. SBP range 97-128. HR 103-110 Active Problems:  2. AODM: HgA1c pending. CBG's q 4hrs. SSI. Holding home basal insulin. Clear liquid diet. Monitor  3. OBSTRUCTIVE SLEEP APNEA:  CPAP  4. Essential hypertension, benign: controlled. SBP range 103-110. Continue BB. Monitor closely  5. Cirrhosis: See #1.  6. Morbid obesity: Nutritional consult  7.Anemia: acute bld loss as well as chronic disease: Hg stable 12.9. Will monitor q8hr or more often if pt. Condition inidcate.   8.Coagulopathy: secondary known liver disease. Vit K yesterday. 2 units FFP in process. Will monitor closely. See #1.   9.UTI (lower urinary tract infection): will get urine culture. Rocephin day #2.    LOS: 1 day   [facility] M 01/21/2012, 9:48 AM

## 2012-01-21 NOTE — Progress Notes (Signed)
Patient refuses to use nocturnal PPV tonight. He states he stopped using it at home and does not feel he needs it anymore. Family at bedside agrees. RN aware.

## 2012-01-21 NOTE — ED Notes (Signed)
Called to [address] [place] for report, receiving Nurse, Wynona Canes will call  Back asap.

## 2012-01-21 NOTE — H&P (Signed)
PCP:   REED,TIFFANY L., DO, DO   Chief Complaint: Abdominal pain.   HPI: [name] is an 61 y.o. 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 He did not recall falling (he's usually bedbound), no black stool bloody stool, nausea, vomiting, chest pain, shortness of breath, or any other symptomology. Evaluation in emergency room included a urinalysis positive for UTI, and elevated white count 22,000 with hemoglobin of 13.3 g per decaliter. He has unremarkable liver function tests, but his INR is elevated at 2.1. His lites spaces normal. His chest x-ray shows questionable left consolidation. He denied any pulmonary symptoms. Hospitalist was asked to admit him for UTI, pneumonia, and possible peritonitis.  Rewiew of Systems:  The patient denies anorexia, fever, weight loss,, vision loss, decreased hearing, hoarseness, chest pain, syncope, dyspnea on exertion, peripheral edema, balance deficits, hemoptysis, abdominal pain, melena, hematochezia, severe indigestion/heartburn, hematuria, incontinence, genital sores, muscle weakness, suspicious skin lesions, transient blindness, difficulty walking, depression, unusual weight change, abnormal bleeding, enlarged lymph nodes, angioedema, and breast masses.    Past Medical History  Diagnosis Date  . Hypertension   . Type II or unspecified type diabetes mellitus without mention of complication, not stated as uncontrolled   . Thyroid disease   . Cirrhosis, non-alcoholic   . Morbid obesity   . Pancytopenia   . Fatty liver   . Coronary artery disease   . Myocardial infarction     "HEART INCIDENT " OCT 2012  . Angina     RARE USE OF NTG  . CHF (congestive heart failure)     JUNE 2012  . Arthritis   .  Gout 35 YRS AGO  . Cellulitis of left leg   . Cirrhosis of liver     NONALCOHOLIC  . Nocturia   . Elevated BUN   . Elevated serum creatinine   . Serum ammonia increased   . Splenomegaly   . Platelets decreased   . Thyroid disorder     PT DOES NOT KNOW IF LOW OR HIGH  . Chronic kidney disease (CKD), stage III (moderate)   . Obstructive sleep apnea   . Hyponatremia   . Hyperkalemia   . Heart murmur   . Back pain     has compression fx    Past Surgical History  Procedure Date  . Coronary stent placement     2 CARDIAC STENTS OCT 08/2011  . Total hip arthroplasty 10/14/2011    Procedure: TOTAL HIP ARTHROPLASTY ANTERIOR APPROACH;  Surgeon: Kathryne Hitch;  Location: [facility];  Service: Orthopedics;  Laterality: Right;  . Joint replacement   . Appendectomy 1968  . Tonsillectomy 1957  . Cardiac catheterization 09/08/2011    Medications:  HOME MEDS: Prior to Admission medications   Medication Sig Start Date End Date Taking? Authorizing Provider  Alum & Mag Hydroxide-Simeth (MAGIC MOUTHWASH) SOLN Swish and spit 5 mLs 4 (four) times daily as needed.   Yes Historical Provider, MD  aspirin 81 MG chewable tablet Chew 81 mg by mouth daily.   Yes Historical Provider, MD  calcitonin, salmon, (MIACALCIN/FORTICAL) 200 UNIT/ACT nasal spray Place 1 spray into the nose See admin instructions. 200 units spone spray Alternate nostrils every day   Yes Historical Provider, MD  docusate sodium (COLACE) 100 MG capsule Take 100 mg by mouth  2 (two) times daily.     Yes Historical Provider, MD  furosemide (LASIX) 80 MG tablet Take 120 mg by mouth 2 (two) times daily. 01/11/12  Yes Rob Bunting, MD  insulin aspart (NOVOLOG) 100 UNIT/ML injection Inject 6 Units into the skin 3 (three) times daily with meals. As directed per sliding scale 10/31/11 10/30/12 Yes Novlet Wandra Mannan, MD  insulin detemir (LEVEMIR) 100 UNIT/ML injection Inject 44 Units into the skin at bedtime. 12/12/11 12/11/12 Yes Michelle  A. Ashley Royalty, MD  lactulose (CHRONULAC) 10 GM/15ML solution Take 45 mLs (30 g total) by mouth 3 (three) times daily. 01/11/12  Yes Rob Bunting, MD  levothyroxine (SYNTHROID, LEVOTHROID) 50 MCG tablet Take 50 mcg by mouth every morning.    Yes Historical Provider, MD  lidocaine (LIDODERM) 5 % Place 1 patch onto the skin daily. Remove & Discard patch within 12 hours or as directed by MD   Yes Historical Provider, MD  metoprolol succinate (TOPROL-XL) 25 MG 24 hr tablet 12.5 mg daily.    Yes Historical Provider, MD  Multiple Vitamin (MULITIVITAMIN WITH MINERALS) TABS Take 1 tablet by mouth daily.   Yes Historical Provider, MD  nitroGLYCERIN (NITROSTAT) 0.4 MG SL tablet Place 0.4 mg under the tongue every 5 (five) minutes as needed. CHEST PAIN   Yes Historical Provider, MD  nystatin (NYSTOP) 100000 UNIT/GM POWD Apply 1 g topically daily. topically 10/31/11  Yes Novlet Wandra Mannan, MD  pantoprazole (PROTONIX) 40 MG tablet Take 40 mg by mouth daily at 12 noon.   10/31/11 10/30/12 Yes Novlet Wandra Mannan, MD  potassium chloride SA (K-DUR,KLOR-CON) 20 MEQ tablet Take 1 tablet (20 mEq total) by mouth 2 (two) times daily. 01/06/12 01/05/13 Yes Pleas Koch, MD  rifaximin (XIFAXAN) 550 MG TABS Take 550 mg by mouth 2 (two) times daily. 11/23/11  Yes Rob Bunting, MD  rOPINIRole (REQUIP) 1 MG tablet Take 1 mg by mouth at bedtime.   Yes Historical Provider, MD     Allergies:  Allergies  Allergen Reactions  . Crestor (Rosuvastatin Calcium)     unknown  . Statins Other (See Comments)    PT DOES NOT REMEMBER    Social History:   reports that he has never smoked. He has never used smokeless tobacco. He reports that he does not drink alcohol or use illicit drugs.  Family History: Family History  Problem Relation Age of Onset  . Colon cancer Neg Hx   . Diabetes Mother   . Diabetes Father      Physical Exam: Filed Vitals:   01/20/12 1824 01/20/12 1843 01/20/12 2355 01/21/12 0154  BP:  111/77 122/106  126/87  Pulse:  113 103 103  Temp:  97.8 F (36.6 C) 98 F (36.7 C)   TempSrc:  Oral Oral Oral  Resp:  20 18 20   Height:    5\' 10"  (1.778 m)  Weight:    128 kg (282 lb 3 oz)  SpO2: 98% 93% 94% 95%   Blood pressure 126/87, pulse 103, temperature 98 F (36.7 C), temperature source Oral, resp. rate 20, height 5\' 10"  (1.778 m), weight 128 kg (282 lb 3 oz), SpO2 95.00%.  GEN:  Pleasant but pale  person lying in the stretcher in no acute distress; cooperative with exam. He does appear jaundiced PSYCH:  alert and oriented x4; does not appear anxious does not appear depressed; affect is normal HEENT: Mucous membranes pink and anicteric; PERRLA; EOM intact; no cervical lymphadenopathy nor thyromegaly or carotid bruit; no JVD;  Breasts:: Not examined CHEST WALL: No tenderness CHEST: Normal respiration, clear to auscultation bilaterally HEART: Regular rate and rhythm; no murmurs rubs or gallops BACK: No kyphosis or scoliosis; no CVA tenderness ABDOMEN: Obese, soft non-tender; no masses, no organomegaly, normal abdominal bowel sounds; no pannus; no intertriginous candida. He has no tense ascites. He is large contusion in his abdomen. He has a small amount of petechiae in his left upper axilla area. The area of contusion tract down to his right leg as well. He has no asterixis Rectal Exam: Not done EXTREMITIES: No bone or joint deformity; age-appropriate arthropathy of the hands and knees; no edema; no ulcerations. Genitalia: not examined PULSES: 2+ and symmetric SKIN: Normal hydration no rash or ulceration CNS: Cranial nerves 2-12 grossly intact no focal neurologic deficit   Labs & Imaging Results for orders placed during the hospital encounter of 01/20/12 (from the past 48 hour(s))  AMMONIA     Status: Normal   Collection Time   01/20/12  7:58 PM      Component Value Range Comment   Ammonia 37  11 - 60 (umol/L)   URINALYSIS, ROUTINE W REFLEX MICROSCOPIC     Status: Abnormal   Collection Time    01/20/12  8:16 PM      Component Value Range Comment   Color, Urine AMBER (*) YELLOW  BIOCHEMICALS MAY BE AFFECTED BY COLOR   APPearance CLOUDY (*) CLEAR     Specific Gravity, Urine 1.020  1.005 - 1.030     pH 5.0  5.0 - 8.0     Glucose, UA NEGATIVE  NEGATIVE (mg/dL)    Hgb urine dipstick NEGATIVE  NEGATIVE     Bilirubin Urine NEGATIVE  NEGATIVE     Ketones, ur NEGATIVE  NEGATIVE (mg/dL)    Protein, ur NEGATIVE  NEGATIVE (mg/dL)    Urobilinogen, UA 1.0  0.0 - 1.0 (mg/dL)    Nitrite NEGATIVE  NEGATIVE     Leukocytes, UA MODERATE (*) NEGATIVE    URINE MICROSCOPIC-ADD ON     Status: Abnormal   Collection Time   01/20/12  8:16 PM      Component Value Range Comment   WBC, UA 7-10  <3 (WBC/hpf)    Bacteria, UA MANY (*) RARE     Casts HYALINE CASTS (*) NEGATIVE    SAMPLE TO BLOOD BANK     Status: Normal   Collection Time   01/20/12  9:05 PM      Component Value Range Comment   Blood Bank Specimen SAMPLE AVAILABLE FOR TESTING      Sample Expiration 01/23/2012     CBC     Status: Abnormal   Collection Time   01/20/12  9:21 PM      Component Value Range Comment   WBC 22.1 (*) 4.0 - 10.5 (K/uL)    RBC 4.42  4.22 - 5.81 (MIL/uL)    Hemoglobin 13.3  13.0 - 17.0 (g/dL)    HCT 16.1 (*) 09.6 - 52.0 (%)    MCV 87.8  78.0 - 100.0 (fL)    MCH 30.1  26.0 - 34.0 (pg)    MCHC 34.3  30.0 - 36.0 (g/dL)    RDW 04.5 (*) 40.9 - 15.5 (%)    Platelets 257  150 - 400 (K/uL)   DIFFERENTIAL     Status: Abnormal   Collection Time   01/20/12  9:21 PM      Component Value Range Comment   Neutrophils Relative 90 (*) 43 -  77 (%)    Neutro Abs 19.8 (*) 1.7 - 7.7 (K/uL)    Lymphocytes Relative 6 (*) 12 - 46 (%)    Lymphs Abs 1.3  0.7 - 4.0 (K/uL)    Monocytes Relative 4  3 - 12 (%)    Monocytes Absolute 1.0  0.1 - 1.0 (K/uL)    Eosinophils Relative 0  0 - 5 (%)    Eosinophils Absolute 0.0  0.0 - 0.7 (K/uL)    Basophils Relative 0  0 - 1 (%)    Basophils Absolute 0.0  0.0 - 0.1 (K/uL)   COMPREHENSIVE  METABOLIC PANEL     Status: Abnormal   Collection Time   01/20/12  9:21 PM      Component Value Range Comment   Sodium 128 (*) 135 - 145 (mEq/L)    Potassium 4.8  3.5 - 5.1 (mEq/L)    Chloride 90 (*) 96 - 112 (mEq/L)    CO2 27  19 - 32 (mEq/L)    Glucose, Bld 145 (*) 70 - 99 (mg/dL)    BUN 54 (*) 6 - 23 (mg/dL)    Creatinine, Ser 3.08 (*) 0.50 - 1.35 (mg/dL)    Calcium 8.8  8.4 - 10.5 (mg/dL)    Total Protein 6.0  6.0 - 8.3 (g/dL)    Albumin 1.3 (*) 3.5 - 5.2 (g/dL)    AST 26  0 - 37 (U/L)    ALT 17  0 - 53 (U/L)    Alkaline Phosphatase 228 (*) 39 - 117 (U/L)    Total Bilirubin 2.0 (*) 0.3 - 1.2 (mg/dL)    GFR calc non Af Amer 46 (*) >90 (mL/min)    GFR calc Af Amer 54 (*) >90 (mL/min)   PROTIME-INR     Status: Abnormal   Collection Time   01/20/12  9:21 PM      Component Value Range Comment   Prothrombin Time 24.0 (*) 11.6 - 15.2 (seconds)    INR 2.11 (*) 0.00 - 1.49    APTT     Status: Abnormal   Collection Time   01/20/12  9:21 PM      Component Value Range Comment   aPTT 39 (*) 24 - 37 (seconds)   LIPASE, BLOOD     Status: Normal   Collection Time   01/20/12  9:21 PM      Component Value Range Comment   Lipase 12  11 - 59 (U/L)   PREPARE FRESH FROZEN PLASMA     Status: Normal (Preliminary result)   Collection Time   01/21/12 12:00 AM      Component Value Range Comment   Unit Number [id number]      Blood Component Type THAWED PLASMA      Unit division 00      Status of Unit ALLOCATED      Transfusion Status OK TO TRANSFUSE      Unit Number [id number]      Blood Component Type THAWED PLASMA      Unit division 00      Status of Unit ALLOCATED      Transfusion Status OK TO TRANSFUSE     HEMOGLOBIN AND HEMATOCRIT, BLOOD     Status: Abnormal   Collection Time   01/21/12  2:45 AM      Component Value Range Comment   Hemoglobin 12.9 (*) 13.0 - 17.0 (g/dL)    HCT 40.1 (*) 02.7 - 52.0 (%)    Dg Chest 2 View  01/20/2012  *RADIOLOGY REPORT*  Clinical Data: Chest pain.  CHEST  - 2 VIEW  Comparison: 01/04/2012  Findings: Left lower lobe airspace opacity noted.  Small left pleural effusion suspected.  No confluent opacity on the right. Heart is mildly enlarged.  No acute bony abnormality.  IMPRESSION: Mild cardiomegaly.  Left lower lobe atelectasis or consolidation with small left effusion.  Original Report Authenticated By: Cyndie Chime, M.D.   Ct Abdomen Pelvis W Contrast  01/21/2012  *RADIOLOGY REPORT*  Clinical Data: Abdominal pain.  History of cirrhosis.  CT ABDOMEN AND PELVIS WITH CONTRAST  Technique:  Multidetector CT imaging of the abdomen and pelvis was performed following the standard protocol during bolus administration of intravenous contrast.  Contrast: OMNIPAQUE IOHEXOL 300 MG/ML IV SOLN  Comparison: 05/28/2011  Findings: Small left pleural effusion with compressive atelectasis in the left lower lobe.  No effusion on the right.  Coronary artery calcifications are present.  Heart is normal size.  Small pericardial effusion.  Changes of cirrhosis.  The liver is shrunken and nodular.  Moderate ascites.  Spleen is enlarged.  There is a large left perinephric hematoma, possibly subcapsular. This measures 14 x 10 cm.  This compresses and displaces the left kidney.  Probable slight decreased perfusion of the left kidney relative to the right kidney.  The hematoma also extends into the left retroperitoneum more inferiorly along the anterior aspect of the iliopsoas muscle.  Locules of gas are noted within this component of the retroperitoneal hematoma of unknown etiology.  Large and small bowel grossly unremarkable.  Large spontaneous splenorenal shunt noted with dilated tortuous vessels extending into the pelvis.  Aorta is normal caliber.  Small bilateral nonobstructing renal stones.  No ureteral stones or hydronephrosis.  No acute bony abnormality.  IMPRESSION: Large left perinephric subcapsular hematoma with compression and displacement of the left kidney.  There is mild  or early decrease in perfusion to the left kidney (page kidney).  The hematoma also extends into the left retroperitoneum.  Locules of gas are seen within the retroperitoneal hematoma in the left side of the pelvis of unknown etiology.  Changes of cirrhosis with moderate ascites, splenomegaly and large spontaneous splenorenal shunt.  Small left pleural effusion with compressive atelectasis in the left lower lobe.  Bilateral nephrolithiasis.  Critical Value/emergent results were called by telephone at the time of interpretation on 01/21/2012  at 12:45 a.m.  to  the ER physician at [facility], who verbally acknowledged these results.  Original Report Authenticated By: Cyndie Chime, M.D.      Assessment Present on Admission:  .Retroperitoneal bleed .Cirrhosis .Coagulopathy .Anemia .AODM .Morbid obesity .OBSTRUCTIVE SLEEP APNEA .Essential hypertension, benign .UTI (lower urinary tract infection)   PLAN: I do not think he has peritonitis nor pneumonia. I was concerned about retroperitoneal bleed which the CT scan subsequently confirmed a large left perinephric subcapsular hematoma with compression and displacement of the left kidney. He has moderate ascites, splenomegaly, and large spontaneous splenorenal shunt. He also has a small left pleural effusion and bilateral nephrolithiasis. There were locules gas seen within the retroperitoneal hematoma in the left side the pelvis of unknown etiology. Urology was consulted who recommended conservative treatment. We'll admit him to telemetry, give vitamin K and fresh frozen, follow his crit carefully. He would need transfusion as needed and fresh frozen every 4-6 hours. He will be made n.p.o. in case interventional radiology need to do embolization if he continued to bleed. His basal insulin will be held,  and he'll be covered with insulin sliding scale. For his UTI, will treat with Rocephin alone. He does have obstructive sleep apnea as well, and  will order CPAP each bedtime. I spent quite some time explaining what has been called on with him and his daughter. Although he is stable, he he has serious disease, and his long-term prognosis is not great. He is a full code, and will be [facility].   Other plans as per orders.    Mairely Foxworth 01/21/2012, 3:42 AM

## 2012-01-21 NOTE — Progress Notes (Signed)
Pt alert x 3, slow to respond family at bedside. No c/o pain, v/s stable, placed on teley box number 8. Pt skin is dry, stage 2  dime size skin tear noted to lf buttock. Restore placed, no odor noted. Bil pitting edema noted to upper and lower extremities. Scrotal edema noted. Generalized petechia noted, some discoloration noted to the Pt's abd with generalized bruising noted to upper and lower extremites.

## 2012-01-21 NOTE — Progress Notes (Signed)
The pt had a five beat run of VT,  Showing no S/S, VS stable , Lenny Pastel NP paged. AM Mg lab added will monitor.

## 2012-01-21 NOTE — ED Provider Notes (Signed)
Results for orders placed during the hospital encounter of 01/20/12  CBC      Component Value Range   WBC 22.1 (*) 4.0 - 10.5 (K/uL)   RBC 4.42  4.22 - 5.81 (MIL/uL)   Hemoglobin 13.3  13.0 - 17.0 (g/dL)   HCT 13.0 (*) 86.5 - 52.0 (%)   MCV 87.8  78.0 - 100.0 (fL)   MCH 30.1  26.0 - 34.0 (pg)   MCHC 34.3  30.0 - 36.0 (g/dL)   RDW 78.4 (*) 69.6 - 15.5 (%)   Platelets 257  150 - 400 (K/uL)  DIFFERENTIAL      Component Value Range   Neutrophils Relative 90 (*) 43 - 77 (%)   Neutro Abs 19.8 (*) 1.7 - 7.7 (K/uL)   Lymphocytes Relative 6 (*) 12 - 46 (%)   Lymphs Abs 1.3  0.7 - 4.0 (K/uL)   Monocytes Relative 4  3 - 12 (%)   Monocytes Absolute 1.0  0.1 - 1.0 (K/uL)   Eosinophils Relative 0  0 - 5 (%)   Eosinophils Absolute 0.0  0.0 - 0.7 (K/uL)   Basophils Relative 0  0 - 1 (%)   Basophils Absolute 0.0  0.0 - 0.1 (K/uL)  COMPREHENSIVE METABOLIC PANEL      Component Value Range   Sodium 128 (*) 135 - 145 (mEq/L)   Potassium 4.8  3.5 - 5.1 (mEq/L)   Chloride 90 (*) 96 - 112 (mEq/L)   CO2 27  19 - 32 (mEq/L)   Glucose, Bld 145 (*) 70 - 99 (mg/dL)   BUN 54 (*) 6 - 23 (mg/dL)   Creatinine, Ser 2.95 (*) 0.50 - 1.35 (mg/dL)   Calcium 8.8  8.4 - 28.4 (mg/dL)   Total Protein 6.0  6.0 - 8.3 (g/dL)   Albumin 1.3 (*) 3.5 - 5.2 (g/dL)   AST 26  0 - 37 (U/L)   ALT 17  0 - 53 (U/L)   Alkaline Phosphatase 228 (*) 39 - 117 (U/L)   Total Bilirubin 2.0 (*) 0.3 - 1.2 (mg/dL)   GFR calc non Af Amer 46 (*) >90 (mL/min)   GFR calc Af Amer 54 (*) >90 (mL/min)  PROTIME-INR      Component Value Range   Prothrombin Time 24.0 (*) 11.6 - 15.2 (seconds)   INR 2.11 (*) 0.00 - 1.49   APTT      Component Value Range   aPTT 39 (*) 24 - 37 (seconds)  URINALYSIS, ROUTINE W REFLEX MICROSCOPIC      Component Value Range   Color, Urine AMBER (*) YELLOW    APPearance CLOUDY (*) CLEAR    Specific Gravity, Urine 1.020  1.005 - 1.030    pH 5.0  5.0 - 8.0    Glucose, UA NEGATIVE  NEGATIVE (mg/dL)   Hgb urine  dipstick NEGATIVE  NEGATIVE    Bilirubin Urine NEGATIVE  NEGATIVE    Ketones, ur NEGATIVE  NEGATIVE (mg/dL)   Protein, ur NEGATIVE  NEGATIVE (mg/dL)   Urobilinogen, UA 1.0  0.0 - 1.0 (mg/dL)   Nitrite NEGATIVE  NEGATIVE    Leukocytes, UA MODERATE (*) NEGATIVE   AMMONIA      Component Value Range   Ammonia 37  11 - 60 (umol/L)  SAMPLE TO BLOOD BANK      Component Value Range   Blood Bank Specimen SAMPLE AVAILABLE FOR TESTING     Sample Expiration 01/23/2012    LIPASE, BLOOD      Component Value Range  Lipase 12  11 - 59 (U/L)  URINE MICROSCOPIC-ADD ON      Component Value Range   WBC, UA 7-10  <3 (WBC/hpf)   Bacteria, UA MANY (*) RARE    Casts HYALINE CASTS (*) NEGATIVE    Dg Chest 2 View  01/20/2012  *RADIOLOGY REPORT*  Clinical Data: Chest pain.  CHEST - 2 VIEW  Comparison: 01/04/2012  Findings: Left lower lobe airspace opacity noted.  Small left pleural effusion suspected.  No confluent opacity on the right. Heart is mildly enlarged.  No acute bony abnormality.  IMPRESSION: Mild cardiomegaly.  Left lower lobe atelectasis or consolidation with small left effusion.  Original Report Authenticated By: Cyndie Chime, M.D.   Dg Chest 2 View  01/04/2012  *RADIOLOGY REPORT*  Clinical Data: Cirrhosis.  Pain.  CHEST - 2 VIEW  Comparison: 12/14/2011  Findings: Lung volumes are low. The cardiopericardial silhouette is enlarged. There is pulmonary vascular congestion without overt pulmonary edema.  No focal airspace consolidation or overt airspace edema.  No pleural effusion. Imaged bony structures of the thorax are intact.  IMPRESSION: Stable.  No acute findings.  Original Report Authenticated By: ERIC A. MANSELL, M.D.   Ct Head Wo Contrast  01/04/2012  *RADIOLOGY REPORT*  Clinical Data: Altered mental status  CT HEAD WITHOUT CONTRAST  Technique:  Contiguous axial images were obtained from the base of the skull through the vertex without contrast.  Comparison: 116 01/18/2012  Findings:  Atherosclerotic and physiologic intracranial calcifications. There is no evidence of acute intracranial hemorrhage, brain edema, mass lesion, acute infarction,   mass effect, or midline shift. Acute infarct may be inapparent on noncontrast CT.  No other intra-axial abnormalities are seen, and the ventricles and sulci are within normal limits in size and symmetry.   No abnormal extra-axial fluid collections or masses are identified.  No significant calvarial abnormality.  IMPRESSION: 1. Negative for bleed or other acute intracranial process.  Original Report Authenticated By: Osa Craver, M.D.   Ct Abdomen Pelvis W Contrast  01/21/2012  *RADIOLOGY REPORT*  Clinical Data: Abdominal pain.  History of cirrhosis.  CT ABDOMEN AND PELVIS WITH CONTRAST  Technique:  Multidetector CT imaging of the abdomen and pelvis was performed following the standard protocol during bolus administration of intravenous contrast.  Contrast: OMNIPAQUE IOHEXOL 300 MG/ML IV SOLN  Comparison: 05/28/2011  Findings: Small left pleural effusion with compressive atelectasis in the left lower lobe.  No effusion on the right.  Coronary artery calcifications are present.  Heart is normal size.  Small pericardial effusion.  Changes of cirrhosis.  The liver is shrunken and nodular.  Moderate ascites.  Spleen is enlarged.  There is a large left perinephric hematoma, possibly subcapsular. This measures 14 x 10 cm.  This compresses and displaces the left kidney.  Probable slight decreased perfusion of the left kidney relative to the right kidney.  The hematoma also extends into the left retroperitoneum more inferiorly along the anterior aspect of the iliopsoas muscle.  Locules of gas are noted within this component of the retroperitoneal hematoma of unknown etiology.  Large and small bowel grossly unremarkable.  Large spontaneous splenorenal shunt noted with dilated tortuous vessels extending into the pelvis.  Aorta is normal caliber.   Small bilateral nonobstructing renal stones.  No ureteral stones or hydronephrosis.  No acute bony abnormality.  IMPRESSION: Large left perinephric subcapsular hematoma with compression and displacement of the left kidney.  There is mild or early decrease in perfusion to  the left kidney (page kidney).  The hematoma also extends into the left retroperitoneum.  Locules of gas are seen within the retroperitoneal hematoma in the left side of the pelvis of unknown etiology.  Changes of cirrhosis with moderate ascites, splenomegaly and large spontaneous splenorenal shunt.  Small left pleural effusion with compressive atelectasis in the left lower lobe.  Bilateral nephrolithiasis.  Critical Value/emergent results were called by telephone at the time of interpretation on 01/21/2012  at 12:45 a.m.  to  the ER physician at [facility], who verbally acknowledged these results.  Original Report Authenticated By: Cyndie Chime, M.D.    0045: notified Dr. Conley Rolls with [facility] regarding CT findings.  Pt has been started on FFP.  Will consult urology.  [facility] has [facility]  0117: spoke with Dr. Annabell Howells with urology who advised medical management at this time.  Follow H/H, may need embolization by IR  Rolan Bucco, MD 01/21/12 [phone]

## 2012-01-22 ENCOUNTER — Inpatient Hospital Stay (HOSPITAL_COMMUNITY): Payer: 59

## 2012-01-22 LAB — GLUCOSE, CAPILLARY
Glucose-Capillary: 187 mg/dL — ABNORMAL HIGH (ref 70–99)
Glucose-Capillary: 262 mg/dL — ABNORMAL HIGH (ref 70–99)

## 2012-01-22 LAB — BASIC METABOLIC PANEL
GFR calc non Af Amer: 41 mL/min — ABNORMAL LOW (ref >90)
Glucose, Bld: 211 mg/dL — ABNORMAL HIGH (ref 70–99)
Potassium: 5.6 mEq/L — ABNORMAL HIGH (ref 3.5–5.1)
Sodium: 129 mEq/L — ABNORMAL LOW (ref 135–145)

## 2012-01-22 LAB — PROTIME-INR
INR: 2.14 — ABNORMAL HIGH (ref 0.00–1.49)
Prothrombin Time: 24.3 seconds — ABNORMAL HIGH (ref 11.6–15.2)

## 2012-01-22 LAB — HEMOGLOBIN AND HEMATOCRIT, BLOOD
HCT: 33.5 % — ABNORMAL LOW (ref 39.0–52.0)
Hemoglobin: 11.6 g/dL — ABNORMAL LOW (ref 13.0–17.0)

## 2012-01-22 MED ORDER — INSULIN ASPART 100 UNIT/ML ~~LOC~~ SOLN
0.0000 [IU] | Freq: Every day | SUBCUTANEOUS | Status: DC
  Administered 2012-01-23 – 2012-01-24 (×2): 3 [IU] via SUBCUTANEOUS
  Administered 2012-01-26: 2 [IU] via SUBCUTANEOUS
  Filled 2012-01-22: qty 3

## 2012-01-22 MED ORDER — SODIUM CHLORIDE 0.9 % IJ SOLN
10.0000 mL | INTRAMUSCULAR | Status: DC | PRN
  Administered 2012-01-23 – 2012-01-27 (×8): 10 mL

## 2012-01-22 MED ORDER — SODIUM CHLORIDE 0.9 % IJ SOLN
10.0000 mL | Freq: Two times a day (BID) | INTRAMUSCULAR | Status: DC
Start: 2012-01-22 — End: 2012-01-27
  Administered 2012-01-24 – 2012-01-27 (×4): 10 mL

## 2012-01-22 MED ORDER — INSULIN ASPART 100 UNIT/ML ~~LOC~~ SOLN
0.0000 [IU] | Freq: Three times a day (TID) | SUBCUTANEOUS | Status: DC
  Administered 2012-01-23: 4 [IU] via SUBCUTANEOUS
  Administered 2012-01-23 (×2): 7 [IU] via SUBCUTANEOUS
  Administered 2012-01-24: 4 [IU] via SUBCUTANEOUS
  Administered 2012-01-24: 7 [IU] via SUBCUTANEOUS
  Administered 2012-01-25 (×2): 4 [IU] via SUBCUTANEOUS
  Administered 2012-01-25: 3 [IU] via SUBCUTANEOUS
  Administered 2012-01-26: 4 [IU] via SUBCUTANEOUS
  Administered 2012-01-26: 7 [IU] via SUBCUTANEOUS
  Administered 2012-01-26 – 2012-01-27 (×2): 4 [IU] via SUBCUTANEOUS
  Administered 2012-01-27: 7 [IU] via SUBCUTANEOUS
  Administered 2012-01-27: 4 [IU] via SUBCUTANEOUS
  Filled 2012-01-22 (×2): qty 3

## 2012-01-22 NOTE — Progress Notes (Signed)
Patient continues to refuse nocturnal PPV.  

## 2012-01-22 NOTE — Progress Notes (Signed)
Pt awake and alert. Hb remains stable. Examined and assessed and discussed with NP Toya Smothers.Marland Kitchen agreee with assessment and plan. Will advance diet.

## 2012-01-22 NOTE — Progress Notes (Signed)
Subjective: "I did not sleep well last night" Family reports restlessness and anxiety over pain associated with repositioning and clean up.   Objective: Vital signs Filed Vitals:   01/21/12 1202 01/21/12 1414 01/21/12 2225 01/22/12 0702  BP: 96/68  108/67 99/70  Pulse: 112 108 118 108  Temp: 98 F (36.7 C) 97.8 F (36.6 C) 98.4 F (36.9 C) 97.4 F (36.3 C)  TempSrc: Axillary Oral Oral Oral  Resp: 20 18 16 16   Height:      Weight:    131 kg (288 lb 12.8 oz)  SpO2:  95% 93% 93%   Weight change: -0.4 kg (-14.1 oz) Last BM Date: 01/22/12  Intake/Output from previous day: 02/23 0701 - 02/24 0700 In: 762 [P.O.:700; Blood:12; IV Piggyback:50] Out: 351 [Urine:350; Stool:1] Total I/O In: -  Out: 401 [Urine:400; Stool:1]   Physical Exam: General: Lethargic but easily aroused,, oriented x3, in no acute distress. HEENT: No bruits, no goiter. PERRL EOMI, Mucus membranes mouth pink without petichiae Heart:Tachycardic but regular rhythm, without murmurs, rubs, gallops. Lungs:  Normal effort but somewhat shallow. Breath sounds clear to auscultation bilaterally. Abdomen:Obese Soft, nontender, nondistended, positive bowel sounds. Large bruise right to mid abdomen. Small amount petechiae axilla area Extremities: No clubbing cyanosis with positive pedal pulses. Trace-1+LEE Neuro: Grossly intact, nonfocal. Speech clear but slow.     Lab Results: Basic Metabolic Panel:  Basename 01/22/12 0815 01/21/12 0918 01/20/12 2121  NA 129* -- 128*  K 5.6* -- 4.8  CL 91* -- 90*  CO2 27 -- 27  GLUCOSE 211* -- 145*  BUN 66* -- 54*  CREATININE 1.74* -- 1.56*  CALCIUM 8.8 -- 8.8  MG -- 2.1 --  PHOS -- -- --   Liver Function Tests:  Basename 01/20/12 2121  AST 26  ALT 17  ALKPHOS 228*  BILITOT 2.0*  PROT 6.0  ALBUMIN 1.3*    Basename 01/20/12 2121  LIPASE 12  AMYLASE --    Basename 01/21/12 1545 01/20/12 1958  AMMONIA 22 37   CBC:  Basename 01/22/12 0815 01/22/12 0300 01/21/12  1545 01/20/12 2121  WBC -- -- 15.3* 22.1*  NEUTROABS -- -- -- 19.8*  HGB 11.6* 11.2* -- --  HCT 34.7* 33.5* -- --  MCV -- -- 88.9 87.8  PLT -- -- 161 257   Cardiac Enzymes: No results found for this basename: CKTOTAL:3,CKMB:3,CKMBINDEX:3,TROPONINI:3 in the last 72 hours BNP: No results found for this basename: PROBNP:3 in the last 72 hours D-Dimer: No results found for this basename: DDIMER:2 in the last 72 hours CBG:  Basename 01/22/12 0721 01/21/12 2242 01/21/12 1651 01/21/12 1151 01/21/12 0701  GLUCAP 187* 200* 176* 101* 110*   Hemoglobin A1C:  Basename 01/21/12 0245  HGBA1C 5.5   Fasting Lipid Panel: No results found for this basename: CHOL,HDL,LDLCALC,TRIG,CHOLHDL,LDLDIRECT in the last 72 hours Thyroid Function Tests: No results found for this basename: TSH,T4TOTAL,FREET4,T3FREE,THYROIDAB in the last 72 hours Anemia Panel: No results found for this basename: VITAMINB12,FOLATE,FERRITIN,TIBC,IRON,RETICCTPCT in the last 72 hours Coagulation:  Basename 01/22/12 0300 01/21/12 1830  LABPROT 24.3* 24.4*  INR 2.14* 2.15*   Urine Drug Screen: Drugs of Abuse     Component Value Date/Time   LABOPIA NONE DETECTED 08/28/2011 0933   COCAINSCRNUR NONE DETECTED 08/28/2011 0933   LABBENZ NONE DETECTED 08/28/2011 0933   AMPHETMU NONE DETECTED 08/28/2011 0933   THCU NONE DETECTED 08/28/2011 0933   LABBARB NONE DETECTED 08/28/2011 0933    Alcohol Level: No results found for this basename: ETH:2 in  the last 72 hours Urinalysis:  Basename 01/20/12 2016  COLORURINE AMBER*  LABSPEC 1.020  PHURINE 5.0  GLUCOSEU NEGATIVE  HGBUR NEGATIVE  BILIRUBINUR NEGATIVE  KETONESUR NEGATIVE  PROTEINUR NEGATIVE  UROBILINOGEN 1.0  NITRITE NEGATIVE  LEUKOCYTESUR MODERATE*   Misc. Labs:  Recent Results (from the past 240 hour(s))  URINE CULTURE     Status: Normal (Preliminary result)   Collection Time   01/20/12  8:16 PM      Component Value Range Status Comment   Specimen Description  URINE, CATHETERIZED   Final    Special Requests NONE   Final    Culture  Setup Time [id number]   Final    Colony Count PENDING   Incomplete    Culture Culture reincubated for better growth   Final    Report Status PENDING   Incomplete     Studies/Results: Dg Chest 2 View  01/20/2012  *RADIOLOGY REPORT*  Clinical Data: Chest pain.  CHEST - 2 VIEW  Comparison: 01/04/2012  Findings: Left lower lobe airspace opacity noted.  Small left pleural effusion suspected.  No confluent opacity on the right. Heart is mildly enlarged.  No acute bony abnormality.  IMPRESSION: Mild cardiomegaly.  Left lower lobe atelectasis or consolidation with small left effusion.  Original Report Authenticated By: Cyndie Chime, M.D.   Ct Abdomen Pelvis W Contrast  01/21/2012  *RADIOLOGY REPORT*  Clinical Data: Abdominal pain.  History of cirrhosis.  CT ABDOMEN AND PELVIS WITH CONTRAST  Technique:  Multidetector CT imaging of the abdomen and pelvis was performed following the standard protocol during bolus administration of intravenous contrast.  Contrast: OMNIPAQUE IOHEXOL 300 MG/ML IV SOLN  Comparison: 05/28/2011  Findings: Small left pleural effusion with compressive atelectasis in the left lower lobe.  No effusion on the right.  Coronary artery calcifications are present.  Heart is normal size.  Small pericardial effusion.  Changes of cirrhosis.  The liver is shrunken and nodular.  Moderate ascites.  Spleen is enlarged.  There is a large left perinephric hematoma, possibly subcapsular. This measures 14 x 10 cm.  This compresses and displaces the left kidney.  Probable slight decreased perfusion of the left kidney relative to the right kidney.  The hematoma also extends into the left retroperitoneum more inferiorly along the anterior aspect of the iliopsoas muscle.  Locules of gas are noted within this component of the retroperitoneal hematoma of unknown etiology.  Large and small bowel grossly unremarkable.  Large  spontaneous splenorenal shunt noted with dilated tortuous vessels extending into the pelvis.  Aorta is normal caliber.  Small bilateral nonobstructing renal stones.  No ureteral stones or hydronephrosis.  No acute bony abnormality.  IMPRESSION: Large left perinephric subcapsular hematoma with compression and displacement of the left kidney.  There is mild or early decrease in perfusion to the left kidney (page kidney).  The hematoma also extends into the left retroperitoneum.  Locules of gas are seen within the retroperitoneal hematoma in the left side of the pelvis of unknown etiology.  Changes of cirrhosis with moderate ascites, splenomegaly and large spontaneous splenorenal shunt.  Small left pleural effusion with compressive atelectasis in the left lower lobe.  Bilateral nephrolithiasis.  Critical Value/emergent results were called by telephone at the time of interpretation on 01/21/2012  at 12:45 a.m.  to  the ER physician at [facility], who verbally acknowledged these results.  Original Report Authenticated By: Cyndie Chime, M.D.    Medications: Scheduled Meds:   . calcitonin (  salmon)  1 spray Alternating Nares q morning - 10a  . cefTRIAXone (ROCEPHIN)  IV  1 g Intravenous Q24H  . docusate sodium  100 mg Oral BID  . influenza  inactive virus vaccine  0.5 mL Intramuscular Once  . insulin aspart  0-20 Units Subcutaneous Q4H  . lactulose  30 g Oral TID  . levothyroxine  50 mcg Oral Q0600  . metoprolol succinate  12.5 mg Oral Daily  . mulitivitamin with minerals  1 tablet Oral Daily  . nystatin  1 g Topical Daily  . pantoprazole  40 mg Oral Q1200  . potassium chloride SA  20 mEq Oral BID  . rifaximin  550 mg Oral BID  . rOPINIRole  1 mg Oral QHS  . sodium chloride  3 mL Intravenous Q12H  . sodium chloride  3 mL Intravenous Q12H   Continuous Infusions:  PRN Meds:.sodium chloride, HYDROmorphone (DILAUDID) injection, magic mouthwash, sodium chloride  Assessment/Plan:  Principal  Problem:  *Retroperitoneal bleed Active Problems:  AODM  OBSTRUCTIVE SLEEP APNEA  Essential hypertension, benign  Cirrhosis  Morbid obesity  Anemia  Coagulopathy  UTI (lower urinary tract infection)  1. *Retroperitoneal bleed: with large left perinephric hematoma and splenorenal shunt. Likely secondary to liver disease.S/P 2 units FFP and status post Vit K. INR stable 2.14.  Will monitor cbc. Spoke with GI Dr. Elnoria Howard. States splenorenal shunt not unexpected in situation. Made aware of admission. No indications of further bleeding.If further bleeding may need embolization. SBP range 97-128. HR 103-110  Active Problems:  2. Hyperkalemia. Will discontinue home potassium supplement. Monitor closely. 2. AODM: HgA1c 5.5 CBG's q 4hrs. SSI.  Clear liquid diet CBG 186-200. Will resume home basal at lower dose. Monitor  3. OBSTRUCTIVE SLEEP APNEA: CPAP Pt refusing nocturnal PPV 4. Essential hypertension, benign: controlled. SBP range 97-128 Continue BB. Monitor closely  5. Cirrhosis: See #1.  6. Morbid obesity: Nutritional consult  7.Anemia: acute bld loss as well as chronic disease: Hg stable 12.9. Will monitor q8hr or more often if pt. Condition inidcate.  8.Coagulopathy: secondary known liver disease. S/P 2 units FFP and Vit K. Stable. Will monitor closely. See #1.  9.UTI (lower urinary tract infection): Urine culture pending  Rocephin day #3.  10. CKD III. Creatinine 1.7. Will monitor. Consider gently IV hydration. 11. Hyponatremia: Chart review indicates chronic component. Will monitor closely.       LOS: 2 days   [facility] M 01/22/2012, 10:11 AM

## 2012-01-22 NOTE — Progress Notes (Signed)
Spoke with Dr. Annabell Howells from Urology. He has reviewed [name] case and feels that at this time hte best course of action is to monitor pt's Hb. This is likely related to his liver disease and his spleno-renal shunt. I have discussed all of the above with NP Toya Smothers. Pt examined and assessed and sdiscussed with Toya Smothers. Agree with assessment and plan.

## 2012-01-22 NOTE — Progress Notes (Addendum)
PICC line placed by IV team. Radiology to verify placement.  Paged MD to verify if ok to use PICC based on radiology report.

## 2012-01-23 ENCOUNTER — Inpatient Hospital Stay (HOSPITAL_COMMUNITY): Payer: 59

## 2012-01-23 DIAGNOSIS — R58 Hemorrhage, not elsewhere classified: Secondary | ICD-10-CM

## 2012-01-23 DIAGNOSIS — K7460 Unspecified cirrhosis of liver: Secondary | ICD-10-CM

## 2012-01-23 DIAGNOSIS — K659 Peritonitis, unspecified: Secondary | ICD-10-CM

## 2012-01-23 LAB — BASIC METABOLIC PANEL
GFR calc Af Amer: 51 mL/min — ABNORMAL LOW (ref >90)
GFR calc non Af Amer: 44 mL/min — ABNORMAL LOW (ref >90)
Glucose, Bld: 209 mg/dL — ABNORMAL HIGH (ref 70–99)
Potassium: 5 mEq/L (ref 3.5–5.1)
Sodium: 128 mEq/L — ABNORMAL LOW (ref 135–145)

## 2012-01-23 LAB — PREPARE FRESH FROZEN PLASMA: Unit division: 0

## 2012-01-23 LAB — GLUCOSE, CAPILLARY
Glucose-Capillary: 228 mg/dL — ABNORMAL HIGH (ref 70–99)
Glucose-Capillary: 182 mg/dL — ABNORMAL HIGH (ref 70–99)

## 2012-01-23 LAB — PROTIME-INR
INR: 1.96 — ABNORMAL HIGH (ref 0.00–1.49)
Prothrombin Time: 22.7 seconds — ABNORMAL HIGH (ref 11.6–15.2)

## 2012-01-23 LAB — CBC
Hemoglobin: 10.6 g/dL — ABNORMAL LOW (ref 13.0–17.0)
Platelets: 191 10*3/uL (ref 150–400)
RBC: 3.49 MIL/uL — ABNORMAL LOW (ref 4.22–5.81)
WBC: 16.4 10*3/uL — ABNORMAL HIGH (ref 4.0–10.5)

## 2012-01-23 LAB — HEMOGLOBIN AND HEMATOCRIT, BLOOD
HCT: 31.6 % — ABNORMAL LOW (ref 39.0–52.0)
Hemoglobin: 10.8 g/dL — ABNORMAL LOW (ref 13.0–17.0)

## 2012-01-23 LAB — AMMONIA: Ammonia: 82 umol/L — ABNORMAL HIGH (ref 11–60)

## 2012-01-23 MED ORDER — INSULIN GLARGINE 100 UNIT/ML ~~LOC~~ SOLN
5.0000 [IU] | Freq: Every day | SUBCUTANEOUS | Status: DC
  Administered 2012-01-23: 5 [IU] via SUBCUTANEOUS
  Filled 2012-01-23: qty 3

## 2012-01-23 MED ORDER — IOHEXOL 300 MG/ML  SOLN
80.0000 mL | Freq: Once | INTRAMUSCULAR | Status: AC | PRN
  Administered 2012-01-23: 80 mL via INTRAVENOUS

## 2012-01-23 NOTE — Progress Notes (Signed)
UR complete 

## 2012-01-23 NOTE — Progress Notes (Signed)
Inpatient Diabetes Program Recommendations  AACE/ADA: New Consensus Statement on Inpatient Glycemic Control (2009)  Target Ranges:  Prepandial:   less than 140 mg/dL      Peak postprandial:   less than 180 mg/dL (1-2 hours)      Critically ill patients:  140 - 180 mg/dL   Reason for Visit: Hyperglycemia  Results for [name], [name] (MRN [id number]) as of 01/23/2012 16:05  Ref. Range 01/22/2012 11:44 01/22/2012 16:50 01/22/2012 21:55 01/23/2012 07:33 01/23/2012 11:26  Glucose-Capillary Latest Range: 70-99 mg/dL 409 (H) 811 (H) 914 (H) 211 (H) 228 (H)    Inpatient Diabetes Program Recommendations Insulin - Basal: Lantus 22 units QHS (give 1/2 home dose) Insulin - Meal Coverage: Add Novolog 3 units tidwc  Note: Will follow.

## 2012-01-23 NOTE — Progress Notes (Signed)
Discussed with NP Cordelia Pen and with GI- Dr. Leone Payor. Will re-image abdomen. GI states no role for octreotide. Awaiting urine cultures will treat for 10 days as a complicated pyelonephritis in light of perinephric bleed and possibility if  Infected blood.

## 2012-01-23 NOTE — Consult Note (Signed)
Referring Provider: No ref. provider found Primary Care Physician:  [name],Collin L., DO, DO Primary Gastroenterologist:  Dr.Dan Christella Hartigan  Reason for Consultation: Cirrhosis, retroperitoneal and left perinephric bleed, and persistent severe abdominal pain HPI: [name] is a 61 y.o. male known to Dr. Wendall Papa with multiple serious medical problems including coronary artery disease for which he is status post stent placement with bare-metal stents after an MI in October of 2012. He then had a left hip replacement and had a long hospital course after that with recurrent encephalopathy. He has [name] with cirrhosis which has been complicated by anasarca, ascites, encephalopathy, and coagulopathy. He has most recently been living at a rehabilitation unit. He also has history of morbid obesity chronic renal insufficiency adult-onset diabetes mellitus and sleep apnea.  Patient was admitted to the hospital on 01/20/2012 with complaints of acute abdominal pain. He says he had not fallen as he has not been walking by himself at the rehabilitation. He was unaware of any injury. Workup in the emergency room a CT scan of the abdomen and pelvis shows a large left perinephric possibly subcapsular hematoma measuring 14 x 10 cm and extending into the left retroperitoneum. This appeared to be compressing in displacing the left kidney. Gasloculeswere noted within the hematoma on the CT as well. He also had moderate  Ascites Over the past couple of days he has continued to complain of significant abdominal pain but he does not feel is improving at all. He says his whole abdomen hurts.  Review of labs show admission CBC with WBC of 22.1 hemoglobin 13.3 hematocrit 58, today WBC 16.4 hemoglobin 10.6 hematocrit 30.5 platelets 191. ProTime is 22.7 INR 1.96. He is currently on Rocephin due to an Escherichia coli UTI.   Past Medical History  Diagnosis Date  . Hypertension   . Type II or unspecified type diabetes mellitus  without mention of complication, not stated as uncontrolled   . Thyroid disease   . Cirrhosis, non-alcoholic   . Morbid obesity   . Pancytopenia   . Fatty liver   . Coronary artery disease   . Myocardial infarction     "HEART INCIDENT " OCT 2012  . Angina     RARE USE OF NTG  . CHF (congestive heart failure)     JUNE 2012  . Arthritis   . Gout 35 YRS AGO  . Cellulitis of left leg   . Cirrhosis of liver     NONALCOHOLIC  . Nocturia   . Elevated BUN   . Elevated serum creatinine   . Serum ammonia increased   . Splenomegaly   . Platelets decreased   . Thyroid disorder     PT DOES NOT KNOW IF LOW OR HIGH  . Chronic kidney disease (CKD), stage III (moderate)   . Obstructive sleep apnea   . Hyponatremia   . Hyperkalemia   . Heart murmur   . Back pain     has compression fx    Past Surgical History  Procedure Date  . Coronary stent placement     2 CARDIAC STENTS OCT 08/2011  . Total hip arthroplasty 10/14/2011    Procedure: TOTAL HIP ARTHROPLASTY ANTERIOR APPROACH;  Surgeon: Kathryne Hitch;  Location: [facility];  Service: Orthopedics;  Laterality: Right;  . Joint replacement   . Appendectomy 1968  . Tonsillectomy 1957  . Cardiac catheterization 09/08/2011    Prior to Admission medications   Medication Sig Start Date End Date Taking? Authorizing Provider  Alum & Mag Hydroxide-Simeth (MAGIC MOUTHWASH) SOLN Swish and spit 5 mLs 4 (four) times daily as needed.   Yes Historical Provider, MD  aspirin 81 MG chewable tablet Chew 81 mg by mouth daily.   Yes Historical Provider, MD  calcitonin, salmon, (MIACALCIN/FORTICAL) 200 UNIT/ACT nasal spray Place 1 spray into the nose See admin instructions. 200 units spone spray Alternate nostrils every day   Yes Historical Provider, MD  docusate sodium (COLACE) 100 MG capsule Take 100 mg by mouth 2 (two) times daily.     Yes Historical Provider, MD  furosemide (LASIX) 80 MG tablet Take 120 mg by mouth 2 (two) times daily. 01/11/12   Yes Rob Bunting, MD  insulin aspart (NOVOLOG) 100 UNIT/ML injection Inject 6 Units into the skin 3 (three) times daily with meals. As directed per sliding scale 10/31/11 10/30/12 Yes Novlet Wandra Mannan, MD  insulin detemir (LEVEMIR) 100 UNIT/ML injection Inject 44 Units into the skin at bedtime. 12/12/11 12/11/12 Yes Michelle A. Ashley Royalty, MD  lactulose (CHRONULAC) 10 GM/15ML solution Take 45 mLs (30 g total) by mouth 3 (three) times daily. 01/11/12  Yes Rob Bunting, MD  levothyroxine (SYNTHROID, LEVOTHROID) 50 MCG tablet Take 50 mcg by mouth every morning.    Yes Historical Provider, MD  lidocaine (LIDODERM) 5 % Place 1 patch onto the skin daily. Remove & Discard patch within 12 hours or as directed by MD   Yes Historical Provider, MD  metoprolol succinate (TOPROL-XL) 25 MG 24 hr tablet 12.5 mg daily.    Yes Historical Provider, MD  Multiple Vitamin (MULITIVITAMIN WITH MINERALS) TABS Take 1 tablet by mouth daily.   Yes Historical Provider, MD  nitroGLYCERIN (NITROSTAT) 0.4 MG SL tablet Place 0.4 mg under the tongue every 5 (five) minutes as needed. CHEST PAIN   Yes Historical Provider, MD  nystatin (NYSTOP) 100000 UNIT/GM POWD Apply 1 g topically daily. topically 10/31/11  Yes Novlet Wandra Mannan, MD  pantoprazole (PROTONIX) 40 MG tablet Take 40 mg by mouth daily at 12 noon.   10/31/11 10/30/12 Yes Novlet Wandra Mannan, MD  potassium chloride SA (K-DUR,KLOR-CON) 20 MEQ tablet Take 1 tablet (20 mEq total) by mouth 2 (two) times daily. 01/06/12 01/05/13 Yes Pleas Koch, MD  rifaximin (XIFAXAN) 550 MG TABS Take 550 mg by mouth 2 (two) times daily. 11/23/11  Yes Rob Bunting, MD  rOPINIRole (REQUIP) 1 MG tablet Take 1 mg by mouth at bedtime.   Yes Historical Provider, MD    Current Facility-Administered Medications  Medication Dose Route Frequency Provider Last Rate Last Dose  . 0.9 %  sodium chloride infusion  250 mL Intravenous PRN Houston Siren, MD      . calcitonin (salmon) (MIACALCIN/FORTICAL) nasal spray  1 spray  1 spray Alternating Nares q morning - 10a Houston Siren, MD   1 spray at 01/23/12 1037  . cefTRIAXone (ROCEPHIN) 1 g in dextrose 5 % 50 mL IVPB  1 g Intravenous Q24H Houston Siren, MD   1 g at 01/23/12 0514  . docusate sodium (COLACE) capsule 100 mg  100 mg Oral BID Houston Siren, MD   100 mg at 01/23/12 1022  . HYDROmorphone (DILAUDID) injection 0.5 mg  0.5 mg Intravenous Q4H PRN Houston Siren, MD      . influenza  inactive virus vaccine (FLUZONE/FLUARIX) injection 0.5 mL  0.5 mL Intramuscular Once Michelle A. Ashley Royalty, MD      . insulin aspart (novoLOG) injection 0-20 Units  0-20 Units Subcutaneous TID WC Rolan Lipa, NP  7 Units at 01/23/12 1158  . insulin aspart (novoLOG) injection 0-5 Units  0-5 Units Subcutaneous QHS Rolan Lipa, NP      . lactulose [facility]) 10 GM/15ML solution 30 g  30 g Oral TID Houston Siren, MD   30 g at 01/23/12 1039  . levothyroxine (SYNTHROID, LEVOTHROID) tablet 50 mcg  50 mcg Oral Q0600 Houston Siren, MD   50 mcg at 01/23/12 0514  . magic mouthwash  5 mL Oral QID PRN Houston Siren, MD   5 mL at 01/21/12 2234  . metoprolol succinate (TOPROL-XL) 24 hr tablet 12.5 mg  12.5 mg Oral Daily Houston Siren, MD   12.5 mg at 01/23/12 1047  . mulitivitamin with minerals tablet 1 tablet  1 tablet Oral Daily Houston Siren, MD   1 tablet at 01/23/12 1026  . nystatin (NYSTOP) topical powder 100,000 Units  1 g Topical Daily Houston Siren, MD   100,000 Units at 01/22/12 1042  . pantoprazole (PROTONIX) EC tablet 40 mg  40 mg Oral Q1200 Houston Siren, MD   40 mg at 01/23/12 1156  . rifaximin (XIFAXAN) tablet 550 mg  550 mg Oral BID Houston Siren, MD   550 mg at 01/23/12 1026  . rOPINIRole (REQUIP) tablet 1 mg  1 mg Oral QHS Houston Siren, MD   1 mg at 01/22/12 2150  . sodium chloride 0.9 % injection 10-40 mL  10-40 mL Intracatheter Q12H Michelle A. Ashley Royalty, MD      . sodium chloride 0.9 % injection 10-40 mL  10-40 mL Intracatheter PRN Michelle A. Ashley Royalty, MD      . sodium chloride 0.9 % injection 3 mL  3 mL  Intravenous Q12H Houston Siren, MD   3 mL at 01/22/12 1043  . sodium chloride 0.9 % injection 3 mL  3 mL Intravenous Q12H Houston Siren, MD   3 mL at 01/21/12 2200  . sodium chloride 0.9 % injection 3 mL  3 mL Intravenous PRN Houston Siren, MD      . DISCONTD: insulin aspart (novoLOG) injection 0-20 Units  0-20 Units Subcutaneous Q4H Houston Siren, MD   7 Units at 01/22/12 1651    Allergies as of 01/20/2012 - Review Complete 01/20/2012  Allergen Reaction Noted  . Crestor (rosuvastatin calcium)  10/24/2011  . Statins Other (See Comments) 07/12/2011    Family History  Problem Relation Age of Onset  . Colon cancer Neg Hx   . Diabetes Mother   . Diabetes Father     History   Social History  . Marital Status: Married    Spouse Name: N/A    Number of Children: 1  . Years of Education: 14   Occupational History  . Optician, dispensing (retired)   . Masters degree in [place]    Social History Main Topics  . Smoking status: Never Smoker   . Smokeless tobacco: Never Used  . Alcohol Use: No  . Drug Use: No  . Sexually Active: No   Other Topics Concern  . Not on file   Social History Narrative   Married, lives with wife in [place].  Ambulates with walker.    Review of Systems: Pertinent positive and negative review of systems were noted in the above HPI section.  All other review of systems was otherwise negative.  Physical Exam: Vital signs in last 24 hours: Temp:  [97.8 F (36.6 C)-98.7 F (37.1 C)] 98.7 F (37.1 C) (02/25 0553) Pulse Rate:  [100-111] 105  (02/25 1047) Resp:  [16-18] 18  (  02/25 0553) BP: (113-129)/(75-84) 113/78 mmHg (02/25 1047) SpO2:  [94 %-97 %] 96 % (02/25 0553) Weight:  [286 lb 9.6 oz (130 kg)] 286 lb 9.6 oz (130 kg) (02/25 0553) Last BM Date: 01/22/12 General:   Alert,  Well-developed, obese, chronically ill appearing, pleasant and cooperative in NAD,tearful Head:  Normocephalic and atraumatic. Eyes:  Sclera clear, no icterus.   Conjunctiva pink. Ears:  Normal auditory  acuity. Nose:  No deformity, discharge,  or lesions. Mouth:  No deformity or lesions.   Neck:  Supple; no masses or thyromegaly. Lungs:  Clear throughout to auscultation. Decreased breath sounds in the bases   . Heart:  Regular rate and rhythm; no murmurs, clicks, rubs,  or gallops. Abdomen:  Soft, morbidly obese, diffusely tender, positive rebound, he has pitting edema bilaterally in his flanks, and bruising of the upper abdomen, I do not see any flank ecchymoses however he is unable to roll on his side. Bowel sounds are present  Rectal:  Deferred  Msk:  Symmetrical without gross deformities. . Pulses:  Normal pulses noted. Extremities:  Without clubbing, 2+ edema in bilateral lower remedies above the knee Neurologic:  Alert and  oriented x4;  grossly normal neurologically, labile Skin:  Intact without significant lesions or rashes.. Psych:  Alert and cooperative. Normal mood and affect, tearful  Intake/Output from previous day: 02/24 0701 - 02/25 0700 In: 800 [P.O.:800] Out: 1201 [Urine:1200; Stool:1] Intake/Output this shift: Total I/O In: 600 [P.O.:600] Out: 300 [Urine:300]  Lab Results:  Basename 01/23/12 1025 01/23/12 0212 01/22/12 1847 01/21/12 1545 01/20/12 2121  WBC -- 16.4* -- 15.3* 22.1*  HGB 10.8* 10.6* 11.7* -- --  HCT 31.6* 30.9* 33.5* -- --  PLT -- 191 -- 161 257   BMET  Basename 01/23/12 0212 01/22/12 0815 01/20/12 2121  NA 128* 129* 128*  K 5.0 5.6* 4.8  CL 94* 91* 90*  CO2 27 27 27   GLUCOSE 209* 211* 145*  BUN 63* 66* 54*  CREATININE 1.62* 1.74* 1.56*  CALCIUM 8.6 8.8 8.8   LFT  Basename 01/20/12 2121  PROT 6.0  ALBUMIN 1.3*  AST 26  ALT 17  ALKPHOS 228*  BILITOT 2.0*  BILIDIR --  IBILI --   PT/INR  Basename 01/23/12 0212 01/22/12 0300  LABPROT 22.7* 24.3*  INR 1.96* 2.14*        Studies/Results: Dg Chest Port 1 View  01/22/2012  *RADIOLOGY REPORT*  Clinical Data: Verified PICC line placement.  PORTABLE CHEST - 1 VIEW  Comparison:  01/20/2012  Findings: The right arm PICC line tip is poorly characterized due to body habitus and overlying spine.  The PICC line tip appears to be in the region of the right atrium.  Again noted is opacification and at the left lung base probably related to consolidation and/or pleural fluid.  Upper lungs are clear.  The trachea is midline.  IMPRESSION: Limited evaluation of the PICC line tip.  The tip is probably in the region of the right atrium.  Stable opacification at the left lung base suggestive for consolidation and pleural fluid.  Original Report Authenticated By: Richarda Overlie, M.D.    IMPRESSION:  #78 61 year old male with NASH/Cirrhosis complicated by anasarca, ascites, coagulopathy, and chronic encephalopathy.  #2 acute left perinephric and left retroperitoneal bleed with large hematoma containing gas locules. Rule out infected hematoma  #3 persistent severe abdominal pain, again rule out infected hematoma, SBP, or an enlarging hematoma . #4 coronary artery disease status post MI with stent October  2012  #5 Escherichia coli UTI  #6 Diabetes mellitus  #7 morbid obesity and deconditioning  #8 status post left hip replacement November 2012  PLAN: #1 Will obtain followup CT scan of the abdomen and pelvis with contrast to reassess hematoma. #2 patient may need IR drainage pending on results of CT #3 followup labs in a.m. Thank you for the consult we'll follow with you   [name]  01/23/2012, 2:25 PM   Attending:  Main problem is spontaneous perinephric and retroperitonaeal bleed, in setting of decompensated cirrhosis I have also seen and examined the patient. We will reassess with repeat CT scan. ? Infected. Not many easy options for this man.  Iva Boop, MD, [facility] [phone] (pager) 01/23/2012 3:47 PM

## 2012-01-23 NOTE — Progress Notes (Signed)
PATIENT DETAILS Name: [name] Age: 61 y.o. Sex: male Date of Birth: 12-May-1951 Admit Date: 01/20/2012 AVW:UJWJ,XBJYNWG L., DO, DO POA:   CONSULTS: [facility]   Subjective: C/o abdominal pain. Was just cleaned and repositioned by nursing staff. Denies any chest pain or sob. States pain medication is effective.   Objective: Vital signs in last 24 hours: Temp:  [97.8 F (36.6 C)-98.7 F (37.1 C)] 98.7 F (37.1 C) (02/25 0553) Pulse Rate:  [100-111] 105  (02/25 1047) Resp:  [16-18] 18  (02/25 0553) BP: (113-129)/(75-84) 113/78 mmHg (02/25 1047) SpO2:  [94 %-97 %] 96 % (02/25 0553) Weight:  [130 kg (286 lb 9.6 oz)] 130 kg (286 lb 9.6 oz) (02/25 0553) Weight change: 3.4 kg (7 lb 7.9 oz) Last BM Date: 01/22/12  Intake/Output from previous day:  Intake/Output Summary (Last 24 hours) at 01/23/12 1424 Last data filed at 01/23/12 1259  Gross per 24 hour  Intake   1080 ml  Output   1100 ml  Net    -20 ml     Physical Exam:  General: Awake, alert in NAD Heart:RRR, without murmurs, rubs, gallops.  Lungs: Normal effort but somewhat shallow. Breath sounds clear to auscultation bilaterally.  Abdomen:Obese Soft, nondistended, positive bowel sounds. No rebound or guarding. Extensive bruising over abdomen with swelling to left side of abdomen. Extremities: No clubbing cyanosis with positive pedal pulses. +Anasarca Neuro: Grossly intact, nonfocal. Speech clear but slow.     Lab Results:  Lab 01/23/12 1025 01/23/12 0212 01/22/12 1847 01/21/12 1545 01/20/12 2121  HGB 10.8* 10.6* 11.7* -- --  HCT 31.6* 30.9* 33.5* -- --  WBC -- 16.4* -- 15.3* 22.1*  PLT -- 191 -- 161 257     Lab 01/23/12 0212 01/22/12 0815 01/21/12 0918 01/20/12 2121  NA 128* 129* -- 128*  K 5.0 5.6* -- --  CL 94* 91* -- 90*  CO2 27 27 -- 27  GLUCOSE 209* 211* -- 145*  BUN 63* 66* -- 54*  CREATININE 1.62* 1.74* -- 1.56*  CALCIUM 8.6 8.8 -- 8.8  MG -- -- 2.1 --  PHOS -- -- -- --     Studies/Results: Dg Chest Port 1 View  01/22/2012  *RADIOLOGY REPORT*  Clinical Data: Verified PICC line placement.  PORTABLE CHEST - 1 VIEW  Comparison: 01/20/2012  Findings: The right arm PICC line tip is poorly characterized due to body habitus and overlying spine.  The PICC line tip appears to be in the region of the right atrium.  Again noted is opacification and at the left lung base probably related to consolidation and/or pleural fluid.  Upper lungs are clear.  The trachea is midline.  IMPRESSION: Limited evaluation of the PICC line tip.  The tip is probably in the region of the right atrium.  Stable opacification at the left lung base suggestive for consolidation and pleural fluid.  Original Report Authenticated By: Richarda Overlie, M.D.    Medications: Scheduled Meds:   . calcitonin (salmon)  1 spray Alternating Nares q morning - 10a  . cefTRIAXone (ROCEPHIN)  IV  1 g Intravenous Q24H  . docusate sodium  100 mg Oral BID  . influenza  inactive virus vaccine  0.5 mL Intramuscular Once  . insulin aspart  0-20 Units Subcutaneous TID WC  . insulin aspart  0-5 Units Subcutaneous QHS  . lactulose  30 g Oral TID  . levothyroxine  50 mcg Oral Q0600  . metoprolol succinate  12.5 mg Oral Daily  .  mulitivitamin with minerals  1 tablet Oral Daily  . nystatin  1 g Topical Daily  . pantoprazole  40 mg Oral Q1200  . rifaximin  550 mg Oral BID  . rOPINIRole  1 mg Oral QHS  . sodium chloride  10-40 mL Intracatheter Q12H  . sodium chloride  3 mL Intravenous Q12H  . sodium chloride  3 mL Intravenous Q12H  . DISCONTD: insulin aspart  0-20 Units Subcutaneous Q4H   Continuous Infusions:  PRN Meds:.sodium chloride, HYDROmorphone (DILAUDID) injection, magic mouthwash, sodium chloride, sodium chloride Antibiotics: Anti-infectives     Start     Dose/Rate Route Frequency Ordered Stop   01/21/12 0600   cefTRIAXone (ROCEPHIN) 1 g in dextrose 5 % 50 mL IVPB        1 g 100 mL/hr over 30 Minutes  Intravenous Every 24 hours 01/21/12 0214     01/21/12 0230   rifaximin (XIFAXAN) tablet 550 mg        550 mg Oral 2 times daily 01/21/12 0221     01/20/12 2345   azithromycin (ZITHROMAX) 500 mg in dextrose 5 % 250 mL IVPB  Status:  Discontinued        500 mg 250 mL/hr over 60 Minutes Intravenous Every 24 hours 01/20/12 2332 01/20/12 2341   01/20/12 2345   vancomycin (VANCOCIN) IVPB 1000 mg/200 mL premix        1,000 mg 200 mL/hr over 60 Minutes Intravenous  Once 01/20/12 2342 01/21/12 0141   01/20/12 2345   cefTAZidime (FORTAZ) 2 g in dextrose 5 % 50 mL IVPB        2 g 100 mL/hr over 30 Minutes Intravenous  Once 01/20/12 2342 01/21/12 0035   01/20/12 2300   cefTRIAXone (ROCEPHIN) 1 g in dextrose 5 % 50 mL IVPB  Status:  Discontinued        1 g 100 mL/hr over 30 Minutes Intravenous Every 24 hours 01/20/12 2252 01/20/12 2341           Assessment/Plan: 1. Retroperitoneal bleed (with large left perinephric hematoma and splenorenal shunt). Likely related to chronic liver disease and subsequent coagulopathy. Urology has reccommended conservative treatment. He does have a slight drop in Hgb today 10.8<--11.7, but this remains stable. VSS. Consider repeat imaging if pain persists +/- hgb drops  2. Coagulopathy in setting of chronic liver disease: Pt has received 2 units of FFP as well as Vit K. INR 1.96<--2.14 without evidence of worsening bleed. Continue to monitor.   3. E. Coli UTI: D3 Rocephin. Can likely d/c after today's dose. Await sensitivities  4. NASH with cirrhosis:  GI to consult. Continue Lactulose and Rifaximin. Would continue holding diuretics with current issues but will need to resume at some point.  5. Hyperkalemia: resolved. Home K+ supplement held  6. Anemia: acute blood loss with above a/w chronic disease. Monitor.   7. Leukocytosis: WBC 16.4<--15.3<--22.1. Likely reactive with above a/w UTI. There was gas seen on initial abdominal CT. Will discuss repeating  CT with MD.   8. DM2: CBGs 200s. Will start pt's LA insulin but at very low dose and titrate as needed. Cont SSI.  9. CKD, stage III: at baseline  10. Morbid Obesity   11. CAD s/p stents 08/2011: no cardiac complaints  12. Prophylaxis: no Lovenox with #2  13. Dispo: when medically stable.  Cordelia Pen, NP-C [facility]  pgr [phone]     LOS: 3 days    01/23/2012, 2:24 PM

## 2012-01-24 DIAGNOSIS — R609 Edema, unspecified: Secondary | ICD-10-CM

## 2012-01-24 DIAGNOSIS — R601 Generalized edema: Secondary | ICD-10-CM | POA: Diagnosis present

## 2012-01-24 LAB — HEMOGLOBIN AND HEMATOCRIT, BLOOD
HCT: 31.7 % — ABNORMAL LOW (ref 39.0–52.0)
Hemoglobin: 10.8 g/dL — ABNORMAL LOW (ref 13.0–17.0)

## 2012-01-24 LAB — URINE CULTURE
Colony Count: >=100000
Culture  Setup Time: 201302230221

## 2012-01-24 LAB — CBC
MCH: 30.2 pg (ref 26.0–34.0)
Platelets: 163 10*3/uL (ref 150–400)
RBC: 3.67 MIL/uL — ABNORMAL LOW (ref 4.22–5.81)
RDW: 16.6 % — ABNORMAL HIGH (ref 11.5–15.5)
WBC: 21.8 10*3/uL — ABNORMAL HIGH (ref 4.0–10.5)

## 2012-01-24 LAB — GLUCOSE, CAPILLARY
Glucose-Capillary: 189 mg/dL — ABNORMAL HIGH (ref 70–99)
Glucose-Capillary: 219 mg/dL — ABNORMAL HIGH (ref 70–99)

## 2012-01-24 LAB — BASIC METABOLIC PANEL
CO2: 25 mEq/L (ref 19–32)
Calcium: 8.7 mg/dL (ref 8.4–10.5)
Creatinine, Ser: 1.38 mg/dL — ABNORMAL HIGH (ref 0.50–1.35)
GFR calc Af Amer: 62 mL/min — ABNORMAL LOW (ref >90)
Sodium: 127 mEq/L — ABNORMAL LOW (ref 135–145)

## 2012-01-24 LAB — PROTIME-INR: Prothrombin Time: 23.5 seconds — ABNORMAL HIGH (ref 11.6–15.2)

## 2012-01-24 MED ORDER — FUROSEMIDE 10 MG/ML IJ SOLN
40.0000 mg | Freq: Two times a day (BID) | INTRAMUSCULAR | Status: DC
Start: 2012-01-25 — End: 2012-01-25
  Administered 2012-01-25: 40 mg via INTRAVENOUS
  Filled 2012-01-24 (×2): qty 4

## 2012-01-24 MED ORDER — INSULIN GLARGINE 100 UNIT/ML ~~LOC~~ SOLN
10.0000 [IU] | Freq: Every day | SUBCUTANEOUS | Status: DC
Start: 2012-01-24 — End: 2012-01-26
  Administered 2012-01-24 – 2012-01-25 (×2): 10 [IU] via SUBCUTANEOUS
  Filled 2012-01-24: qty 3

## 2012-01-24 MED ORDER — FUROSEMIDE 40 MG PO TABS
40.0000 mg | ORAL_TABLET | Freq: Two times a day (BID) | ORAL | Status: DC
  Administered 2012-01-24: 40 mg via ORAL
  Filled 2012-01-24 (×3): qty 1

## 2012-01-24 NOTE — Progress Notes (Signed)
Patient ID: [name], male   DOB: [birth year], 61 y.o.   MRN: [id number]  Gastroenterology Progress Note  Subjective: Mentating well , his  abdominal pain is the same. Repeat Ct yesterday shows the hematome to be stable 10 x 14 cm. +also shows the retroperitoneal fluid collection containing gas and consistent with abscess or fistula, large amt of ascites, and soft tissue edema.  Wbc now up  to  21.9 Objective:  Vital signs in last 24 hours: Temp:  [98.2 F (36.8 C)-98.7 F (37.1 C)] 98.2 F (36.8 C) (02/26 0551) Pulse Rate:  [98-108] 101  (02/26 0551) Resp:  [16-20] 20  (02/26 0551) BP: (108-117)/(70-79) 108/71 mmHg (02/26 0551) SpO2:  [92 %-95 %] 92 % (02/26 0551) Weight:  [293 lb 3.4 oz (133 kg)] 293 lb 3.4 oz (133 kg) (02/26 0551) Last BM Date: 01/23/12 General:   Alert,  Well-developed,    in NAD Heart:  Regular rate and rhythm; no murmurs Pulm;decreased B S bilaterally Abdomen:  Soft, tender diffusely,2-3 + edema in both flanks, Extremities:  2-3 +  Edema.in to the thighs Neurologic:  Alert and  oriented x4;  grossly normal neurologically. Psych:  Alert and cooperative. Normal mood and affect.  More tender in LLQ area Iva Boop, MD, [facility]  Intake/Output from previous day: 02/25 0701 - 02/26 0700 In: 1080 [P.O.:1080] Out: 1251 [Urine:1250; Stool:1] Intake/Output this shift: Total I/O In: 120 [P.O.:120] Out: -   Lab Results:  Basename 01/24/12 1015 01/24/12 0430 01/23/12 2140 01/23/12 0212 01/21/12 1545  WBC -- 21.8* -- 16.4* 15.3*  HGB 10.8* 11.1* 10.5* -- --  HCT 32.1* 32.9* 30.8* -- --  PLT -- 163 -- 191 161   BMET  Basename 01/24/12 0430 01/23/12 0212 01/22/12 0815  NA 127* 128* 129*  K 5.2* 5.0 5.6*  CL 95* 94* 91*  CO2 25 27 27   GLUCOSE 152* 209* 211*  BUN 62* 63* 66*  CREATININE 1.38* 1.62* 1.74*  CALCIUM 8.7 8.6 8.8   LFT No results found for this basename: PROT,ALBUMIN,AST,ALT,ALKPHOS,BILITOT,BILIDIR,IBILI in the last 72  hours PT/INR  Basename 01/24/12 0430 01/23/12 0212  LABPROT 23.5* 22.7*  INR 2.05* 1.96*      Assessment / Plan: #1  61 yo male  With stable large perinephric  Hematoma. #2 retroperitoneal fluid collection  With gas consistent with abscess vs. Fistula. I am concerned as his pain is severe, and his WBC is rising. Will ask IR to see, and decide if amenable to aspiration/drainage  probably need to broaden ABX as well. #3 Anasarca-will start diuretics #4 cirrhosis-decompensated #5 CRI #6 CAD     LOS: 4 days   [name]  01/24/2012, 11:23 AM    Attending:  I have seen and examined the patient also. Very complicated situation. With ? Of infection in LLQ/retroperitoneal fluid collection and hematoma think aspiration appropriate. IR involved and plans to do tomorrow. I have explained this to the patient.

## 2012-01-24 NOTE — Interval H&P Note (Cosign Needed)
History and Physical Interval Note:  01/24/2012 12:57 PM  [name]  Is scheduled for CT guided drainage of left perinephric/left retroperitoneal fluid collections/abscesses on 2/27.  The various methods of treatment have been discussed with the patient and his wife. After consideration of risks, benefits and other options for treatment, the patient has consented to the above procedures .  The patients' history has been reviewed, imaging studies reviewed by Dr. Bonnielee Haff, patient examined, no change in status, stable for the above procedures.  I have reviewed the patients' chart and labs.  Questions were answered to the patient's satisfaction.  Patient's INR will need to be less than or equal to 1.7 in order to proceed with case. He will be given FFP by GI service preprocedure. Past Medical History  Diagnosis Date  . Hypertension   . Type II or unspecified type diabetes mellitus without mention of complication, not stated as uncontrolled   . Thyroid disease   . Cirrhosis, non-alcoholic   . Morbid obesity   . Pancytopenia   . Fatty liver   . Coronary artery disease   . Myocardial infarction     "HEART INCIDENT " OCT 2012  . Angina     RARE USE OF NTG  . CHF (congestive heart failure)     JUNE 2012  . Arthritis   . Gout 35 YRS AGO  . Cellulitis of left leg   . Cirrhosis of liver     NONALCOHOLIC  . Nocturia   . Elevated BUN   . Elevated serum creatinine   . Serum ammonia increased   . Splenomegaly   . Platelets decreased   . Thyroid disorder     PT DOES NOT KNOW IF LOW OR HIGH  . Chronic kidney disease (CKD), stage III (moderate)   . Obstructive sleep apnea   . Hyponatremia   . Hyperkalemia   . Heart murmur   . Back pain     has compression fx   Past Surgical History  Procedure Date  . Coronary stent placement     2 CARDIAC STENTS OCT 08/2011  . Total hip arthroplasty 10/14/2011    Procedure: TOTAL HIP ARTHROPLASTY ANTERIOR APPROACH;  Surgeon: Kathryne Hitch;   Location: [facility];  Service: Orthopedics;  Laterality: Right;  . Joint replacement   . Appendectomy 1968  . Tonsillectomy 1957  . Cardiac catheterization 09/08/2011   Dg Chest 2 View  01/20/2012  *RADIOLOGY REPORT*  Clinical Data: Chest pain.  CHEST - 2 VIEW  Comparison: 01/04/2012  Findings: Left lower lobe airspace opacity noted.  Small left pleural effusion suspected.  No confluent opacity on the right. Heart is mildly enlarged.  No acute bony abnormality.  IMPRESSION: Mild cardiomegaly.  Left lower lobe atelectasis or consolidation with small left effusion.  Original Report Authenticated By: Cyndie Chime, M.D.   Dg Chest 2 View  01/04/2012  *RADIOLOGY REPORT*  Clinical Data: Cirrhosis.  Pain.  CHEST - 2 VIEW  Comparison: 12/14/2011  Findings: Lung volumes are low. The cardiopericardial silhouette is enlarged. There is pulmonary vascular congestion without overt pulmonary edema.  No focal airspace consolidation or overt airspace edema.  No pleural effusion. Imaged bony structures of the thorax are intact.  IMPRESSION: Stable.  No acute findings.  Original Report Authenticated By: ERIC A. MANSELL, M.D.   Ct Head Wo Contrast  01/04/2012  *RADIOLOGY REPORT*  Clinical Data: Altered mental status  CT HEAD WITHOUT CONTRAST  Technique:  Contiguous axial images were obtained from  the base of the skull through the vertex without contrast.  Comparison: 116 01/18/2012  Findings: Atherosclerotic and physiologic intracranial calcifications. There is no evidence of acute intracranial hemorrhage, brain edema, mass lesion, acute infarction,   mass effect, or midline shift. Acute infarct may be inapparent on noncontrast CT.  No other intra-axial abnormalities are seen, and the ventricles and sulci are within normal limits in size and symmetry.   No abnormal extra-axial fluid collections or masses are identified.  No significant calvarial abnormality.  IMPRESSION: 1. Negative for bleed or other acute intracranial  process.  Original Report Authenticated By: Osa Craver, M.D.   Ct Abdomen Pelvis W Contrast  01/23/2012  *RADIOLOGY REPORT*  Clinical Data: Large perinephric and retroperitoneal hematoma. History of cirrhosis, ascites.  Persistent severe pain.  CT ABDOMEN AND PELVIS WITH CONTRAST  Technique:  Multidetector CT imaging of the abdomen and pelvis was performed following the standard protocol during bolus administration of intravenous contrast.  Contrast: 80mL OMNIPAQUE IOHEXOL 300 MG/ML IV SOLN  Comparison: CT of the abdomen pelvis 01/21/2012  Findings: There are bilateral pleural effusions.  Left base consolidation/atelectasis again noted.  There is moderate ascites, slightly increased since previous exam. The contour of the liver is scalloped, consistent with history of cirrhosis.  Spleen is enlarged, consistent with portal venous hypertension.  Large splenorenal shunt again noted.  Again noted is perinephric hematoma on the left, measuring 13.4 by 10.1 cm.  This represents stability since the previous exam.  There is diffuse body wall edema, probably increased since the previous exam.  The right kidney has a normal appearance.  There is stranding surrounding the gallbladder.  There is layering debris within the gallbladder, consistent with layering sludge or stones.  The rugal fold pattern of the stomach appears prominent.  No evidence for bowel obstruction.  A Foley catheter is identified within the urinary bladder.  No evidence for abdominal aortic aneurysm or retroperitoneal adenopathy.  Within the pelvis, retroperitoneal collection of fluid is noted. This contains loculated gas, consistent with fistula, abscess, or prior procedure.  The appearance is similar to previous study.  IMPRESSION:  1.  Stable perinephric/subcapsular hematoma on the left. 2.  Stable retroperitoneal left pelvic collection, containing locules of gas. 3.  Cirrhosis, ascites.  Ascites has increased slightly. 4.  Large  splenorenal shunt.  Original Report Authenticated By: Patterson Hammersmith, M.D.   Ct Abdomen Pelvis W Contrast  01/21/2012  *RADIOLOGY REPORT*  Clinical Data: Abdominal pain.  History of cirrhosis.  CT ABDOMEN AND PELVIS WITH CONTRAST  Technique:  Multidetector CT imaging of the abdomen and pelvis was performed following the standard protocol during bolus administration of intravenous contrast.  Contrast: OMNIPAQUE IOHEXOL 300 MG/ML IV SOLN  Comparison: 05/28/2011  Findings: Small left pleural effusion with compressive atelectasis in the left lower lobe.  No effusion on the right.  Coronary artery calcifications are present.  Heart is normal size.  Small pericardial effusion.  Changes of cirrhosis.  The liver is shrunken and nodular.  Moderate ascites.  Spleen is enlarged.  There is a large left perinephric hematoma, possibly subcapsular. This measures 14 x 10 cm.  This compresses and displaces the left kidney.  Probable slight decreased perfusion of the left kidney relative to the right kidney.  The hematoma also extends into the left retroperitoneum more inferiorly along the anterior aspect of the iliopsoas muscle.  Locules of gas are noted within this component of the retroperitoneal hematoma of unknown etiology.  Large and  small bowel grossly unremarkable.  Large spontaneous splenorenal shunt noted with dilated tortuous vessels extending into the pelvis.  Aorta is normal caliber.  Small bilateral nonobstructing renal stones.  No ureteral stones or hydronephrosis.  No acute bony abnormality.  IMPRESSION: Large left perinephric subcapsular hematoma with compression and displacement of the left kidney.  There is mild or early decrease in perfusion to the left kidney (page kidney).  The hematoma also extends into the left retroperitoneum.  Locules of gas are seen within the retroperitoneal hematoma in the left side of the pelvis of unknown etiology.  Changes of cirrhosis with moderate ascites, splenomegaly  and large spontaneous splenorenal shunt.  Small left pleural effusion with compressive atelectasis in the left lower lobe.  Bilateral nephrolithiasis.  Critical Value/emergent results were called by telephone at the time of interpretation on 01/21/2012  at 12:45 a.m.  to  the ER physician at [facility], who verbally acknowledged these results.  Original Report Authenticated By: Cyndie Chime, M.D.   Dg Chest Port 1 View  01/22/2012  *RADIOLOGY REPORT*  Clinical Data: Verified PICC line placement.  PORTABLE CHEST - 1 VIEW  Comparison: 01/20/2012  Findings: The right arm PICC line tip is poorly characterized due to body habitus and overlying spine.  The PICC line tip appears to be in the region of the right atrium.  Again noted is opacification and at the left lung base probably related to consolidation and/or pleural fluid.  Upper lungs are clear.  The trachea is midline.  IMPRESSION: Limited evaluation of the PICC line tip.  The tip is probably in the region of the right atrium.  Stable opacification at the left lung base suggestive for consolidation and pleural fluid.  Original Report Authenticated By: Richarda Overlie, M.D.  Results for orders placed during the hospital encounter of 01/20/12  CBC      Component Value Range   WBC 22.1 (*) 4.0 - 10.5 (K/uL)   RBC 4.42  4.22 - 5.81 (MIL/uL)   Hemoglobin 13.3  13.0 - 17.0 (g/dL)   HCT 16.1 (*) 09.6 - 52.0 (%)   MCV 87.8  78.0 - 100.0 (fL)   MCH 30.1  26.0 - 34.0 (pg)   MCHC 34.3  30.0 - 36.0 (g/dL)   RDW 04.5 (*) 40.9 - 15.5 (%)   Platelets 257  150 - 400 (K/uL)  DIFFERENTIAL      Component Value Range   Neutrophils Relative 90 (*) 43 - 77 (%)   Neutro Abs 19.8 (*) 1.7 - 7.7 (K/uL)   Lymphocytes Relative 6 (*) 12 - 46 (%)   Lymphs Abs 1.3  0.7 - 4.0 (K/uL)   Monocytes Relative 4  3 - 12 (%)   Monocytes Absolute 1.0  0.1 - 1.0 (K/uL)   Eosinophils Relative 0  0 - 5 (%)   Eosinophils Absolute 0.0  0.0 - 0.7 (K/uL)   Basophils Relative 0  0  - 1 (%)   Basophils Absolute 0.0  0.0 - 0.1 (K/uL)  COMPREHENSIVE METABOLIC PANEL      Component Value Range   Sodium 128 (*) 135 - 145 (mEq/L)   Potassium 4.8  3.5 - 5.1 (mEq/L)   Chloride 90 (*) 96 - 112 (mEq/L)   CO2 27  19 - 32 (mEq/L)   Glucose, Bld 145 (*) 70 - 99 (mg/dL)   BUN 54 (*) 6 - 23 (mg/dL)   Creatinine, Ser 8.11 (*) 0.50 - 1.35 (mg/dL)   Calcium 8.8  8.4 -  10.5 (mg/dL)   Total Protein 6.0  6.0 - 8.3 (g/dL)   Albumin 1.3 (*) 3.5 - 5.2 (g/dL)   AST 26  0 - 37 (U/L)   ALT 17  0 - 53 (U/L)   Alkaline Phosphatase 228 (*) 39 - 117 (U/L)   Total Bilirubin 2.0 (*) 0.3 - 1.2 (mg/dL)   GFR calc non Af Amer 46 (*) >90 (mL/min)   GFR calc Af Amer 54 (*) >90 (mL/min)  PROTIME-INR      Component Value Range   Prothrombin Time 24.0 (*) 11.6 - 15.2 (seconds)   INR 2.11 (*) 0.00 - 1.49   APTT      Component Value Range   aPTT 39 (*) 24 - 37 (seconds)  URINALYSIS, ROUTINE W REFLEX MICROSCOPIC      Component Value Range   Color, Urine AMBER (*) YELLOW    APPearance CLOUDY (*) CLEAR    Specific Gravity, Urine 1.020  1.005 - 1.030    pH 5.0  5.0 - 8.0    Glucose, UA NEGATIVE  NEGATIVE (mg/dL)   Hgb urine dipstick NEGATIVE  NEGATIVE    Bilirubin Urine NEGATIVE  NEGATIVE    Ketones, ur NEGATIVE  NEGATIVE (mg/dL)   Protein, ur NEGATIVE  NEGATIVE (mg/dL)   Urobilinogen, UA 1.0  0.0 - 1.0 (mg/dL)   Nitrite NEGATIVE  NEGATIVE    Leukocytes, UA MODERATE (*) NEGATIVE   URINE CULTURE      Component Value Range   Specimen Description URINE, CATHETERIZED     Special Requests NONE     Culture  Setup Time [id number]     Colony Count >=100,000 COLONIES/ML     Culture ESCHERICHIA COLI     Report Status 01/24/2012 FINAL     Organism ID, Bacteria ESCHERICHIA COLI    AMMONIA      Component Value Range   Ammonia 37  11 - 60 (umol/L)  SAMPLE TO BLOOD BANK      Component Value Range   Blood Bank Specimen SAMPLE AVAILABLE FOR TESTING     Sample Expiration 01/23/2012    LIPASE, BLOOD        Component Value Range   Lipase 12  11 - 59 (U/L)  URINE MICROSCOPIC-ADD ON      Component Value Range   WBC, UA 7-10  <3 (WBC/hpf)   Bacteria, UA MANY (*) RARE    Casts HYALINE CASTS (*) NEGATIVE   PREPARE FRESH FROZEN PLASMA      Component Value Range   Unit Number [id number]     Blood Component Type THAWED PLASMA     Unit division 00     Status of Unit ISSUED,FINAL     Transfusion Status OK TO TRANSFUSE     Unit Number [id number]     Blood Component Type THAWED PLASMA     Unit division 00     Status of Unit ISSUED,FINAL     Transfusion Status OK TO TRANSFUSE    HEMOGLOBIN A1C      Component Value Range   Hemoglobin A1C 5.5  <5.7 (%)   Mean Plasma Glucose 111  <117 (mg/dL)  PROTIME-INR      Component Value Range   Prothrombin Time 24.4 (*) 11.6 - 15.2 (seconds)   INR 2.15 (*) 0.00 - 1.49   HEMOGLOBIN AND HEMATOCRIT, BLOOD      Component Value Range   Hemoglobin 12.9 (*) 13.0 - 17.0 (g/dL)   HCT 08.6 (*) 57.8 - 52.0 (%)  HEMOGLOBIN AND HEMATOCRIT, BLOOD      Component Value Range   Hemoglobin 12.5 (*) 13.0 - 17.0 (g/dL)   HCT 84.1 (*) 32.4 - 52.0 (%)  HEMOGLOBIN AND HEMATOCRIT, BLOOD      Component Value Range   Hemoglobin 10.8 (*) 13.0 - 17.0 (g/dL)   HCT 40.1 (*) 02.7 - 52.0 (%)  MAGNESIUM      Component Value Range   Magnesium 2.1  1.5 - 2.5 (mg/dL)  GLUCOSE, CAPILLARY      Component Value Range   Glucose-Capillary 110 (*) 70 - 99 (mg/dL)  GLUCOSE, CAPILLARY      Component Value Range   Glucose-Capillary 101 (*) 70 - 99 (mg/dL)   Comment 1 Documented in Chart     Comment 2 Notify RN    HEMOGLOBIN AND HEMATOCRIT, BLOOD      Component Value Range   Hemoglobin 11.2 (*) 13.0 - 17.0 (g/dL)   HCT 25.3 (*) 66.4 - 52.0 (%)  AMMONIA      Component Value Range   Ammonia 22  11 - 60 (umol/L)  CBC      Component Value Range   WBC 15.3 (*) 4.0 - 10.5 (K/uL)   RBC 3.68 (*) 4.22 - 5.81 (MIL/uL)   Hemoglobin 11.0 (*) 13.0 - 17.0 (g/dL)   HCT 40.3 (*) 47.4 - 52.0  (%)   MCV 88.9  78.0 - 100.0 (fL)   MCH 29.9  26.0 - 34.0 (pg)   MCHC 33.6  30.0 - 36.0 (g/dL)   RDW 25.9 (*) 56.3 - 15.5 (%)   Platelets 161  150 - 400 (K/uL)  GLUCOSE, CAPILLARY      Component Value Range   Glucose-Capillary 176 (*) 70 - 99 (mg/dL)   Comment 1 Documented in Chart     Comment 2 Notify RN    PROTIME-INR      Component Value Range   Prothrombin Time 24.3 (*) 11.6 - 15.2 (seconds)   INR 2.14 (*) 0.00 - 1.49   HEMOGLOBIN AND HEMATOCRIT, BLOOD      Component Value Range   Hemoglobin 11.6 (*) 13.0 - 17.0 (g/dL)   HCT 87.5 (*) 64.3 - 52.0 (%)  GLUCOSE, CAPILLARY      Component Value Range   Glucose-Capillary 200 (*) 70 - 99 (mg/dL)   Comment 1 Notify RN    HEMOGLOBIN AND HEMATOCRIT, BLOOD      Component Value Range   Hemoglobin 11.7 (*) 13.0 - 17.0 (g/dL)   HCT 32.9 (*) 51.8 - 52.0 (%)  BASIC METABOLIC PANEL      Component Value Range   Sodium 129 (*) 135 - 145 (mEq/L)   Potassium 5.6 (*) 3.5 - 5.1 (mEq/L)   Chloride 91 (*) 96 - 112 (mEq/L)   CO2 27  19 - 32 (mEq/L)   Glucose, Bld 211 (*) 70 - 99 (mg/dL)   BUN 66 (*) 6 - 23 (mg/dL)   Creatinine, Ser 8.41 (*) 0.50 - 1.35 (mg/dL)   Calcium 8.8  8.4 - 66.0 (mg/dL)   GFR calc non Af Amer 41 (*) >90 (mL/min)   GFR calc Af Amer 47 (*) >90 (mL/min)  GLUCOSE, CAPILLARY      Component Value Range   Glucose-Capillary 187 (*) 70 - 99 (mg/dL)   Comment 1 Documented in Chart     Comment 2 Notify RN    AMMONIA      Component Value Range   Ammonia 50  11 - 60 (umol/L)  GLUCOSE,  CAPILLARY      Component Value Range   Glucose-Capillary 248 (*) 70 - 99 (mg/dL)   Comment 1 Documented in Chart     Comment 2 Notify RN    GLUCOSE, CAPILLARY      Component Value Range   Glucose-Capillary 245 (*) 70 - 99 (mg/dL)   Comment 1 Documented in Chart     Comment 2 Notify RN    PROTIME-INR      Component Value Range   Prothrombin Time 22.7 (*) 11.6 - 15.2 (seconds)   INR 1.96 (*) 0.00 - 1.49   BASIC METABOLIC PANEL       Component Value Range   Sodium 128 (*) 135 - 145 (mEq/L)   Potassium 5.0  3.5 - 5.1 (mEq/L)   Chloride 94 (*) 96 - 112 (mEq/L)   CO2 27  19 - 32 (mEq/L)   Glucose, Bld 209 (*) 70 - 99 (mg/dL)   BUN 63 (*) 6 - 23 (mg/dL)   Creatinine, Ser 1.61 (*) 0.50 - 1.35 (mg/dL)   Calcium 8.6  8.4 - 09.6 (mg/dL)   GFR calc non Af Amer 44 (*) >90 (mL/min)   GFR calc Af Amer 51 (*) >90 (mL/min)  CBC      Component Value Range   WBC 16.4 (*) 4.0 - 10.5 (K/uL)   RBC 3.49 (*) 4.22 - 5.81 (MIL/uL)   Hemoglobin 10.6 (*) 13.0 - 17.0 (g/dL)   HCT 04.5 (*) 40.9 - 52.0 (%)   MCV 88.5  78.0 - 100.0 (fL)   MCH 30.4  26.0 - 34.0 (pg)   MCHC 34.3  30.0 - 36.0 (g/dL)   RDW 81.1 (*) 91.4 - 15.5 (%)   Platelets 191  150 - 400 (K/uL)  AMMONIA      Component Value Range   Ammonia 82 (*) 11 - 60 (umol/L)  GLUCOSE, CAPILLARY      Component Value Range   Glucose-Capillary 262 (*) 70 - 99 (mg/dL)   Comment 1 Notify RN    HEMOGLOBIN AND HEMATOCRIT, BLOOD      Component Value Range   Hemoglobin 10.8 (*) 13.0 - 17.0 (g/dL)   HCT 78.2 (*) 95.6 - 52.0 (%)  HEMOGLOBIN AND HEMATOCRIT, BLOOD      Component Value Range   Hemoglobin 10.5 (*) 13.0 - 17.0 (g/dL)   HCT 21.3 (*) 08.6 - 52.0 (%)  GLUCOSE, CAPILLARY      Component Value Range   Glucose-Capillary 211 (*) 70 - 99 (mg/dL)   Comment 1 Notify RN    GLUCOSE, CAPILLARY      Component Value Range   Glucose-Capillary 228 (*) 70 - 99 (mg/dL)   Comment 1 Notify RN    PROTIME-INR      Component Value Range   Prothrombin Time 23.5 (*) 11.6 - 15.2 (seconds)   INR 2.05 (*) 0.00 - 1.49   CBC      Component Value Range   WBC 21.8 (*) 4.0 - 10.5 (K/uL)   RBC 3.67 (*) 4.22 - 5.81 (MIL/uL)   Hemoglobin 11.1 (*) 13.0 - 17.0 (g/dL)   HCT 57.8 (*) 46.9 - 52.0 (%)   MCV 89.6  78.0 - 100.0 (fL)   MCH 30.2  26.0 - 34.0 (pg)   MCHC 33.7  30.0 - 36.0 (g/dL)   RDW 62.9 (*) 52.8 - 15.5 (%)   Platelets 163  150 - 400 (K/uL)  BASIC METABOLIC PANEL      Component Value  Range  Sodium 127 (*) 135 - 145 (mEq/L)   Potassium 5.2 (*) 3.5 - 5.1 (mEq/L)   Chloride 95 (*) 96 - 112 (mEq/L)   CO2 25  19 - 32 (mEq/L)   Glucose, Bld 152 (*) 70 - 99 (mg/dL)   BUN 62 (*) 6 - 23 (mg/dL)   Creatinine, Ser 1.61 (*) 0.50 - 1.35 (mg/dL)   Calcium 8.7  8.4 - 09.6 (mg/dL)   GFR calc non Af Amer 54 (*) >90 (mL/min)   GFR calc Af Amer 62 (*) >90 (mL/min)  GLUCOSE, CAPILLARY      Component Value Range   Glucose-Capillary 182 (*) 70 - 99 (mg/dL)   Comment 1 Notify RN    GLUCOSE, CAPILLARY      Component Value Range   Glucose-Capillary 226 (*) 70 - 99 (mg/dL)   Comment 1 Notify RN    HEMOGLOBIN AND HEMATOCRIT, BLOOD      Component Value Range   Hemoglobin 10.8 (*) 13.0 - 17.0 (g/dL)   HCT 04.5 (*) 40.9 - 52.0 (%)  GLUCOSE, CAPILLARY      Component Value Range   Glucose-Capillary 189 (*) 70 - 99 (mg/dL)   Comment 1 Notify RN     Comment 2 Documented in Chart    GLUCOSE, CAPILLARY      Component Value Range   Glucose-Capillary 219 (*) 70 - 99 (mg/dL)   Comment 1 Documented in Chart     Comment 2 Notify RN       Tabytha Gradillas,D Caryn Bee

## 2012-01-24 NOTE — Progress Notes (Signed)
Subjective: " I slept pretty good last night but it is hard to get comfortable." Reports pain management adequate for abd pain.  Objective: Vital signs Filed Vitals:   01/23/12 1047 01/23/12 1350 01/23/12 2200 01/24/12 0551  BP: 113/78 117/70 115/79 108/71  Pulse: 105 98 108 101  Temp:  98.7 F (37.1 C) 98.5 F (36.9 C) 98.2 F (36.8 C)  TempSrc:  Oral Oral Oral  Resp:  16 18 20   Height:      Weight:    133 kg (293 lb 3.4 oz)  SpO2:  95% 93% 92%   Weight change: 2 kg (4 lb 6.6 oz) Last BM Date: 01/23/12  Intake/Output from previous day: 02/25 0701 - 02/26 0700 In: 1080 [P.O.:1080] Out: 1251 [Urine:1250; Stool:1] Total I/O In: 120 [P.O.:120] Out: -    Physical Exam: General: somewhat drowsy but awake, oriented x3, in no acute distress. HEENT: No bruits, no goiter. Mucus membranes moist/pink Heart: Regular rate and rhythm, without murmurs, rubs, gallops. Lungs: Normal effort. Breath sounds clear to auscultation bilaterally. No wheeze, rhonchi Abdomen:Obese,  Soft, nontender, nondistended, positive bowel sounds. Large bruise over abdomen Extremities: No clubbing cyanosis or edema with positive pedal pulses. 1-2+edema Neuro: Grossly intact, nonfocal. Speech slow but clear    Lab Results: Basic Metabolic Panel:  Basename 01/24/12 0430 01/23/12 0212 01/21/12 0918  NA 127* 128* --  K 5.2* 5.0 --  CL 95* 94* --  CO2 25 27 --  GLUCOSE 152* 209* --  BUN 62* 63* --  CREATININE 1.38* 1.62* --  CALCIUM 8.7 8.6 --  MG -- -- 2.1  PHOS -- -- --   Liver Function Tests: No results found for this basename: AST:2,ALT:2,ALKPHOS:2,BILITOT:2,PROT:2,ALBUMIN:2 in the last 72 hours No results found for this basename: LIPASE:2,AMYLASE:2 in the last 72 hours  Basename 01/23/12 0211 01/22/12 1030  AMMONIA 82* 50   CBC:  Basename 01/24/12 0430 01/23/12 2140 01/23/12 0212  WBC 21.8* -- 16.4*  NEUTROABS -- -- --  HGB 11.1* 10.5* --  HCT 32.9* 30.8* --  MCV 89.6 -- 88.5  PLT  163 -- 191   Cardiac Enzymes: No results found for this basename: CKTOTAL:3,CKMB:3,CKMBINDEX:3,TROPONINI:3 in the last 72 hours BNP: No results found for this basename: PROBNP:3 in the last 72 hours D-Dimer: No results found for this basename: DDIMER:2 in the last 72 hours CBG:  Basename 01/24/12 0717 01/23/12 2154 01/23/12 1713 01/23/12 1126 01/23/12 0733 01/22/12 2155  GLUCAP 189* 226* 182* 228* 211* 262*   Hemoglobin A1C: No results found for this basename: HGBA1C in the last 72 hours Fasting Lipid Panel: No results found for this basename: CHOL,HDL,LDLCALC,TRIG,CHOLHDL,LDLDIRECT in the last 72 hours Thyroid Function Tests: No results found for this basename: TSH,T4TOTAL,FREET4,T3FREE,THYROIDAB in the last 72 hours Anemia Panel: No results found for this basename: VITAMINB12,FOLATE,FERRITIN,TIBC,IRON,RETICCTPCT in the last 72 hours Coagulation:  Basename 01/24/12 0430 01/23/12 0212  LABPROT 23.5* 22.7*  INR 2.05* 1.96*   Urine Drug Screen: Drugs of Abuse     Component Value Date/Time   LABOPIA NONE DETECTED 08/28/2011 0933   COCAINSCRNUR NONE DETECTED 08/28/2011 0933   LABBENZ NONE DETECTED 08/28/2011 0933   AMPHETMU NONE DETECTED 08/28/2011 0933   THCU NONE DETECTED 08/28/2011 0933   LABBARB NONE DETECTED 08/28/2011 0933    Alcohol Level: No results found for this basename: ETH:2 in the last 72 hours Urinalysis: No results found for this basename: COLORURINE:2,APPERANCEUR:2,LABSPEC:2,PHURINE:2,GLUCOSEU:2,HGBUR:2,BILIRUBINUR:2,KETONESUR:2,PROTEINUR:2,UROBILINOGEN:2,NITRITE:2,LEUKOCYTESUR:2 in the last 72 hours Misc. Labs:  Recent Results (from the past 240 hour(s))  URINE  CULTURE     Status: Normal   Collection Time   01/20/12  8:16 PM      Component Value Range Status Comment   Specimen Description URINE, CATHETERIZED   Final    Special Requests NONE   Final    Culture  Setup Time [id number]   Final    Colony Count >=100,000 COLONIES/ML   Final    Culture  ESCHERICHIA COLI   Final    Report Status 01/24/2012 FINAL   Final    Organism ID, Bacteria ESCHERICHIA COLI   Final     Studies/Results: Ct Abdomen Pelvis W Contrast  01/23/2012  *RADIOLOGY REPORT*  Clinical Data: Large perinephric and retroperitoneal hematoma. History of cirrhosis, ascites.  Persistent severe pain.  CT ABDOMEN AND PELVIS WITH CONTRAST  Technique:  Multidetector CT imaging of the abdomen and pelvis was performed following the standard protocol during bolus administration of intravenous contrast.  Contrast: 80mL OMNIPAQUE IOHEXOL 300 MG/ML IV SOLN  Comparison: CT of the abdomen pelvis 01/21/2012  Findings: There are bilateral pleural effusions.  Left base consolidation/atelectasis again noted.  There is moderate ascites, slightly increased since previous exam. The contour of the liver is scalloped, consistent with history of cirrhosis.  Spleen is enlarged, consistent with portal venous hypertension.  Large splenorenal shunt again noted.  Again noted is perinephric hematoma on the left, measuring 13.4 by 10.1 cm.  This represents stability since the previous exam.  There is diffuse body wall edema, probably increased since the previous exam.  The right kidney has a normal appearance.  There is stranding surrounding the gallbladder.  There is layering debris within the gallbladder, consistent with layering sludge or stones.  The rugal fold pattern of the stomach appears prominent.  No evidence for bowel obstruction.  A Foley catheter is identified within the urinary bladder.  No evidence for abdominal aortic aneurysm or retroperitoneal adenopathy.  Within the pelvis, retroperitoneal collection of fluid is noted. This contains loculated gas, consistent with fistula, abscess, or prior procedure.  The appearance is similar to previous study.  IMPRESSION:  1.  Stable perinephric/subcapsular hematoma on the left. 2.  Stable retroperitoneal left pelvic collection, containing locules of gas. 3.   Cirrhosis, ascites.  Ascites has increased slightly. 4.  Large splenorenal shunt.  Original Report Authenticated By: Patterson Hammersmith, M.D.   Dg Chest Port 1 View  01/22/2012  *RADIOLOGY REPORT*  Clinical Data: Verified PICC line placement.  PORTABLE CHEST - 1 VIEW  Comparison: 01/20/2012  Findings: The right arm PICC line tip is poorly characterized due to body habitus and overlying spine.  The PICC line tip appears to be in the region of the right atrium.  Again noted is opacification and at the left lung base probably related to consolidation and/or pleural fluid.  Upper lungs are clear.  The trachea is midline.  IMPRESSION: Limited evaluation of the PICC line tip.  The tip is probably in the region of the right atrium.  Stable opacification at the left lung base suggestive for consolidation and pleural fluid.  Original Report Authenticated By: Richarda Overlie, M.D.    Medications: Scheduled Meds:   . calcitonin (salmon)  1 spray Alternating Nares q morning - 10a  . cefTRIAXone (ROCEPHIN)  IV  1 g Intravenous Q24H  . docusate sodium  100 mg Oral BID  . influenza  inactive virus vaccine  0.5 mL Intramuscular Once  . insulin aspart  0-20 Units Subcutaneous TID WC  . insulin aspart  0-5  Units Subcutaneous QHS  . insulin glargine  5 Units Subcutaneous QHS  . lactulose  30 g Oral TID  . levothyroxine  50 mcg Oral Q0600  . metoprolol succinate  12.5 mg Oral Daily  . mulitivitamin with minerals  1 tablet Oral Daily  . nystatin  1 g Topical Daily  . pantoprazole  40 mg Oral Q1200  . rifaximin  550 mg Oral BID  . rOPINIRole  1 mg Oral QHS  . sodium chloride  10-40 mL Intracatheter Q12H  . sodium chloride  3 mL Intravenous Q12H  . sodium chloride  3 mL Intravenous Q12H   Continuous Infusions:  PRN Meds:.sodium chloride, HYDROmorphone (DILAUDID) injection, iohexol, magic mouthwash, sodium chloride, sodium chloride  Assessment/Plan:  Principal Problem:  *Retroperitoneal bleed Active Problems:   AODM  OBSTRUCTIVE SLEEP APNEA  Essential hypertension, benign  Cirrhosis  Morbid obesity  Anemia  Coagulopathy  UTI (lower urinary tract infection)  1. Retroperitoneal bleed (with large left perinephric hematoma and splenorenal shunt). Likely related to chronic liver disease and subsequent coagulopathy. Urology has reccommended conservative treatment.  Hgb today 11.1<--10,5 but this remains stable. VSS. Repeat CT 2/25 stable.  2. Coagulopathy in setting of chronic liver disease: Pt has received 2 units of FFP as well as Vit K. INR 2.05<--1.96 without evidence of worsening bleed. Continue to monitor.  3. E. Coli UTI: D4 Rocephin. Will treat 10 days. Sensitivities include rocephin 4. NASH with cirrhosis: Appreciate GI to assistanc. Continue Lactulose and Rifaximin. Continue holding diuretics with current issues but will need to resume at some point.  5. Hyperkalemia: mild. Home K+ supplement held Monitor 6. Anemia: acute blood loss with above a/w chronic disease. Stable.continue to monitor.  7. Leukocytosis: WBC 21.8< 16.4<--15.3<--22.1. Likely reactive with above a/w UTI. There was gas seen on repeat CT similar to initial CT   8. DM2: CBGs 182- 226s. Will increase pt's LA insulin slightly.Titrate as needed. Cont SSI.  9. CKD, stage III: at baseline  10. Morbid Obesity  11. CAD s/p stents 08/2011: no cardiac complaints  12. Prophylaxis: no Lovenox with #2      LOS: 4 days   [facility] M 01/24/2012, 8:25 AM

## 2012-01-25 ENCOUNTER — Inpatient Hospital Stay (HOSPITAL_COMMUNITY): Payer: 59

## 2012-01-25 LAB — APTT: aPTT: 40 seconds — ABNORMAL HIGH (ref 24–37)

## 2012-01-25 LAB — HEMOGLOBIN AND HEMATOCRIT, BLOOD
HCT: 31.2 % — ABNORMAL LOW (ref 39.0–52.0)
Hemoglobin: 10.5 g/dL — ABNORMAL LOW (ref 13.0–17.0)

## 2012-01-25 LAB — BASIC METABOLIC PANEL
BUN: 62 mg/dL — ABNORMAL HIGH (ref 6–23)
Calcium: 8.6 mg/dL (ref 8.4–10.5)
Chloride: 95 mEq/L — ABNORMAL LOW (ref 96–112)
Creatinine, Ser: 1.35 mg/dL (ref 0.50–1.35)
GFR calc Af Amer: 64 mL/min — ABNORMAL LOW (ref >90)

## 2012-01-25 LAB — CBC
HCT: 31.8 % — ABNORMAL LOW (ref 39.0–52.0)
MCH: 29.8 pg (ref 26.0–34.0)
MCHC: 33.6 g/dL (ref 30.0–36.0)
MCV: 88.6 fL (ref 78.0–100.0)
Platelets: 129 10*3/uL — ABNORMAL LOW (ref 150–400)
RDW: 16.6 % — ABNORMAL HIGH (ref 11.5–15.5)
WBC: 16.6 10*3/uL — ABNORMAL HIGH (ref 4.0–10.5)

## 2012-01-25 LAB — GLUCOSE, CAPILLARY: Glucose-Capillary: 162 mg/dL — ABNORMAL HIGH (ref 70–99)

## 2012-01-25 LAB — PROTIME-INR: INR: 1.74 — ABNORMAL HIGH (ref 0.00–1.49)

## 2012-01-25 MED ORDER — POTASSIUM CHLORIDE CRYS ER 20 MEQ PO TBCR
20.0000 meq | EXTENDED_RELEASE_TABLET | Freq: Two times a day (BID) | ORAL | Status: DC
  Administered 2012-01-25 – 2012-01-27 (×5): 20 meq via ORAL
  Filled 2012-01-25 (×8): qty 1

## 2012-01-25 MED ORDER — FENTANYL CITRATE 0.05 MG/ML IJ SOLN
INTRAMUSCULAR | Status: AC | PRN
  Administered 2012-01-25: 100 ug via INTRAVENOUS

## 2012-01-25 MED ORDER — FUROSEMIDE 40 MG PO TABS
60.0000 mg | ORAL_TABLET | Freq: Two times a day (BID) | ORAL | Status: DC
  Administered 2012-01-25 – 2012-01-27 (×5): 60 mg via ORAL
  Filled 2012-01-25 (×7): qty 1

## 2012-01-25 MED ORDER — FUROSEMIDE 10 MG/ML IJ SOLN
60.0000 mg | Freq: Two times a day (BID) | INTRAMUSCULAR | Status: DC
  Administered 2012-01-25: 60 mg via INTRAVENOUS
  Filled 2012-01-25 (×3): qty 6

## 2012-01-25 MED ORDER — MIDAZOLAM HCL 5 MG/5ML IJ SOLN
INTRAMUSCULAR | Status: AC | PRN
  Administered 2012-01-25: 2 mg via INTRAVENOUS

## 2012-01-25 NOTE — Procedures (Signed)
LLQ abscess drain times 2 Infected hematoma No comp

## 2012-01-25 NOTE — Progress Notes (Signed)
INITIAL ADULT NUTRITION ASSESSMENT Date: 01/25/2012   Time: 11:37 AM Reason for Assessment: consult  ASSESSMENT: Male 61 y.o.  Dx: Retroperitoneal bleed  Hx:  Past Medical History  Diagnosis Date  . Hypertension   . Type II or unspecified type diabetes mellitus without mention of complication, not stated as uncontrolled   . Thyroid disease   . Cirrhosis, non-alcoholic   . Morbid obesity   . Pancytopenia   . Fatty liver   . Coronary artery disease   . Myocardial infarction     "HEART INCIDENT " OCT 2012  . Angina     RARE USE OF NTG  . CHF (congestive heart failure)     JUNE 2012  . Arthritis   . Gout 35 YRS AGO  . Cellulitis of left leg   . Cirrhosis of liver     NONALCOHOLIC  . Nocturia   . Elevated BUN   . Elevated serum creatinine   . Serum ammonia increased   . Splenomegaly   . Platelets decreased   . Thyroid disorder     PT DOES NOT KNOW IF LOW OR HIGH  . Chronic kidney disease (CKD), stage III (moderate)   . Obstructive sleep apnea   . Hyponatremia   . Hyperkalemia   . Heart murmur   . Back pain     has compression fx   Past Surgical History  Procedure Date  . Coronary stent placement     2 CARDIAC STENTS OCT 08/2011  . Total hip arthroplasty 10/14/2011    Procedure: TOTAL HIP ARTHROPLASTY ANTERIOR APPROACH;  Surgeon: Kathryne Hitch;  Location: [facility];  Service: Orthopedics;  Laterality: Right;  . Joint replacement   . Appendectomy 1968  . Tonsillectomy 1957  . Cardiac catheterization 09/08/2011    Related Meds:  Scheduled Meds:   . calcitonin (salmon)  1 spray Alternating Nares q morning - 10a  . cefTRIAXone (ROCEPHIN)  IV  1 g Intravenous Q24H  . docusate sodium  100 mg Oral BID  . furosemide  60 mg Intravenous BID  . influenza  inactive virus vaccine  0.5 mL Intramuscular Once  . insulin aspart  0-20 Units Subcutaneous TID WC  . insulin aspart  0-5 Units Subcutaneous QHS  . insulin glargine  10 Units Subcutaneous QHS  . lactulose   30 g Oral TID  . levothyroxine  50 mcg Oral Q0600  . metoprolol succinate  12.5 mg Oral Daily  . mulitivitamin with minerals  1 tablet Oral Daily  . nystatin  1 g Topical Daily  . pantoprazole  40 mg Oral Q1200  . potassium chloride  20 mEq Oral BID  . rifaximin  550 mg Oral BID  . rOPINIRole  1 mg Oral QHS  . sodium chloride  10-40 mL Intracatheter Q12H  . sodium chloride  3 mL Intravenous Q12H  . sodium chloride  3 mL Intravenous Q12H  . DISCONTD: furosemide  40 mg Intravenous BID  . DISCONTD: furosemide  40 mg Oral BID   Continuous Infusions:  PRN Meds:.sodium chloride, HYDROmorphone (DILAUDID) injection, magic mouthwash, sodium chloride, sodium chloride   Ht: 5\' 10"  (177.8 cm)  Wt: 292 lb 8.8 oz (132.7 kg)  Ideal Wt: 74.5 kg % Ideal Wt: 175%  Usual Wt: Difficult to asses due to anasarca Recent wt loss- lost 90 lbs in 2012.   Body mass index is 41.98 kg/(m^2).  Food/Nutrition Related Hx: No nutrition needs per family.  Labs:  CMP     Component  Value Date/Time   NA 127* 01/25/2012 0510   K 4.5 01/25/2012 0510   CL 95* 01/25/2012 0510   CO2 27 01/25/2012 0510   GLUCOSE 198* 01/25/2012 0510   BUN 62* 01/25/2012 0510   CREATININE 1.35 01/25/2012 0510   CALCIUM 8.6 01/25/2012 0510   PROT 6.0 01/20/2012 2121   ALBUMIN 1.3* 01/20/2012 2121   AST 26 01/20/2012 2121   ALT 17 01/20/2012 2121   ALKPHOS 228* 01/20/2012 2121   BILITOT 2.0* 01/20/2012 2121   GFRNONAA 55* 01/25/2012 0510   GFRAA 64* 01/25/2012 0510    Intake: decreased to 15-20% of meals. Output:   Intake/Output Summary (Last 24 hours) at 01/25/12 1149 Last data filed at 01/25/12 1128  Gross per 24 hour  Intake 1562.5 ml  Output   1375 ml  Net  187.5 ml   Having Regular BMs.  1/day.  On colace.  Diet Order: NPO Previously CHO Modified, Med  Supplements/Tube Feeding: none at this time  IVF:    Estimated Nutritional Needs: adjusted for wt loss goals   Kcal: 1700-1800 kcal Protein: 90-100g Fluid: >2.6  L/day  Pt with chronic renal insufficiency and cirrhosis.  Currently NPO for procedure this afternoon.  Pt is confused at this time, not able to accurately recall diet hx.  Wife at bedside denies questions about nutrition-related goals.  She states they are not having issues with his nutrition at home because pt is has not been home, but has instead been at rehab.  Seems frustrated by this. Wife reports they have met with an output RD recently and tips and advice to follow at home if pt returns home. Stated my availability to assist if needed.    Pt's intake has been variable since admission.  Pt typically eats well, however PO 15-25% of meals.  Currently NPO for IR drainage, then diet to resume.  NUTRITION DIAGNOSIS: -Inadequate oral intake (NI-2.1).  Status: Ongoing  RELATED TO: AMS, pain  AS EVIDENCE BY: PO 15-25% of meals  MONITORING/EVALUATION(Goals): 1.  Food/Beverage; improvement in intake to 50-75% of meals  EDUCATION NEEDS: -Education needs addressed, mainly with family  INTERVENTION: 1.  General healthful diet; discussed home diet with family.  Wife denies questions or needs at this time.  Encourage intake once diet resumed.   2.  Modify diet; Pt currently on CHO Modified Med, may benefit from low sodium as well.  Dietitian #: [phone]  DOCUMENTATION CODES Per approved criteria  -Morbid Obesity    Loyce Dys [facility] 01/25/2012, 11:37 AM

## 2012-01-25 NOTE — Progress Notes (Signed)
Agree with above note by Toya Smothers, NP. Plan is for drainage of left perinephric hematoma today by IR as long has INR has dropped with FFP, to see if it is infected. Patient is very pleasant and other than Left flank pain, has no other significant complaints. Is on rocephin for a UTI, day 5/7. Hb is stable. Will continue to follow.

## 2012-01-25 NOTE — Progress Notes (Signed)
Patient ID: [name], male   DOB: December 17, 1950, 61 y.o.   MRN: [id number]  Gastroenterology Progress Note  Subjective: Anxious about procedure this am, mentating well, daughter at bedside. He says his abdominal pain is the same-though doesn't appear as uncomfortable.  Objective:  Vital signs in last 24 hours: Temp:  [97.3 F (36.3 C)-98.1 F (36.7 C)] 97.7 F (36.5 C) (02/27 0824) Pulse Rate:  [85-110] 93  (02/27 0824) Resp:  [16-20] 18  (02/27 0824) BP: (103-123)/(68-78) 113/77 mmHg (02/27 0824) SpO2:  [92 %-95 %] 95 % (02/27 0556) Weight:  [292 lb 8.8 oz (132.7 kg)] 292 lb 8.8 oz (132.7 kg) (02/27 0556) Last BM Date: 01/25/12 General:   Alert,  Well-developed,    in NAD Heart:  Regular rate and rhythm; no murmurs Pulm;decreased bs bases Abdomen:  Soft,  Morbidly obese,Bs+ tender diffusely, no rebound, echymosis across upper abdomen, no change, 2+ edema abdominal wall .Extremities2+ edema above knees  edema. Neurologic:  Alert and  oriented x4;  grossly normal neurologic, emotionally labile Psych:  Alert and cooperative. Normal mood and affect.  Intake/Output from previous day: 02/26 0701 - 02/27 0700 In: 870 [P.O.:480; I.V.:240; IV Piggyback:150] Out: 975 [Urine:975] Intake/Output this shift: Total I/O In: 262.5 [I.V.:250; Blood:12.5] Out: -   Lab Results:  Basename 01/25/12 0510 01/24/12 1850 01/24/12 1015 01/24/12 0430 01/23/12 0212  WBC 16.6* -- -- 21.8* 16.4*  HGB 10.7* 10.8* 10.8* -- --  HCT 31.8* 31.7* 32.1* -- --  PLT 129* -- -- 163 191   BMET  Basename 01/25/12 0510 01/24/12 0430 01/23/12 0212  NA 127* 127* 128*  K 4.5 5.2* 5.0  CL 95* 95* 94*  CO2 27 25 27   GLUCOSE 198* 152* 209*  BUN 62* 62* 63*  CREATININE 1.35 1.38* 1.62*  CALCIUM 8.6 8.7 8.6   LFT No results found for this basename: PROT,ALBUMIN,AST,ALT,ALKPHOS,BILITOT,BILIDIR,IBILI in the last 72 hours PT/INR  Basename 01/25/12 0510 01/24/12 0430  LABPROT 23.2* 23.5*  INR 2.02*  2.05*   Assessment / Plan: 61 yo male with NASH/Cirrhosis decompensated with Anasarca, chronic mild encephaphalopathy, admitted with acute perinephric and retroperitoneal bleed with large hematoma left - fluid collection containing gas concerning for abscess.  Pt is for IR aspiration/drainage  This am- receiving FFP currently-await results, need gram stain, cultures etc.  Have restarted Lasix 40 bid- was on 80 bid at home-will increase to 60 bid, and add Kdur.     LOS: 5 days   [name]  01/25/2012, 9:37 AM   Attending - Iva Boop, MD, [facility]   I have seen and evaluated the patient today also. Very complicated situation. He feels better after drainage of hematoma. Await results of studies to see if antibiotic changes needed. Diuretics increasing. Suspect some of hematoma drainage is ascites also. Watch Na    Iva Boop, MD, [facility] [phone] (pager) 01/25/2012 7:08 PM

## 2012-01-25 NOTE — Progress Notes (Signed)
Pt seen and examined and discussed with NP Karen Black. Agree with assessment and plan. 

## 2012-01-25 NOTE — ED Notes (Signed)
Patient is resting comfortably. 

## 2012-01-25 NOTE — Progress Notes (Signed)
Pt refused to wear CPAP for the night.  Pt encouraged to notify RN/ RT of any concerns.  

## 2012-01-25 NOTE — Progress Notes (Signed)
Subjective: Anxious about procedure today. Reports fair pain management  Objective: Vital signs Filed Vitals:   01/25/12 0707 01/25/12 0724 01/25/12 0824 01/25/12 0943  BP: 106/78 103/74 113/77 117/74  Pulse: 85 94 93 102  Temp: 97.4 F (36.3 C) 97.4 F (36.3 C) 97.7 F (36.5 C) 98 F (36.7 C)  TempSrc: Oral Oral Oral Oral  Resp: 16 16 18 18   Height:      Weight:      SpO2:       Weight change: -0.3 kg (-10.6 oz) Last BM Date: 01/25/12  Intake/Output from previous day: 02/26 0701 - 02/27 0700 In: 870 [P.O.:480; I.V.:240; IV Piggyback:150] Out: 975 [Urine:975] Total I/O In: 562.5 [I.V.:250; Blood:312.5] Out: -    Physical Exam: General: Alert, awake, oriented x3, in no acute distress. HEENT: No bruits, no goiter. Mucus membranes mouth dry slightly pale Heart: Regular rate and rhythm, without murmurs, rubs, gallops. Lungs: Normal effort. Breath sounds somewhat distant but clear to auscultation bilaterally. Abdomen:Morbidly obese, Tender throughout,  Soft,  positive bowel sounds. Ecchymosis over abdomen particularly right side. Petechiae left axilla Extremities: No clubbing cyanosis with positive pedal pulses. 1-2+ Edema Neuro: Grossly intact, nonfocal.    Lab Results: Basic Metabolic Panel:  Basename 01/25/12 0510 01/24/12 0430  NA 127* 127*  K 4.5 5.2*  CL 95* 95*  CO2 27 25  GLUCOSE 198* 152*  BUN 62* 62*  CREATININE 1.35 1.38*  CALCIUM 8.6 8.7  MG -- --  PHOS -- --   Liver Function Tests: No results found for this basename: AST:2,ALT:2,ALKPHOS:2,BILITOT:2,PROT:2,ALBUMIN:2 in the last 72 hours No results found for this basename: LIPASE:2,AMYLASE:2 in the last 72 hours  Basename 01/23/12 0211  AMMONIA 82*   CBC:  Basename 01/25/12 1030 01/25/12 0510 01/24/12 0430  WBC -- 16.6* 21.8*  NEUTROABS -- -- --  HGB 10.5* 10.7* --  HCT 31.2* 31.8* --  MCV -- 88.6 89.6  PLT -- 129* 163   Cardiac Enzymes: No results found for this basename:  CKTOTAL:3,CKMB:3,CKMBINDEX:3,TROPONINI:3 in the last 72 hours BNP: No results found for this basename: PROBNP:3 in the last 72 hours D-Dimer: No results found for this basename: DDIMER:2 in the last 72 hours CBG:  Basename 01/25/12 0749 01/24/12 2140 01/24/12 1642 01/24/12 1200 01/24/12 0717 01/23/12 2154  GLUCAP 181* 272* 245* 219* 189* 226*   Hemoglobin A1C: No results found for this basename: HGBA1C in the last 72 hours Fasting Lipid Panel: No results found for this basename: CHOL,HDL,LDLCALC,TRIG,CHOLHDL,LDLDIRECT in the last 72 hours Thyroid Function Tests: No results found for this basename: TSH,T4TOTAL,FREET4,T3FREE,THYROIDAB in the last 72 hours Anemia Panel: No results found for this basename: VITAMINB12,FOLATE,FERRITIN,TIBC,IRON,RETICCTPCT in the last 72 hours Coagulation:  Basename 01/25/12 0510 01/24/12 0430  LABPROT 23.2* 23.5*  INR 2.02* 2.05*   Urine Drug Screen: Drugs of Abuse     Component Value Date/Time   LABOPIA NONE DETECTED 08/28/2011 0933   COCAINSCRNUR NONE DETECTED 08/28/2011 0933   LABBENZ NONE DETECTED 08/28/2011 0933   AMPHETMU NONE DETECTED 08/28/2011 0933   THCU NONE DETECTED 08/28/2011 0933   LABBARB NONE DETECTED 08/28/2011 0933    Alcohol Level: No results found for this basename: ETH:2 in the last 72 hours Urinalysis: No results found for this basename: COLORURINE:2,APPERANCEUR:2,LABSPEC:2,PHURINE:2,GLUCOSEU:2,HGBUR:2,BILIRUBINUR:2,KETONESUR:2,PROTEINUR:2,UROBILINOGEN:2,NITRITE:2,LEUKOCYTESUR:2 in the last 72 hours Misc. Labs:  Recent Results (from the past 240 hour(s))  URINE CULTURE     Status: Normal   Collection Time   01/20/12  8:16 PM      Component Value Range Status  Comment   Specimen Description URINE, CATHETERIZED   Final    Special Requests NONE   Final    Culture  Setup Time [id number]   Final    Colony Count >=100,000 COLONIES/ML   Final    Culture ESCHERICHIA COLI   Final    Report Status 01/24/2012 FINAL   Final     Organism ID, Bacteria ESCHERICHIA COLI   Final     Studies/Results: Ct Abdomen Pelvis W Contrast  01/23/2012  *RADIOLOGY REPORT*  Clinical Data: Large perinephric and retroperitoneal hematoma. History of cirrhosis, ascites.  Persistent severe pain.  CT ABDOMEN AND PELVIS WITH CONTRAST  Technique:  Multidetector CT imaging of the abdomen and pelvis was performed following the standard protocol during bolus administration of intravenous contrast.  Contrast: 80mL OMNIPAQUE IOHEXOL 300 MG/ML IV SOLN  Comparison: CT of the abdomen pelvis 01/21/2012  Findings: There are bilateral pleural effusions.  Left base consolidation/atelectasis again noted.  There is moderate ascites, slightly increased since previous exam. The contour of the liver is scalloped, consistent with history of cirrhosis.  Spleen is enlarged, consistent with portal venous hypertension.  Large splenorenal shunt again noted.  Again noted is perinephric hematoma on the left, measuring 13.4 by 10.1 cm.  This represents stability since the previous exam.  There is diffuse body wall edema, probably increased since the previous exam.  The right kidney has a normal appearance.  There is stranding surrounding the gallbladder.  There is layering debris within the gallbladder, consistent with layering sludge or stones.  The rugal fold pattern of the stomach appears prominent.  No evidence for bowel obstruction.  A Foley catheter is identified within the urinary bladder.  No evidence for abdominal aortic aneurysm or retroperitoneal adenopathy.  Within the pelvis, retroperitoneal collection of fluid is noted. This contains loculated gas, consistent with fistula, abscess, or prior procedure.  The appearance is similar to previous study.  IMPRESSION:  1.  Stable perinephric/subcapsular hematoma on the left. 2.  Stable retroperitoneal left pelvic collection, containing locules of gas. 3.  Cirrhosis, ascites.  Ascites has increased slightly. 4.  Large splenorenal  shunt.  Original Report Authenticated By: Patterson Hammersmith, M.D.    Medications: Scheduled Meds:   . calcitonin (salmon)  1 spray Alternating Nares q morning - 10a  . cefTRIAXone (ROCEPHIN)  IV  1 g Intravenous Q24H  . docusate sodium  100 mg Oral BID  . furosemide  60 mg Intravenous BID  . influenza  inactive virus vaccine  0.5 mL Intramuscular Once  . insulin aspart  0-20 Units Subcutaneous TID WC  . insulin aspart  0-5 Units Subcutaneous QHS  . insulin glargine  10 Units Subcutaneous QHS  . lactulose  30 g Oral TID  . levothyroxine  50 mcg Oral Q0600  . metoprolol succinate  12.5 mg Oral Daily  . mulitivitamin with minerals  1 tablet Oral Daily  . nystatin  1 g Topical Daily  . pantoprazole  40 mg Oral Q1200  . potassium chloride  20 mEq Oral BID  . rifaximin  550 mg Oral BID  . rOPINIRole  1 mg Oral QHS  . sodium chloride  10-40 mL Intracatheter Q12H  . sodium chloride  3 mL Intravenous Q12H  . sodium chloride  3 mL Intravenous Q12H  . DISCONTD: furosemide  40 mg Intravenous BID  . DISCONTD: furosemide  40 mg Oral BID   Continuous Infusions:  PRN Meds:.sodium chloride, HYDROmorphone (DILAUDID) injection, magic mouthwash, sodium chloride, sodium chloride  Assessment/Plan:  Principal Problem:  *Retroperitoneal bleed Active Problems:  AODM  OBSTRUCTIVE SLEEP APNEA  Essential hypertension, benign  Cirrhosis  Morbid obesity  Anemia  Coagulopathy  UTI (lower urinary tract infection)  Anasarca  1. Retroperitoneal bleed (with large left perinephric hematoma and splenorenal shunt). Likely related to chronic liver disease and subsequent coagulopathy. Urology has reccommended conservative treatment. Hgb today 10.5 but this remains stable. Repeat CT yields fluid collection containing gas concerning for abscess. Will have IR aspiration/drainage per GI today.  2. Coagulopathy in setting of chronic liver disease: S/P 2 units of FFP as well as Vit K. INR 2.02 without evidence of  worsening bleed. 2 more units FFP in process to get INR down for anticipated CT guided drain/aspiration.  Continue to monitor.  3. E. Coli UTI: D4 Rocephin. Will treat 10 days. Sensitivities include rocephin  4. NASH with cirrhosis: decompensated with anasarca. Appreciate GI  assistance. Continue Lactulose and Rifaximin. Lasix restarted 2/26 at lower dose per GI.   5. Hyperkalemia: resolved. Continue to hold home K+ supplement held Monitor closely 6. Anemia: acute blood loss with above a/w chronic disease. Stable.continue to monitor.  7. Leukocytosis: WBC 16.6< 21.8< 16.4<--15.3<--22.1. Likely reactive with above a/w UTI. But also concern for abscess. See #1.  8. DM2: CBGs 181- 272s. Will increase pt's LA insulin slightly.Titrate as needed. Cont SSI.  9. CKD, stage III: at baseline  10. 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 Currently at baseline. Continue lactulose. Will check ammonia level in am.  10. Morbid Obesity  11. CAD s/p stents 08/2011: no cardiac complaints  12. Prophylaxis: no Lovenox with #2       LOS: 5 days   [facility] M 01/25/2012, 10:50 AM

## 2012-01-25 NOTE — Progress Notes (Signed)
ON-call GI MD Dickie La) called to clarify order for plasma administration. On-call MD notified of abnormal PT/INR- 2.02/23.2. Orders to give the 2u FFP as scheduled in orders at 0700 and to repeat PT/INR 1 hour after FFP finished.  Daphine Deutscher, Miranda Corning

## 2012-01-26 DIAGNOSIS — K6819 Other retroperitoneal abscess: Secondary | ICD-10-CM | POA: Diagnosis present

## 2012-01-26 LAB — BASIC METABOLIC PANEL
BUN: 61 mg/dL — ABNORMAL HIGH (ref 6–23)
Chloride: 96 mEq/L (ref 96–112)
Creatinine, Ser: 1.22 mg/dL (ref 0.50–1.35)
Glucose, Bld: 202 mg/dL — ABNORMAL HIGH (ref 70–99)
Potassium: 4.8 mEq/L (ref 3.5–5.1)

## 2012-01-26 LAB — GLUCOSE, CAPILLARY: Glucose-Capillary: 209 mg/dL — ABNORMAL HIGH (ref 70–99)

## 2012-01-26 LAB — CBC
HCT: 30.5 % — ABNORMAL LOW (ref 39.0–52.0)
Hemoglobin: 10.3 g/dL — ABNORMAL LOW (ref 13.0–17.0)
MCH: 30.1 pg (ref 26.0–34.0)
MCHC: 33.8 g/dL (ref 30.0–36.0)
RDW: 16.9 % — ABNORMAL HIGH (ref 11.5–15.5)

## 2012-01-26 LAB — PREPARE FRESH FROZEN PLASMA: Unit division: 0

## 2012-01-26 LAB — AMMONIA: Ammonia: 57 umol/L (ref 11–60)

## 2012-01-26 MED ORDER — PIPERACILLIN-TAZOBACTAM 3.375 G IVPB
3.3750 g | Freq: Three times a day (TID) | INTRAVENOUS | Status: DC
  Administered 2012-01-26 – 2012-01-27 (×4): 3.375 g via INTRAVENOUS
  Filled 2012-01-26 (×6): qty 50

## 2012-01-26 MED ORDER — INSULIN GLARGINE 100 UNIT/ML ~~LOC~~ SOLN
20.0000 [IU] | Freq: Every day | SUBCUTANEOUS | Status: DC
  Administered 2012-01-26: 20 [IU] via SUBCUTANEOUS

## 2012-01-26 NOTE — Progress Notes (Signed)
Subjective: "My left side hurts really bad." Facial grimace, weak moaning, eating an apple.  Objective: Vital signs Filed Vitals:   01/25/12 1608 01/25/12 1612 01/25/12 2207 01/26/12 0550  BP: 118/67 123/75 109/74 118/76  Pulse: 101 97 112 97  Temp:   99 F (37.2 C) 99.1 F (37.3 C)  TempSrc:   Oral Oral  Resp: 26 12 18 20   Height:      Weight:      SpO2: 98% 98% 95% 96%   Weight change:  Last BM Date: 01/25/12  Intake/Output from previous day: 02/27 0701 - 02/28 0700 In: 1895 [P.O.:720; I.V.:500; Blood:625; IV Piggyback:50] Out: 2315 [Urine:1275; Drains:1040] Total I/O In: 240 [P.O.:240] Out: 125 [Urine:125]   Physical Exam: General: Alert, awake, oriented x3, in no acute distress. Somewhat uncomfortable appearing.  HEENT: No bruits, no goiter. Mucus membranes moist/pink Heart: Regular rate and rhythm, without murmurs, rubs, gallops. Lungs: Normal effort but somewhat shallow. Breath sounds clear to auscultation bilaterally. No wheeze Abdomen:Morbidly obese. Soft,  Mild tenderness throughout but worse on left flank,  positive bowel sounds. Ecchymosis over abdomen somewhat improved. Dressing left abdomen with 2 drains intact. Draining bldy drainage. Some serous drainage from drainage site.  Extremities: No clubbing cyanosis  with positive pedal pulses. 1-2+ LEE Neuro: Grossly intact, nonfocal. Speech clear but slow.    Lab Results: Basic Metabolic Panel:  Basename 01/26/12 0415 01/25/12 0510  NA 130* 127*  K 4.8 4.5  CL 96 95*  CO2 29 27  GLUCOSE 202* 198*  BUN 61* 62*  CREATININE 1.22 1.35  CALCIUM 8.6 8.6  MG -- --  PHOS -- --   Liver Function Tests: No results found for this basename: AST:2,ALT:2,ALKPHOS:2,BILITOT:2,PROT:2,ALBUMIN:2 in the last 72 hours No results found for this basename: LIPASE:2,AMYLASE:2 in the last 72 hours  Basename 01/26/12 0415  AMMONIA 57   CBC:  Basename 01/26/12 0415 01/25/12 1030 01/25/12 0510  WBC 14.4* -- 16.6*    NEUTROABS -- -- --  HGB 10.3* 10.5* --  HCT 30.5* 31.2* --  MCV 89.2 -- 88.6  PLT 124* -- 129*   Cardiac Enzymes: No results found for this basename: CKTOTAL:3,CKMB:3,CKMBINDEX:3,TROPONINI:3 in the last 72 hours BNP: No results found for this basename: PROBNP:3 in the last 72 hours D-Dimer: No results found for this basename: DDIMER:2 in the last 72 hours CBG:  Basename 01/25/12 1728 01/25/12 1159 01/25/12 0749 01/24/12 2140 01/24/12 1642 01/24/12 1200  GLUCAP 124* 162* 181* 272* 245* 219*   Hemoglobin A1C: No results found for this basename: HGBA1C in the last 72 hours Fasting Lipid Panel: No results found for this basename: CHOL,HDL,LDLCALC,TRIG,CHOLHDL,LDLDIRECT in the last 72 hours Thyroid Function Tests: No results found for this basename: TSH,T4TOTAL,FREET4,T3FREE,THYROIDAB in the last 72 hours Anemia Panel: No results found for this basename: VITAMINB12,FOLATE,FERRITIN,TIBC,IRON,RETICCTPCT in the last 72 hours Coagulation:  Basename 01/26/12 0415 01/25/12 1345  LABPROT 22.3* 20.7*  INR 1.92* 1.74*   Urine Drug Screen: Drugs of Abuse     Component Value Date/Time   LABOPIA NONE DETECTED 08/28/2011 0933   COCAINSCRNUR NONE DETECTED 08/28/2011 0933   LABBENZ NONE DETECTED 08/28/2011 0933   AMPHETMU NONE DETECTED 08/28/2011 0933   THCU NONE DETECTED 08/28/2011 0933   LABBARB NONE DETECTED 08/28/2011 0933    Alcohol Level: No results found for this basename: ETH:2 in the last 72 hours Urinalysis: No results found for this basename: COLORURINE:2,APPERANCEUR:2,LABSPEC:2,PHURINE:2,GLUCOSEU:2,HGBUR:2,BILIRUBINUR:2,KETONESUR:2,PROTEINUR:2,UROBILINOGEN:2,NITRITE:2,LEUKOCYTESUR:2 in the last 72 hours Misc. Labs:  Recent Results (from the past 240 hour(s))  URINE CULTURE  Status: Normal   Collection Time   01/20/12  8:16 PM      Component Value Range Status Comment   Specimen Description URINE, CATHETERIZED   Final    Special Requests NONE   Final    Culture  Setup Time  [id number]   Final    Colony Count >=100,000 COLONIES/ML   Final    Culture ESCHERICHIA COLI   Final    Report Status 01/24/2012 FINAL   Final    Organism ID, Bacteria ESCHERICHIA COLI   Final     Studies/Results: No results found.  Medications: Scheduled Meds:   . calcitonin (salmon)  1 spray Alternating Nares q morning - 10a  . cefTRIAXone (ROCEPHIN)  IV  1 g Intravenous Q24H  . docusate sodium  100 mg Oral BID  . furosemide  60 mg Oral BID  . influenza  inactive virus vaccine  0.5 mL Intramuscular Once  . insulin aspart  0-20 Units Subcutaneous TID WC  . insulin aspart  0-5 Units Subcutaneous QHS  . insulin glargine  10 Units Subcutaneous QHS  . lactulose  30 g Oral TID  . levothyroxine  50 mcg Oral Q0600  . metoprolol succinate  12.5 mg Oral Daily  . mulitivitamin with minerals  1 tablet Oral Daily  . nystatin  1 g Topical Daily  . pantoprazole  40 mg Oral Q1200  . potassium chloride  20 mEq Oral BID  . rifaximin  550 mg Oral BID  . rOPINIRole  1 mg Oral QHS  . sodium chloride  10-40 mL Intracatheter Q12H  . sodium chloride  3 mL Intravenous Q12H  . sodium chloride  3 mL Intravenous Q12H  . DISCONTD: furosemide  40 mg Intravenous BID  . DISCONTD: furosemide  60 mg Intravenous BID   Continuous Infusions:  PRN Meds:.sodium chloride, fentaNYL, HYDROmorphone (DILAUDID) injection, magic mouthwash, midazolam, sodium chloride, sodium chloride  Assessment/Plan:  Principal Problem:  *Retroperitoneal bleed Active Problems:  AODM  OBSTRUCTIVE SLEEP APNEA  Essential hypertension, benign  Cirrhosis  Morbid obesity  Anemia  Coagulopathy  UTI (lower urinary tract infection)  Anasarca  1. Retroperitoneal bleed (with large left perinephric hematoma and splenorenal shunt). Likely related to chronic liver disease and subsequent coagulopathy. Urology has reccommended conservative treatment. Hgb today 10.3 but this remains stable. Repeat CT yields fluid collection containing  gas concerning for abscess.  S/P  IR aspiration/drainage 01/25/12. Fluid studies pending. WC trending down Temp 99 2. Coagulopathy in setting of chronic liver disease: S/P 2 units of FFP as well as Vit K. INR 2.02 without evidence of worsening bleed. 2 more units FFP on 01/25/12  get INR down for CT guided drain/aspiration. INR 1.9 today. Continue to monitor.  3. E. Coli UTI: D6 Rocephin. Will treat 7 days. Sensitivities include rocephin  4. NASH with cirrhosis: decompensated with anasarca. Appreciate GI assistance. Continue Lactulose and Rifaximin. Lasix restarted 2/26 at lower dose per GI.  5. Hyperkalemia: resolved. Continue to hold home K+ supplement held Monitor closely  6. Anemia: acute blood loss with above a/w chronic disease. Stable.continue to monitor.  7. Leukocytosis: WBC 14.4< 16.6< 21.8< 16.4<--15.3<--22.1. Likely reactive with above a/w UTI. But also concern for abscess. See #1. Fluid studies pending.  8. DM2: CBGs 154- 202s. Will increase pt's LA insulin slightly. Eating fairly well. Titrate as needed. Cont SSI.  9. CKD, stage III: at baseline  10. 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 Currently at baseline. Ammonia 57 this am.  Continue lactulose.  Will recheck ammonia as condition indicates.  10. Morbid Obesity  11. CAD s/p stents 08/2011: no cardiac complaints  12. Prophylaxis: no Lovenox with #2        LOS: 6 days   [facility] M 01/26/2012, 9:41 AM

## 2012-01-26 NOTE — Progress Notes (Signed)
Inpatient Diabetes Program Recommendations  AACE/ADA: New Consensus Statement on Inpatient Glycemic Control (2009)  Target Ranges:  Prepandial:   less than 140 mg/dL      Peak postprandial:   less than 180 mg/dL (1-2 hours)      Critically ill patients:  140 - 180 mg/dL   Reason for Visit: Hyperglycemia  Results for [name], [name] (MRN [id number]) as of 01/26/2012 09:02  Ref. Range 01/24/2012 16:42 01/24/2012 21:40 01/25/2012 07:49 01/25/2012 11:59 01/25/2012 17:28  Glucose-Capillary Latest Range: 70-99 mg/dL 782 (H) 956 (H) 213 (H) 162 (H) 124 (H)  Results for [name], [name] (MRN [id number]) as of 01/26/2012 09:02  Ref. Range 01/26/2012 04:15  Glucose Latest Range: 70-99 mg/dL 629 (H)    Inpatient Diabetes Program Recommendations Insulin - Basal: Increase Lantus to 20 units QHS Insulin - Meal Coverage: Add Novolog 3 units tidwc if pt eats > 50% meals  Note: Will follow.

## 2012-01-26 NOTE — Progress Notes (Signed)
Patient ID: [name], male   DOB: 1951-11-12, 61 y.o.   MRN: [id number] [facility] Progress Note  Subjective: In better spirits today,overall less pain. He is eating without difficulty  2 drains place yesterday in infected fluid collections- both draining blood and purulent material   Objective:  Vital signs in last 24 hours: Temp:  [97.3 F (36.3 C)-99.1 F (37.3 C)] 99.1 F (37.3 C) (02/28 0550) Pulse Rate:  [89-112] 97  (02/28 0550) Resp:  [10-26] 20  (02/28 0550) BP: (105-139)/(57-81) 118/76 mmHg (02/28 0550) SpO2:  [91 %-99 %] 96 % (02/28 0550) Last BM Date: 01/25/12 General:   Alert,  Well-developed,    in NAD Heart:  Regular rate and rhythm; no murmurs Pulm;clear ant Abdomen:  Soft, morbidly obese, less tender and nondistended. Normal bowel sounds, 2 drains in left flank Extremities:  2+  decreasedt edema. Neurologic:  Alert and  oriented x4;  grossly normal neurologically. Psych:  Alert and cooperative. Normal mood and affect.  Intake/Output from previous day: 02/27 0701 - 02/28 0700 In: 1895 [P.O.:720; I.V.:500; Blood:625; IV Piggyback:50] Out: 2315 [Urine:1275; Drains:1040] Intake/Output this shift: Total I/O In: 240 [P.O.:240] Out: 125 [Urine:125]  Lab Results:  Basename 01/26/12 0415 01/25/12 1030 01/25/12 0510 01/24/12 0430  WBC 14.4* -- 16.6* 21.8*  HGB 10.3* 10.5* 10.7* --  HCT 30.5* 31.2* 31.8* --  PLT 124* -- 129* 163   BMET  Basename 01/26/12 0415 01/25/12 0510 01/24/12 0430  NA 130* 127* 127*  K 4.8 4.5 5.2*  CL 96 95* 95*  CO2 29 27 25   GLUCOSE 202* 198* 152*  BUN 61* 62* 62*  CREATININE 1.22 1.35 1.38*  CALCIUM 8.6 8.6 8.7     Basename 01/26/12 0415 01/25/12 1345  LABPROT 22.3* 20.7*  INR 1.92* 1.74*    Assessment / Plan: #1   61 yo male with NASH/Cirrhosis  Decompensated #2  Infected hematomas-post acute  Retroperitoneal bleed -stable s/p drainage procedure yesterday. Cultures pending- clinically he looks better.On  Rocephin-will broaden to Zosyn #3 Anasarca- continue Lasix, less edema #4 chronic encephalopathy     LOS: 6 days   [name]  01/26/2012, 10:04 AM    Attending - Iva Boop, MD, [facility] I have taken hx and examined the patient also. Improved with drainage of retroperitoneal hematoma/abscess. We have added Zosyn and await Cx results. Continue diuretics.  Iva Boop, MD, [facility] [phone] (pager) 01/26/2012 2:09 PM

## 2012-01-26 NOTE — Progress Notes (Signed)
ANTIBIOTIC CONSULT NOTE - INITIAL  Pharmacy Consult for Zosyn Indication: Infected hematomas  Allergies  Allergen Reactions  . Crestor (Rosuvastatin Calcium)     unknown  . Statins Other (See Comments)    PT DOES NOT REMEMBER    Patient Measurements: Height: 5\' 10"  (177.8 cm) Weight: 292 lb 8.8 oz (132.7 kg) IBW/kg (Calculated) : 73   Vital Signs: Temp: 99.1 F (37.3 C) (02/28 0550) Temp src: Oral (02/28 0550) BP: 118/76 mmHg (02/28 0550) Pulse Rate: 97  (02/28 0550)  Labs:  Basename 01/26/12 0415 01/25/12 1030 01/25/12 0510 01/24/12 0430  WBC 14.4* -- 16.6* 21.8*  HGB 10.3* 10.5* 10.7* --  PLT 124* -- 129* 163  LABCREA -- -- -- --  CREATININE 1.22 -- 1.35 1.38*   Estimated Creatinine Clearance: 87.1 ml/min (by C-G formula based on Cr of 1.22).  Microbiology: Urine culture (2/22) Ecoli (R- amp/gent/septra, S-ancef/ctx/cipro/ntf/zosyn/tobra)  Assessment: 60YOM on day #6 ceftriaxone for Ecoli UTI.  Now, patient s/p IR procedure of CT guided abscess drain showing infected hematomas with leukocytosis.  GI c/s recommending to broaden antibiotic coverage with Zosyn.  SCr 1.22 (trending down slowly after bump), CrCl~87 mL/min.  Goal of Therapy:  Doses adjusted per renal clearance  Plan:  Zosyn 3.375g IV q8h (4 hour infusion time).  Clance Boll 01/26/2012,10:43 AM

## 2012-01-26 NOTE — Progress Notes (Signed)
Agree with note by Toya Smothers. Plan at this point is to await culture data from drainage of infected hematomas s/p acute retroperitoneal bleed. GI has broadened abx to zosyn. Leukocytosis has improved overnight. LTAC is assessing him for admission. Continue diuretics for his ascites and decompensated cirrhosis. Also has an e coli UTI that is being treated with both rocephin and now zosyn. Will follow.

## 2012-01-26 NOTE — Progress Notes (Signed)
Subjective: Patient states that he feels better since perinephric drainage procedure; gets very emotional at times; still with some soreness LLQ Objective: Vital signs in last 24 hours: Temp:  [97.5 F (36.4 C)-99.1 F (37.3 C)] 99.1 F (37.3 C) (02/28 0550) Pulse Rate:  [92-112] 97  (02/28 0550) Resp:  [10-26] 20  (02/28 0550) BP: (105-139)/(57-81) 118/76 mmHg (02/28 0550) SpO2:  [91 %-99 %] 96 % (02/28 0550) Last BM Date: 01/25/12  Intake/Output from previous day: 02/27 0701 - 02/28 0700 In: 1895 [P.O.:720; I.V.:500; Blood:625; IV Piggyback:50] Out: 2315 [Urine:1275; Drains:1040] Intake/Output this shift: Total I/O In: 240 [P.O.:240] Out: 125 [Urine:125]  LLQ drains intact, insertion sites ok, mildly tender to palpation, outputs form drains 240/800 cc's purulent, thick bloody fluid; drains flushed with sterile NS without difficulty, cx's/cytology pending  Lab Results:   Basename 01/26/12 0415 01/25/12 1030 01/25/12 0510  WBC 14.4* -- 16.6*  HGB 10.3* 10.5* --  HCT 30.5* 31.2* --  PLT 124* -- 129*   BMET  Basename 01/26/12 0415 01/25/12 0510  NA 130* 127*  K 4.8 4.5  CL 96 95*  CO2 29 27  GLUCOSE 202* 198*  BUN 61* 62*  CREATININE 1.22 1.35  CALCIUM 8.6 8.6   PT/INR  Basename 01/26/12 0415 01/25/12 1345  LABPROT 22.3* 20.7*  INR 1.92* 1.74*   ABG No results found for this basename: PHART:2,PCO2:2,PO2:2,HCO3:2 in the last 72 hours  Studies/Results: No results found. Results for orders placed during the hospital encounter of 01/20/12  URINE CULTURE     Status: Normal   Collection Time   01/20/12  8:16 PM      Component Value Range Status Comment   Specimen Description URINE, CATHETERIZED   Final    Special Requests NONE   Final    Culture  Setup Time [id number]   Final    Colony Count >=100,000 COLONIES/ML   Final    Culture ESCHERICHIA COLI   Final    Report Status 01/24/2012 FINAL   Final    Organism ID, Bacteria ESCHERICHIA COLI   Final     Anti-infectives: Anti-infectives     Start     Dose/Rate Route Frequency Ordered Stop   01/26/12 1200  piperacillin-tazobactam (ZOSYN) IVPB 3.375 g       3.375 g 12.5 mL/hr over 240 Minutes Intravenous Every 8 hours 01/26/12 1042     01/21/12 0600   cefTRIAXone (ROCEPHIN) 1 g in dextrose 5 % 50 mL IVPB  Status:  Discontinued        1 g 100 mL/hr over 30 Minutes Intravenous Every 24 hours 01/21/12 0214 01/26/12 1024   01/21/12 0230   rifaximin (XIFAXAN) tablet 550 mg        550 mg Oral 2 times daily 01/21/12 0221     01/20/12 2345   azithromycin (ZITHROMAX) 500 mg in dextrose 5 % 250 mL IVPB  Status:  Discontinued        500 mg 250 mL/hr over 60 Minutes Intravenous Every 24 hours 01/20/12 2332 01/20/12 2341   01/20/12 2345   vancomycin (VANCOCIN) IVPB 1000 mg/200 mL premix        1,000 mg 200 mL/hr over 60 Minutes Intravenous  Once 01/20/12 2342 01/21/12 0141   01/20/12 2345   cefTAZidime (FORTAZ) 2 g in dextrose 5 % 50 mL IVPB        2 g 100 mL/hr over 30 Minutes Intravenous  Once 01/20/12 2342 01/21/12 0035   01/20/12 2300   cefTRIAXone (  ROCEPHIN) 1 g in dextrose 5 % 50 mL IVPB  Status:  Discontinued        1 g 100 mL/hr over 30 Minutes Intravenous Every 24 hours 01/20/12 2252 01/20/12 2341          Assessment/Plan: s/p left perinephric/retroperitoneal abscess drainages 2/27; monitor labs, check cx's/cytology, cont with  NS flushes of drains    LOS: 6 days    Wilkins Elpers,D [name] 01/26/2012

## 2012-01-26 NOTE — Progress Notes (Signed)
UR completed 

## 2012-01-26 NOTE — Progress Notes (Signed)
Nutrition Follow-up  Diet Order:  CHO Mod/Low Sodium  Page received from nursing who report pt with difficulty ordering meals.  RD stopped by to assist pt in ordering a dinner meal that included his favorite foods.    Pt has had education several times in the past about the low sodium diet therapy, however pt struggles with craving and food preferences which limit his ability to achieve his very limited restrictions.  This frustrates pt, but we were able to work together.  Pt's intake has been variable since admission and overall decreased compared to his usual.  Meds: Scheduled Meds:   . calcitonin (salmon)  1 spray Alternating Nares q morning - 10a  . docusate sodium  100 mg Oral BID  . furosemide  60 mg Oral BID  . influenza  inactive virus vaccine  0.5 mL Intramuscular Once  . insulin aspart  0-20 Units Subcutaneous TID WC  . insulin aspart  0-5 Units Subcutaneous QHS  . insulin glargine  20 Units Subcutaneous QHS  . lactulose  30 g Oral TID  . levothyroxine  50 mcg Oral Q0600  . metoprolol succinate  12.5 mg Oral Daily  . mulitivitamin with minerals  1 tablet Oral Daily  . nystatin  1 g Topical Daily  . pantoprazole  40 mg Oral Q1200  . piperacillin-tazobactam (ZOSYN)  IV  3.375 g Intravenous Q8H  . potassium chloride  20 mEq Oral BID  . rifaximin  550 mg Oral BID  . rOPINIRole  1 mg Oral QHS  . sodium chloride  10-40 mL Intracatheter Q12H  . sodium chloride  3 mL Intravenous Q12H  . sodium chloride  3 mL Intravenous Q12H  . DISCONTD: cefTRIAXone (ROCEPHIN)  IV  1 g Intravenous Q24H  . DISCONTD: furosemide  60 mg Intravenous BID  . DISCONTD: insulin glargine  10 Units Subcutaneous QHS   Continuous Infusions:  PRN Meds:.sodium chloride, fentaNYL, HYDROmorphone (DILAUDID) injection, magic mouthwash, midazolam, sodium chloride, sodium chloride  Labs:  CMP     Component Value Date/Time   NA 130* 01/26/2012 0415   K 4.8 01/26/2012 0415   CL 96 01/26/2012 0415   CO2 29  01/26/2012 0415   GLUCOSE 202* 01/26/2012 0415   BUN 61* 01/26/2012 0415   CREATININE 1.22 01/26/2012 0415   CALCIUM 8.6 01/26/2012 0415   PROT 6.0 01/20/2012 2121   ALBUMIN 1.3* 01/20/2012 2121   AST 26 01/20/2012 2121   ALT 17 01/20/2012 2121   ALKPHOS 228* 01/20/2012 2121   BILITOT 2.0* 01/20/2012 2121   GFRNONAA 62* 01/26/2012 0415   GFRAA 72* 01/26/2012 0415     Intake/Output Summary (Last 24 hours) at 01/26/12 1515 Last data filed at 01/26/12 1300  Gross per 24 hour  Intake   1232 ml  Output   2040 ml  Net   -808 ml    Weight Status:  Not re-weighed after visit to IR yesterday  Nutrition Dx:  Inadequate oral intake, improved since IR visit yesterday  Intervention:   1.  Meals/snacks; assisted pt in ordering dinner meal for this evening.  Discussed limitations of current diet order and goals for pt.  He is aware of his goals and agrees they are important, but struggles to actually achieve them due to his food preferences and cravings.  Monitor:   1.  Food/Beverage; improvement in intake to 50-75% of meals.  Met, continue.   Hoyt Koch Pager #:  [phone]

## 2012-01-27 ENCOUNTER — Inpatient Hospital Stay
Admission: AD | Admit: 2012-01-27 | Discharge: 2012-02-29 | Disposition: A | Discharge status: Hospice | Payer: Self-pay | Source: Ambulatory Visit | Attending: Internal Medicine | Admitting: Internal Medicine

## 2012-01-27 DIAGNOSIS — D689 Coagulation defect, unspecified: Secondary | ICD-10-CM

## 2012-01-27 DIAGNOSIS — K766 Portal hypertension: Secondary | ICD-10-CM

## 2012-01-27 DIAGNOSIS — R188 Other ascites: Secondary | ICD-10-CM

## 2012-01-27 DIAGNOSIS — K7460 Unspecified cirrhosis of liver: Secondary | ICD-10-CM

## 2012-01-27 DIAGNOSIS — K760 Fatty (change of) liver, not elsewhere classified: Secondary | ICD-10-CM

## 2012-01-27 LAB — BASIC METABOLIC PANEL
BUN: 54 mg/dL — ABNORMAL HIGH (ref 6–23)
Calcium: 8.6 mg/dL (ref 8.4–10.5)
Creatinine, Ser: 1.09 mg/dL (ref 0.50–1.35)
GFR calc Af Amer: 83 mL/min — ABNORMAL LOW (ref >90)
GFR calc non Af Amer: 71 mL/min — ABNORMAL LOW (ref >90)

## 2012-01-27 LAB — CBC
HCT: 31.4 % — ABNORMAL LOW (ref 39.0–52.0)
MCHC: 33.8 g/dL (ref 30.0–36.0)
MCV: 89 fL (ref 78.0–100.0)
RDW: 17.3 % — ABNORMAL HIGH (ref 11.5–15.5)

## 2012-01-27 LAB — GLUCOSE, CAPILLARY: Glucose-Capillary: 212 mg/dL — ABNORMAL HIGH (ref 70–99)

## 2012-01-27 MED ORDER — PIPERACILLIN-TAZOBACTAM 3.375 G IVPB: 3.3750 g | Freq: Three times a day (TID) | INTRAVENOUS | Status: AC

## 2012-01-27 MED ORDER — HEPARIN SOD (PORK) LOCK FLUSH 100 UNIT/ML IV SOLN
250.0000 [IU] | INTRAVENOUS | Status: AC | PRN
  Administered 2012-01-27: 500 [IU]

## 2012-01-27 MED ORDER — HYDROMORPHONE HCL PF 1 MG/ML IJ SOLN: 0.5000 mg | INTRAMUSCULAR | Status: AC | PRN

## 2012-01-27 MED ORDER — INSULIN GLARGINE 100 UNIT/ML ~~LOC~~ SOLN: 20.0000 [IU] | Freq: Every day | SUBCUTANEOUS | Status: AC

## 2012-01-27 MED ORDER — FUROSEMIDE 20 MG PO TABS: 60.0000 mg | ORAL_TABLET | Freq: Two times a day (BID) | ORAL | Status: AC

## 2012-01-27 MED ORDER — POTASSIUM CHLORIDE CRYS ER 20 MEQ PO TBCR: 20.0000 meq | EXTENDED_RELEASE_TABLET | Freq: Two times a day (BID) | ORAL | Status: AC

## 2012-01-27 NOTE — Progress Notes (Signed)
  Subjective: Left prerinephric abscess drain Rp abscess drain both placed 2/27 Intact; improving  Objective: Vital signs in last 24 hours: Temp:  [98.3 F (36.8 C)-98.7 F (37.1 C)] 98.7 F (37.1 C) (03/01 0607) Pulse Rate:  [91-109] 91  (03/01 0607) Resp:  [18-20] 18  (03/01 0607) BP: (105-118)/(71-81) 111/75 mmHg (03/01 0607) SpO2:  [90 %-93 %] 91 % (03/01 0607) Weight:  [293 lb 6.9 oz (133.1 kg)] 293 lb 6.9 oz (133.1 kg) (03/01 0607) Last BM Date: 01/25/12  Intake/Output from previous day: 02/28 0701 - 03/01 0700 In: 922 [P.O.:822; IV Piggyback:100] Out: 1220 [Urine:925; Drains:295] Intake/Output this shift: Total I/O In: 240 [P.O.:240] Out: 750 [Urine:750]  PE:  Afeb; VSS Wbc: 12.6 (14.4) Drain #1 most superior-Perinephric drain: 135 cc 2/28; 30 cc in bag now: brown and bloody Drain #2 most inferior- Retroperitoneal abscess drain: 160 cc 2/28; 40 cc in bag now: brown/bloody Sites clean and dry; NT; No bleeding  Lab Results:   [facility] 01/27/12 0418 01/26/12 0415  WBC 12.6* 14.4*  HGB 10.6* 10.3*  HCT 31.4* 30.5*  PLT 121* 124*   BMET  Basename 01/27/12 0418 01/26/12 0415  NA 130* 130*  K 4.5 4.8  CL 95* 96  CO2 28 29  GLUCOSE 205* 202*  BUN 54* 61*  CREATININE 1.09 1.22  CALCIUM 8.6 8.6   PT/INR  Basename 01/27/12 0418 01/26/12 0415  LABPROT 21.9* 22.3*  INR 1.88* 1.92*   ABG No results found for this basename: PHART:2,PCO2:2,PO2:2,HCO3:2 in the last 72 hours  Studies/Results: No results found.  Anti-infectives: Anti-infectives     Start     Dose/Rate Route Frequency Ordered Stop   01/26/12 1200  piperacillin-tazobactam (ZOSYN) IVPB 3.375 g       3.375 g 12.5 mL/hr over 240 Minutes Intravenous Every 8 hours 01/26/12 1042     01/21/12 0600   cefTRIAXone (ROCEPHIN) 1 g in dextrose 5 % 50 mL IVPB  Status:  Discontinued        1 g 100 mL/hr over 30 Minutes Intravenous Every 24 hours 01/21/12 0214 01/26/12 1024   01/21/12 0230   rifaximin  (XIFAXAN) tablet 550 mg        550 mg Oral 2 times daily 01/21/12 0221     01/20/12 2345   azithromycin (ZITHROMAX) 500 mg in dextrose 5 % 250 mL IVPB  Status:  Discontinued        500 mg 250 mL/hr over 60 Minutes Intravenous Every 24 hours 01/20/12 2332 01/20/12 2341   01/20/12 2345   vancomycin (VANCOCIN) IVPB 1000 mg/200 mL premix        1,000 mg 200 mL/hr over 60 Minutes Intravenous  Once 01/20/12 2342 01/21/12 0141   01/20/12 2345   cefTAZidime (FORTAZ) 2 g in dextrose 5 % 50 mL IVPB        2 g 100 mL/hr over 30 Minutes Intravenous  Once 01/20/12 2342 01/21/12 0035   01/20/12 2300   cefTRIAXone (ROCEPHIN) 1 g in dextrose 5 % 50 mL IVPB  Status:  Discontinued        1 g 100 mL/hr over 30 Minutes Intravenous Every 24 hours 01/20/12 2252 01/20/12 2341          Assessment/Plan: S/p Left perinephric abscess drain        Retroperitoneal abscess drain= both placed 2/27 Drains intact; some better Good output Will need re imaging when output is less than 15cc/24hrs. We will follow   Levent Kornegay A 01/27/2012

## 2012-01-27 NOTE — Progress Notes (Signed)
Patient ID: [name], male   DOB: May 02, 1951, 61 y.o.   MRN: [id number] [facility] Progress Note  Subjective:  Just woke up, less abdominal pain overall  Significantly less drainage from both drains, still thick,purulent appearing with blood   Cultures- negative so far, no organisms on gram stain  Objective:  Vital signs in last 24 hours: Temp:  [98.3 F (36.8 C)-98.7 F (37.1 C)] 98.7 F (37.1 C) (03/01 0607) Pulse Rate:  [91-109] 91  (03/01 0607) Resp:  [18-20] 18  (03/01 0607) BP: (105-118)/(71-81) 111/75 mmHg (03/01 0607) SpO2:  [90 %-93 %] 91 % (03/01 0607) Weight:  [293 lb 6.9 oz (133.1 kg)] 293 lb 6.9 oz (133.1 kg) (03/01 0607) Last BM Date: 01/25/12 General:   Alert,  Well-developed,    in NAD Heart:  Regular rate and rhythm; no murmurs Pulm;decreased  bilat Abdomen:  Soft, , morbidly obese, less tender, no guarding , still has 2+ edema in flanks and lower abdomen EXT; 2+ edema above knees  Neurologic:  Alert and  oriented x4;   Emotionally labile  mentating a bit slower today, no flap Intake/Output from previous day: 02/28 0701 - 03/01 0700 In: 922 [P.O.:822; IV Piggyback:100] Out: 1220 [Urine:925; Drains:295] Intake/Output this shift: Total I/O In: 240 [P.O.:240] Out: 750 [Urine:750]  Lab Results:  Basename 01/27/12 0418 01/26/12 0415 01/25/12 1030 01/25/12 0510  WBC 12.6* 14.4* -- 16.6*  HGB 10.6* 10.3* 10.5* --  HCT 31.4* 30.5* 31.2* --  PLT 121* 124* -- 129*   BMET  Basename 01/27/12 0418 01/26/12 0415 01/25/12 0510  NA 130* 130* 127*  K 4.5 4.8 4.5  CL 95* 96 95*  CO2 28 29 27   GLUCOSE 205* 202* 198*  BUN 54* 61* 62*  CREATININE 1.09 1.22 1.35  CALCIUM 8.6 8.6 8.6   LFT No results found for this basename: PROT,ALBUMIN,AST,ALT,ALKPHOS,BILITOT,BILIDIR,IBILI in the last 72 hours PT/INR  Basename 01/27/12 0418 01/26/12 0415  LABPROT 21.9* 22.3*  INR 1.88* 1.92*    Assessment / Plan: #1 61 yo male with  NASH/cirrhosis-decompensated, with ansarca, and chronic encephalopathy #2 Acute retroperitoneal bleed with large hematoma  Perinephric and left pelvis-secondarily infected- now day #2 post drainage Drainage is decreasing Continue Zosyn, cultures pending IR to determine timing of follow up Ct Continue lasix 60 bid     LOS: 7 days   [name]  01/27/2012, 10:02 AM    [facility] Attending  He is improved. Moving to [facility]. We can see him there if requested.  I agree with Ms. Oswald Hillock findings, ass/plan. I have seen and evaluated the patien also.  Iva Boop, MD, [facility] [phone] (pager) 01/27/2012 5:13 PM

## 2012-01-27 NOTE — Evaluation (Signed)
Physical Therapy Evaluation Patient Details Name: [name] MRN: [id number] DOB: 03/22/1951 Today's Date: 01/27/2012  Problem List:  Patient Active Problem List  Diagnoses  . AODM  . OBSTRUCTIVE SLEEP APNEA  . Essential hypertension, benign  . Cirrhosis  . Coronary atherosclerosis of native coronary artery  . Hyperlipidemia  . Morbid obesity  . Arthritis pain of hip  . CRI (chronic renal insufficiency)  . NAFLD (nonalcoholic fatty liver disease)  . Anemia  . Hepatic encephalopathy  . Enterococcus UTI  . RLS (restless legs syndrome)  . Reflux  . Diabetes mellitus  . ARF (acute renal failure)  . E. coli UTI  . Hyponatremia  . Hypothyroidism  . FTT (failure to thrive) in adult  . Hyperkalemia  . Altered mental status  . Hypokalemia  . Coagulopathy  . Normocytic anemia  . Chronic pain syndrome  . Abnormal LFTs  . Retroperitoneal bleed  . UTI (lower urinary tract infection)  . Anasarca  . Retroperitoneal abscess    Past Medical History:  Past Medical History  Diagnosis Date  . Hypertension   . Type II or unspecified type diabetes mellitus without mention of complication, not stated as uncontrolled   . Thyroid disease   . Cirrhosis, non-alcoholic   . Morbid obesity   . Pancytopenia   . Fatty liver   . Coronary artery disease   . Myocardial infarction     "HEART INCIDENT " OCT 2012  . Angina     RARE USE OF NTG  . CHF (congestive heart failure)     JUNE 2012  . Arthritis   . Gout 35 YRS AGO  . Cellulitis of left leg   . Cirrhosis of liver     NONALCOHOLIC  . Nocturia   . Elevated BUN   . Elevated serum creatinine   . Serum ammonia increased   . Splenomegaly   . Platelets decreased   . Thyroid disorder     PT DOES NOT KNOW IF LOW OR HIGH  . Chronic kidney disease (CKD), stage III (moderate)   . Obstructive sleep apnea   . Hyponatremia   . Hyperkalemia   . Heart murmur   . Back pain     has compression fx   Past Surgical History:  Past Surgical  History  Procedure Date  . Coronary stent placement     2 CARDIAC STENTS OCT 08/2011  . Total hip arthroplasty 10/14/2011    Procedure: TOTAL HIP ARTHROPLASTY ANTERIOR APPROACH;  Surgeon: Kathryne Hitch;  Location: [facility];  Service: Orthopedics;  Laterality: Right;  . Joint replacement   . Appendectomy 1968  . Tonsillectomy 1957  . Cardiac catheterization 09/08/2011    PT Assessment/Plan/Recommendation PT Assessment Clinical Impression Statement: Pt admitted for retroperitoneal bleed and s/p 2 drains to left abdomen.  Pt with decreased cognition on evaluation, fearful of mobility, crying at times, and slow to process.  Pt would benefit from acute PT services in order to improve independence with bed mobility and improve strength to prepare for d/c to next venue (SNF vs. LTAC). PT Recommendation/Assessment: Patient will need skilled PT in the acute care venue PT Problem List: Decreased strength;Decreased activity tolerance;Decreased mobility;Decreased cognition;Pain;Obesity PT Therapy Diagnosis : Generalized weakness;Acute pain PT Plan PT Frequency: Min 3X/week PT Treatment/Interventions: DME instruction;Functional mobility training;Therapeutic activities;Therapeutic exercise;Patient/family education PT Recommendation Recommendations for Other Services:  (seen with OT) Follow Up Recommendations: Skilled nursing facility;LTACH Equipment Recommended: Defer to next venue PT Goals  Acute Rehab PT Goals PT  Goal Formulation: With patient Time For Goal Achievement: 7 days Pt will go Supine/Side to Sit: with mod assist PT Goal: Supine/Side to Sit - Progress: Goal set today Pt will go Sit to Supine/Side: with mod assist PT Goal: Sit to Supine/Side - Progress: Goal set today Pt will Perform Home Exercise Program: with min assist PT Goal: Perform Home Exercise Program - Progress: Goal set today  PT Evaluation Precautions/Restrictions  Precautions Precautions: Fall Prior  Functioning  Home Living Additional Comments: Pt was in rehab at [facility] prior to admission Prior Function Level of Independence: Needs assistance with ADLs;Needs assistance with tranfers Cognition Cognition Arousal/Alertness: Awake/alert Overall Cognitive Status: Difficult to assess Difficult to assess due to: other (comment) (Pt very emotionally labile) Orientation Level: Oriented to person;Oriented to place Cognition - Other Comments: Pt slow to process, very fearful of mobilization even in bed. Sensation/Coordination Sensation Light Touch: Appears Intact (able to feel hands of therapist while assessing ROM) Extremity Assessment RUE Assessment RUE Assessment: Within Functional Limits LUE Assessment LUE Assessment: Within Functional Limits RLE Strength RLE Overall Strength Comments: pt did not assist when verbally cued to help with ROM, difficult to assess LLE Strength LLE Overall Strength Comments: pt did not assist when verbally cued to help with ROM, difficult to assess Mobility (including Balance) Bed Mobility Bed Mobility: Yes Supine to Sit: 1: +2 Total assist;Patient percentage (comment);With rails Supine to Sit Details (indicate cue type and reason): pt=0%, attempted to assist to upright trunk however upon flexion pt reported he couldn't tolerate sitting so returned to comfortable supine position Sit to Supine: 1: +2 Total assist;Patient percentage (comment) Sit to Supine - Details (indicate cue type and reason): pt=0%    Exercise    End of Session PT - End of Session Activity Tolerance: Patient limited by pain Patient left: in bed;with call bell in reach General Behavior During Session: Other (comment) (emotional) Cognition: Impaired  Demetrius Barrell,KATHrine E 01/27/2012, 2:31 PM Pager: [phone]

## 2012-01-27 NOTE — Evaluation (Signed)
Occupational Therapy Evaluation Patient Details Name: [name] MRN: [id number] DOB: 08-15-1951 Today's Date: 01/27/2012  Problem List:  Patient Active Problem List  Diagnoses  . AODM  . OBSTRUCTIVE SLEEP APNEA  . Essential hypertension, benign  . Cirrhosis  . Coronary atherosclerosis of native coronary artery  . Hyperlipidemia  . Morbid obesity  . Arthritis pain of hip  . CRI (chronic renal insufficiency)  . NAFLD (nonalcoholic fatty liver disease)  . Anemia  . Hepatic encephalopathy  . Enterococcus UTI  . RLS (restless legs syndrome)  . Reflux  . Diabetes mellitus  . ARF (acute renal failure)  . E. coli UTI  . Hyponatremia  . Hypothyroidism  . FTT (failure to thrive) in adult  . Hyperkalemia  . Altered mental status  . Hypokalemia  . Coagulopathy  . Normocytic anemia  . Chronic pain syndrome  . Abnormal LFTs  . Retroperitoneal bleed  . UTI (lower urinary tract infection)  . Anasarca  . Retroperitoneal abscess    Past Medical History:  Past Medical History  Diagnosis Date  . Hypertension   . Type II or unspecified type diabetes mellitus without mention of complication, not stated as uncontrolled   . Thyroid disease   . Cirrhosis, non-alcoholic   . Morbid obesity   . Pancytopenia   . Fatty liver   . Coronary artery disease   . Myocardial infarction     "HEART INCIDENT " OCT 2012  . Angina     RARE USE OF NTG  . CHF (congestive heart failure)     JUNE 2012  . Arthritis   . Gout 35 YRS AGO  . Cellulitis of left leg   . Cirrhosis of liver     NONALCOHOLIC  . Nocturia   . Elevated BUN   . Elevated serum creatinine   . Serum ammonia increased   . Splenomegaly   . Platelets decreased   . Thyroid disorder     PT DOES NOT KNOW IF LOW OR HIGH  . Chronic kidney disease (CKD), stage III (moderate)   . Obstructive sleep apnea   . Hyponatremia   . Hyperkalemia   . Heart murmur   . Back pain     has compression fx   Past Surgical History:  Past  Surgical History  Procedure Date  . Coronary stent placement     2 CARDIAC STENTS OCT 08/2011  . Total hip arthroplasty 10/14/2011    Procedure: TOTAL HIP ARTHROPLASTY ANTERIOR APPROACH;  Surgeon: Kathryne Hitch;  Location: [facility];  Service: Orthopedics;  Laterality: Right;  . Joint replacement   . Appendectomy 1968  . Tonsillectomy 1957  . Cardiac catheterization 09/08/2011    OT Assessment/Plan/Recommendation OT Assessment Clinical Impression Statement: Pt is a 61 yo male w/multiple recent hospitalizations, most recently for retroperitoneal bleed is currently receiving rehab at [facility]. Pt presents w/a severe decline in BADL performance. Skilled OT recommended to maximize independence w/BADLs to decrease burden of care at next venue. OT Recommendation/Assessment: Patient will need skilled OT in the acute care venue OT Problem List: Decreased activity tolerance;Decreased knowledge of use of DME or AE;Decreased cognition;Pain OT Therapy Diagnosis : Generalized weakness OT Plan OT Frequency: Min 1X/week OT Treatment/Interventions: Self-care/ADL training;Therapeutic activities;DME and/or AE instruction;Patient/family education OT Recommendation Follow Up Recommendations: Skilled nursing facility Equipment Recommended: Defer to next venue Individuals Consulted Consulted and Agree with Results and Recommendations: Patient OT Goals Acute Rehab OT Goals OT Goal Formulation: With patient Time For Goal Achievement: 2 weeks  ADL Goals Additional ADL Goal #1: Pt will roll R & L in bed with min A to allow caregivers to provide peri care. ADL Goal: Additional Goal #1 - Progress: Goal set today Additional ADL Goal #2: Pt will complete supine<>sit with max A and tolerate sitting EOB x 8 min in prep for seated ADL. ADL Goal: Additional Goal #2 - Progress: Goal set today  OT Evaluation Precautions/Restrictions  Precautions Precautions: Fall Restrictions Weight Bearing Restrictions:  Yes RLE Weight Bearing: Weight bearing as tolerated LLE Weight Bearing: Weight bearing as tolerated Prior Functioning Home Living Additional Comments: Pt was in rehab at [facility] prior to admission Prior Function Level of Independence: Needs assistance with ADLs ADL ADL Eating/Feeding: Simulated;Set up Where Assessed - Eating/Feeding: Bed level Grooming: Simulated;Set up Where Assessed - Grooming: Supine, head of bed up Upper Body Bathing: Not assessed Lower Body Bathing: Not assessed Upper Body Dressing: Not assessed Lower Body Dressing: Not assessed Toilet Transfer: Not assessed Toileting - Clothing Manipulation: Not assessed Toileting - Hygiene: Not assessed Tub/Shower Transfer Method: Not assessed ADL Comments: Pt very tearful, fearful of any type of mobility. Vision/Perception    Cognition Cognition Arousal/Alertness: Awake/alert Overall Cognitive Status: Difficult to assess Difficult to assess due to: other (comment) (Pt very emotionally labile) Orientation Level: Oriented to person;Oriented to place Cognition - Other Comments: Pt slow to process, very fearful of mobilization even in bed. Sensation/Coordination   Extremity Assessment RUE Assessment RUE Assessment: Within Functional Limits LUE Assessment LUE Assessment: Within Functional Limits Mobility  Bed Mobility Bed Mobility: Yes Supine to Sit: 1: +2 Total assist;Patient percentage (comment);With rails (Pt 0%.) Supine to Sit Details (indicate cue type and reason): Pt unable to flex trunk to achieve an upright position. Began yelling NO,NO. Returned pt to supine position. Exercises   End of Session OT - End of Session Activity Tolerance: Patient limited by pain Patient left: in bed;with call bell in reach General Behavior During Session: Other (comment) (Emotionally labile) Cognition: Impaired   Korbin Mapps A, OTR/L [id number] 01/27/2012, 12:54 PM

## 2012-01-27 NOTE — Discharge Summary (Signed)
Physician Discharge Summary  Patient ID: TASHAWN LASWELL MRN: [id number] DOB/AGE: [id number] y.o.  Admit date: 01/20/2012 Discharge date: 01/27/2012  Primary Care Physician:  REED,TIFFANY L., DO, DO   Discharge Diagnoses:    Principal Problem:  *Retroperitoneal bleed Active Problems:  AODM  OBSTRUCTIVE SLEEP APNEA  Essential hypertension, benign  Cirrhosis  Morbid obesity  Anemia  Coagulopathy  UTI (lower urinary tract infection)  Anasarca  Retroperitoneal abscess    Medication List  As of 01/27/2012  1:51 PM   STOP taking these medications         aspirin 81 MG chewable tablet      insulin aspart 100 UNIT/ML injection      insulin detemir 100 UNIT/ML injection      lidocaine 5 %      nitroGLYCERIN 0.4 MG SL tablet         TAKE these medications         calcitonin (salmon) 200 UNIT/ACT nasal spray   Commonly known as: MIACALCIN/FORTICAL   Place 1 spray into the nose See admin instructions. 200 units spone spray Alternate nostrils every day      docusate sodium 100 MG capsule   Commonly known as: COLACE   Take 100 mg by mouth 2 (two) times daily.      furosemide 20 MG tablet   Commonly known as: LASIX   Take 3 tablets (60 mg total) by mouth 2 (two) times daily.      HYDROmorphone 1 MG/ML Soln injection   Commonly known as: DILAUDID   Inject 0.5 mLs (0.5 mg total) into the vein every 4 (four) hours as needed for pain.      insulin glargine 100 UNIT/ML injection   Commonly known as: LANTUS   Inject 20 Units into the skin at bedtime.      lactulose 10 GM/15ML solution   Commonly known as: CHRONULAC   Take 45 mLs (30 g total) by mouth 3 (three) times daily.      levothyroxine 50 MCG tablet   Commonly known as: SYNTHROID, LEVOTHROID   Take 50 mcg by mouth every morning.      magic mouthwash Soln   Swish and spit 5 mLs 4 (four) times daily as needed.      metoprolol succinate 25 MG 24 hr tablet   Commonly known as: TOPROL-XL   12.5 mg daily.     mulitivitamin with minerals Tabs   Take 1 tablet by mouth daily.      nystatin 100000 UNIT/GM Powd   Apply 1 g topically daily. topically      pantoprazole 40 MG tablet   Commonly known as: PROTONIX   Take 40 mg by mouth daily at 12 noon.      piperacillin-tazobactam 3-0.375 GM/50ML IVPB   Commonly known as: ZOSYN   Inject 50 mLs (3.375 g total) into the vein every 8 (eight) hours.      potassium chloride SA 20 MEQ tablet   Commonly known as: K-DUR,KLOR-CON   Take 1 tablet (20 mEq total) by mouth 2 (two) times daily.      rifaximin 550 MG Tabs   Commonly known as: XIFAXAN   Take 550 mg by mouth 2 (two) times daily.      rOPINIRole 1 MG tablet   Commonly known as: REQUIP   Take 1 mg by mouth at bedtime.             Disposition and Follow-up:  Will be discharged to [facility] today for continued management of his retroperitoneal abscesses, likely long-term antibiotics and fulminant liver failure.  Consults:  GI Dr. Leone Payor, Interventional Radiology   Significant Diagnostic Studies:  Dg Chest 2 View  01/20/2012  *RADIOLOGY REPORT*  Clinical Data: Chest pain.  CHEST - 2 VIEW  Comparison: 01/04/2012  Findings: Left lower lobe airspace opacity noted.  Small left pleural effusion suspected.  No confluent opacity on the right. Heart is mildly enlarged.  No acute bony abnormality.  IMPRESSION: Mild cardiomegaly.  Left lower lobe atelectasis or consolidation with small left effusion.  Original Report Authenticated By: Cyndie Chime, M.D.   Ct Abdomen Pelvis W Contrast  01/21/2012  *RADIOLOGY REPORT*  Clinical Data: Abdominal pain.  History of cirrhosis.  CT ABDOMEN AND PELVIS WITH CONTRAST  Technique:  Multidetector CT imaging of the abdomen and pelvis was performed following the standard protocol during bolus administration of intravenous contrast.  Contrast: OMNIPAQUE IOHEXOL 300 MG/ML IV SOLN  Comparison: 05/28/2011  Findings: Small left pleural effusion with compressive  atelectasis in the left lower lobe.  No effusion on the right.  Coronary artery calcifications are present.  Heart is normal size.  Small pericardial effusion.  Changes of cirrhosis.  The liver is shrunken and nodular.  Moderate ascites.  Spleen is enlarged.  There is a large left perinephric hematoma, possibly subcapsular. This measures 14 x 10 cm.  This compresses and displaces the left kidney.  Probable slight decreased perfusion of the left kidney relative to the right kidney.  The hematoma also extends into the left retroperitoneum more inferiorly along the anterior aspect of the iliopsoas muscle.  Locules of gas are noted within this component of the retroperitoneal hematoma of unknown etiology.  Large and small bowel grossly unremarkable.  Large spontaneous splenorenal shunt noted with dilated tortuous vessels extending into the pelvis.  Aorta is normal caliber.  Small bilateral nonobstructing renal stones.  No ureteral stones or hydronephrosis.  No acute bony abnormality.  IMPRESSION: Large left perinephric subcapsular hematoma with compression and displacement of the left kidney.  There is mild or early decrease in perfusion to the left kidney (page kidney).  The hematoma also extends into the left retroperitoneum.  Locules of gas are seen within the retroperitoneal hematoma in the left side of the pelvis of unknown etiology.  Changes of cirrhosis with moderate ascites, splenomegaly and large spontaneous splenorenal shunt.  Small left pleural effusion with compressive atelectasis in the left lower lobe.  Bilateral nephrolithiasis.  Critical Value/emergent results were called by telephone at the time of interpretation on 01/21/2012  at 12:45 a.m.  to  the ER physician at [facility], who verbally acknowledged these results.  Original Report Authenticated By: Cyndie Chime, M.D.    Brief H and P: For complete details please refer to admission H and P, but in brief SATYA BUTTRAM is an 61 y.o. male  with history of [name], hypertension, coagulopathy from liver cirrhosis, recurrent abdominal ascites, sleep apnea, morbid obesity, coronary disease status post prior MI, gout, congestive heart failure, thyroid disease, presents to the emergency room with abdominal pain and abdominal bruising. He did not recall falling (he's usually bedbound), no black stool bloody stool, nausea, vomiting, chest pain, shortness of breath, or any other symptomology. Evaluation in emergency room included a urinalysis positive for UTI, and elevated white count 22,000 with hemoglobin of 13.3 g per decaliter. He has unremarkable liver function tests, but his INR is elevated at 2.1. His lites spaces  normal. His chest x-ray shows questionable left consolidation. He denied any pulmonary symptoms. Hospitalist was asked to admit him for UTI, pneumonia, and possible peritonitis.     Hospital Course:  Principal Problem:  *Retroperitoneal bleed Active Problems:  AODM  OBSTRUCTIVE SLEEP APNEA  Essential hypertension, benign  Cirrhosis  Morbid obesity  Anemia  Coagulopathy  UTI (lower urinary tract infection)  Anasarca  Retroperitoneal abscess   #1 Retroperitoneal Bleed with subsequent infection: Was coagulopathic on admission (related to his liver failure). Has not required blood transfusions. CT showed fluid collections containing gas concerning for abscess formation. IR placed two drains on 2/27. Culture data negative to date. IR planning on repeat CT once drain output decreases. On zosyn. WBCs trending down. Afebrile currently.  #2 Coagulopathy in setting of liver disease: received Vit k and FFP. Not on coumadin.  #3 E Coli UTI: Today completes 7 days of treatment. Should not require further antibiotics for this issue alone.  #4 NASH with cirrhosis: Decompensated with severe anasarca. On diuretics, lactulose (although no signs of hepatic encephalopathy). On fifaximin. GI, Dr. Leone Payor, following.  Time spent on  Discharge: Greater than 30 minutes.  SignedChaya Jan [facility] Pager: [phone] 01/27/2012, 1:51 PM

## 2012-01-27 NOTE — Progress Notes (Signed)
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 Patient thrilled about going to LTAC. Patient going to room 5742 and Dr. Zenda Alpers will be Attending. Nursing aware. [facility] chart on shadow chart. Boneta Lucks with [facility] helped facilitate transfer to [facility] by faxing dc summary and med rec. Nursing to call report and call transportation.  [place], [place] [phone]

## 2012-01-28 LAB — DIFFERENTIAL
Basophils Absolute: 0 10*3/uL (ref 0.0–0.1)
Eosinophils Relative: 2 % (ref 0–5)
Lymphocytes Relative: 8 % — ABNORMAL LOW (ref 12–46)
Lymphs Abs: 1.1 10*3/uL (ref 0.7–4.0)
Monocytes Relative: 7 % (ref 3–12)
Neutro Abs: 11.5 10*3/uL — ABNORMAL HIGH (ref 1.7–7.7)

## 2012-01-28 LAB — HEMOGLOBIN A1C
Hgb A1c MFr Bld: 5.8 % — ABNORMAL HIGH (ref <5.7)
Mean Plasma Glucose: 120 mg/dL — ABNORMAL HIGH (ref <117)

## 2012-01-28 LAB — COMPREHENSIVE METABOLIC PANEL
AST: 27 U/L (ref 0–37)
Albumin: 1.4 g/dL — ABNORMAL LOW (ref 3.5–5.2)
Chloride: 94 mEq/L — ABNORMAL LOW (ref 96–112)
Creatinine, Ser: 1.17 mg/dL (ref 0.50–1.35)
Potassium: 4.7 mEq/L (ref 3.5–5.1)
Total Bilirubin: 1.9 mg/dL — ABNORMAL HIGH (ref 0.3–1.2)

## 2012-01-28 LAB — FERRITIN: Ferritin: 596 ng/mL — ABNORMAL HIGH (ref 22–322)

## 2012-01-28 LAB — PROTIME-INR: Prothrombin Time: 24.4 seconds — ABNORMAL HIGH (ref 11.6–15.2)

## 2012-01-28 LAB — CBC
HCT: 31.4 % — ABNORMAL LOW (ref 39.0–52.0)
MCV: 89.5 fL (ref 78.0–100.0)
Platelets: 122 10*3/uL — ABNORMAL LOW (ref 150–400)
RBC: 3.51 MIL/uL — ABNORMAL LOW (ref 4.22–5.81)
WBC: 13.9 10*3/uL — ABNORMAL HIGH (ref 4.0–10.5)

## 2012-01-28 LAB — MAGNESIUM: Magnesium: 2 mg/dL (ref 1.5–2.5)

## 2012-01-28 LAB — TSH: TSH: 4.045 u[IU]/mL (ref 0.350–4.500)

## 2012-01-28 LAB — SEDIMENTATION RATE: Sed Rate: 44 mm/hr — ABNORMAL HIGH (ref 0–16)

## 2012-01-28 LAB — PROCALCITONIN: Procalcitonin: 0.27 ng/mL

## 2012-01-29 LAB — BODY FLUID CULTURE

## 2012-01-30 ENCOUNTER — Other Ambulatory Visit (HOSPITAL_COMMUNITY): Payer: Self-pay

## 2012-01-30 ENCOUNTER — Telehealth: Payer: Self-pay | Admitting: Gastroenterology

## 2012-01-30 LAB — CBC
MCV: 90.3 fL (ref 78.0–100.0)
Platelets: 100 10*3/uL — ABNORMAL LOW (ref 150–400)
RBC: 3.4 MIL/uL — ABNORMAL LOW (ref 4.22–5.81)
WBC: 10.9 10*3/uL — ABNORMAL HIGH (ref 4.0–10.5)

## 2012-01-30 LAB — BASIC METABOLIC PANEL
CO2: 26 mEq/L (ref 19–32)
Chloride: 97 mEq/L (ref 96–112)
GFR calc Af Amer: 72 mL/min — ABNORMAL LOW (ref >90)
Potassium: 4.3 mEq/L (ref 3.5–5.1)
Sodium: 131 mEq/L — ABNORMAL LOW (ref 135–145)

## 2012-01-30 MED ORDER — IOHEXOL 300 MG/ML  SOLN
100.0000 mL | Freq: Once | INTRAMUSCULAR | Status: AC | PRN
  Administered 2012-01-30: 100 mL via INTRAVENOUS

## 2012-01-30 NOTE — Telephone Encounter (Signed)
Spoke with patient's wife Eunice Blase. She states patient was transferred to [facility] this weekend. She states that Dr. Zenda Alpers at this facility told her Mr. Glazebrook will not get any better without a liver transplant. She states Dr. Christella Hartigan has never told them this. She wants to know if this is the case.If this is the case, she wants to know if he is a candidate for a liver transplant. HX cirrhosis. Please, advise.

## 2012-01-31 LAB — BASIC METABOLIC PANEL
CO2: 25 mEq/L (ref 19–32)
Chloride: 97 mEq/L (ref 96–112)
Glucose, Bld: 169 mg/dL — ABNORMAL HIGH (ref 70–99)
Potassium: 4.1 mEq/L (ref 3.5–5.1)
Sodium: 131 mEq/L — ABNORMAL LOW (ref 135–145)

## 2012-02-01 ENCOUNTER — Telehealth: Payer: Self-pay | Admitting: Gastroenterology

## 2012-02-01 LAB — CBC
HCT: 31.8 % — ABNORMAL LOW (ref 39.0–52.0)
Hemoglobin: 10.8 g/dL — ABNORMAL LOW (ref 13.0–17.0)
MCH: 30.1 pg (ref 26.0–34.0)
MCHC: 34 g/dL (ref 30.0–36.0)
RBC: 3.59 MIL/uL — ABNORMAL LOW (ref 4.22–5.81)

## 2012-02-01 LAB — BASIC METABOLIC PANEL
BUN: 53 mg/dL — ABNORMAL HIGH (ref 6–23)
Chloride: 97 mEq/L (ref 96–112)
Glucose, Bld: 190 mg/dL — ABNORMAL HIGH (ref 70–99)
Potassium: 3.7 mEq/L (ref 3.5–5.1)
Sodium: 130 mEq/L — ABNORMAL LOW (ref 135–145)

## 2012-02-01 NOTE — Progress Notes (Signed)
  Subjective: Tearful today, had hard time sleeping last night due to pain. Denies N/V. Patient again requesting to see Dr. Christella Hartigan - see that request has been submitted for same on 3/1.  Objective: Vital signs in last 24 hours: Patient is afebrile and VSS.     Physical exam : tearful, appropriate mentation.  Both drains intact with clean insertion sites.  Perinephric drain with 150 ml output yesterday with appx. 50 ml in bag now with bloody serous drainage. Pelvic drain has brown feculent smelling output with appx. 75 ml in bag.  150 ml recorded output yesterday.  Soft abdomen, non-tender over epigastric area, positive bowel sounds.  E.coli has grown from all cultures - Urine and both drainage sites - now with feculent appearing output from drain concerning ???fistula???.  Will monitor.   Lab Results:   Basename 02/01/12 0510 01/30/12 0530  WBC 12.1* 10.9*  HGB 10.8* 10.5*  HCT 31.8* 30.7*  PLT 110* 100*   BMET  Basename 02/01/12 0510 01/31/12 0600  NA 130* 131*  K 3.7 4.1  CL 97 97  CO2 26 25  GLUCOSE 190* 169*  BUN 53* 53*  CREATININE 1.19 1.20  CALCIUM 8.4 8.3*    Studies/Results: CT performed revealing improved but not resolved collections and new finding of gastritis.    Anti-infectives:  Zosyn.   Assessment/Plan:  Spontaneous perinephric bleed / abscess s/p drainage.  Positive cultures for e.coli most sensitive to zosyn.  To continue drains - possible injection of one in future.  IR to continue to follow.   LOS: 5 days    Davison Ohms D 02/01/2012

## 2012-02-01 NOTE — Telephone Encounter (Signed)
See note from 01-30-12

## 2012-02-02 DIAGNOSIS — M7989 Other specified soft tissue disorders: Secondary | ICD-10-CM

## 2012-02-02 DIAGNOSIS — R188 Other ascites: Secondary | ICD-10-CM

## 2012-02-02 DIAGNOSIS — D689 Coagulation defect, unspecified: Secondary | ICD-10-CM

## 2012-02-02 DIAGNOSIS — K766 Portal hypertension: Secondary | ICD-10-CM

## 2012-02-02 DIAGNOSIS — K7689 Other specified diseases of liver: Secondary | ICD-10-CM

## 2012-02-02 DIAGNOSIS — K7460 Unspecified cirrhosis of liver: Secondary | ICD-10-CM

## 2012-02-02 LAB — BASIC METABOLIC PANEL
CO2: 26 mEq/L (ref 19–32)
GFR calc non Af Amer: 62 mL/min — ABNORMAL LOW (ref >90)
Glucose, Bld: 178 mg/dL — ABNORMAL HIGH (ref 70–99)
Potassium: 3.4 mEq/L — ABNORMAL LOW (ref 3.5–5.1)
Sodium: 132 mEq/L — ABNORMAL LOW (ref 135–145)

## 2012-02-02 LAB — CBC
Hemoglobin: 10.6 g/dL — ABNORMAL LOW (ref 13.0–17.0)
MCHC: 34.4 g/dL (ref 30.0–36.0)
RBC: 3.46 MIL/uL — ABNORMAL LOW (ref 4.22–5.81)

## 2012-02-02 NOTE — Telephone Encounter (Signed)
Pt wife is calling back to discuss her husbands condition.  She would like to speak directly to Dr Christella Hartigan.

## 2012-02-02 NOTE — Progress Notes (Signed)
VASCULAR LAB PRELIMINARY  PRELIMINARY  PRELIMINARY  PRELIMINARY  Bilateral lower extremity venous duplex  completed.    Preliminary report:  Right:  DVT of indeterminate age noted from CFV through the popliteal vein  Calf veins appear patent .  No evidence of superficial thrombosis.  No Baker's cyst.  Left: DVT of indeterminate age noted in the CFV.  Calf veins not adequately visualized.  No evidence of superficial thrombosis.  No Baker's cyst.   Terance Hart, RVT 02/02/2012, 7:32 PM

## 2012-02-02 NOTE — Telephone Encounter (Signed)
i spoke with his wife.  [name], dwindling.  He has cirrhosis and multivessel CAD, he has been doing increasingly poorly with encephalopathy, edema, electrolyte abnormalities, and most recently spontaneous retroperit hematoma.  I explained that I do not think he is a transplant candidate.

## 2012-02-02 NOTE — Consult Note (Signed)
FULL CONSULT NOTE IN PAPER CHART. PATIENT WITH ESTABLISHED CIRRHOSIS WITHOUT ACUTE DECOMPENSATION. NOTHING TO OFFER IN ADDITION TO CURRENT CARE PLAN. NOT A TRANSPLANT CANDIDATE PER HIS PRIMARY GI MD (DR JACOBS). PLEASE CALL FOR ACUTE PROBLEMS, QUESTIONS, OR GUIDANCE WITH MANAGEMENT OF HIS LIVER DISEASE. THANKS.  [name]. Eda Keys., M.D. [facility] Division of Gastroenterology

## 2012-02-03 ENCOUNTER — Encounter: Payer: Self-pay | Admitting: Radiology

## 2012-02-03 ENCOUNTER — Other Ambulatory Visit (HOSPITAL_COMMUNITY): Payer: Self-pay

## 2012-02-03 LAB — CBC
Hemoglobin: 11 g/dL — ABNORMAL LOW (ref 13.0–17.0)
MCH: 31 pg (ref 26.0–34.0)
RBC: 3.55 MIL/uL — ABNORMAL LOW (ref 4.22–5.81)

## 2012-02-03 LAB — BASIC METABOLIC PANEL
CO2: 25 mEq/L (ref 19–32)
Calcium: 7.9 mg/dL — ABNORMAL LOW (ref 8.4–10.5)
Chloride: 98 mEq/L (ref 96–112)
Glucose, Bld: 228 mg/dL — ABNORMAL HIGH (ref 70–99)
Sodium: 130 mEq/L — ABNORMAL LOW (ref 135–145)

## 2012-02-03 MED ORDER — MIDAZOLAM HCL 2 MG/2ML IJ SOLN
INTRAMUSCULAR | Status: AC
  Filled 2012-02-03: qty 4

## 2012-02-03 MED ORDER — SODIUM CHLORIDE 0.9 % IV SOLN
INTRAVENOUS | Status: AC | PRN
  Administered 2012-02-03: 50 mL/h via INTRAVENOUS

## 2012-02-03 MED ORDER — FENTANYL CITRATE 0.05 MG/ML IJ SOLN
INTRAMUSCULAR | Status: AC
  Filled 2012-02-03: qty 4

## 2012-02-03 MED ORDER — MIDAZOLAM HCL 5 MG/5ML IJ SOLN
INTRAMUSCULAR | Status: AC | PRN
  Administered 2012-02-03 (×2): 0.5 mg via INTRAVENOUS

## 2012-02-03 MED ORDER — FENTANYL CITRATE 0.05 MG/ML IJ SOLN
INTRAMUSCULAR | Status: AC | PRN
  Administered 2012-02-03: 25 ug via INTRAVENOUS

## 2012-02-03 NOTE — ED Notes (Signed)
Patient transferred to room with transport and self. Report given to nurse Abigail and dressing to right neck checked with her and is clean dry and intact.

## 2012-02-03 NOTE — ED Notes (Signed)
Patient wants sedation prior to being moved due to abd. Pain which is not new, 8/10.

## 2012-02-03 NOTE — H&P (Signed)
[name] is an 61 y.o. male.   Chief Complaint: Bilateral low extremity DVT Cirrhosis; fall risk; retroperitoneal hematoma Cannot anticoagulate  HPI: scheduled for Inferior vena cava filter placement today in IR  Past Medical History  Diagnosis Date  . Hypertension   . Type II or unspecified type diabetes mellitus without mention of complication, not stated as uncontrolled   . Thyroid disease   . Cirrhosis, non-alcoholic   . Morbid obesity   . Pancytopenia   . Fatty liver   . Coronary artery disease   . Myocardial infarction     "HEART INCIDENT " OCT 2012  . Angina     RARE USE OF NTG  . CHF (congestive heart failure)     JUNE 2012  . Arthritis   . Gout 35 YRS AGO  . Cellulitis of left leg   . Cirrhosis of liver     NONALCOHOLIC  . Nocturia   . Elevated BUN   . Elevated serum creatinine   . Serum ammonia increased   . Splenomegaly   . Platelets decreased   . Thyroid disorder     PT DOES NOT KNOW IF LOW OR HIGH  . Chronic kidney disease (CKD), stage III (moderate)   . Obstructive sleep apnea   . Hyponatremia   . Hyperkalemia   . Heart murmur   . Back pain     has compression fx    Past Surgical History  Procedure Date  . Coronary stent placement     2 CARDIAC STENTS OCT 08/2011  . Total hip arthroplasty 10/14/2011    Procedure: TOTAL HIP ARTHROPLASTY ANTERIOR APPROACH;  Surgeon: Kathryne Hitch;  Location: [facility];  Service: Orthopedics;  Laterality: Right;  . Joint replacement   . Appendectomy 1968  . Tonsillectomy 1957  . Cardiac catheterization 09/08/2011    Family History  Problem Relation Age of Onset  . Colon cancer Neg Hx   . Diabetes Mother   . Diabetes Father    Social History:  reports that he has never smoked. He has never used smokeless tobacco. He reports that he does not drink alcohol or use illicit drugs.  Allergies:  Allergies  Allergen Reactions  . Crestor (Rosuvastatin Calcium)     unknown  . Statins Other (See Comments)     PT DOES NOT REMEMBER    Medications Prior to Admission  Medication Dose Route Frequency Provider Last Rate Last Dose  . heparin lock flush 100 unit/mL  250 Units Intracatheter Prior to discharge Michelle A. Ashley Royalty, MD   500 Units at 01/27/12 1505  . iohexol (OMNIPAQUE) 300 MG/ML solution 100 mL  100 mL Intravenous Once PRN Medication Radiologist, MD   100 mL at 01/30/12 1124  . DISCONTD: 0.9 %  sodium chloride infusion  250 mL Intravenous PRN Houston Siren, MD      . DISCONTD: calcitonin (salmon) (MIACALCIN/FORTICAL) nasal spray 1 spray  1 spray Alternating Nares q morning - 10a Houston Siren, MD   1 spray at 01/27/12 1019  . DISCONTD: cefTRIAXone (ROCEPHIN) 1 g in dextrose 5 % 50 mL IVPB  1 g Intravenous Q24H Houston Siren, MD   1 g at 01/26/12 0539  . DISCONTD: docusate sodium (COLACE) capsule 100 mg  100 mg Oral BID Houston Siren, MD   100 mg at 01/27/12 1017  . DISCONTD: furosemide (LASIX) tablet 60 mg  60 mg Oral BID Iva Boop, MD   60 mg at 01/27/12 1750  . DISCONTD: HYDROmorphone (  DILAUDID) injection 0.5 mg  0.5 mg Intravenous Q4H PRN Houston Siren, MD   0.5 mg at 01/27/12 1059  . DISCONTD: influenza  inactive virus vaccine (FLUZONE/FLUARIX) injection 0.5 mL  0.5 mL Intramuscular Once Michelle A. Ashley Royalty, MD      . DISCONTD: insulin aspart (novoLOG) injection 0-20 Units  0-20 Units Subcutaneous TID WC Rolan Lipa, NP   7 Units at 01/27/12 1749  . DISCONTD: insulin aspart (novoLOG) injection 0-5 Units  0-5 Units Subcutaneous QHS Rolan Lipa, NP   2 Units at 01/26/12 2222  . DISCONTD: insulin glargine (LANTUS) injection 10 Units  10 Units Subcutaneous QHS Gwenyth Bender, NP   10 Units at 01/25/12 2218  . DISCONTD: insulin glargine (LANTUS) injection 20 Units  20 Units Subcutaneous QHS Gwenyth Bender, NP   20 Units at 01/26/12 2224  . DISCONTD: lactulose (CHRONULAC) 10 GM/15ML solution 30 g  30 g Oral TID Houston Siren, MD   30 g at 01/27/12 1018  . DISCONTD: levothyroxine (SYNTHROID,  LEVOTHROID) tablet 50 mcg  50 mcg Oral Q0600 Houston Siren, MD   50 mcg at 01/27/12 0524  . DISCONTD: magic mouthwash  5 mL Oral QID PRN Houston Siren, MD   5 mL at 01/21/12 2234  . DISCONTD: metoprolol succinate (TOPROL-XL) 24 hr tablet 12.5 mg  12.5 mg Oral Daily Houston Siren, MD   12.5 mg at 01/27/12 1017  . DISCONTD: mulitivitamin with minerals tablet 1 tablet  1 tablet Oral Daily Houston Siren, MD   1 tablet at 01/27/12 1017  . DISCONTD: nystatin (NYSTOP) topical powder 100,000 Units  1 g Topical Daily Houston Siren, MD   100,000 Units at 01/27/12 1019  . DISCONTD: pantoprazole (PROTONIX) EC tablet 40 mg  40 mg Oral Q1200 Houston Siren, MD   40 mg at 01/27/12 1018  . DISCONTD: piperacillin-tazobactam (ZOSYN) IVPB 3.375 g  3.375 g Intravenous Q8H Amanda Runyon, PHARMD   3.375 g at 01/27/12 1218  . DISCONTD: potassium chloride SA (K-DUR,KLOR-CON) CR tablet 20 mEq  20 mEq Oral BID [name], PA   20 mEq at 01/27/12 1017  . DISCONTD: rifaximin (XIFAXAN) tablet 550 mg  550 mg Oral BID Houston Siren, MD   550 mg at 01/27/12 1017  . DISCONTD: rOPINIRole (REQUIP) tablet 1 mg  1 mg Oral QHS Houston Siren, MD   1 mg at 01/26/12 2226  . DISCONTD: sodium chloride 0.9 % injection 10-40 mL  10-40 mL Intracatheter Q12H Michelle A. Ashley Royalty, MD   10 mL at 01/27/12 0423  . DISCONTD: sodium chloride 0.9 % injection 10-40 mL  10-40 mL Intracatheter PRN Michelle A. Ashley Royalty, MD   10 mL at 01/27/12 1505  . DISCONTD: sodium chloride 0.9 % injection 3 mL  3 mL Intravenous Q12H Houston Siren, MD   3 mL at 01/26/12 2222  . DISCONTD: sodium chloride 0.9 % injection 3 mL  3 mL Intravenous Q12H Houston Siren, MD   3 mL at 01/26/12 2226  . DISCONTD: sodium chloride 0.9 % injection 3 mL  3 mL Intravenous PRN Houston Siren, MD   3 mL at 01/24/12 1158   Medications Prior to Admission  Medication Sig Dispense Refill  . Alum & Mag Hydroxide-Simeth (MAGIC MOUTHWASH) SOLN Swish and spit 5 mLs 4 (four) times daily as needed.      . calcitonin, salmon, (MIACALCIN/FORTICAL) 200  UNIT/ACT nasal spray Place 1 spray into the nose See admin instructions. 200 units spone spray Alternate nostrils every day      .  docusate sodium (COLACE) 100 MG capsule Take 100 mg by mouth 2 (two) times daily.        . furosemide (LASIX) 20 MG tablet Take 3 tablets (60 mg total) by mouth 2 (two) times daily.  30 tablet  0  . HYDROmorphone (DILAUDID) 1 MG/ML SOLN injection Inject 0.5 mLs (0.5 mg total) into the vein every 4 (four) hours as needed for pain.  1 mL  0  . insulin glargine (LANTUS) 100 UNIT/ML injection Inject 20 Units into the skin at bedtime.  10 mL  0  . lactulose (CHRONULAC) 10 GM/15ML solution Take 45 mLs (30 g total) by mouth 3 (three) times daily.  240 mL  0  . levothyroxine (SYNTHROID, LEVOTHROID) 50 MCG tablet Take 50 mcg by mouth every morning.       . metoprolol succinate (TOPROL-XL) 25 MG 24 hr tablet 12.5 mg daily.       . Multiple Vitamin (MULITIVITAMIN WITH MINERALS) TABS Take 1 tablet by mouth daily.      Marland Kitchen nystatin (NYSTOP) 100000 UNIT/GM POWD Apply 1 g topically daily. topically      . pantoprazole (PROTONIX) 40 MG tablet Take 40 mg by mouth daily at 12 noon.        . piperacillin-tazobactam (ZOSYN) 3-0.375 GM/50ML IVPB Inject 50 mLs (3.375 g total) into the vein every 8 (eight) hours.  50 mL  0  . potassium chloride SA (K-DUR,KLOR-CON) 20 MEQ tablet Take 1 tablet (20 mEq total) by mouth 2 (two) times daily.  1 tablet  0  . rifaximin (XIFAXAN) 550 MG TABS Take 550 mg by mouth 2 (two) times daily.      Marland Kitchen rOPINIRole (REQUIP) 1 MG tablet Take 1 mg by mouth at bedtime.        Results for orders placed during the hospital encounter of 01/27/12 (from the past 48 hour(s))  CBC     Status: Abnormal   Collection Time   02/02/12  5:00 AM      Component Value Range Comment   WBC 10.8 (*) 4.0 - 10.5 (K/uL)    RBC 3.46 (*) 4.22 - 5.81 (MIL/uL)    Hemoglobin 10.6 (*) 13.0 - 17.0 (g/dL)    HCT 16.1 (*) 09.6 - 52.0 (%)    MCV 89.0  78.0 - 100.0 (fL)    MCH 30.6  26.0 - 34.0  (pg)    MCHC 34.4  30.0 - 36.0 (g/dL)    RDW 04.5 (*) 40.9 - 15.5 (%)    Platelets 106 (*) 150 - 400 (K/uL) CONSISTENT WITH PREVIOUS RESULT  BASIC METABOLIC PANEL     Status: Abnormal   Collection Time   02/02/12  6:50 AM      Component Value Range Comment   Sodium 132 (*) 135 - 145 (mEq/L)    Potassium 3.4 (*) 3.5 - 5.1 (mEq/L)    Chloride 98  96 - 112 (mEq/L)    CO2 26  19 - 32 (mEq/L)    Glucose, Bld 178 (*) 70 - 99 (mg/dL)    BUN 54 (*) 6 - 23 (mg/dL)    Creatinine, Ser 8.11  0.50 - 1.35 (mg/dL)    Calcium 7.9 (*) 8.4 - 10.5 (mg/dL)    GFR calc non Af Amer 62 (*) >90 (mL/min)    GFR calc Af Amer 72 (*) >90 (mL/min)   CBC     Status: Abnormal   Collection Time   02/03/12  5:30 AM  Component Value Range Comment   WBC 11.5 (*) 4.0 - 10.5 (K/uL)    RBC 3.55 (*) 4.22 - 5.81 (MIL/uL)    Hemoglobin 11.0 (*) 13.0 - 17.0 (g/dL)    HCT 16.1 (*) 09.6 - 52.0 (%)    MCV 89.0  78.0 - 100.0 (fL)    MCH 31.0  26.0 - 34.0 (pg)    MCHC 34.8  30.0 - 36.0 (g/dL)    RDW 04.5 (*) 40.9 - 15.5 (%)    Platelets 111 (*) 150 - 400 (K/uL) CONSISTENT WITH PREVIOUS RESULT  BASIC METABOLIC PANEL     Status: Abnormal   Collection Time   02/03/12  5:30 AM      Component Value Range Comment   Sodium 130 (*) 135 - 145 (mEq/L)    Potassium 4.3  3.5 - 5.1 (mEq/L)    Chloride 98  96 - 112 (mEq/L)    CO2 25  19 - 32 (mEq/L)    Glucose, Bld 228 (*) 70 - 99 (mg/dL)    BUN 57 (*) 6 - 23 (mg/dL)    Creatinine, Ser 8.11 (*) 0.50 - 1.35 (mg/dL)    Calcium 7.9 (*) 8.4 - 10.5 (mg/dL)    GFR calc non Af Amer 54 (*) >90 (mL/min)    GFR calc Af Amer 63 (*) >90 (mL/min)    No results found.  Review of Systems  Constitutional: Negative for fever.  Cardiovascular: Negative for chest pain.  Gastrointestinal: Negative for nausea and vomiting.    There were no vitals taken for this visit. Physical Exam  Cardiovascular: Normal rate.   No murmur heard. Respiratory: Effort normal and breath sounds normal. He has no  wheezes.     Assessment/Plan B Low Extr DVT; cannot anticoagulate Fall risk; retroperitoneal hematoma Scheduled for IVC filter placement pts wife aware of procedure benefits and risks and agreeable to proceed. Consent signed and in chart.   Graceland Wachter A 02/03/2012, 8:04 AM

## 2012-02-04 LAB — COMPREHENSIVE METABOLIC PANEL
Alkaline Phosphatase: 188 U/L — ABNORMAL HIGH (ref 39–117)
BUN: 59 mg/dL — ABNORMAL HIGH (ref 6–23)
CO2: 25 mEq/L (ref 19–32)
Chloride: 96 mEq/L (ref 96–112)
GFR calc Af Amer: 58 mL/min — ABNORMAL LOW (ref >90)
GFR calc non Af Amer: 50 mL/min — ABNORMAL LOW (ref >90)
Glucose, Bld: 182 mg/dL — ABNORMAL HIGH (ref 70–99)
Potassium: 4.3 mEq/L (ref 3.5–5.1)
Total Bilirubin: 1.9 mg/dL — ABNORMAL HIGH (ref 0.3–1.2)
Total Protein: 5 g/dL — ABNORMAL LOW (ref 6.0–8.3)

## 2012-02-05 LAB — BASIC METABOLIC PANEL
Calcium: 7.6 mg/dL — ABNORMAL LOW (ref 8.4–10.5)
Creatinine, Ser: 1.57 mg/dL — ABNORMAL HIGH (ref 0.50–1.35)
GFR calc Af Amer: 53 mL/min — ABNORMAL LOW (ref >90)
GFR calc non Af Amer: 46 mL/min — ABNORMAL LOW (ref >90)
Sodium: 127 mEq/L — ABNORMAL LOW (ref 135–145)

## 2012-02-05 LAB — CBC
Platelets: 88 10*3/uL — ABNORMAL LOW (ref 150–400)
RBC: 3.45 MIL/uL — ABNORMAL LOW (ref 4.22–5.81)
RDW: 18.4 % — ABNORMAL HIGH (ref 11.5–15.5)
WBC: 11.5 10*3/uL — ABNORMAL HIGH (ref 4.0–10.5)

## 2012-02-06 LAB — BASIC METABOLIC PANEL
CO2: 23 mEq/L (ref 19–32)
Chloride: 99 mEq/L (ref 96–112)
GFR calc Af Amer: 54 mL/min — ABNORMAL LOW (ref >90)
Sodium: 129 mEq/L — ABNORMAL LOW (ref 135–145)

## 2012-02-07 LAB — BASIC METABOLIC PANEL
CO2: 23 mEq/L (ref 19–32)
Chloride: 102 mEq/L (ref 96–112)
Creatinine, Ser: 1.79 mg/dL — ABNORMAL HIGH (ref 0.50–1.35)
GFR calc Af Amer: 45 mL/min — ABNORMAL LOW (ref >90)
Potassium: 3.7 mEq/L (ref 3.5–5.1)

## 2012-02-07 LAB — RENAL FUNCTION PANEL
CO2: 23 mEq/L (ref 19–32)
Calcium: 7.6 mg/dL — ABNORMAL LOW (ref 8.4–10.5)
GFR calc Af Amer: 51 mL/min — ABNORMAL LOW (ref >90)
GFR calc non Af Amer: 44 mL/min — ABNORMAL LOW (ref >90)
Glucose, Bld: 141 mg/dL — ABNORMAL HIGH (ref 70–99)
Phosphorus: 3.6 mg/dL (ref 2.3–4.6)
Potassium: 3.8 mEq/L (ref 3.5–5.1)
Sodium: 129 mEq/L — ABNORMAL LOW (ref 135–145)

## 2012-02-07 NOTE — Progress Notes (Signed)
  Subjective: Patient doing fair; no acute changes  Objective: VSS;AF     Left perinephric/RP drains intact; increased output of yellow fluid noted from RP drain ?ascites vs urinoma; reddish-brown fluid in perinephric drain, cx's e coli    Results for orders placed during the hospital encounter of 01/20/12  URINE CULTURE     Status: Normal   Collection Time   01/20/12  8:16 PM      Component Value Range Status Comment   Specimen Description URINE, CATHETERIZED   Final    Special Requests NONE   Final    Culture  Setup Time [id number]   Final    Colony Count >=100,000 COLONIES/ML   Final    Culture ESCHERICHIA COLI   Final    Report Status 01/24/2012 FINAL   Final    Organism ID, Bacteria ESCHERICHIA COLI   Final   BODY FLUID CULTURE     Status: Normal   Collection Time   01/26/12  4:36 PM      Component Value Range Status Comment   Specimen Description ABSCESS   Final    Special Requests Immunocompromised   Final    Gram Stain     Final    Value: RARE WBC PRESENT,BOTH PMN AND MONONUCLEAR     RARE GRAM NEGATIVE RODS   Culture     Final    Value: FEW ESCHERICHIA COLI     Note: SUSCEPTIBILITIES PERFORMED ON PREVIOUS CULTURE WITHIN THE LAST 5 DAYS.   Report Status 01/29/2012 FINAL   Final   BODY FLUID CULTURE     Status: Normal   Collection Time   01/26/12  4:37 PM      Component Value Range Status Comment   Specimen Description ABSCESS   Final    Special Requests Immunocompromised   Final    Gram Stain     Final    Value: RARE WBC PRESENT,BOTH PMN AND MONONUCLEAR     NO ORGANISMS SEEN   Culture MODERATE ESCHERICHIA COLI   Final    Report Status 01/29/2012 FINAL   Final    Organism ID, Bacteria ESCHERICHIA COLI   Final          Lab Results:   Basename 02/05/12 0351  WBC 11.5*  HGB 10.5*  HCT 31.0*  PLT 88*   BMET  Basename 02/07/12 0403 02/06/12 0608  NA 129* 129*  K 3.8 3.7  CL 98 99  CO2 23 23  GLUCOSE 141* 179*  BUN 59* 59*  CREATININE 1.63* 1.55*   CALCIUM 7.6* 7.8*   PT/INR No results found for this basename: LABPROT:2,INR:2 in the last 72 hours ABG No results found for this basename: PHART:2,PCO2:2,PO2:2,HCO3:2 in the last 72 hours  Studies/Results: No results found.  Anti-infectives: Anti-infectives    None      Assessment/Plan: s/p left perinephric/RP fluid coll/abscess drainages 2/27; check fluid creat level, repeat cx's to check sensitivity; last CT 3/4- would recheck f/u CT with possible drain injection in next few days; if urinoma, consult urology   LOS: 11 days    Azaela Caracci,D [facility] 02/07/2012

## 2012-02-08 LAB — URINALYSIS, ROUTINE W REFLEX MICROSCOPIC
Bilirubin Urine: NEGATIVE
Glucose, UA: NEGATIVE mg/dL
Ketones, ur: NEGATIVE mg/dL
Nitrite: NEGATIVE
Specific Gravity, Urine: 1.021 (ref 1.005–1.030)
pH: 5.5 (ref 5.0–8.0)

## 2012-02-08 LAB — BASIC METABOLIC PANEL
CO2: 22 mEq/L (ref 19–32)
Calcium: 7.5 mg/dL — ABNORMAL LOW (ref 8.4–10.5)
Calcium: 7.4 mg/dL — ABNORMAL LOW (ref 8.4–10.5)
Creatinine, Ser: 1.72 mg/dL — ABNORMAL HIGH (ref 0.50–1.35)
GFR calc Af Amer: 51 mL/min — ABNORMAL LOW (ref >90)
GFR calc non Af Amer: 41 mL/min — ABNORMAL LOW (ref >90)
GFR calc non Af Amer: 44 mL/min — ABNORMAL LOW (ref >90)
Glucose, Bld: 206 mg/dL — ABNORMAL HIGH (ref 70–99)
Potassium: 3.6 mEq/L (ref 3.5–5.1)
Sodium: 130 mEq/L — ABNORMAL LOW (ref 135–145)
Sodium: 126 mEq/L — ABNORMAL LOW (ref 135–145)

## 2012-02-08 LAB — URINE MICROSCOPIC-ADD ON

## 2012-02-09 LAB — BASIC METABOLIC PANEL
CO2: 22 mEq/L (ref 19–32)
Calcium: 7.3 mg/dL — ABNORMAL LOW (ref 8.4–10.5)
Chloride: 99 mEq/L (ref 96–112)
Creatinine, Ser: 1.73 mg/dL — ABNORMAL HIGH (ref 0.50–1.35)
GFR calc Af Amer: 54 mL/min — ABNORMAL LOW (ref >90)
GFR calc Af Amer: 47 mL/min — ABNORMAL LOW (ref >90)
GFR calc non Af Amer: 41 mL/min — ABNORMAL LOW (ref >90)
Potassium: 3.7 mEq/L (ref 3.5–5.1)
Sodium: 127 mEq/L — ABNORMAL LOW (ref 135–145)

## 2012-02-09 LAB — URINE CULTURE: Culture  Setup Time: 201303140222

## 2012-02-09 LAB — CREATININE, FLUID (PLEURAL, PERITONEAL, JP DRAINAGE): Creat, Fluid: 1.7 mg/dL

## 2012-02-10 ENCOUNTER — Other Ambulatory Visit (HOSPITAL_COMMUNITY): Payer: Self-pay

## 2012-02-10 ENCOUNTER — Encounter: Payer: Self-pay | Admitting: Radiology

## 2012-02-10 ENCOUNTER — Telehealth: Payer: Self-pay | Admitting: Gastroenterology

## 2012-02-10 LAB — DIFFERENTIAL
Basophils Relative: 1 % (ref 0–1)
Eosinophils Absolute: 0.4 10*3/uL (ref 0.0–0.7)
Eosinophils Relative: 4 % (ref 0–5)
Lymphs Abs: 1 10*3/uL (ref 0.7–4.0)

## 2012-02-10 LAB — CBC
MCH: 31.2 pg (ref 26.0–34.0)
MCHC: 34.8 g/dL (ref 30.0–36.0)
MCV: 89.9 fL (ref 78.0–100.0)
Platelets: 76 10*3/uL — ABNORMAL LOW (ref 150–400)
RDW: 18.1 % — ABNORMAL HIGH (ref 11.5–15.5)

## 2012-02-10 LAB — BASIC METABOLIC PANEL
Calcium: 7.7 mg/dL — ABNORMAL LOW (ref 8.4–10.5)
GFR calc Af Amer: 49 mL/min — ABNORMAL LOW (ref >90)
GFR calc non Af Amer: 42 mL/min — ABNORMAL LOW (ref >90)
Glucose, Bld: 143 mg/dL — ABNORMAL HIGH (ref 70–99)
Sodium: 129 mEq/L — ABNORMAL LOW (ref 135–145)

## 2012-02-10 LAB — WOUND CULTURE

## 2012-02-10 MED ORDER — IOHEXOL 300 MG/ML  SOLN
100.0000 mL | Freq: Once | INTRAMUSCULAR | Status: AC | PRN
  Administered 2012-02-10: 100 mL via INTRAVENOUS

## 2012-02-10 MED ORDER — IOHEXOL 300 MG/ML  SOLN
20.0000 mL | INTRAMUSCULAR | Status: AC
  Administered 2012-02-10: 20 mL via ORAL

## 2012-02-10 NOTE — Telephone Encounter (Signed)
Dr Claudie Fisherman took pt off lasix and added samsca, a new drug and the pt's wife would like for Dr Christella Hartigan to be aware and review his labs they are doing.  She is having those faxed to our office.

## 2012-02-10 NOTE — Progress Notes (Signed)
  Subjective: No new complaints, emotionally back and forth   Objective: Vital signs in last 24 hours: stable   PE:  Drains remain intact with output up.  Cultures again revealed e.coli and 1.7 creatinine from other drain.    Discussion with PA - recommend CT today - need to get a better picture of what is going on here.  Possible urinoma - may need urology assistance.    Lab Rusults:   Basename 02/10/12 1011  WBC 10.3  HGB 11.4*  HCT 32.8*  PLT 76*   BMET  Basename 02/10/12 1011 02/09/12 1710  NA 129* 127*  K 4.5 3.8  CL 100 100  CO2 20 20  GLUCOSE 143* 212*  BUN 63* 62*  CREATININE 1.68* 1.73*  CALCIUM 7.7* 7.3*   Anti-infectives: Anti-infectives    None      Assessment/Plan:  S/p perinephric and retroperitoneal drains - e.coli on cultures and creatinine 1.7 on fluid analysis. For CT today with oral and IV contrast.   LOS: 14 days    Hillel Card D 02/10/2012

## 2012-02-11 LAB — WOUND CULTURE

## 2012-02-11 LAB — BASIC METABOLIC PANEL
BUN: 63 mg/dL — ABNORMAL HIGH (ref 6–23)
BUN: 58 mg/dL — ABNORMAL HIGH (ref 6–23)
CO2: 22 mEq/L (ref 19–32)
Calcium: 7.7 mg/dL — ABNORMAL LOW (ref 8.4–10.5)
Calcium: 7.7 mg/dL — ABNORMAL LOW (ref 8.4–10.5)
Calcium: 6.7 mg/dL — ABNORMAL LOW (ref 8.4–10.5)
Creatinine, Ser: 1.64 mg/dL — ABNORMAL HIGH (ref 0.50–1.35)
GFR calc Af Amer: 51 mL/min — ABNORMAL LOW (ref >90)
GFR calc non Af Amer: 44 mL/min — ABNORMAL LOW (ref >90)
GFR calc non Af Amer: 44 mL/min — ABNORMAL LOW (ref >90)
GFR calc non Af Amer: 51 mL/min — ABNORMAL LOW (ref >90)
Glucose, Bld: 135 mg/dL — ABNORMAL HIGH (ref 70–99)
Glucose, Bld: 559 mg/dL (ref 70–99)
Sodium: 129 mEq/L — ABNORMAL LOW (ref 135–145)
Sodium: 128 mEq/L — ABNORMAL LOW (ref 135–145)

## 2012-02-11 LAB — HEPATIC FUNCTION PANEL
ALT: 23 U/L (ref 0–53)
Alkaline Phosphatase: 184 U/L — ABNORMAL HIGH (ref 39–117)
Indirect Bilirubin: 0.9 mg/dL (ref 0.3–0.9)
Total Bilirubin: 1.4 mg/dL — ABNORMAL HIGH (ref 0.3–1.2)
Total Protein: 5 g/dL — ABNORMAL LOW (ref 6.0–8.3)

## 2012-02-11 NOTE — Progress Notes (Signed)
Subjective: Somewhat confused today.  No new complaints. Friend from out of town visiting.   Objective: Vital signs in last 24 hours: 97.3, 115/80, HR: 88     Physical exam : patient in bed - saturated going, bedding and skin around drain insertion sites.  Bags are pulling on the insertion site stretching tract causing leakage. RN notified to keep on the bed or attached to rail to prevent further stretching of tract - may have to upsize these on Monday if continues.  Drain #1 output recorded as output yesterday, bloody output in bag currently, stitch intact, drain #2 (perinephric) output yesterday totaled >3L.  Clear urine in bag, suture intact. Abdomen soft, obese, non-tender.  I  Lab Results:   Basename 02/10/12 1011  WBC 10.3  HGB 11.4*  HCT 32.8*  PLT 76*   BMET  Basename 02/11/12 1018 02/11/12 0420  NA 130* 129*  K 4.6 4.3  CL 101 97  CO2 22 23  GLUCOSE 137* 135*  BUN 62* 63*  CREATININE 1.64* 1.64*  CALCIUM 7.7* 7.7*    Studies/Results: Ct Abdomen Pelvis W Contrast  02/10/2012  *RADIOLOGY REPORT*  Clinical Data: Abdominal pain.  CT ABDOMEN AND PELVIS WITH CONTRAST  Technique:  Multidetector CT imaging of the abdomen and pelvis was performed following the standard protocol during bolus administration of intravenous contrast.  Contrast: OMNIPAQUE IOHEXOL 300 MG/ML IJ SOLN  Comparison: CT of the abdomen and pelvis 01/23/2012.  Findings:  Lung Bases: Small left pleural effusion.  Passive atelectasis in the left lower lobe.  Mild cardiomegaly.  Extensive calcification of the aortic valve. There is atherosclerosis of the thoracic aorta, the great vessels of the mediastinum and the coronary arteries, including calcified atherosclerotic plaque in the left main, left anterior descending, left circumflex and right coronary arteries. A central venous catheter tip terminates in the right atrium.  Abdomen/Pelvis:  The liver again has a shrunken and nodular contour, suggestive  of underlying cirrhosis.  The gallbladder does not appear distended.  Gallbladder wall does demonstrate some avid enhancement (similar to the prior study).  No definite gallstones are identified.  The appearance of the pancreas, spleen and bilateral adrenal glands is unremarkable.  There are multiple small nonobstructive calculi within the collecting systems of the kidneys bilaterally, measuring up to 4 mm in size.  No definite ureteral calculi or signs of urinary tract obstruction at this time. Previously noted subcapsular fluid collection associated with the left kidney has nearly completely resolved, with a new pigtail drainage catheter in place.  There is a small - moderate volume of ascites, predominately in the perihepatic, perisplenic and pelvic regions.  No definite focal loculated collection noted to suggest intra-abdominal abscess. There is no pneumoperitoneum.  No pathologic distension of bowel. Urinary bladder is completely decompressed with Foley balloon catheter in place.  Some of the ascites is tracking into the right inguinal canal into a small right inguinal hernia. A circumaortic left renal vein is incidentally noted.  IVC filter is in place below the renal vein entrance into the IVC.  Musculoskeletal: There are no aggressive appearing lytic or blastic lesions noted in the visualized portions of the skeleton.  Status post right total hip arthroplasty.  The compression fractures at T12, L2, L4 and L5-4 are unchanged compared to the recent prior study, most severe at L5 where there is approximately 40% loss of vertebral body height.  IMPRESSION:  1.  Status post placement of pigtail drainage catheter in a subcapsular fluid collection  associated with the left kidney which is nearly completely resolved at this time. 2.  Small - moderate volume of ascites is simple in appearance, and has slightly decreased compared to the prior examination. 3.  Multiple nonobstructive calculi within the collecting  systems of the kidneys bilaterally, unchanged. 4.  Avid enhancement of the gallbladder wall, without evidence of cholelithiasis.  The gallbladder does not appear distended. Accurate assessment for other signs of cholecystitis is limited in the setting of the ascites, however, underlying cholecystitis is not strongly favored. 5. Atherosclerosis, including the left main and three-vessel coronary artery disease. Please note that although the presence of coronary artery calcium documents the presence of coronary artery disease, the severity of this disease and any potential stenosis cannot be assessed on this non-gated CT examination.  Assessment for potential risk factor modification, dietary therapy or pharmacologic therapy may be warranted, if clinically indicated. 6. There are calcifications of the aortic valve.  Echocardiographic correlation for evaluation of potential valvular dysfunction may be warranted if clinically indicated. 7.  Additional support apparatus, as above.  Original Report Authenticated By: Florencia Reasons, M.[name].    Assessment/Plan:  Perinephric and retroperitoneal collections s/p drainage 3/8.  E.coli from both and creatinine on perinephric drain worrisome for fistula/urinoma.  Spoke with wife via phone - drains to need to remain currently.  May have to upsize Monday 3/18 if continues to leak from stretched track.   LOS: 15 days    [name] 02/11/2012

## 2012-02-12 LAB — BASIC METABOLIC PANEL
BUN: 65 mg/dL — ABNORMAL HIGH (ref 6–23)
BUN: 62 mg/dL — ABNORMAL HIGH (ref 6–23)
Calcium: 7.7 mg/dL — ABNORMAL LOW (ref 8.4–10.5)
Calcium: 6.6 mg/dL — ABNORMAL LOW (ref 8.4–10.5)
Creatinine, Ser: 1.57 mg/dL — ABNORMAL HIGH (ref 0.50–1.35)
Creatinine, Ser: 1.53 mg/dL — ABNORMAL HIGH (ref 0.50–1.35)
GFR calc Af Amer: 55 mL/min — ABNORMAL LOW (ref >90)
GFR calc non Af Amer: 46 mL/min — ABNORMAL LOW (ref >90)
GFR calc non Af Amer: 47 mL/min — ABNORMAL LOW (ref >90)
Glucose, Bld: 100 mg/dL — ABNORMAL HIGH (ref 70–99)

## 2012-02-13 LAB — BASIC METABOLIC PANEL
BUN: 71 mg/dL — ABNORMAL HIGH (ref 6–23)
CO2: 33 mEq/L — ABNORMAL HIGH (ref 19–32)
Calcium: 6.6 mg/dL — ABNORMAL LOW (ref 8.4–10.5)
Chloride: 94 mEq/L — ABNORMAL LOW (ref 96–112)
Creatinine, Ser: 1.64 mg/dL — ABNORMAL HIGH (ref 0.50–1.35)
Creatinine, Ser: 1.43 mg/dL — ABNORMAL HIGH (ref 0.50–1.35)
GFR calc Af Amer: 51 mL/min — ABNORMAL LOW (ref >90)
GFR calc non Af Amer: 44 mL/min — ABNORMAL LOW (ref >90)

## 2012-02-14 LAB — BASIC METABOLIC PANEL
BUN: 74 mg/dL — ABNORMAL HIGH (ref 6–23)
Calcium: 7.7 mg/dL — ABNORMAL LOW (ref 8.4–10.5)
Calcium: 8 mg/dL — ABNORMAL LOW (ref 8.4–10.5)
Creatinine, Ser: 1.62 mg/dL — ABNORMAL HIGH (ref 0.50–1.35)
GFR calc Af Amer: 51 mL/min — ABNORMAL LOW (ref >90)
GFR calc non Af Amer: 46 mL/min — ABNORMAL LOW (ref >90)
GFR calc non Af Amer: 44 mL/min — ABNORMAL LOW (ref >90)
Glucose, Bld: 163 mg/dL — ABNORMAL HIGH (ref 70–99)
Potassium: 4.6 mEq/L (ref 3.5–5.1)
Sodium: 127 mEq/L — ABNORMAL LOW (ref 135–145)

## 2012-02-15 ENCOUNTER — Other Ambulatory Visit (HOSPITAL_COMMUNITY): Payer: Self-pay

## 2012-02-15 LAB — CBC
Platelets: 91 10*3/uL — ABNORMAL LOW (ref 150–400)
RDW: 17 % — ABNORMAL HIGH (ref 11.5–15.5)
WBC: 9.5 10*3/uL (ref 4.0–10.5)

## 2012-02-15 LAB — BASIC METABOLIC PANEL
Calcium: 7 mg/dL — ABNORMAL LOW (ref 8.4–10.5)
Calcium: 7.8 mg/dL — ABNORMAL LOW (ref 8.4–10.5)
Chloride: 83 mEq/L — ABNORMAL LOW (ref 96–112)
Creatinine, Ser: 1.5 mg/dL — ABNORMAL HIGH (ref 0.50–1.35)
GFR calc Af Amer: 56 mL/min — ABNORMAL LOW (ref >90)
GFR calc Af Amer: 49 mL/min — ABNORMAL LOW (ref >90)
GFR calc non Af Amer: 49 mL/min — ABNORMAL LOW (ref >90)
GFR calc non Af Amer: 43 mL/min — ABNORMAL LOW (ref >90)
Potassium: 4 mEq/L (ref 3.5–5.1)
Sodium: 127 mEq/L — ABNORMAL LOW (ref 135–145)

## 2012-02-15 LAB — CREATININE, URINE, RANDOM: Creatinine, Urine: 68.78 mg/dL

## 2012-02-16 LAB — CREATININE CLEARANCE, URINE, 24 HOUR
Creatinine Clearance: 19 mL/min — ABNORMAL LOW (ref 75–125)
Creatinine, Urine: 78.38 mg/dL

## 2012-02-16 LAB — BASIC METABOLIC PANEL
CO2: 29 mEq/L (ref 19–32)
CO2: 27 mEq/L (ref 19–32)
Calcium: 7.9 mg/dL — ABNORMAL LOW (ref 8.4–10.5)
Chloride: 87 mEq/L — ABNORMAL LOW (ref 96–112)
Chloride: 88 mEq/L — ABNORMAL LOW (ref 96–112)
GFR calc Af Amer: 44 mL/min — ABNORMAL LOW (ref >90)
Glucose, Bld: 159 mg/dL — ABNORMAL HIGH (ref 70–99)
Potassium: 3.9 mEq/L (ref 3.5–5.1)
Potassium: 4.1 mEq/L (ref 3.5–5.1)
Sodium: 125 mEq/L — ABNORMAL LOW (ref 135–145)

## 2012-02-17 LAB — BASIC METABOLIC PANEL
BUN: 84 mg/dL — ABNORMAL HIGH (ref 6–23)
BUN: 82 mg/dL — ABNORMAL HIGH (ref 6–23)
CO2: 28 mEq/L (ref 19–32)
CO2: 26 mEq/L (ref 19–32)
Calcium: 8.1 mg/dL — ABNORMAL LOW (ref 8.4–10.5)
Chloride: 89 mEq/L — ABNORMAL LOW (ref 96–112)
Chloride: 87 mEq/L — ABNORMAL LOW (ref 96–112)
Creatinine, Ser: 1.99 mg/dL — ABNORMAL HIGH (ref 0.50–1.35)
Creatinine, Ser: 2.02 mg/dL — ABNORMAL HIGH (ref 0.50–1.35)
GFR calc non Af Amer: 34 mL/min — ABNORMAL LOW (ref >90)
Glucose, Bld: 147 mg/dL — ABNORMAL HIGH (ref 70–99)
Glucose, Bld: 153 mg/dL — ABNORMAL HIGH (ref 70–99)
Glucose, Bld: 141 mg/dL — ABNORMAL HIGH (ref 70–99)
Potassium: 4.6 mEq/L (ref 3.5–5.1)
Sodium: 125 mEq/L — ABNORMAL LOW (ref 135–145)

## 2012-02-17 LAB — CBC
HCT: 29.6 % — ABNORMAL LOW (ref 39.0–52.0)
Hemoglobin: 10.2 g/dL — ABNORMAL LOW (ref 13.0–17.0)
MCH: 31 pg (ref 26.0–34.0)
MCV: 90 fL (ref 78.0–100.0)
Platelets: 104 10*3/uL — ABNORMAL LOW (ref 150–400)
RBC: 3.29 MIL/uL — ABNORMAL LOW (ref 4.22–5.81)
WBC: 9.3 10*3/uL (ref 4.0–10.5)

## 2012-02-18 LAB — BASIC METABOLIC PANEL WITH GFR
BUN: 96 mg/dL — ABNORMAL HIGH (ref 6–23)
CO2: 23 meq/L (ref 19–32)
Calcium: 7.8 mg/dL — ABNORMAL LOW (ref 8.4–10.5)
Chloride: 88 meq/L — ABNORMAL LOW (ref 96–112)
Creatinine, Ser: 1.87 mg/dL — ABNORMAL HIGH (ref 0.50–1.35)
GFR calc Af Amer: 43 mL/min — ABNORMAL LOW
GFR calc non Af Amer: 37 mL/min — ABNORMAL LOW
Glucose, Bld: 210 mg/dL — ABNORMAL HIGH (ref 70–99)
Potassium: 4.6 meq/L (ref 3.5–5.1)
Sodium: 120 meq/L — ABNORMAL LOW (ref 135–145)

## 2012-02-19 LAB — BASIC METABOLIC PANEL
CO2: 25 mEq/L (ref 19–32)
Calcium: 8 mg/dL — ABNORMAL LOW (ref 8.4–10.5)
Creatinine, Ser: 1.9 mg/dL — ABNORMAL HIGH (ref 0.50–1.35)
Glucose, Bld: 115 mg/dL — ABNORMAL HIGH (ref 70–99)

## 2012-02-20 LAB — CBC
HCT: 33.2 % — ABNORMAL LOW (ref 39.0–52.0)
Hemoglobin: 11.6 g/dL — ABNORMAL LOW (ref 13.0–17.0)
MCH: 31.6 pg (ref 26.0–34.0)
MCV: 90.5 fL (ref 78.0–100.0)
RBC: 3.67 MIL/uL — ABNORMAL LOW (ref 4.22–5.81)

## 2012-02-20 LAB — BASIC METABOLIC PANEL
CO2: 23 mEq/L (ref 19–32)
Calcium: 8.2 mg/dL — ABNORMAL LOW (ref 8.4–10.5)
Creatinine, Ser: 1.67 mg/dL — ABNORMAL HIGH (ref 0.50–1.35)
Glucose, Bld: 63 mg/dL — ABNORMAL LOW (ref 70–99)
Sodium: 130 mEq/L — ABNORMAL LOW (ref 135–145)

## 2012-02-21 LAB — BASIC METABOLIC PANEL
BUN: 95 mg/dL — ABNORMAL HIGH (ref 6–23)
GFR calc non Af Amer: 42 mL/min — ABNORMAL LOW (ref >90)
Glucose, Bld: 72 mg/dL (ref 70–99)
Potassium: 4.8 mEq/L (ref 3.5–5.1)

## 2012-02-22 ENCOUNTER — Other Ambulatory Visit (HOSPITAL_COMMUNITY): Payer: Self-pay

## 2012-02-22 LAB — BASIC METABOLIC PANEL
BUN: 98 mg/dL — ABNORMAL HIGH (ref 6–23)
Chloride: 101 mEq/L (ref 96–112)
Creatinine, Ser: 1.89 mg/dL — ABNORMAL HIGH (ref 0.50–1.35)
Glucose, Bld: 143 mg/dL — ABNORMAL HIGH (ref 70–99)
Potassium: 4.7 mEq/L (ref 3.5–5.1)

## 2012-02-22 LAB — CBC
Hemoglobin: 12 g/dL — ABNORMAL LOW (ref 13.0–17.0)
MCH: 31.7 pg (ref 26.0–34.0)
MCHC: 34.3 g/dL (ref 30.0–36.0)

## 2012-02-22 LAB — DIFFERENTIAL
Basophils Relative: 0 % (ref 0–1)
Eosinophils Absolute: 0.2 10*3/uL (ref 0.0–0.7)
Eosinophils Relative: 2 % (ref 0–5)
Monocytes Absolute: 0.4 10*3/uL (ref 0.1–1.0)
Monocytes Relative: 6 % (ref 3–12)
Neutrophils Relative %: 80 % — ABNORMAL HIGH (ref 43–77)

## 2012-02-23 ENCOUNTER — Telehealth: Payer: Self-pay | Admitting: Gastroenterology

## 2012-02-23 ENCOUNTER — Other Ambulatory Visit (HOSPITAL_COMMUNITY): Payer: Self-pay

## 2012-02-23 LAB — BLOOD GAS, ARTERIAL
Acid-base deficit: 4.5 mmol/L — ABNORMAL HIGH (ref 0.0–2.0)
Bicarbonate: 19 mEq/L — ABNORMAL LOW (ref 20.0–24.0)
O2 Saturation: 97.5 %
Patient temperature: 98.6
TCO2: 19.9 mmol/L (ref 0–100)
pH, Arterial: 7.437 (ref 7.350–7.450)

## 2012-02-23 LAB — CBC
HCT: 38.2 % — ABNORMAL LOW (ref 39.0–52.0)
Hemoglobin: 13.2 g/dL (ref 13.0–17.0)
MCH: 31.9 pg (ref 26.0–34.0)
MCHC: 34.6 g/dL (ref 30.0–36.0)
MCV: 92.3 fL (ref 78.0–100.0)

## 2012-02-23 LAB — ALBUMIN: Albumin: 1.5 g/dL — ABNORMAL LOW (ref 3.5–5.2)

## 2012-02-23 LAB — BASIC METABOLIC PANEL
BUN: 113 mg/dL — ABNORMAL HIGH (ref 6–23)
CO2: 19 mEq/L (ref 19–32)
Chloride: 104 mEq/L (ref 96–112)
Creatinine, Ser: 2.27 mg/dL — ABNORMAL HIGH (ref 0.50–1.35)
Glucose, Bld: 185 mg/dL — ABNORMAL HIGH (ref 70–99)
Potassium: 4.6 mEq/L (ref 3.5–5.1)

## 2012-02-23 LAB — APTT: aPTT: 34 seconds (ref 24–37)

## 2012-02-23 MED ORDER — IOHEXOL 300 MG/ML  SOLN: 20.0000 mL | INTRAMUSCULAR | Status: AC

## 2012-02-23 NOTE — Telephone Encounter (Signed)
Pt's wife has been notified.   

## 2012-02-23 NOTE — Telephone Encounter (Signed)
The wife has called and said that the pt has been unresponsive for the past 24-48 hours and would like Dr Christella Hartigan to look at his labs and give any recommendations he may have if he can.  The records are in [facility]

## 2012-02-23 NOTE — Telephone Encounter (Signed)
i reviewed labs.  Cannot see any progress notes for past 12 days however so it is not possible to put the labs in clinical perspective.  If needed the inpatient team can be called in to evaluate him.  I am sorry I cannot really help more.

## 2012-02-24 ENCOUNTER — Other Ambulatory Visit (HOSPITAL_COMMUNITY): Payer: Self-pay

## 2012-02-24 LAB — URINALYSIS, ROUTINE W REFLEX MICROSCOPIC
Bilirubin Urine: NEGATIVE
Glucose, UA: NEGATIVE mg/dL
Ketones, ur: NEGATIVE mg/dL
Nitrite: NEGATIVE
Specific Gravity, Urine: 1.02 (ref 1.005–1.030)
pH: 5 (ref 5.0–8.0)

## 2012-02-24 LAB — CBC
HCT: 35.3 % — ABNORMAL LOW (ref 39.0–52.0)
MCH: 31.9 pg (ref 26.0–34.0)
MCHC: 34.3 g/dL (ref 30.0–36.0)
MCV: 93.1 fL (ref 78.0–100.0)
Platelets: 120 10*3/uL — ABNORMAL LOW (ref 150–400)
RDW: 16.5 % — ABNORMAL HIGH (ref 11.5–15.5)
WBC: 9.7 10*3/uL (ref 4.0–10.5)

## 2012-02-24 LAB — URINE MICROSCOPIC-ADD ON

## 2012-02-24 LAB — BASIC METABOLIC PANEL
BUN: 122 mg/dL — ABNORMAL HIGH (ref 6–23)
Calcium: 9.1 mg/dL (ref 8.4–10.5)
Creatinine, Ser: 2.72 mg/dL — ABNORMAL HIGH (ref 0.50–1.35)
GFR calc Af Amer: 27 mL/min — ABNORMAL LOW (ref >90)
GFR calc non Af Amer: 24 mL/min — ABNORMAL LOW (ref >90)

## 2012-02-24 LAB — HEPATIC FUNCTION PANEL
Bilirubin, Direct: 0.6 mg/dL — ABNORMAL HIGH (ref 0.0–0.3)
Indirect Bilirubin: 0.5 mg/dL (ref 0.3–0.9)
Total Bilirubin: 1.1 mg/dL (ref 0.3–1.2)

## 2012-02-24 LAB — MAGNESIUM: Magnesium: 2.8 mg/dL — ABNORMAL HIGH (ref 1.5–2.5)

## 2012-02-25 ENCOUNTER — Other Ambulatory Visit (HOSPITAL_COMMUNITY): Payer: Self-pay

## 2012-02-25 LAB — BASIC METABOLIC PANEL
BUN: 128 mg/dL — ABNORMAL HIGH (ref 6–23)
CO2: 17 mEq/L — ABNORMAL LOW (ref 19–32)
Calcium: 9.6 mg/dL (ref 8.4–10.5)
Chloride: 110 mEq/L (ref 96–112)
Creatinine, Ser: 3.26 mg/dL — ABNORMAL HIGH (ref 0.50–1.35)

## 2012-02-25 LAB — AMMONIA: Ammonia: 51 umol/L (ref 11–60)

## 2012-02-25 LAB — URINE CULTURE: Culture  Setup Time: 201303290627

## 2012-02-25 LAB — SEDIMENTATION RATE: Sed Rate: 26 mm/hr — ABNORMAL HIGH (ref 0–16)

## 2012-02-25 LAB — PROTIME-INR: Prothrombin Time: 23.4 seconds — ABNORMAL HIGH (ref 11.6–15.2)

## 2012-02-26 ENCOUNTER — Other Ambulatory Visit (HOSPITAL_COMMUNITY): Payer: Self-pay

## 2012-02-26 LAB — PREPARE FRESH FROZEN PLASMA: Unit division: 0

## 2012-02-26 LAB — BASIC METABOLIC PANEL
BUN: 118 mg/dL — ABNORMAL HIGH (ref 6–23)
Calcium: 8.6 mg/dL (ref 8.4–10.5)
Chloride: 108 mEq/L (ref 96–112)
Creatinine, Ser: 3.16 mg/dL — ABNORMAL HIGH (ref 0.50–1.35)
GFR calc Af Amer: 23 mL/min — ABNORMAL LOW (ref >90)

## 2012-02-27 ENCOUNTER — Other Ambulatory Visit (HOSPITAL_COMMUNITY): Payer: Self-pay

## 2012-02-27 LAB — COMPREHENSIVE METABOLIC PANEL
ALT: 55 U/L — ABNORMAL HIGH (ref 0–53)
AST: 62 U/L — ABNORMAL HIGH (ref 0–37)
Albumin: 1.3 g/dL — ABNORMAL LOW (ref 3.5–5.2)
Alkaline Phosphatase: 117 U/L (ref 39–117)
BUN: 110 mg/dL — ABNORMAL HIGH (ref 6–23)
CO2: 15 mEq/L — ABNORMAL LOW (ref 19–32)
Calcium: 7.8 mg/dL — ABNORMAL LOW (ref 8.4–10.5)
Creatinine, Ser: 2.96 mg/dL — ABNORMAL HIGH (ref 0.50–1.35)
GFR calc Af Amer: 25 mL/min — ABNORMAL LOW (ref >90)
GFR calc non Af Amer: 21 mL/min — ABNORMAL LOW (ref >90)
Glucose, Bld: 182 mg/dL — ABNORMAL HIGH (ref 70–99)
Sodium: 136 mEq/L (ref 135–145)
Total Bilirubin: 0.7 mg/dL (ref 0.3–1.2)
Total Protein: 4.1 g/dL — ABNORMAL LOW (ref 6.0–8.3)

## 2012-02-27 LAB — MAGNESIUM: Magnesium: 2.4 mg/dL (ref 1.5–2.5)

## 2012-02-27 LAB — DIFFERENTIAL
Basophils Absolute: 0 10*3/uL (ref 0.0–0.1)
Basophils Relative: 0 % (ref 0–1)
Eosinophils Absolute: 0.3 10*3/uL (ref 0.0–0.7)
Monocytes Absolute: 0.5 10*3/uL (ref 0.1–1.0)
Neutro Abs: 6.2 10*3/uL (ref 1.7–7.7)

## 2012-02-27 LAB — BODY FLUID CULTURE

## 2012-02-27 LAB — CBC
HCT: 28.9 % — ABNORMAL LOW (ref 39.0–52.0)
MCHC: 34.3 g/dL (ref 30.0–36.0)
RDW: 16.3 % — ABNORMAL HIGH (ref 11.5–15.5)

## 2012-02-28 ENCOUNTER — Telehealth: Payer: Self-pay | Admitting: Gastroenterology

## 2012-02-28 LAB — TYPE AND SCREEN
Antibody Screen: NEGATIVE
Unit division: 0

## 2012-02-28 NOTE — Telephone Encounter (Signed)
Hospice called and want to know if you are in agreement with hospice care and would you be willing to the attending?  Please advise

## 2012-02-29 LAB — CULTURE, BLOOD (ROUTINE X 2)
Culture  Setup Time: 201303281604
Culture: NO GROWTH

## 2012-03-02 NOTE — Telephone Encounter (Signed)
Pt did go home from the hospital with hospice care and his PCP is the attending.

## 2012-03-02 NOTE — Telephone Encounter (Signed)
I think hospice is reasonable, however I would not be the attending of record for that.

## 2012-03-16 ENCOUNTER — Telehealth: Payer: Self-pay | Admitting: Cardiovascular Disease

## 2012-03-16 NOTE — Telephone Encounter (Signed)
Pt had myoview and OV scheduled on 03/19/12 with Dr Excell Seltzer.

## 2012-03-16 NOTE — Telephone Encounter (Signed)
FYI: Patient Deceased (Patient Wife Nolon Yellin called to report patient date of death 2012/03/14)

## 2012-03-19 ENCOUNTER — Other Ambulatory Visit (HOSPITAL_COMMUNITY): Payer: 59

## 2012-03-19 ENCOUNTER — Ambulatory Visit: Payer: 59 | Admitting: Cardiovascular Disease

## 2012-03-28 DEATH — deceased

## 2012-08-22 ENCOUNTER — Ambulatory Visit: Payer: 59 | Admitting: Pulmonary Disease

## 2013-03-04 IMAGING — CR DG CHEST 1V PORT
1 series · 1 of 1 positions shown · non-contrast
Comparison: Chest CT 03/15/2005.

CLINICAL DATA: Weakness and shortness of breath.

PORTABLE CHEST - 1 VIEW

[view not recorded]
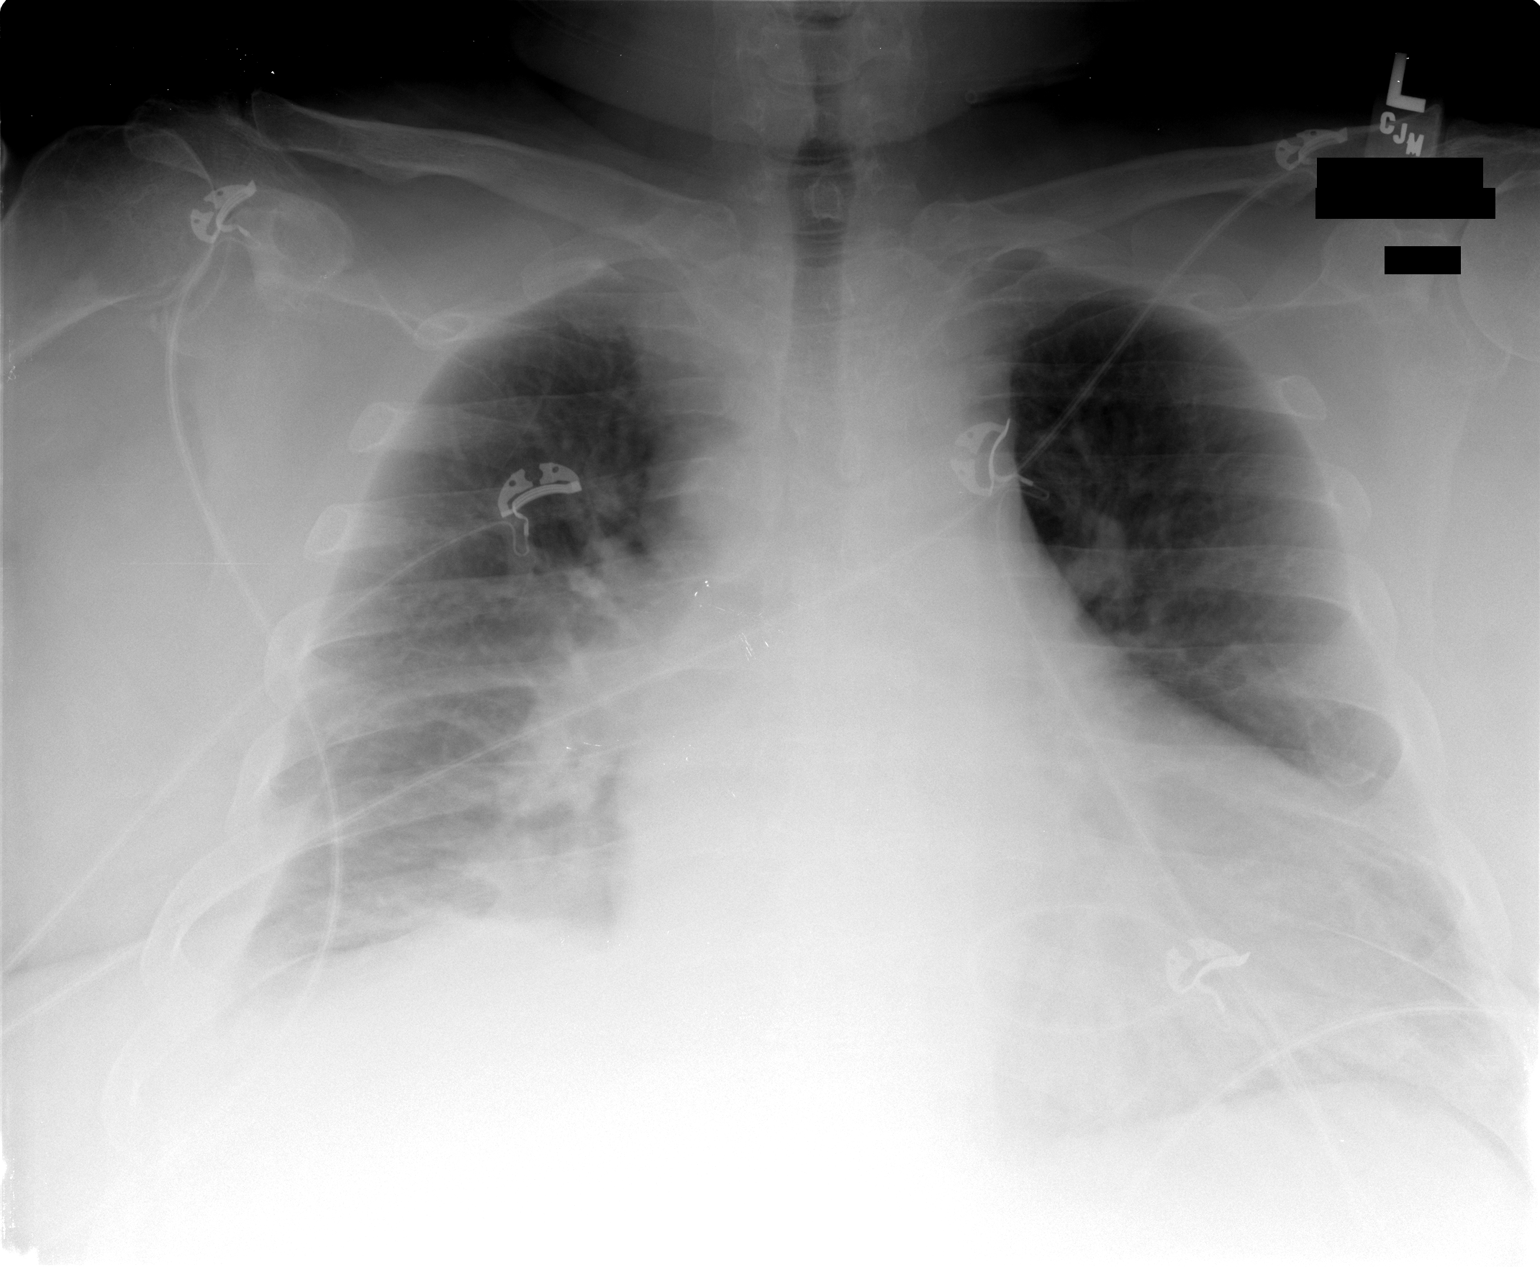

[1 of 1 positions shown; findings below may reference images not displayed]

FINDINGS: The heart is enlarged.  The mediastinal and hilar
contours are prominent but appears stable.  There is central
vascular congestion without overt pulmonary edema.  There is a
small right pleural effusion and streaky bibasilar atelectasis.  No
pneumothorax.  The bony thorax is intact.
IMPRESSION: 1.  Cardiac enlargement and vascular congestion without overt
pulmonary edema.
2.  Right-sided pleural effusion and bibasilar atelectasis.

## 2013-10-30 IMAGING — CR DG CHEST 1V PORT
1 series · 1 of 1 positions shown · non-contrast
Comparison: 01/20/2012

CLINICAL DATA: Verified PICC line placement.

PORTABLE CHEST - 1 VIEW

[view not recorded]
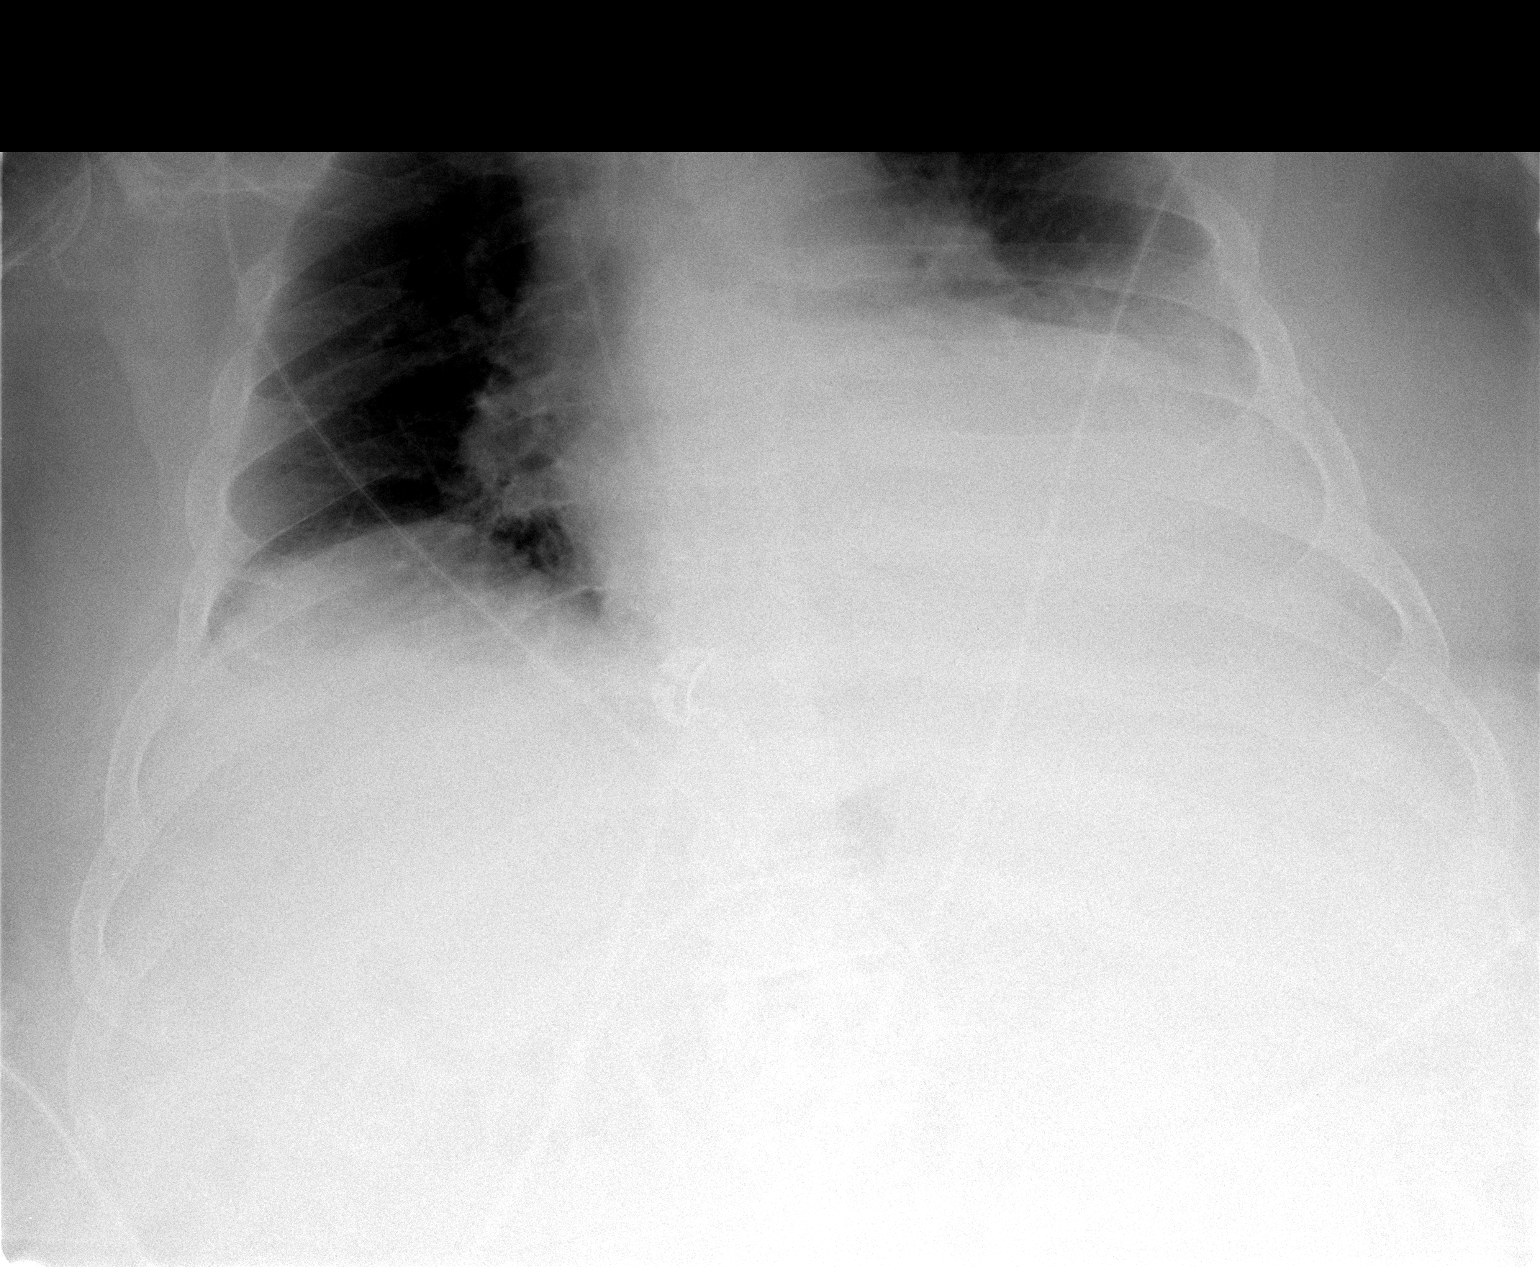

[1 of 1 positions shown; findings below may reference images not displayed]

FINDINGS: The right arm PICC line tip is poorly characterized due
to body habitus and overlying spine.  The PICC line tip appears to
be in the region of the right atrium.  Again noted is opacification
and at the left lung base probably related to consolidation and/or
pleural fluid.  Upper lungs are clear.  The trachea is midline.
IMPRESSION: Limited evaluation of the PICC line tip.  The tip is probably in
the region of the right atrium.

Stable opacification at the left lung base suggestive for
consolidation and pleural fluid.

## 2013-10-31 IMAGING — CT CT ABD-PELV W/ CM
2 of 5 series · 17 of 46 positions shown, 19 images · IV contrast (APPLIED)
Comparison: CT of the abdomen pelvis 01/21/2012

CLINICAL DATA: Large perinephric and retroperitoneal hematoma.
History of cirrhosis, ascites.  Persistent severe pain.

CT ABDOMEN AND PELVIS WITH CONTRAST
TECHNIQUE: Multidetector CT imaging of the abdomen and pelvis was
performed following the standard protocol during bolus
administration of intravenous contrast.
Contrast: 80mL OMNIPAQUE IOHEXOL 300 MG/ML IV SOLN

[Series 2: abd_pel 5.0 b40s · axial · 0.94mm/px · z∈[-545,-105]mm · 14 of 100 slices shown, 16 images]
[im 6/100  soft-tissue]
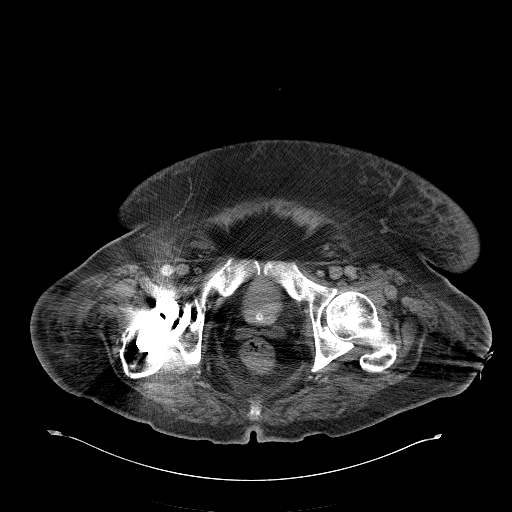
[im 6/100  bone]
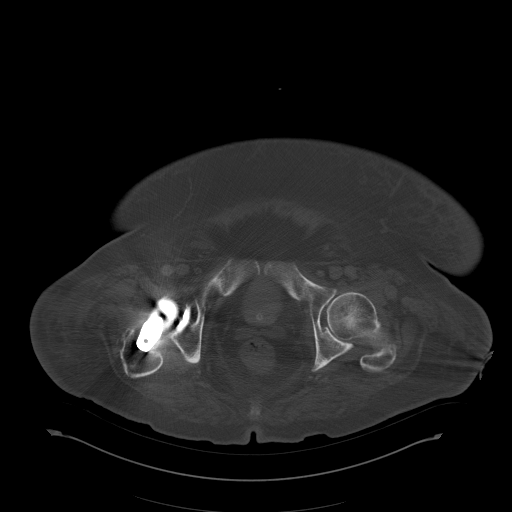
[im 12/100  soft-tissue]
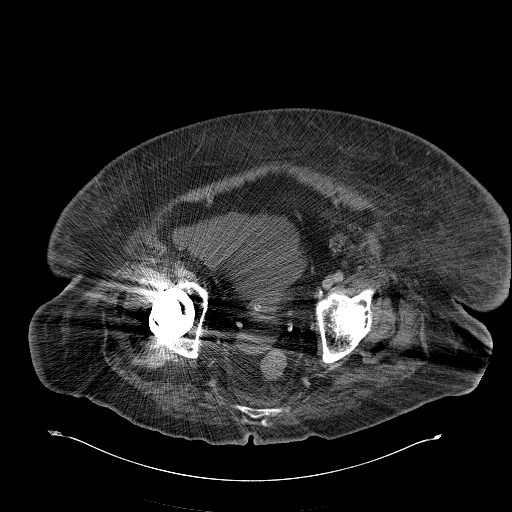
[im 23/100  soft-tissue]
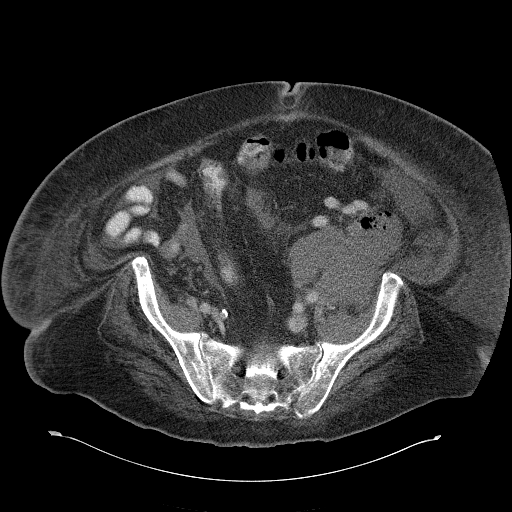
[im 28/100  soft-tissue]
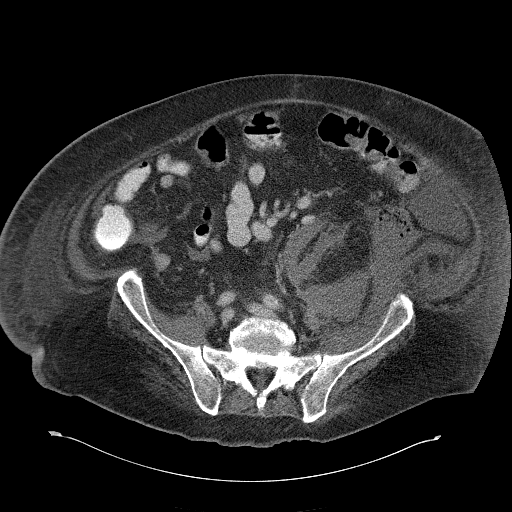
[im 34/100  soft-tissue]
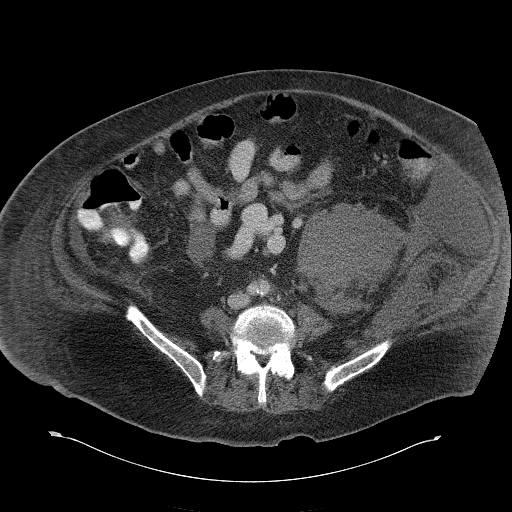
[im 39/100  soft-tissue]
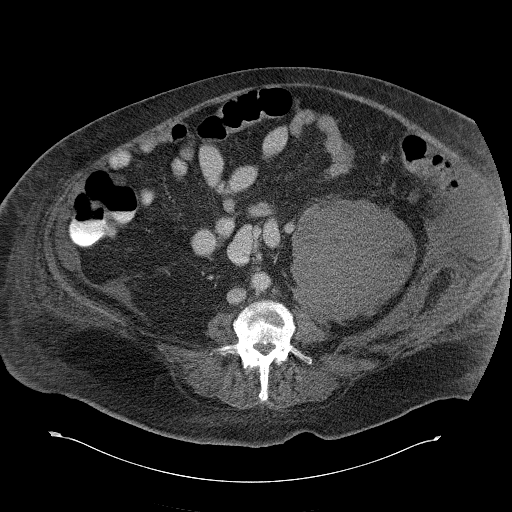
[im 45/100  soft-tissue]
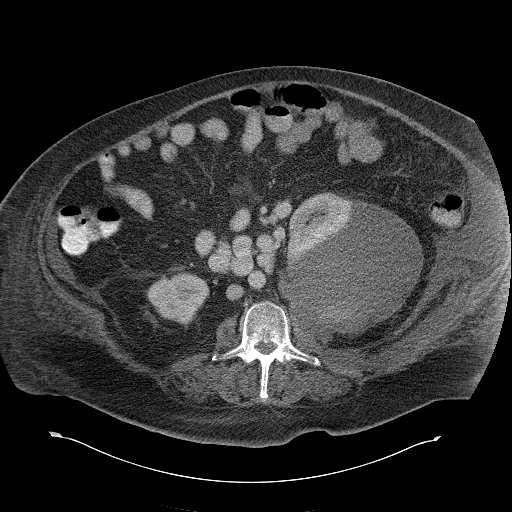
[im 56/100  soft-tissue]
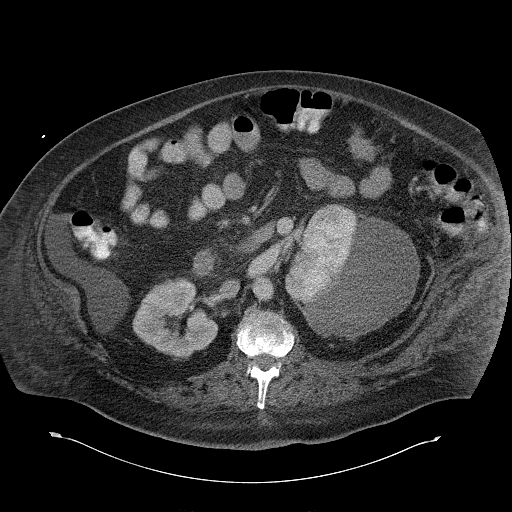
[im 61/100  soft-tissue]
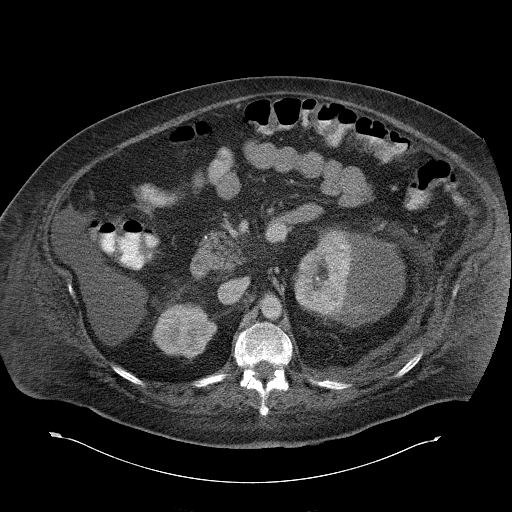
[im 61/100  bone]
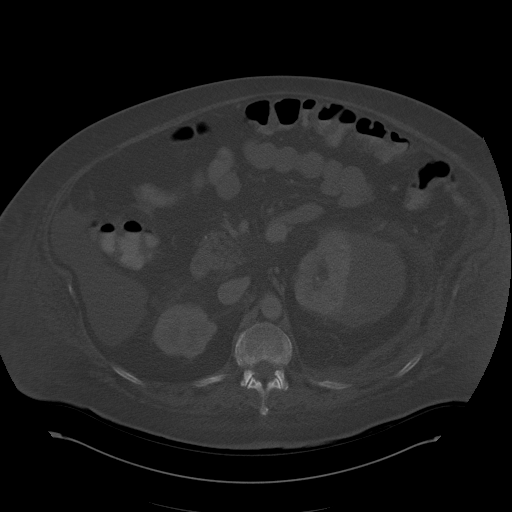
[im 67/100  soft-tissue]
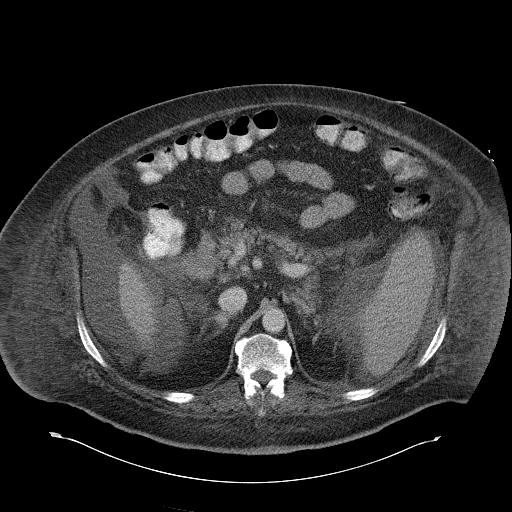
[im 72/100  soft-tissue]
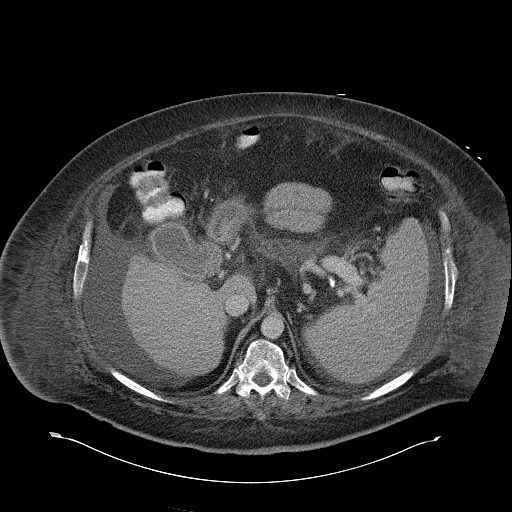
[im 78/100  soft-tissue]
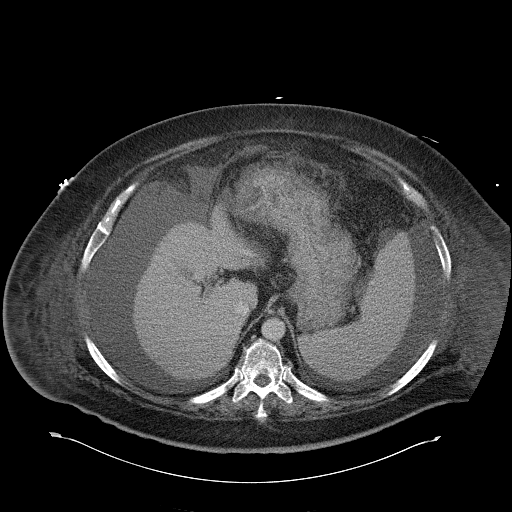
[im 89/100  soft-tissue]
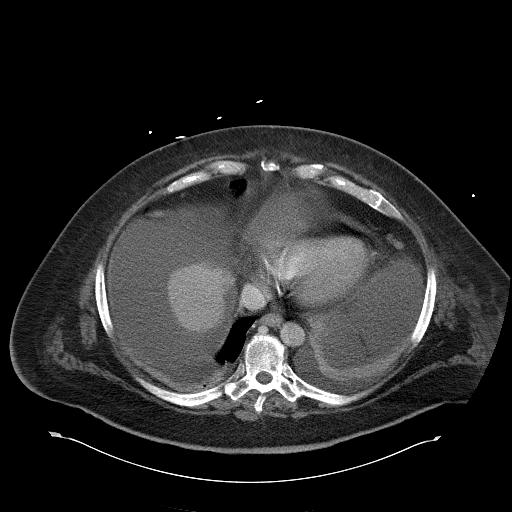
[im 94/100  soft-tissue]
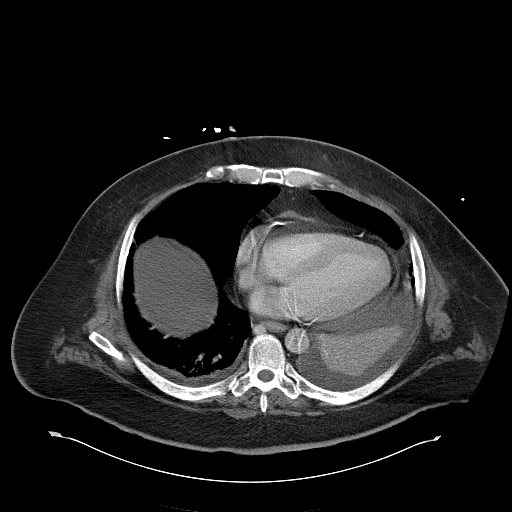

[Series 602: <mpr thick range> · coronal · 0.97mm/px · 3 of 118 slices shown]
[im 40/118  soft-tissue]
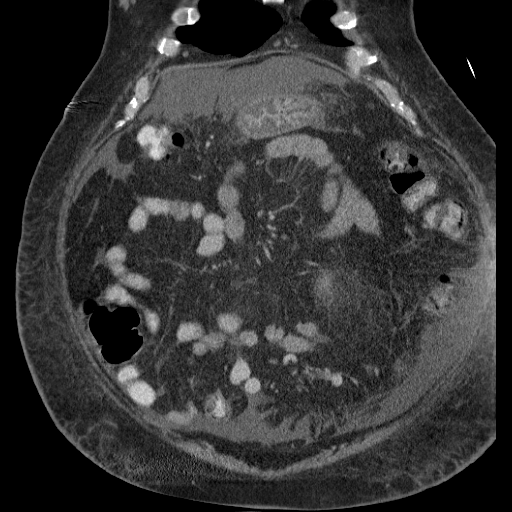
[im 53/118  soft-tissue]
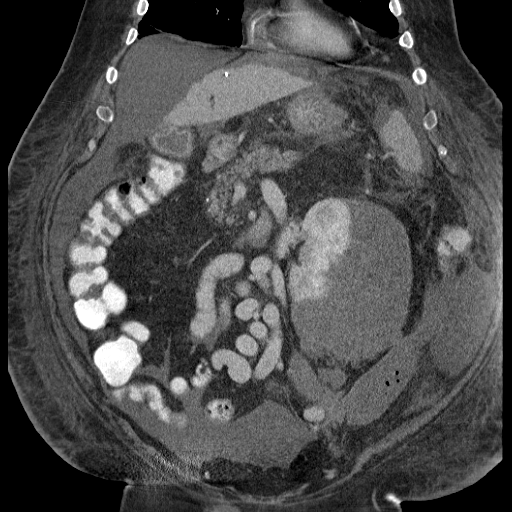
[im 66/118  soft-tissue]
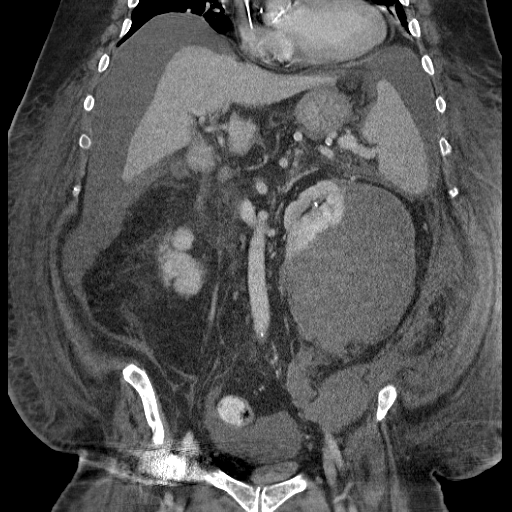

[17 of 46 positions shown; findings below may reference images not displayed]

FINDINGS: There are bilateral pleural effusions.  Left base
consolidation/atelectasis again noted.

There is moderate ascites, slightly increased since previous exam.
The contour of the liver is scalloped, consistent with history of
cirrhosis.  Spleen is enlarged, consistent with portal venous
hypertension.  Large splenorenal shunt again noted.

Again noted is perinephric hematoma on the left, measuring 13.4 by
10.1 cm.  This represents stability since the previous exam.  There
is diffuse body wall edema, probably increased since the previous
exam.  The right kidney has a normal appearance.  There is
stranding surrounding the gallbladder.  There is layering debris
within the gallbladder, consistent with layering sludge or stones.

The rugal fold pattern of the stomach appears prominent.  No
evidence for bowel obstruction.  A Foley catheter is identified
within the urinary bladder.  No evidence for abdominal aortic
aneurysm or retroperitoneal adenopathy.

Within the pelvis, retroperitoneal collection of fluid is noted.
This contains loculated gas, consistent with fistula, abscess, or
prior procedure.  The appearance is similar to previous study.
IMPRESSION: 1.  Stable perinephric/subcapsular hematoma on the left.
2.  Stable retroperitoneal left pelvic collection, containing
locules of gas.
3.  Cirrhosis, ascites.  Ascites has increased slightly.
4.  Large splenorenal shunt.

## 2014-06-12 NOTE — Telephone Encounter (Signed)
Close Encounter
